# Patient Record
Sex: Male | Born: 1950 | Race: White | Hispanic: No
Health system: Southern US, Community
[De-identification: ages and names within clinical notes are randomized; demographics above are authoritative.]

## PROBLEM LIST (undated history)

## (undated) DIAGNOSIS — E7800 Pure hypercholesterolemia, unspecified: Secondary | ICD-10-CM

## (undated) DIAGNOSIS — R252 Cramp and spasm: Secondary | ICD-10-CM

## (undated) DIAGNOSIS — I10 Essential (primary) hypertension: Secondary | ICD-10-CM

## (undated) DIAGNOSIS — F329 Major depressive disorder, single episode, unspecified: Secondary | ICD-10-CM

## (undated) DIAGNOSIS — J189 Pneumonia, unspecified organism: Secondary | ICD-10-CM

## (undated) DIAGNOSIS — F419 Anxiety disorder, unspecified: Secondary | ICD-10-CM

## (undated) DIAGNOSIS — K7460 Unspecified cirrhosis of liver: Secondary | ICD-10-CM

## (undated) DIAGNOSIS — F32A Depression, unspecified: Secondary | ICD-10-CM

## (undated) DIAGNOSIS — E119 Type 2 diabetes mellitus without complications: Secondary | ICD-10-CM

## (undated) DIAGNOSIS — K7581 Nonalcoholic steatohepatitis (NASH): Secondary | ICD-10-CM

## (undated) DIAGNOSIS — B159 Hepatitis A without hepatic coma: Secondary | ICD-10-CM

## (undated) DIAGNOSIS — N390 Urinary tract infection, site not specified: Secondary | ICD-10-CM

## (undated) HISTORY — PX: TONSILLECTOMY: SUR1361

## (undated) HISTORY — PX: CARDIAC CATHETERIZATION: SHX172

## (undated) HISTORY — PX: FIXATION KYPHOPLASTY LUMBAR SPINE: SHX1642

---

## 1969-07-20 DIAGNOSIS — B159 Hepatitis A without hepatic coma: Secondary | ICD-10-CM

## 1969-07-20 HISTORY — DX: Hepatitis a without hepatic coma: B15.9

## 1983-07-21 HISTORY — PX: VASECTOMY: SHX75

## 1998-09-27 ENCOUNTER — Ambulatory Visit (HOSPITAL_COMMUNITY): Admission: RE | Admit: 1998-09-27 | Discharge: 1998-09-27 | Payer: Self-pay | Admitting: Urology

## 2002-07-28 ENCOUNTER — Ambulatory Visit (HOSPITAL_COMMUNITY): Admission: RE | Admit: 2002-07-28 | Discharge: 2002-07-28 | Payer: Self-pay | Admitting: Cardiology

## 2002-08-03 ENCOUNTER — Ambulatory Visit (HOSPITAL_BASED_OUTPATIENT_CLINIC_OR_DEPARTMENT_OTHER): Admission: RE | Admit: 2002-08-03 | Discharge: 2002-08-03 | Payer: Self-pay | Admitting: Cardiology

## 2007-08-22 ENCOUNTER — Ambulatory Visit: Payer: Self-pay | Admitting: *Deleted

## 2007-11-09 ENCOUNTER — Ambulatory Visit: Payer: Self-pay | Admitting: Internal Medicine

## 2007-11-09 ENCOUNTER — Encounter (INDEPENDENT_AMBULATORY_CARE_PROVIDER_SITE_OTHER): Payer: Self-pay | Admitting: Family Medicine

## 2007-11-09 LAB — CONVERTED CEMR LAB
ALT: 32 units/L (ref 0–53)
AST: 30 units/L (ref 0–37)
Calcium: 9.3 mg/dL (ref 8.4–10.5)
Chloride: 107 meq/L (ref 96–112)
Creatinine, Ser: 0.95 mg/dL (ref 0.40–1.50)
PSA: 0.24 ng/mL (ref 0.10–4.00)
Sodium: 140 meq/L (ref 135–145)
TSH: 1.34 microintl units/mL (ref 0.350–5.50)
Total CHOL/HDL Ratio: 4.7
VLDL: 20 mg/dL (ref 0–40)

## 2007-12-07 ENCOUNTER — Ambulatory Visit: Payer: Self-pay | Admitting: Internal Medicine

## 2008-01-18 ENCOUNTER — Encounter (INDEPENDENT_AMBULATORY_CARE_PROVIDER_SITE_OTHER): Payer: Self-pay | Admitting: Family Medicine

## 2008-01-18 ENCOUNTER — Ambulatory Visit: Payer: Self-pay | Admitting: Internal Medicine

## 2008-01-18 LAB — CONVERTED CEMR LAB
ALT: 36 units/L (ref 0–53)
AST: 32 units/L (ref 0–37)
Chloride: 109 meq/L (ref 96–112)
Creatinine, Ser: 1.12 mg/dL (ref 0.40–1.50)
LDL Cholesterol: 108 mg/dL — ABNORMAL HIGH (ref 0–99)
Total Bilirubin: 0.7 mg/dL (ref 0.3–1.2)
Total CHOL/HDL Ratio: 4.2
VLDL: 23 mg/dL (ref 0–40)

## 2008-03-12 ENCOUNTER — Ambulatory Visit: Payer: Self-pay | Admitting: Internal Medicine

## 2008-03-13 ENCOUNTER — Ambulatory Visit: Payer: Self-pay | Admitting: Internal Medicine

## 2008-03-13 ENCOUNTER — Encounter (INDEPENDENT_AMBULATORY_CARE_PROVIDER_SITE_OTHER): Payer: Self-pay | Admitting: Family Medicine

## 2008-03-13 LAB — CONVERTED CEMR LAB: Microalb, Ur: 2.29 mg/dL — ABNORMAL HIGH (ref 0.00–1.89)

## 2008-04-18 ENCOUNTER — Encounter (INDEPENDENT_AMBULATORY_CARE_PROVIDER_SITE_OTHER): Payer: Self-pay | Admitting: Family Medicine

## 2008-04-18 ENCOUNTER — Ambulatory Visit: Payer: Self-pay | Admitting: Internal Medicine

## 2008-04-18 LAB — CONVERTED CEMR LAB
Albumin: 4.9 g/dL (ref 3.5–5.2)
Alkaline Phosphatase: 74 units/L (ref 39–117)
CO2: 21 meq/L (ref 19–32)
Eosinophils Absolute: 0.2 10*3/uL (ref 0.0–0.7)
Glucose, Bld: 122 mg/dL — ABNORMAL HIGH (ref 70–99)
LDL Cholesterol: 165 mg/dL — ABNORMAL HIGH (ref 0–99)
Lymphocytes Relative: 26 % (ref 12–46)
Lymphs Abs: 1.5 10*3/uL (ref 0.7–4.0)
Microalb, Ur: 2.39 mg/dL — ABNORMAL HIGH (ref 0.00–1.89)
Neutrophils Relative %: 60 % (ref 43–77)
PSA: 0.38 ng/mL (ref 0.10–4.00)
Platelets: 112 10*3/uL — ABNORMAL LOW (ref 150–400)
Potassium: 4.8 meq/L (ref 3.5–5.3)
Sodium: 137 meq/L (ref 135–145)
Total Protein: 8.1 g/dL (ref 6.0–8.3)
Triglycerides: 209 mg/dL — ABNORMAL HIGH (ref <150)
WBC: 5.6 10*3/uL (ref 4.0–10.5)

## 2008-04-19 ENCOUNTER — Encounter (INDEPENDENT_AMBULATORY_CARE_PROVIDER_SITE_OTHER): Payer: Self-pay | Admitting: Family Medicine

## 2008-04-19 LAB — CONVERTED CEMR LAB: Free T4: 1.25 ng/dL (ref 0.89–1.80)

## 2008-04-23 ENCOUNTER — Ambulatory Visit: Payer: Self-pay | Admitting: Internal Medicine

## 2008-05-22 ENCOUNTER — Ambulatory Visit: Payer: Self-pay | Admitting: Internal Medicine

## 2008-08-13 ENCOUNTER — Ambulatory Visit: Payer: Self-pay | Admitting: Internal Medicine

## 2008-08-29 ENCOUNTER — Encounter (INDEPENDENT_AMBULATORY_CARE_PROVIDER_SITE_OTHER): Payer: Self-pay | Admitting: Adult Health

## 2008-08-29 ENCOUNTER — Ambulatory Visit: Payer: Self-pay | Admitting: Internal Medicine

## 2008-08-29 LAB — CONVERTED CEMR LAB
ALT: 27 units/L (ref 0–53)
AST: 30 units/L (ref 0–37)
Alkaline Phosphatase: 81 units/L (ref 39–117)
Calcium: 9.3 mg/dL (ref 8.4–10.5)
Chloride: 103 meq/L (ref 96–112)
Creatinine, Ser: 1.74 mg/dL — ABNORMAL HIGH (ref 0.40–1.50)
LDL Cholesterol: 107 mg/dL — ABNORMAL HIGH (ref 0–99)
T3 Uptake Ratio: 36.6 % (ref 22.5–37.0)
T4, Total: 5.4 ug/dL (ref 5.0–12.5)
Total Bilirubin: 1 mg/dL (ref 0.3–1.2)
Total CHOL/HDL Ratio: 3.9
VLDL: 28 mg/dL (ref 0–40)

## 2008-09-10 ENCOUNTER — Ambulatory Visit: Payer: Self-pay | Admitting: Internal Medicine

## 2008-09-10 ENCOUNTER — Encounter (INDEPENDENT_AMBULATORY_CARE_PROVIDER_SITE_OTHER): Payer: Self-pay | Admitting: Adult Health

## 2008-09-10 LAB — CONVERTED CEMR LAB
BUN: 23 mg/dL (ref 6–23)
CO2: 21 meq/L (ref 19–32)
Calcium: 9 mg/dL (ref 8.4–10.5)
Chloride: 104 meq/L (ref 96–112)
Creatinine, Ser: 1.27 mg/dL (ref 0.40–1.50)
Glucose, Bld: 128 mg/dL — ABNORMAL HIGH (ref 70–99)
Potassium: 5.2 meq/L (ref 3.5–5.3)
Sodium: 141 meq/L (ref 135–145)

## 2008-12-09 ENCOUNTER — Emergency Department (HOSPITAL_COMMUNITY): Admission: EM | Admit: 2008-12-09 | Discharge: 2008-12-09 | Payer: Self-pay | Admitting: Emergency Medicine

## 2008-12-11 ENCOUNTER — Ambulatory Visit: Payer: Self-pay | Admitting: Internal Medicine

## 2008-12-25 ENCOUNTER — Ambulatory Visit: Payer: Self-pay | Admitting: Internal Medicine

## 2009-01-01 ENCOUNTER — Ambulatory Visit: Payer: Self-pay | Admitting: Internal Medicine

## 2009-01-20 ENCOUNTER — Emergency Department (HOSPITAL_COMMUNITY): Admission: EM | Admit: 2009-01-20 | Discharge: 2009-01-20 | Payer: Self-pay | Admitting: Emergency Medicine

## 2009-01-22 ENCOUNTER — Ambulatory Visit: Payer: Self-pay | Admitting: Internal Medicine

## 2009-01-24 ENCOUNTER — Ambulatory Visit: Payer: Self-pay | Admitting: Internal Medicine

## 2009-05-13 ENCOUNTER — Ambulatory Visit: Payer: Self-pay | Admitting: Internal Medicine

## 2009-11-13 ENCOUNTER — Ambulatory Visit: Payer: Self-pay | Admitting: Family Medicine

## 2009-11-13 ENCOUNTER — Encounter (INDEPENDENT_AMBULATORY_CARE_PROVIDER_SITE_OTHER): Payer: Self-pay | Admitting: Adult Health

## 2009-11-13 LAB — CONVERTED CEMR LAB
ALT: 19 units/L (ref 0–53)
Alkaline Phosphatase: 77 units/L (ref 39–117)
Creatinine, Ser: 1.21 mg/dL (ref 0.40–1.50)
LDL Cholesterol: 184 mg/dL — ABNORMAL HIGH (ref 0–99)
Microalb, Ur: 1 mg/dL (ref 0.00–1.89)
Sodium: 135 meq/L (ref 135–145)
Total Bilirubin: 0.7 mg/dL (ref 0.3–1.2)
Total CHOL/HDL Ratio: 6.7
Total Protein: 7.2 g/dL (ref 6.0–8.3)
Triglycerides: 211 mg/dL — ABNORMAL HIGH (ref <150)
VLDL: 42 mg/dL — ABNORMAL HIGH (ref 0–40)

## 2010-01-21 ENCOUNTER — Ambulatory Visit: Payer: Self-pay | Admitting: Internal Medicine

## 2010-10-26 LAB — POCT I-STAT, CHEM 8
BUN: 32 mg/dL — ABNORMAL HIGH (ref 6–23)
Calcium, Ion: 1.11 mmol/L — ABNORMAL LOW (ref 1.12–1.32)
Chloride: 111 mEq/L (ref 96–112)
Glucose, Bld: 97 mg/dL (ref 70–99)
TCO2: 20 mmol/L (ref 0–100)

## 2010-10-26 LAB — CK: Total CK: 401 U/L — ABNORMAL HIGH (ref 7–232)

## 2011-06-08 ENCOUNTER — Emergency Department (HOSPITAL_COMMUNITY): Payer: Self-pay

## 2011-06-08 ENCOUNTER — Emergency Department (HOSPITAL_COMMUNITY)
Admission: EM | Admit: 2011-06-08 | Discharge: 2011-06-08 | Disposition: A | Discharge status: Home / Self Care | Payer: Self-pay | Attending: Emergency Medicine | Admitting: Emergency Medicine

## 2011-06-08 ENCOUNTER — Encounter: Payer: Self-pay | Admitting: *Deleted

## 2011-06-08 DIAGNOSIS — R188 Other ascites: Secondary | ICD-10-CM | POA: Insufficient documentation

## 2011-06-08 DIAGNOSIS — M4854XA Collapsed vertebra, not elsewhere classified, thoracic region, initial encounter for fracture: Secondary | ICD-10-CM

## 2011-06-08 DIAGNOSIS — M8448XA Pathological fracture, other site, initial encounter for fracture: Secondary | ICD-10-CM | POA: Insufficient documentation

## 2011-06-08 DIAGNOSIS — N390 Urinary tract infection, site not specified: Secondary | ICD-10-CM | POA: Insufficient documentation

## 2011-06-08 DIAGNOSIS — I10 Essential (primary) hypertension: Secondary | ICD-10-CM | POA: Insufficient documentation

## 2011-06-08 DIAGNOSIS — K5730 Diverticulosis of large intestine without perforation or abscess without bleeding: Secondary | ICD-10-CM | POA: Insufficient documentation

## 2011-06-08 DIAGNOSIS — K4090 Unilateral inguinal hernia, without obstruction or gangrene, not specified as recurrent: Secondary | ICD-10-CM | POA: Insufficient documentation

## 2011-06-08 DIAGNOSIS — R161 Splenomegaly, not elsewhere classified: Secondary | ICD-10-CM | POA: Insufficient documentation

## 2011-06-08 DIAGNOSIS — K7460 Unspecified cirrhosis of liver: Secondary | ICD-10-CM | POA: Insufficient documentation

## 2011-06-08 HISTORY — DX: Pure hypercholesterolemia, unspecified: E78.00

## 2011-06-08 HISTORY — DX: Essential (primary) hypertension: I10

## 2011-06-08 LAB — CBC
HCT: 34.6 % — ABNORMAL LOW (ref 39.0–52.0)
Hemoglobin: 11.6 g/dL — ABNORMAL LOW (ref 13.0–17.0)
MCH: 29.7 pg (ref 26.0–34.0)
MCHC: 33.5 g/dL (ref 30.0–36.0)
MCV: 88.7 fL (ref 78.0–100.0)
RDW: 14.2 % (ref 11.5–15.5)

## 2011-06-08 LAB — POCT I-STAT, CHEM 8
Calcium, Ion: 1.18 mmol/L (ref 1.12–1.32)
Creatinine, Ser: 0.9 mg/dL (ref 0.50–1.35)
Glucose, Bld: 149 mg/dL — ABNORMAL HIGH (ref 70–99)
HCT: 36 % — ABNORMAL LOW (ref 39.0–52.0)
Hemoglobin: 12.2 g/dL — ABNORMAL LOW (ref 13.0–17.0)
TCO2: 24 mmol/L (ref 0–100)

## 2011-06-08 LAB — URINALYSIS, ROUTINE W REFLEX MICROSCOPIC
Glucose, UA: >1000 mg/dL — AB
Protein, ur: NEGATIVE mg/dL
pH: 5.5 (ref 5.0–8.0)

## 2011-06-08 LAB — HEPATIC FUNCTION PANEL
AST: 32 U/L (ref 0–37)
Bilirubin, Direct: 0.3 mg/dL (ref 0.0–0.3)
Indirect Bilirubin: 0.5 mg/dL (ref 0.3–0.9)
Total Bilirubin: 0.8 mg/dL (ref 0.3–1.2)

## 2011-06-08 LAB — URINE MICROSCOPIC-ADD ON

## 2011-06-08 MED ORDER — CIPROFLOXACIN HCL 500 MG PO TABS: 500.0000 mg | ORAL_TABLET | Freq: Two times a day (BID) | ORAL | Status: DC

## 2011-06-08 MED ORDER — DIAZEPAM 5 MG PO TABS: 5.0000 mg | ORAL_TABLET | Freq: Three times a day (TID) | ORAL | Status: AC | PRN

## 2011-06-08 MED ORDER — HYDROCODONE-ACETAMINOPHEN 5-500 MG PO TABS: 1.0000 | ORAL_TABLET | ORAL | Status: AC | PRN

## 2011-06-08 MED ORDER — DEXTROSE 5 % IV SOLN
1.0000 g | Freq: Once | INTRAVENOUS | Status: AC
  Administered 2011-06-08: 1 g via INTRAVENOUS
  Filled 2011-06-08: qty 10

## 2011-06-08 MED ORDER — DIAZEPAM 5 MG PO TABS
5.0000 mg | ORAL_TABLET | Freq: Once | ORAL | Status: AC
  Administered 2011-06-08: 5 mg via ORAL
  Filled 2011-06-08: qty 1

## 2011-06-08 MED ORDER — HYDROMORPHONE HCL PF 1 MG/ML IJ SOLN
1.0000 mg | Freq: Once | INTRAMUSCULAR | Status: AC
  Administered 2011-06-08: 1 mg via INTRAMUSCULAR

## 2011-06-08 MED ORDER — HYDROMORPHONE HCL PF 2 MG/ML IJ SOLN
INTRAMUSCULAR | Status: AC
  Filled 2011-06-08: qty 1

## 2011-06-08 NOTE — ED Notes (Signed)
Pt up got dressed and walking around with no problems.

## 2011-06-08 NOTE — ED Notes (Signed)
Pt waiting for PA.

## 2011-06-08 NOTE — ED Notes (Signed)
Patient transported to X-ray 

## 2011-06-08 NOTE — ED Provider Notes (Addendum)
History     CSN: [id number] Arrival date & time: 06/08/2011  1:17 PM   First MD Initiated Contact with Patient 06/08/11 1507      Chief Complaint  Patient presents with  . Back Pain    Pt reports feeling sharp pains in back that began last tuesday. Pt reports pain is best when laying flat on back. Pt denies exact or recent injury.     (Consider location/radiation/quality/duration/timing/severity/associated sxs/prior treatment) Patient is a 60 y.o. male presenting with back pain. The history is provided by the patient.  Back Pain  This is a new problem. The current episode started more than 2 days ago. The problem occurs constantly. The problem has been gradually worsening. The pain is associated with no known injury. The pain is present in the lumbar spine. The quality of the pain is described as stabbing. The pain does not radiate. The pain is at a severity of 9/10. The pain is moderate. The symptoms are aggravated by bending, twisting and certain positions. The pain is the same all the time. Associated symptoms include dysuria. Pertinent negatives include no chest pain, no fever, no numbness, no headaches, no abdominal pain, no abdominal swelling, no bowel incontinence, no bladder incontinence, no leg pain, no paresthesias, no paresis, no tingling and no weakness. He has tried nothing for the symptoms.  Pt states pain gradually worsening over a week. States pain with any movement, coughing, sneezing. States took ibuprofen with no relief. States now pain with walking and getting around. Denies fever, chills, pain radiation, numbness weakness in extremeties, denies urinary retention/incontinence, denies bowel incontinence. No back problems in the past.  Past Medical History  Diagnosis Date  . Diabetes mellitus   . Hypertension   . High cholesterol     Past Surgical History  Procedure Date  . Tonsillectomy     History reviewed. No pertinent family history.  History  Substance Use  Topics  . Smoking status: Current Some Day Smoker    Types: Cigarettes  . Smokeless tobacco: Not on file  . Alcohol Use: No      Review of Systems  Constitutional: Negative for fever and chills.  HENT: Negative.   Eyes: Negative.   Respiratory: Negative.   Cardiovascular: Negative for chest pain and leg swelling.  Gastrointestinal: Negative for abdominal pain, rectal pain and bowel incontinence.  Genitourinary: Positive for dysuria. Negative for bladder incontinence.  Musculoskeletal: Positive for back pain.  Skin: Negative.   Neurological: Negative for tingling, weakness, numbness, headaches and paresthesias.  Psychiatric/Behavioral: Negative.     Allergies  Review of patient's allergies indicates no known allergies.  Home Medications   Current Outpatient Rx  Name Route Sig Dispense Refill  . ASPIRIN EC 81 MG PO TBEC Oral Take 81 mg by mouth daily.      . IBUPROFEN 200 MG PO TABS Oral Take 1,000 mg by mouth 2 (two) times daily as needed. Back pain     . IMIPRAMINE HCL 50 MG PO TABS Oral Take 200 mg by mouth at bedtime.      Marland Kitchen NAPROXEN SODIUM 220 MG PO TABS Oral Take 220 mg by mouth 2 (two) times daily with a meal. For back pain     . TELMISARTAN 80 MG PO TABS Oral Take 80 mg by mouth daily.       BP 142/80  Pulse 87  Temp(Src) 98.8 F (37.1 C) (Oral)  Resp 20  Wt 205 lb (92.987 kg)  SpO2 99%  Physical Exam  Nursing note and vitals reviewed. Constitutional: He is oriented to person, place, and time. He appears well-developed and well-nourished. No distress.  HENT:  Head: Normocephalic and atraumatic.  Eyes: Conjunctivae are normal.  Neck: Neck supple.  Cardiovascular: Normal rate, regular rhythm and normal heart sounds.   Pulmonary/Chest: Effort normal and breath sounds normal. No respiratory distress.  Abdominal: Soft. Bowel sounds are normal. He exhibits no distension. There is no tenderness. There is no rebound.  Genitourinary: Rectum normal and prostate  normal.       Normal sphincter tone, prostate non tender  Musculoskeletal: Normal range of motion. He exhibits no edema.       Lumbar spine tender to palpation. No swelling, step offs. NO paraspinal tenderness with palpation. No pain with  straight leg raise  Neurological: He is alert and oriented to person, place, and time. He has normal reflexes. No cranial nerve deficit.  Skin: Skin is warm and dry.  Psychiatric: He has a normal mood and affect.    ED Course  Procedures (including critical care time)  Pt with severe lower back pain. Difficulty moving. Will get labs, UA, x-ray.  Results for orders placed during the hospital encounter of 06/08/11  URINALYSIS, ROUTINE W REFLEX MICROSCOPIC      Component Value Range   Color, Urine AMBER (*) YELLOW    Appearance CLOUDY (*) CLEAR    Specific Gravity, Urine 1.029  1.005 - 1.030    pH 5.5  5.0 - 8.0    Glucose, UA >1000 (*) NEGATIVE (mg/dL)   Hgb urine dipstick NEGATIVE  NEGATIVE    Bilirubin Urine SMALL (*) NEGATIVE    Ketones, ur TRACE (*) NEGATIVE (mg/dL)   Protein, ur NEGATIVE  NEGATIVE (mg/dL)   Urobilinogen, UA 4.0 (*) 0.0 - 1.0 (mg/dL)   Nitrite NEGATIVE  NEGATIVE    Leukocytes, UA SMALL (*) NEGATIVE   CBC      Component Value Range   WBC 5.0  4.0 - 10.5 (K/uL)   RBC 3.90 (*) 4.22 - 5.81 (MIL/uL)   Hemoglobin 11.6 (*) 13.0 - 17.0 (g/dL)   HCT 16.1 (*) 09.6 - 52.0 (%)   MCV 88.7  78.0 - 100.0 (fL)   MCH 29.7  26.0 - 34.0 (pg)   MCHC 33.5  30.0 - 36.0 (g/dL)   RDW 04.5  40.9 - 81.1 (%)   Platelets 75 (*) 150 - 400 (K/uL)  POCT I-STAT, CHEM 8      Component Value Range   Sodium 142  135 - 145 (mEq/L)   Potassium 4.1  3.5 - 5.1 (mEq/L)   Chloride 107  96 - 112 (mEq/L)   BUN 22  6 - 23 (mg/dL)   Creatinine, Ser 9.14  0.50 - 1.35 (mg/dL)   Glucose, Bld 782 (*) 70 - 99 (mg/dL)   Calcium, Ion 9.56  2.13 - 1.32 (mmol/L)   TCO2 24  0 - 100 (mmol/L)   Hemoglobin 12.2 (*) 13.0 - 17.0 (g/dL)   HCT 08.6 (*) 57.8 - 52.0 (%)    URINE MICROSCOPIC-ADD ON      Component Value Range   WBC, UA 11-20  <3 (WBC/hpf)   RBC / HPF 0-2  <3 (RBC/hpf)   Bacteria, UA MANY (*) RARE    Crystals CA OXALATE CRYSTALS (*) NEGATIVE   HEPATIC FUNCTION PANEL      Component Value Range   Total Protein 7.2  6.0 - 8.3 (g/dL)   Albumin 3.2 (*) 3.5 - 5.2 (g/dL)  AST 32  0 - 37 (U/L)   ALT 18  0 - 53 (U/L)   Alkaline Phosphatase 100  39 - 117 (U/L)   Total Bilirubin 0.8  0.3 - 1.2 (mg/dL)   Bilirubin, Direct 0.3  0.0 - 0.3 (mg/dL)   Indirect Bilirubin 0.5  0.3 - 0.9 (mg/dL)   Ct Abdomen Pelvis Wo Contrast  06/08/2011  *RADIOLOGY REPORT*  Clinical Data: Back pain, UTI  CT ABDOMEN AND PELVIS WITHOUT CONTRAST  Technique:  Multidetector CT imaging of the abdomen and pelvis was performed following the standard protocol without intravenous contrast.  Comparison: None.  Findings: Minimal basilar atelectasis / scarring scattered bilaterally.  Normal heart size.  No pericardial or pleural effusion.  Slight symmetric gynecomastia, image 8.  Abdomen: Irregular nodular liver contour suspicious for underlying hepatic cirrhosis.  Small amount of perihepatic right upper quadrant ascites.  Benign-appearing calcification adjacent to the gallbladder and appears outside the biliary tree, possibly vascular.  Slight distention of the gallbladder.  No definite biliary dilatation or obstruction.  There is diffuse mesenteric vascular congestion/mild edema.  In the left upper quadrant, there are tortuous vascular structures in the splenic hilum and along the GE junction consistent with varices.  These findings are likely secondary to chronic portal hypertension.  Spleen appears enlarged roughly measuring 15 cm.  Pancreas is diffusely atrophic.  No pancreatic ductal dilatation identified.  Visualized adrenal glands are symmetric.  Kidneys demonstrate no obstruction, urinary tract calculus, hydronephrosis or obstructive uropathy.  Exam is limited without IV or oral  contrast.  No abdominal of fluid collection, abscess, hemorrhage, or free air.  No bowel obstruction of pattern, dilatation, ileus.  Retained stool throughout the colon.  Aortic atherosclerosis noted.  No aneurysm.  Pelvis:  Pelvic dependent ascites noted superior to the bladder. Bladder is decompressed.  No distal ureteral calculus or UVJ abnormality.  Scattered mild colonic diverticulosis.  No pelvic abscess, hemorrhage, adenopathy, or inguinal abnormality. Bilateral inguinal hernias noted containing fat and ascites.  Degenerative changes of the spine.  Chronic compression fracture at T12.  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  Splenomegaly  Diverticulosis  Dependent pelvic ascites as well.  Bilateral inguinal hernias containing fat and ascitic fluid.  Original Report Authenticated By: Judie Petit. Ruel Favors, M.D.   Dg Lumbar Spine Complete  06/08/2011  *RADIOLOGY REPORT*  Clinical Data: Severe low back pain, no fall or injury  LUMBAR SPINE - COMPLETE 4+ VIEW  Comparison: None.  Findings: There is an anterior compression deformity of the T12 vertebral body with approximately 20% loss vertebral body height. No retropulsion.  No subluxation.  There is endplate osteophytosis at multiple levels in the lumbar spine.  Oblique projections demonstrate no pars fracture.  IMPRESSION:  1.  Wedge compression fracture the T12-2 body of indeterminate age. 2.  Multiple levels of disc osteophytic disease.  Original Report Authenticated By: Genevive Bi, M.D.     CT scan was obtained due to oxalate crystals, flank pain, UA infected. Rocephin given IV in ER. Suspect pain may be related to vertebral fracture and some component of UTI vs prostatitis. Urine cultures sent. Will treat with cipro for two weeks. Will give pain medications and follow up for further treatment. Pt ambulated in the hallway. Pain  improved.   Results and importance of close follow up discussed with pt. Pt's vs are normal. Pt's friend is here to take him home. Pt non toxic.  MDM          Lottie Mussel, PA 06/09/11 0141  Lottie Mussel, PA 08/22/11 1539

## 2011-06-09 NOTE — ED Provider Notes (Signed)
Medical screening examination/treatment/procedure(s) were performed by non-physician practitioner and as supervising physician I was immediately available for consultation/collaboration.   Lyanne Co, MD 06/09/11 [phone]

## 2011-06-10 ENCOUNTER — Emergency Department (HOSPITAL_COMMUNITY)
Admission: EM | Admit: 2011-06-10 | Discharge: 2011-06-10 | Disposition: A | Discharge status: Home / Self Care | Payer: Self-pay | Attending: Emergency Medicine | Admitting: Emergency Medicine

## 2011-06-10 ENCOUNTER — Encounter (HOSPITAL_COMMUNITY): Payer: Self-pay

## 2011-06-10 DIAGNOSIS — Z7982 Long term (current) use of aspirin: Secondary | ICD-10-CM | POA: Insufficient documentation

## 2011-06-10 DIAGNOSIS — F17200 Nicotine dependence, unspecified, uncomplicated: Secondary | ICD-10-CM | POA: Insufficient documentation

## 2011-06-10 DIAGNOSIS — S32009A Unspecified fracture of unspecified lumbar vertebra, initial encounter for closed fracture: Secondary | ICD-10-CM | POA: Insufficient documentation

## 2011-06-10 DIAGNOSIS — E7800 Pure hypercholesterolemia, unspecified: Secondary | ICD-10-CM | POA: Insufficient documentation

## 2011-06-10 DIAGNOSIS — S32000A Wedge compression fracture of unspecified lumbar vertebra, initial encounter for closed fracture: Secondary | ICD-10-CM

## 2011-06-10 DIAGNOSIS — Z79899 Other long term (current) drug therapy: Secondary | ICD-10-CM | POA: Insufficient documentation

## 2011-06-10 DIAGNOSIS — I10 Essential (primary) hypertension: Secondary | ICD-10-CM | POA: Insufficient documentation

## 2011-06-10 DIAGNOSIS — E119 Type 2 diabetes mellitus without complications: Secondary | ICD-10-CM | POA: Insufficient documentation

## 2011-06-10 DIAGNOSIS — X58XXXA Exposure to other specified factors, initial encounter: Secondary | ICD-10-CM | POA: Insufficient documentation

## 2011-06-10 LAB — URINE CULTURE
Colony Count: >=100000
Culture  Setup Time: 201211200108

## 2011-06-10 MED ORDER — OXYCODONE-ACETAMINOPHEN 5-325 MG PO TABS: 1.0000 | ORAL_TABLET | ORAL | Status: DC | PRN

## 2011-06-10 NOTE — ED Provider Notes (Signed)
Medical screening examination/treatment/procedure(s) were performed by non-physician practitioner and as supervising physician I was immediately available for consultation/collaboration.  Doug Sou, MD 06/10/11 1736

## 2011-06-10 NOTE — ED Notes (Signed)
Pt reports fall 3 weeks ago. Concerned that he may not have enough pain meds to last until Tuesday-when he will be seen be ortho

## 2011-06-10 NOTE — ED Provider Notes (Signed)
History     CSN: [id number] Arrival date & time: 06/10/2011 11:41 AM   First MD Initiated Contact with Patient 06/10/11 1153      No chief complaint on file.   (Consider location/radiation/quality/duration/timing/severity/associated sxs/prior treatment) Patient is a 60 y.o. male presenting with back pain. The history is provided by the patient.  Back Pain  Pertinent negatives include no chest pain, no fever, no numbness, no abdominal pain and no weakness.   Patient presents with back pain. He was diagnosed with a T12 -L2 body wedge compression fracture on November 19. He was given 20 tablets of Vicodin at that time and instructed to followup with a spine specialist. He states that he has made an appointment, but due to the Thanksgiving holiday, was unable to be scheduled until next Wednesday. The physician whom he is scheduled to followup with did not feel comfortable refilling his pain medication without having seen him first, so he was instructed to return to the ED if he should need additional pain medication. He states that he has been taking the Vicodin around-the-clock for his pain, which has been "using it off" somewhat, but has not totally alleviated his pain, and he has now run out of his medication. He is able to ambulate, but this is painful. He is most comfortable sitting completely erect in a chair. Denies weakness or tingling in his legs, saddle anesthesia, fecal incontinence, urinary retention.  He additionally was treated for a UTI at his November 19 visit with Rocephin IV in the department and a prescription for Cipro. He has been tolerating the Cipro well and has been taking it as prescribed. Review of the record indicates that the culture has grown out Escherichia coli, but sensitivities are not currently available. Patient denies fevers, chills, urinary symptoms at this time. He is not experiencing any abdominal pain.  Past Medical History  Diagnosis Date  . Diabetes  mellitus   . Hypertension   . High cholesterol     Past Surgical History  Procedure Date  . Tonsillectomy     No family history on file.  History  Substance Use Topics  . Smoking status: Current Some Day Smoker    Types: Cigarettes  . Smokeless tobacco: Not on file  . Alcohol Use: No      Review of Systems  Constitutional: Negative.  Negative for fever, chills, activity change and appetite change.  HENT: Negative.   Eyes: Negative.   Respiratory: Negative for cough and shortness of breath.   Cardiovascular: Negative for chest pain, palpitations and leg swelling.  Gastrointestinal: Negative for nausea, vomiting, abdominal pain and diarrhea.  Genitourinary: Negative for urgency, flank pain, decreased urine volume and difficulty urinating.  Musculoskeletal: Positive for back pain.  Skin: Negative.   Neurological: Negative for dizziness, weakness and numbness.    Allergies  Review of patient's allergies indicates no known allergies.  Home Medications   Current Outpatient Rx  Name Route Sig Dispense Refill  . ASPIRIN EC 81 MG PO TBEC Oral Take 81 mg by mouth daily.      Marland Kitchen CIPROFLOXACIN HCL 500 MG PO TABS Oral Take 1 tablet (500 mg total) by mouth 2 (two) times daily. 28 tablet 0  . DIAZEPAM 5 MG PO TABS Oral Take 1 tablet (5 mg total) by mouth every 8 (eight) hours as needed for anxiety. 15 tablet 0  . HYDROCODONE-ACETAMINOPHEN 5-500 MG PO TABS Oral Take 1 tablet by mouth every 4 (four) hours as needed for pain. 20  tablet 0  . IBUPROFEN 200 MG PO TABS Oral Take 1,000 mg by mouth 2 (two) times daily as needed. Back pain     . IMIPRAMINE HCL 50 MG PO TABS Oral Take 200 mg by mouth at bedtime.      Marland Kitchen NAPROXEN SODIUM 220 MG PO TABS Oral Take 220 mg by mouth 2 (two) times daily with a meal. For back pain     . TELMISARTAN 80 MG PO TABS Oral Take 80 mg by mouth daily.       BP 152/92  Pulse 82  Temp(Src) 98.6 F (37 C) (Oral)  Resp 18  SpO2 100%  Physical Exam    Nursing note and vitals reviewed. Constitutional: He is oriented to person, place, and time. He appears well-developed and well-nourished. No distress.       Patient sitting upright in a hard backed chair. Appears mildly uncomfortable.  HENT:  Head: Normocephalic and atraumatic.  Right Ear: External ear normal.  Left Ear: External ear normal.  Eyes: Conjunctivae are normal. Pupils are equal, round, and reactive to light.  Neck: Normal range of motion.  Cardiovascular: Normal rate, regular rhythm and normal heart sounds.   Pulmonary/Chest: Effort normal and breath sounds normal.  Abdominal: Soft. Bowel sounds are normal. There is no tenderness.  Musculoskeletal:       Spine exam: No palpable crepitus, step-off, deformity. Patient with some midline tenderness in the low back, around the upper lumbar spine, as well as paravertebral tenderness. Lower extremities bilaterally neurovascularly intact with sensory intact to light touch. Good pedal pulses. Patient able to ambulate.  Neurological: He is alert and oriented to person, place, and time.  Skin: Skin is warm and dry. He is not diaphoretic.  Psychiatric: He has a normal mood and affect.    ED Course  Procedures (including critical care time)  Labs Reviewed - No data to display Ct Abdomen Pelvis Wo Contrast  06/08/2011  *RADIOLOGY REPORT*  Clinical Data: Back pain, UTI  CT ABDOMEN AND PELVIS WITHOUT CONTRAST  Technique:  Multidetector CT imaging of the abdomen and pelvis was performed following the standard protocol without intravenous contrast.  Comparison: None.  Findings: Minimal basilar atelectasis / scarring scattered bilaterally.  Normal heart size.  No pericardial or pleural effusion.  Slight symmetric gynecomastia, image 8.  Abdomen: Irregular nodular liver contour suspicious for underlying hepatic cirrhosis.  Small amount of perihepatic right upper quadrant ascites.  Benign-appearing calcification adjacent to the gallbladder and  appears outside the biliary tree, possibly vascular.  Slight distention of the gallbladder.  No definite biliary dilatation or obstruction.  There is diffuse mesenteric vascular congestion/mild edema.  In the left upper quadrant, there are tortuous vascular structures in the splenic hilum and along the GE junction consistent with varices.  These findings are likely secondary to chronic portal hypertension.  Spleen appears enlarged roughly measuring 15 cm.  Pancreas is diffusely atrophic.  No pancreatic ductal dilatation identified.  Visualized adrenal glands are symmetric.  Kidneys demonstrate no obstruction, urinary tract calculus, hydronephrosis or obstructive uropathy.  Exam is limited without IV or oral contrast.  No abdominal of fluid collection, abscess, hemorrhage, or free air.  No bowel obstruction of pattern, dilatation, ileus.  Retained stool throughout the colon.  Aortic atherosclerosis noted.  No aneurysm.  Pelvis:  Pelvic dependent ascites noted superior to the bladder. Bladder is decompressed.  No distal ureteral calculus or UVJ abnormality.  Scattered mild colonic diverticulosis.  No pelvic abscess, hemorrhage, adenopathy, or inguinal  abnormality. Bilateral inguinal hernias noted containing fat and ascites.  Degenerative changes of the spine.  Chronic compression fracture at T12.  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  Splenomegaly  Diverticulosis  Dependent pelvic ascites as well.  Bilateral inguinal hernias containing fat and ascitic fluid.  Original Report Authenticated By: Judie Petit. Ruel Favors, M.D.   Dg Lumbar Spine Complete  06/08/2011  *RADIOLOGY REPORT*  Clinical Data: Severe low back pain, no fall or injury  LUMBAR SPINE - COMPLETE 4+ VIEW  Comparison: None.  Findings: There is an anterior compression deformity of the T12 vertebral body with approximately 20% loss  vertebral body height. No retropulsion.  No subluxation.  There is endplate osteophytosis at multiple levels in the lumbar spine.  Oblique projections demonstrate no pars fracture.  IMPRESSION:  1.  Wedge compression fracture the T12-2 body of indeterminate age. 2.  Multiple levels of disc osteophytic disease.  Original Report Authenticated By: Genevive Bi, M.D.     1. Wedge compression fracture of lumbar vertebra       MDM  Records reviewed from previous visit. Patient has a wedge compression fracture as diagnosed on CT. He has a followup appointment made with a spine specialist, but is unable to be seen until next week. We'll give him additional pain medication to take at home while awaiting his appointment. He was instructed to continue using a heating pad or ice to the area. He does not currently have any "red flag" signs of back pain. He is nontoxic-appearing and vital signs are stable. He was instructed to continue his Cipro for his UTI and take all of it as prescribed. Patient verbalized understanding and agreed to plan.        Grant Fontana, [place] 06/10/11 [phone]

## 2011-06-10 NOTE — ED Notes (Signed)
Patient was seen on Monday and diagnosed with lumbar fracture, patient reports ongoing pain, here for further evaluation

## 2011-06-11 NOTE — ED Notes (Signed)
Positive urnc- treated per protocol.  

## 2011-06-14 ENCOUNTER — Inpatient Hospital Stay (HOSPITAL_COMMUNITY)
Admission: EM | Admit: 2011-06-14 | Discharge: 2011-06-20 | DRG: 516 | Disposition: A | Discharge status: Home Health | Payer: Worker's Compensation | Attending: Internal Medicine | Admitting: Internal Medicine

## 2011-06-14 ENCOUNTER — Other Ambulatory Visit: Payer: Self-pay

## 2011-06-14 ENCOUNTER — Encounter (HOSPITAL_COMMUNITY): Payer: Self-pay

## 2011-06-14 ENCOUNTER — Emergency Department (HOSPITAL_COMMUNITY): Payer: Worker's Compensation

## 2011-06-14 DIAGNOSIS — W19XXXA Unspecified fall, initial encounter: Secondary | ICD-10-CM | POA: Diagnosis present

## 2011-06-14 DIAGNOSIS — D696 Thrombocytopenia, unspecified: Secondary | ICD-10-CM | POA: Diagnosis present

## 2011-06-14 DIAGNOSIS — D61818 Other pancytopenia: Secondary | ICD-10-CM | POA: Diagnosis present

## 2011-06-14 DIAGNOSIS — R52 Pain, unspecified: Secondary | ICD-10-CM | POA: Diagnosis present

## 2011-06-14 DIAGNOSIS — E119 Type 2 diabetes mellitus without complications: Secondary | ICD-10-CM | POA: Diagnosis present

## 2011-06-14 DIAGNOSIS — M549 Dorsalgia, unspecified: Secondary | ICD-10-CM

## 2011-06-14 DIAGNOSIS — M4310 Spondylolisthesis, site unspecified: Secondary | ICD-10-CM | POA: Diagnosis present

## 2011-06-14 DIAGNOSIS — Z23 Encounter for immunization: Secondary | ICD-10-CM

## 2011-06-14 DIAGNOSIS — N390 Urinary tract infection, site not specified: Secondary | ICD-10-CM | POA: Diagnosis present

## 2011-06-14 DIAGNOSIS — E785 Hyperlipidemia, unspecified: Secondary | ICD-10-CM | POA: Diagnosis present

## 2011-06-14 DIAGNOSIS — A498 Other bacterial infections of unspecified site: Secondary | ICD-10-CM | POA: Diagnosis present

## 2011-06-14 DIAGNOSIS — S22009A Unspecified fracture of unspecified thoracic vertebra, initial encounter for closed fracture: Principal | ICD-10-CM | POA: Diagnosis present

## 2011-06-14 DIAGNOSIS — K7460 Unspecified cirrhosis of liver: Secondary | ICD-10-CM | POA: Diagnosis present

## 2011-06-14 DIAGNOSIS — I10 Essential (primary) hypertension: Secondary | ICD-10-CM | POA: Diagnosis present

## 2011-06-14 DIAGNOSIS — X58XXXA Exposure to other specified factors, initial encounter: Secondary | ICD-10-CM | POA: Diagnosis present

## 2011-06-14 DIAGNOSIS — G459 Transient cerebral ischemic attack, unspecified: Secondary | ICD-10-CM | POA: Diagnosis not present

## 2011-06-14 DIAGNOSIS — T07XXXA Unspecified multiple injuries, initial encounter: Secondary | ICD-10-CM

## 2011-06-14 DIAGNOSIS — K5900 Constipation, unspecified: Secondary | ICD-10-CM | POA: Diagnosis present

## 2011-06-14 HISTORY — DX: Anxiety disorder, unspecified: F41.9

## 2011-06-14 LAB — COMPREHENSIVE METABOLIC PANEL
ALT: 17 U/L (ref 0–53)
AST: 30 U/L (ref 0–37)
Alkaline Phosphatase: 119 U/L — ABNORMAL HIGH (ref 39–117)
CO2: 27 mEq/L (ref 19–32)
Calcium: 9.5 mg/dL (ref 8.4–10.5)
GFR calc Af Amer: >90 mL/min (ref >90)
GFR calc non Af Amer: 87 mL/min — ABNORMAL LOW (ref >90)
Glucose, Bld: 250 mg/dL — ABNORMAL HIGH (ref 70–99)
Potassium: 4.6 mEq/L (ref 3.5–5.1)
Sodium: 135 mEq/L (ref 135–145)

## 2011-06-14 LAB — RAPID URINE DRUG SCREEN, HOSP PERFORMED
Amphetamines: NOT DETECTED
Barbiturates: NOT DETECTED
Benzodiazepines: POSITIVE — AB

## 2011-06-14 LAB — CBC
Hemoglobin: 11.7 g/dL — ABNORMAL LOW (ref 13.0–17.0)
MCH: 30.3 pg (ref 26.0–34.0)
Platelets: 57 10*3/uL — ABNORMAL LOW (ref 150–400)
RBC: 3.86 MIL/uL — ABNORMAL LOW (ref 4.22–5.81)
WBC: 4.8 10*3/uL (ref 4.0–10.5)

## 2011-06-14 LAB — ETHANOL: Alcohol, Ethyl (B): <11 mg/dL (ref 0–11)

## 2011-06-14 LAB — AMMONIA: Ammonia: 54 umol/L (ref 11–60)

## 2011-06-14 MED ORDER — HYDROMORPHONE HCL PF 1 MG/ML IJ SOLN: 1.0000 mg | INTRAMUSCULAR | Status: DC | PRN

## 2011-06-14 MED ORDER — SODIUM CHLORIDE 0.9 % IV SOLN
INTRAVENOUS | Status: AC
  Administered 2011-06-14: 22:00:00 via INTRAVENOUS

## 2011-06-14 MED ORDER — MORPHINE SULFATE 4 MG/ML IJ SOLN
4.0000 mg | Freq: Once | INTRAMUSCULAR | Status: AC
  Administered 2011-06-14: 4 mg via INTRAVENOUS
  Filled 2011-06-14: qty 1

## 2011-06-14 MED ORDER — ONDANSETRON HCL 4 MG/2ML IJ SOLN
4.0000 mg | Freq: Three times a day (TID) | INTRAMUSCULAR | Status: AC | PRN
  Administered 2011-06-14: 4 mg via INTRAVENOUS
  Filled 2011-06-14: qty 2

## 2011-06-14 NOTE — ED Notes (Signed)
Patient is resting comfortably. 

## 2011-06-14 NOTE — ED Notes (Signed)
Tried to call report to amber on the 6th floor and was informed that she could not take report for another 10 min.

## 2011-06-14 NOTE — H&P (Signed)
PCP:   Dr. 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 He landed on his coccyx . Initially he was okay but within a week he had developed severe pain in the low back. He states he could hardly walk, move or turn. He was also getting severe muscle spasms. Pain relieved only when he was laying flat. He came to the ER, imaging done revealed a lumbar fracture. He was discharged home with Vicodin, this did not help, it was changed Percocet, Percocet so does not help. Additionally he feels somewhat sedated on the medications. Today he was heading back to the hospital, he was feeling a bit lightheaded or woozy he tripped and fell hitting his face on the door jam and his knee on a rock uses a doorstop. He was unable to get up, his wife had to help him. Pain in back is now severe and is having difficulty walking. Repeat imaging shows a worsening fracture. Hospitalist service requested to admit. History obtained from patient. Here in the ER, patient does continue to have muscle spasm in the lower back.  Additionally, the patient's last ER visit, his CT abdomen and pelvis showed liver cirrhosis, ascites and portal hypertension. Patient states he has no knowledge of having cirrhosis. He states he does not drink, his father was alcoholic and therefore he is great respect for alcohol.  Review of Systems: Positives bolded The patient denies anorexia, fever, weight loss,, vision loss, decreased hearing, hoarseness, chest pain, syncope, dyspnea on exertion, peripheral edema, balance deficits, hemoptysis, abdominal pain, melena, hematochezia, severe indigestion/heartburn, hematuria, incontinence, genital sores, muscle weakness, suspicious skin lesions, transient blindness, difficulty walking, depression, unusual weight change, abnormal bleeding, enlarged lymph nodes, angioedema, and breast masses.  Past Medical  History: Past Medical History  Diagnosis Date  . Diabetes mellitus   . Hypertension   . High cholesterol    Past Surgical History  Procedure Date  . Tonsillectomy     Medications: Prior to Admission medications   Medication Sig Start Date End Date Taking? Authorizing Provider  aspirin EC 81 MG tablet Take 81 mg by mouth daily.    Yes Historical Provider, MD  ciprofloxacin (CIPRO) 500 MG tablet Take 500 mg by mouth 2 (two) times daily. 14 day course of therapy; not completed. 06/08/11 06/22/11 Yes Tatyana A Kirichenko, PA  diazepam (VALIUM) 5 MG tablet Take 1 tablet (5 mg total) by mouth every 8 (eight) hours as needed for anxiety. 06/08/11 06/18/11 Yes Tatyana A Kirichenko, PA  HYDROcodone-acetaminophen (VICODIN) 5-500 MG per tablet Take 1 tablet by mouth every 4 (four) hours as needed for pain. 06/08/11 06/18/11 Yes Tatyana A Kirichenko, PA  imipramine (TOFRANIL) 50 MG tablet Take 200 mg by mouth at bedtime.     Yes Historical Provider, MD  naproxen sodium (ANAPROX) 220 MG tablet Take 440 mg by mouth 2 (two) times daily with a meal. For back pain   Yes Historical Provider, MD  oxyCODONE-acetaminophen (PERCOCET) 5-325 MG per tablet Take 1 tablet by mouth every 4 (four) hours as needed for pain. 06/10/11 06/20/11 Yes Grant Fontana, PA    Allergies:  No Known Allergies  Social History:  reports that he has never smoked. He does not have any smokeless tobacco history on file. He reports that he does not drink alcohol or use illicit drugs. lives with his wife, not use a walker  Family History: Family  History  Problem Relation Age of Onset  . Coronary artery disease    . Hypertension      Physical Exam: Filed Vitals:   06/14/11 1325 06/14/11 1802  BP: 143/81 151/62  Pulse: 91 73  Temp: 97.9 F (36.6 C) 99 F (37.2 C)  TempSrc: Oral Oral  Resp: 20 20  SpO2: 99% 99%    General:  Alert and oriented times three, well developed and nourished, no acute distress Eyes:  PERRLA, pink conjunctiva, no scleral icterus ENT: Moist oral mucosa, neck supple, no thyromegaly, bruising left side of face Lungs: clear to ascultation, no wheeze, no crackles, no use of accessory muscles Cardiovascular: regular rate and rhythm, no regurgitation, no gallops, no murmurs. No carotid bruits, no JVD Abdomen: soft, positive BS, distended abdomen, ascites, unable to assess organomegaly, not an acute abdomen GU: not examined Neuro: CN II - XII grossly intact, sensation intact Musculoskeletal: strength 5/5 all extremities, no clubbing, cyanosis or edema, bruising left knee Skin: no rash, no subcutaneous crepitation, no decubitus, abrasion left shin Psych: appropriate patient   Labs on Admission:   Basename 06/14/11 1403  NA 135  K 4.6  CL 102  CO2 27  GLUCOSE 250*  BUN 16  CREATININE 0.97  CALCIUM 9.5  MG --  PHOS --    Basename 06/14/11 1403  AST 30  ALT 17  ALKPHOS 119*  BILITOT 1.0  PROT 6.8  ALBUMIN 3.1*   No results found for this basename: LIPASE:2,AMYLASE:2 in the last 72 hours  Basename 06/14/11 1403  WBC 4.8  NEUTROABS --  HGB 11.7*  HCT 34.0*  MCV 88.1  PLT 57*   No results found for this basename: CKTOTAL:3,CKMB:3,CKMBINDEX:3,TROPONINI:3 in the last 72 hours No results found for this basename: TSH,T4TOTAL,FREET3,T3FREE,THYROIDAB in the last 72 hours No results found for this basename: VITAMINB12:2,FOLATE:2,FERRITIN:2,TIBC:2,IRON:2,RETICCTPCT:2 in the last 72 hours  Radiological Exams on Admission: Dg Lumbar Spine Complete  06/14/2011  *RADIOLOGY REPORT*  Clinical Data: 60 year old male with low back pain following fall.  LUMBAR SPINE - COMPLETE 4+ VIEW  Comparison: 06/08/2011  Findings: Five non-rib bearing lumbar type vertebra are again identified in normal alignment. A 50% T12 compression fracture appears stable since the prior study.  Minimal compression of the L1 inferior endplate is unchanged as well. There is no evidence of acute fracture  or subluxation. Mild degenerative disc disease at T11-T12 and L2-L3 are again noted. Bilateral L5 pars defects are again noted without spondylolisthesis.  IMPRESSION: No evidence of acute bony abnormality.  Unchanged T12 compression fracture and mild L1 inferior endplate compression.  Bilateral L5 pars defects without spondylolisthesis.  Original Report Authenticated By: Rosendo Gros, M.D.   Ct Head Wo Contrast  06/14/2011  *RADIOLOGY REPORT*  Clinical Data:  60 year old male with headache and neck pain following fall.  CT HEAD WITHOUT CONTRAST CT CERVICAL SPINE WITHOUT CONTRAST  Technique:  Multidetector CT imaging of the head and cervical spine was performed following the standard protocol without intravenous contrast.  Multiplanar CT image reconstructions of the cervical spine were also generated.  Comparison:  None  CT HEAD  Findings: No acute intracranial abnormalities are identified, including mass lesion or mass effect, hydrocephalus, extra-axial fluid collection, midline shift, hemorrhage, or acute infarction.  The visualized bony calvarium is unremarkable.  IMPRESSION: No evidence of acute intracranial abnormality.  CT CERVICAL SPINE  Findings: Normal alignment is noted. There is no evidence of fracture, subluxation or prevertebral soft tissue swelling. Moderate degenerative disc disease, spondylosis and broad-based  disc bulge at C4-C5 noted contributing to moderate central spinal and biforaminal narrowing. The disc spaces are otherwise maintained. No focal bony lesions are noted. No soft tissue abnormalities are noted.  IMPRESSION: No static evidence of acute injury to the cervical spine.  Moderate degenerative changes at C4-C5 contributing to moderate central spinal biforaminal narrowing.  Original Report Authenticated By: Rosendo Gros, M.D.   Ct Cervical Spine Wo Contrast  06/14/2011  *RADIOLOGY REPORT*  Clinical Data:  60 year old male with headache and neck pain following fall.  CT HEAD  WITHOUT CONTRAST CT CERVICAL SPINE WITHOUT CONTRAST  Technique:  Multidetector CT imaging of the head and cervical spine was performed following the standard protocol without intravenous contrast.  Multiplanar CT image reconstructions of the cervical spine were also generated.  Comparison:  None  CT HEAD  Findings: No acute intracranial abnormalities are identified, including mass lesion or mass effect, hydrocephalus, extra-axial fluid collection, midline shift, hemorrhage, or acute infarction.  The visualized bony calvarium is unremarkable.  IMPRESSION: No evidence of acute intracranial abnormality.  CT CERVICAL SPINE  Findings: Normal alignment is noted. There is no evidence of fracture, subluxation or prevertebral soft tissue swelling. Moderate degenerative disc disease, spondylosis and broad-based disc bulge at C4-C5 noted contributing to moderate central spinal and biforaminal narrowing. The disc spaces are otherwise maintained. No focal bony lesions are noted. No soft tissue abnormalities are noted.  IMPRESSION: No static evidence of acute injury to the cervical spine.  Moderate degenerative changes at C4-C5 contributing to moderate central spinal biforaminal narrowing.  Original Report Authenticated By: Rosendo Gros, M.D.   CT Abd/Pelvis 06/08/2011 IMPRESSION:  Hepatic cirrhosis with perihepatic ascites, diffuse mesenteric  vascular congestion, and left upper quadrant varices secondary to  portal hypertension.  Splenomegaly  Diverticulosis  Dependent pelvic ascites as well.  Bilateral inguinal hernias containing fat and ascitic fluid.  Original Report Authenticated By: Judie Petit. Ruel Favors, M.D    Assessment/Plan Present on Admission:  .Intractable pain .Fall Admit to observation status, med surg Titrate the medications Physical therapy consult The ER physician is concerned re: Patient seems easily sedated, this could be because patient takes Valium and imipramine and is now on Vicodin and  Percocet. Additionally on patient's last admission CT scan read as liver cirrhosis. Patient not likely not current medications efficiently. We'll check an ammonia level. Continue imipramine as this cannot be discontinued suddenly and space valium.  The patient does get muscle spasms therefore an infrequent dose of Flexeril ordered, monitor for sedation. Liver cirrhosis -new diagnosis  Ascites/thrombocytopenia Patient denies any history of alcohol abuse Check hepatitis panel  Naprosyn and aspirin held Ammonia level ordered  diabetes mellitus   hypertension   dyslipidemia Stable Resume home meds  ADA diet, sliding scale insulin   Full code  SCDs for DVT prophylaxis Team 3/Dr. Severiano Gilbert, Tru Leopard 06/14/2011, 9:34 PM

## 2011-06-14 NOTE — ED Provider Notes (Signed)
History     CSN: [id number] Arrival date & time: 06/14/2011  1:18 PM   First MD Initiated Contact with Patient 06/14/11 1342      Chief Complaint  Patient presents with  . Fall    pt in with fall states fell earlier present with abrasion to the left side of face and knee denies loc states pain in the lower back pt also states a fx L5 that occured on the 9th, pt states last took pain medication at 0900 pt presents with delayed thought process and slurred speech    (Consider location/radiation/quality/duration/timing/severity/associated sxs/prior treatment) HPI Comments: Pt with recent visits for back pain.  Was here 4 days ago, had CT scan/x-rays, showed T12 compression fx of indeterminate age.  Has appt with spine specialist on Wednesday.  Today took percocet, valium for pain.  Felt a little wobbly when he got up and fell forward, hitting face.  On arrival is slow to answer questions with slurred speech.  Denies ETOH use or other drug use.  Says back pain is slightly worse than what it has been, no radiation down legs, except briefly down left leg earlier.  No loss of bowel/bladder control  Patient is a 60 y.o. male presenting with fall.  Fall The accident occurred less than 1 hour ago. The fall occurred while walking. He landed on a hard floor. The volume of blood lost was minimal. The point of impact was the head and left knee. Pain location: lower back. The pain is moderate. He was ambulatory at the scene. There was drug use (took a valium and percocet this am) involved in the accident. There was no alcohol use involved in the accident. Pertinent negatives include no fever, no numbness, no abdominal pain, no nausea, no vomiting, no hematuria and no headaches. He has tried nothing for the symptoms.    Past Medical History  Diagnosis Date  . Diabetes mellitus   . Hypertension   . High cholesterol     Past Surgical History  Procedure Date  . Tonsillectomy     History reviewed. No  pertinent family history.  History  Substance Use Topics  . Smoking status: Current Some Day Smoker    Types: Cigarettes  . Smokeless tobacco: Not on file  . Alcohol Use: No      Review of Systems  Constitutional: Negative for fever, chills, diaphoresis and fatigue.  HENT: Negative for congestion, rhinorrhea and sneezing.   Eyes: Negative.   Respiratory: Negative for cough, chest tightness and shortness of breath.   Cardiovascular: Negative for chest pain and leg swelling.  Gastrointestinal: Negative for nausea, vomiting, abdominal pain, diarrhea and blood in stool.  Genitourinary: Negative for frequency, hematuria, flank pain and difficulty urinating.  Musculoskeletal: Positive for back pain. Negative for arthralgias.  Skin: Negative for rash.  Neurological: Positive for light-headedness. Negative for dizziness, speech difficulty, weakness, numbness and headaches.    Allergies  Review of patient's allergies indicates no known allergies.  Home Medications   Current Outpatient Rx  Name Route Sig Dispense Refill  . ASPIRIN EC 81 MG PO TBEC Oral Take 81 mg by mouth daily.     Marland Kitchen CIPROFLOXACIN HCL 500 MG PO TABS Oral Take 500 mg by mouth 2 (two) times daily. 14 day course of therapy; not completed.    Marland Kitchen DIAZEPAM 5 MG PO TABS Oral Take 1 tablet (5 mg total) by mouth every 8 (eight) hours as needed for anxiety. 15 tablet 0  . HYDROCODONE-ACETAMINOPHEN 5-500 MG  PO TABS Oral Take 1 tablet by mouth every 4 (four) hours as needed for pain. 20 tablet 0  . IMIPRAMINE HCL 50 MG PO TABS Oral Take 200 mg by mouth at bedtime.      Marland Kitchen NAPROXEN SODIUM 220 MG PO TABS Oral Take 440 mg by mouth 2 (two) times daily with a meal. For back pain    . OXYCODONE-ACETAMINOPHEN 5-325 MG PO TABS Oral Take 1 tablet by mouth every 4 (four) hours as needed for pain. 45 tablet 0    BP 143/81  Pulse 91  Temp(Src) 97.9 F (36.6 C) (Oral)  Resp 20  SpO2 99%  Physical Exam  Constitutional: He appears  well-developed and well-nourished.  HENT:  Right Ear: External ear normal.  Left Ear: External ear normal.  Mouth/Throat: Oropharynx is clear and moist.       Abrasions to left side of face/forehead and maxilla area as well as nose.  No bony tenderness or deformity.   No dental injury.  No septal hematoma.  EOMI, pupils pinpoint  Eyes: Conjunctivae and EOM are normal. Pupils are equal, round, and reactive to light.  Neck:       No pain on palpation of c/t spine.  Some minor tenderness to lower lumbar spine  Cardiovascular: Normal rate, regular rhythm and normal heart sounds.  Exam reveals no gallop and no friction rub.   No murmur heard. Pulmonary/Chest: Effort normal and breath sounds normal.  Abdominal: Soft. Bowel sounds are normal. There is no tenderness. There is no rebound and no guarding.  Musculoskeletal:       Abrasion to left knee.  No bony tenderness on palpation or ROM of extremities  Neurological: He is alert. He has normal strength. No cranial nerve deficit or sensory deficit.       Oriented to person, place, month/year, but slow to answer questions  Skin: Skin is warm and dry.    ED Course  Procedures (including critical care time)  Results for orders placed during the hospital encounter of 06/14/11  CBC      Component Value Range   WBC 4.8  4.0 - 10.5 (K/uL)   RBC 3.86 (*) 4.22 - 5.81 (MIL/uL)   Hemoglobin 11.7 (*) 13.0 - 17.0 (g/dL)   HCT 78.2 (*) 95.6 - 52.0 (%)   MCV 88.1  78.0 - 100.0 (fL)   MCH 30.3  26.0 - 34.0 (pg)   MCHC 34.4  30.0 - 36.0 (g/dL)   RDW 21.3  08.6 - 57.8 (%)   Platelets 57 (*) 150 - 400 (K/uL)  COMPREHENSIVE METABOLIC PANEL      Component Value Range   Sodium 135  135 - 145 (mEq/L)   Potassium 4.6  3.5 - 5.1 (mEq/L)   Chloride 102  96 - 112 (mEq/L)   CO2 27  19 - 32 (mEq/L)   Glucose, Bld 250 (*) 70 - 99 (mg/dL)   BUN 16  6 - 23 (mg/dL)   Creatinine, Ser 4.69  0.50 - 1.35 (mg/dL)   Calcium 9.5  8.4 - 62.9 (mg/dL)   Total Protein 6.8   6.0 - 8.3 (g/dL)   Albumin 3.1 (*) 3.5 - 5.2 (g/dL)   AST 30  0 - 37 (U/L)   ALT 17  0 - 53 (U/L)   Alkaline Phosphatase 119 (*) 39 - 117 (U/L)   Total Bilirubin 1.0  0.3 - 1.2 (mg/dL)   GFR calc non Af Amer 87 (*) >90 (mL/min)   GFR calc  Af Amer >90  >90 (mL/min)  ETHANOL      Component Value Range   Alcohol, Ethyl (B) <11  0 - 11 (mg/dL)   Ct Abdomen Pelvis Wo Contrast  06/08/2011  *RADIOLOGY REPORT*  Clinical Data: Back pain, UTI  CT ABDOMEN AND PELVIS WITHOUT CONTRAST  Technique:  Multidetector CT imaging of the abdomen and pelvis was performed following the standard protocol without intravenous contrast.  Comparison: None.  Findings: Minimal basilar atelectasis / scarring scattered bilaterally.  Normal heart size.  No pericardial or pleural effusion.  Slight symmetric gynecomastia, image 8.  Abdomen: Irregular nodular liver contour suspicious for underlying hepatic cirrhosis.  Small amount of perihepatic right upper quadrant ascites.  Benign-appearing calcification adjacent to the gallbladder and appears outside the biliary tree, possibly vascular.  Slight distention of the gallbladder.  No definite biliary dilatation or obstruction.  There is diffuse mesenteric vascular congestion/mild edema.  In the left upper quadrant, there are tortuous vascular structures in the splenic hilum and along the GE junction consistent with varices.  These findings are likely secondary to chronic portal hypertension.  Spleen appears enlarged roughly measuring 15 cm.  Pancreas is diffusely atrophic.  No pancreatic ductal dilatation identified.  Visualized adrenal glands are symmetric.  Kidneys demonstrate no obstruction, urinary tract calculus, hydronephrosis or obstructive uropathy.  Exam is limited without IV or oral contrast.  No abdominal of fluid collection, abscess, hemorrhage, or free air.  No bowel obstruction of pattern, dilatation, ileus.  Retained stool throughout the colon.  Aortic atherosclerosis noted.   No aneurysm.  Pelvis:  Pelvic dependent ascites noted superior to the bladder. Bladder is decompressed.  No distal ureteral calculus or UVJ abnormality.  Scattered mild colonic diverticulosis.  No pelvic abscess, hemorrhage, adenopathy, or inguinal abnormality. Bilateral inguinal hernias noted containing fat and ascites.  Degenerative changes of the spine.  Chronic compression fracture at T12.  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  Splenomegaly  Diverticulosis  Dependent pelvic ascites as well.  Bilateral inguinal hernias containing fat and ascitic fluid.  Original Report Authenticated By: Judie Petit. Ruel Favors, M.D.   Dg Lumbar Spine Complete  06/14/2011  *RADIOLOGY REPORT*  Clinical Data: 60 year old male with low back pain following fall.  LUMBAR SPINE - COMPLETE 4+ VIEW  Comparison: 06/08/2011  Findings: Five non-rib bearing lumbar type vertebra are again identified in normal alignment. A 50% T12 compression fracture appears stable since the prior study.  Minimal compression of the L1 inferior endplate is unchanged as well. There is no evidence of acute fracture or subluxation. Mild degenerative disc disease at T11-T12 and L2-L3 are again noted. Bilateral L5 pars defects are again noted without spondylolisthesis.  IMPRESSION: No evidence of acute bony abnormality.  Unchanged T12 compression fracture and mild L1 inferior endplate compression.  Bilateral L5 pars defects without spondylolisthesis.  Original Report Authenticated By: Rosendo Gros, M.D.   Dg Lumbar Spine Complete  06/08/2011  *RADIOLOGY REPORT*  Clinical Data: Severe low back pain, no fall or injury  LUMBAR SPINE - COMPLETE 4+ VIEW  Comparison: None.  Findings: There is an anterior compression deformity of the T12 vertebral body with approximately 20% loss vertebral body height. No retropulsion.  No subluxation.  There is  endplate osteophytosis at multiple levels in the lumbar spine.  Oblique projections demonstrate no pars fracture.  IMPRESSION:  1.  Wedge compression fracture the T12-2 body of indeterminate age. 2.  Multiple levels  of disc osteophytic disease.  Original Report Authenticated By: Genevive Bi, M.D.   Ct Head Wo Contrast  06/14/2011  *RADIOLOGY REPORT*  Clinical Data:  60 year old male with headache and neck pain following fall.  CT HEAD WITHOUT CONTRAST CT CERVICAL SPINE WITHOUT CONTRAST  Technique:  Multidetector CT imaging of the head and cervical spine was performed following the standard protocol without intravenous contrast.  Multiplanar CT image reconstructions of the cervical spine were also generated.  Comparison:  None  CT HEAD  Findings: No acute intracranial abnormalities are identified, including mass lesion or mass effect, hydrocephalus, extra-axial fluid collection, midline shift, hemorrhage, or acute infarction.  The visualized bony calvarium is unremarkable.  IMPRESSION: No evidence of acute intracranial abnormality.  CT CERVICAL SPINE  Findings: Normal alignment is noted. There is no evidence of fracture, subluxation or prevertebral soft tissue swelling. Moderate degenerative disc disease, spondylosis and broad-based disc bulge at C4-C5 noted contributing to moderate central spinal and biforaminal narrowing. The disc spaces are otherwise maintained. No focal bony lesions are noted. No soft tissue abnormalities are noted.  IMPRESSION: No static evidence of acute injury to the cervical spine.  Moderate degenerative changes at C4-C5 contributing to moderate central spinal biforaminal narrowing.  Original Report Authenticated By: Rosendo Gros, M.D.   Ct Cervical Spine Wo Contrast  06/14/2011  *RADIOLOGY REPORT*  Clinical Data:  60 year old male with headache and neck pain following fall.  CT HEAD WITHOUT CONTRAST CT CERVICAL SPINE WITHOUT CONTRAST  Technique:  Multidetector CT imaging of the  head and cervical spine was performed following the standard protocol without intravenous contrast.  Multiplanar CT image reconstructions of the cervical spine were also generated.  Comparison:  None  CT HEAD  Findings: No acute intracranial abnormalities are identified, including mass lesion or mass effect, hydrocephalus, extra-axial fluid collection, midline shift, hemorrhage, or acute infarction.  The visualized bony calvarium is unremarkable.  IMPRESSION: No evidence of acute intracranial abnormality.  CT CERVICAL SPINE  Findings: Normal alignment is noted. There is no evidence of fracture, subluxation or prevertebral soft tissue swelling. Moderate degenerative disc disease, spondylosis and broad-based disc bulge at C4-C5 noted contributing to moderate central spinal and biforaminal narrowing. The disc spaces are otherwise maintained. No focal bony lesions are noted. No soft tissue abnormalities are noted.  IMPRESSION: No static evidence of acute injury to the cervical spine.  Moderate degenerative changes at C4-C5 contributing to moderate central spinal biforaminal narrowing.  Original Report Authenticated By: Rosendo Gros, M.D.   Results for orders placed during the hospital encounter of 06/14/11  CBC      Component Value Range   WBC 4.8  4.0 - 10.5 (K/uL)   RBC 3.86 (*) 4.22 - 5.81 (MIL/uL)   Hemoglobin 11.7 (*) 13.0 - 17.0 (g/dL)   HCT 16.1 (*) 09.6 - 52.0 (%)   MCV 88.1  78.0 - 100.0 (fL)   MCH 30.3  26.0 - 34.0 (pg)   MCHC 34.4  30.0 - 36.0 (g/dL)   RDW 04.5  40.9 - 81.1 (%)   Platelets 57 (*) 150 - 400 (K/uL)  COMPREHENSIVE METABOLIC PANEL      Component Value Range   Sodium 135  135 - 145 (mEq/L)   Potassium 4.6  3.5 - 5.1 (mEq/L)   Chloride 102  96 - 112 (mEq/L)   CO2 27  19 - 32 (mEq/L)   Glucose, Bld 250 (*) 70 - 99 (mg/dL)   BUN 16  6 - 23 (mg/dL)  Creatinine, Ser 0.97  0.50 - 1.35 (mg/dL)   Calcium 9.5  8.4 - 04.5 (mg/dL)   Total Protein 6.8  6.0 - 8.3 (g/dL)   Albumin 3.1  (*) 3.5 - 5.2 (g/dL)   AST 30  0 - 37 (U/L)   ALT 17  0 - 53 (U/L)   Alkaline Phosphatase 119 (*) 39 - 117 (U/L)   Total Bilirubin 1.0  0.3 - 1.2 (mg/dL)   GFR calc non Af Amer 87 (*) >90 (mL/min)   GFR calc Af Amer >90  >90 (mL/min)  ETHANOL      Component Value Range   Alcohol, Ethyl (B) <11  0 - 11 (mg/dL)   Ct Abdomen Pelvis Wo Contrast  06/08/2011  *RADIOLOGY REPORT*  Clinical Data: Back pain, UTI  CT ABDOMEN AND PELVIS WITHOUT CONTRAST  Technique:  Multidetector CT imaging of the abdomen and pelvis was performed following the standard protocol without intravenous contrast.  Comparison: None.  Findings: Minimal basilar atelectasis / scarring scattered bilaterally.  Normal heart size.  No pericardial or pleural effusion.  Slight symmetric gynecomastia, image 8.  Abdomen: Irregular nodular liver contour suspicious for underlying hepatic cirrhosis.  Small amount of perihepatic right upper quadrant ascites.  Benign-appearing calcification adjacent to the gallbladder and appears outside the biliary tree, possibly vascular.  Slight distention of the gallbladder.  No definite biliary dilatation or obstruction.  There is diffuse mesenteric vascular congestion/mild edema.  In the left upper quadrant, there are tortuous vascular structures in the splenic hilum and along the GE junction consistent with varices.  These findings are likely secondary to chronic portal hypertension.  Spleen appears enlarged roughly measuring 15 cm.  Pancreas is diffusely atrophic.  No pancreatic ductal dilatation identified.  Visualized adrenal glands are symmetric.  Kidneys demonstrate no obstruction, urinary tract calculus, hydronephrosis or obstructive uropathy.  Exam is limited without IV or oral contrast.  No abdominal of fluid collection, abscess, hemorrhage, or free air.  No bowel obstruction of pattern, dilatation, ileus.  Retained stool throughout the colon.  Aortic atherosclerosis noted.  No aneurysm.  Pelvis:  Pelvic  dependent ascites noted superior to the bladder. Bladder is decompressed.  No distal ureteral calculus or UVJ abnormality.  Scattered mild colonic diverticulosis.  No pelvic abscess, hemorrhage, adenopathy, or inguinal abnormality. Bilateral inguinal hernias noted containing fat and ascites.  Degenerative changes of the spine.  Chronic compression fracture at T12.  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  Splenomegaly  Diverticulosis  Dependent pelvic ascites as well.  Bilateral inguinal hernias containing fat and ascitic fluid.  Original Report Authenticated By: Judie Petit. Ruel Favors, M.D.   Dg Lumbar Spine Complete  06/14/2011  *RADIOLOGY REPORT*  Clinical Data: 59 year old male with low back pain following fall.  LUMBAR SPINE - COMPLETE 4+ VIEW  Comparison: 06/08/2011  Findings: Five non-rib bearing lumbar type vertebra are again identified in normal alignment. A 50% T12 compression fracture appears stable since the prior study.  Minimal compression of the L1 inferior endplate is unchanged as well. There is no evidence of acute fracture or subluxation. Mild degenerative disc disease at T11-T12 and L2-L3 are again noted. Bilateral L5 pars defects are again noted without spondylolisthesis.  IMPRESSION: No evidence of acute bony abnormality.  Unchanged T12 compression fracture and mild L1 inferior endplate compression.  Bilateral L5 pars defects without spondylolisthesis.  Original Report Authenticated By: Rosendo Gros, M.D.   Dg Lumbar Spine  Complete  06/08/2011  *RADIOLOGY REPORT*  Clinical Data: Severe low back pain, no fall or injury  LUMBAR SPINE - COMPLETE 4+ VIEW  Comparison: None.  Findings: There is an anterior compression deformity of the T12 vertebral body with approximately 20% loss vertebral body height. No retropulsion.  No subluxation.  There is endplate osteophytosis at  multiple levels in the lumbar spine.  Oblique projections demonstrate no pars fracture.  IMPRESSION:  1.  Wedge compression fracture the T12-2 body of indeterminate age. 2.  Multiple levels of disc osteophytic disease.  Original Report Authenticated By: Genevive Bi, M.D.   Ct Head Wo Contrast  06/14/2011  *RADIOLOGY REPORT*  Clinical Data:  60 year old male with headache and neck pain following fall.  CT HEAD WITHOUT CONTRAST CT CERVICAL SPINE WITHOUT CONTRAST  Technique:  Multidetector CT imaging of the head and cervical spine was performed following the standard protocol without intravenous contrast.  Multiplanar CT image reconstructions of the cervical spine were also generated.  Comparison:  None  CT HEAD  Findings: No acute intracranial abnormalities are identified, including mass lesion or mass effect, hydrocephalus, extra-axial fluid collection, midline shift, hemorrhage, or acute infarction.  The visualized bony calvarium is unremarkable.  IMPRESSION: No evidence of acute intracranial abnormality.  CT CERVICAL SPINE  Findings: Normal alignment is noted. There is no evidence of fracture, subluxation or prevertebral soft tissue swelling. Moderate degenerative disc disease, spondylosis and broad-based disc bulge at C4-C5 noted contributing to moderate central spinal and biforaminal narrowing. The disc spaces are otherwise maintained. No focal bony lesions are noted. No soft tissue abnormalities are noted.  IMPRESSION: No static evidence of acute injury to the cervical spine.  Moderate degenerative changes at C4-C5 contributing to moderate central spinal biforaminal narrowing.  Original Report Authenticated By: Rosendo Gros, M.D.   Ct Cervical Spine Wo Contrast  06/14/2011  *RADIOLOGY REPORT*  Clinical Data:  60 year old male with headache and neck pain following fall.  CT HEAD WITHOUT CONTRAST CT CERVICAL SPINE WITHOUT CONTRAST  Technique:  Multidetector CT imaging of the head and cervical spine  was performed following the standard protocol without intravenous contrast.  Multiplanar CT image reconstructions of the cervical spine were also generated.  Comparison:  None  CT HEAD  Findings: No acute intracranial abnormalities are identified, including mass lesion or mass effect, hydrocephalus, extra-axial fluid collection, midline shift, hemorrhage, or acute infarction.  The visualized bony calvarium is unremarkable.  IMPRESSION: No evidence of acute intracranial abnormality.  CT CERVICAL SPINE  Findings: Normal alignment is noted. There is no evidence of fracture, subluxation or prevertebral soft tissue swelling. Moderate degenerative disc disease, spondylosis and broad-based disc bulge at C4-C5 noted contributing to moderate central spinal and biforaminal narrowing. The disc spaces are otherwise maintained. No focal bony lesions are noted. No soft tissue abnormalities are noted.  IMPRESSION: No static evidence of acute injury to the cervical spine.  Moderate degenerative changes at C4-C5 contributing to moderate central spinal biforaminal narrowing.  Original Report Authenticated By: Rosendo Gros, M.D.     Date: 06/14/2011  Rate: 80  Rhythm: normal sinus rhythm  QRS Axis: normal  Intervals: normal  ST/T Wave abnormalities: normal  Conduction Disutrbances:none  Narrative Interpretation:   Old EKG Reviewed: none available     1. Abrasions of multiple sites   2. Back pain       MDM  Pt with fall, feel that this is likely secondary to meds.  Still drowsy, but no neuro deficits to suggest CVA.  No evidence of ICH.  Will turn pt over to Dr. Juleen China, will need further time to become fully alert, ambulate        Rolan Bucco, MD 06/14/11 1616

## 2011-06-14 NOTE — ED Notes (Signed)
MD at bedside. 

## 2011-06-14 NOTE — ED Notes (Signed)
Attempted to ambulate patient in room to hallway and he continues to be unable to ambulate but a short distance.  He continues to be dizzy and barely able to pick his feet up d/t pain in his lower back and his hips.  Will continue to monitor closely.

## 2011-06-14 NOTE — ED Provider Notes (Addendum)
Pt reassessed. Pt sitting up in bed and talking on phone when entered room. No new complaints. Alert and appropriate. Evidence of prior t12/l1 fx noted. No acute radiographic injuries noted. Will continue with plan as discussed with Dr Fredderick Phenix for discharge since pt is now more awake and acting appropriately.  Raeford Razor, MD 06/14/11 1834  8:51 PM Pt ambulated by nursing. Pt can barely walk do to severe pain. Meds in ED initially held because decreased mental status which was thought to be secondary to pain meds/benzos. Concern with continued falls at home and lives alone with wife who has health issues of own. Will discuss with hospitalist.  Raeford Razor, MD 06/14/11 2053

## 2011-06-14 NOTE — ED Notes (Signed)
Patient fell this morning after taking percocet for increased pain in the L5 area in which he has a fracture.  Patient has sustained abrasions and bruising to the left side of his face and an abrasion to the left knee.  He states that he is taking his percocet every 4 hours whether he needs it or not.  At this time patient's pupils are 1mm and slow to react.  Patient's speech is slow, but he does seem to answer appropriately w/o difficulty.  Continues to have pain in the lumbar area and down the left leg.  Able to describe what happened to him this morning.

## 2011-06-15 ENCOUNTER — Encounter (HOSPITAL_COMMUNITY): Payer: Self-pay | Admitting: *Deleted

## 2011-06-15 LAB — CBC
HCT: 33 % — ABNORMAL LOW (ref 39.0–52.0)
HCT: 32.8 % — ABNORMAL LOW (ref 39.0–52.0)
MCH: 30 pg (ref 26.0–34.0)
MCV: 87.9 fL (ref 78.0–100.0)
RBC: 3.81 MIL/uL — ABNORMAL LOW (ref 4.22–5.81)
RBC: 3.73 MIL/uL — ABNORMAL LOW (ref 4.22–5.81)
RDW: 13.9 % (ref 11.5–15.5)
RDW: 13.7 % (ref 11.5–15.5)
WBC: 3.4 10*3/uL — ABNORMAL LOW (ref 4.0–10.5)
WBC: 3.5 10*3/uL — ABNORMAL LOW (ref 4.0–10.5)

## 2011-06-15 LAB — URINALYSIS, ROUTINE W REFLEX MICROSCOPIC
Glucose, UA: NEGATIVE mg/dL
Hgb urine dipstick: NEGATIVE
Ketones, ur: NEGATIVE mg/dL
Leukocytes, UA: NEGATIVE
Protein, ur: NEGATIVE mg/dL

## 2011-06-15 LAB — COMPREHENSIVE METABOLIC PANEL
Alkaline Phosphatase: 124 U/L — ABNORMAL HIGH (ref 39–117)
BUN: 14 mg/dL (ref 6–23)
Calcium: 9.1 mg/dL (ref 8.4–10.5)
Creatinine, Ser: 0.8 mg/dL (ref 0.50–1.35)
GFR calc Af Amer: >90 mL/min (ref >90)
Glucose, Bld: 142 mg/dL — ABNORMAL HIGH (ref 70–99)
Total Protein: 6.5 g/dL (ref 6.0–8.3)

## 2011-06-15 MED ORDER — CEFAZOLIN SODIUM 1-5 GM-% IV SOLN
1.0000 g | Freq: Once | INTRAVENOUS | Status: AC
  Administered 2011-06-15: 1 g via INTRAVENOUS
  Filled 2011-06-15: qty 50

## 2011-06-15 MED ORDER — CYCLOBENZAPRINE HCL 10 MG PO TABS
5.0000 mg | ORAL_TABLET | Freq: Two times a day (BID) | ORAL | Status: DC | PRN
Start: 2011-06-15 — End: 2011-06-20
  Filled 2011-06-15 (×2): qty 1
  Filled 2011-06-15: qty 0.5

## 2011-06-15 MED ORDER — ACETAMINOPHEN 325 MG PO TABS: 650.0000 mg | ORAL_TABLET | Freq: Four times a day (QID) | ORAL | Status: DC | PRN

## 2011-06-15 MED ORDER — ALUM & MAG HYDROXIDE-SIMETH 200-200-20 MG/5ML PO SUSP: 30.0000 mL | Freq: Four times a day (QID) | ORAL | Status: DC | PRN

## 2011-06-15 MED ORDER — IMIPRAMINE HCL 50 MG PO TABS
100.0000 mg | ORAL_TABLET | Freq: Every day | ORAL | Status: DC
Start: 2011-06-15 — End: 2011-06-20
  Administered 2011-06-15 – 2011-06-19 (×4): 100 mg via ORAL
  Filled 2011-06-15 (×7): qty 2

## 2011-06-15 MED ORDER — ASPIRIN EC 81 MG PO TBEC
81.0000 mg | DELAYED_RELEASE_TABLET | Freq: Every day | ORAL | Status: DC
  Filled 2011-06-15: qty 1

## 2011-06-15 MED ORDER — ONDANSETRON HCL 4 MG/2ML IJ SOLN: 4.0000 mg | Freq: Four times a day (QID) | INTRAMUSCULAR | Status: DC | PRN

## 2011-06-15 MED ORDER — DIAZEPAM 5 MG PO TABS: 5.0000 mg | ORAL_TABLET | Freq: Two times a day (BID) | ORAL | Status: DC | PRN

## 2011-06-15 MED ORDER — HYDROMORPHONE HCL PF 2 MG/ML IJ SOLN
1.0000 mg | INTRAMUSCULAR | Status: AC | PRN
  Administered 2011-06-15: 1 mg via INTRAVENOUS
  Filled 2011-06-15 (×2): qty 1

## 2011-06-15 MED ORDER — HYDROCODONE-ACETAMINOPHEN 5-325 MG PO TABS
1.0000 | ORAL_TABLET | ORAL | Status: DC | PRN
  Administered 2011-06-15 – 2011-06-16 (×4): 2 via ORAL
  Administered 2011-06-17: 1 via ORAL
  Administered 2011-06-17 – 2011-06-20 (×9): 2 via ORAL
  Filled 2011-06-15 (×15): qty 2

## 2011-06-15 MED ORDER — ONDANSETRON HCL 4 MG PO TABS: 4.0000 mg | ORAL_TABLET | Freq: Four times a day (QID) | ORAL | Status: DC | PRN

## 2011-06-15 MED ORDER — MORPHINE SULFATE 2 MG/ML IJ SOLN
1.0000 mg | INTRAMUSCULAR | Status: DC | PRN
  Administered 2011-06-17 – 2011-06-20 (×5): 1 mg via INTRAVENOUS
  Filled 2011-06-15 (×4): qty 1

## 2011-06-15 MED ORDER — SODIUM CHLORIDE 0.9 % IJ SOLN: 3.0000 mL | INTRAMUSCULAR | Status: DC | PRN

## 2011-06-15 MED ORDER — INSULIN ASPART 100 UNIT/ML ~~LOC~~ SOLN
0.0000 [IU] | Freq: Three times a day (TID) | SUBCUTANEOUS | Status: DC
  Administered 2011-06-15: 5 [IU] via SUBCUTANEOUS
  Administered 2011-06-15 – 2011-06-16 (×2): 2 [IU] via SUBCUTANEOUS
  Administered 2011-06-17: 11 [IU] via SUBCUTANEOUS
  Administered 2011-06-17 (×2): 2 [IU] via SUBCUTANEOUS
  Administered 2011-06-18: 5 [IU] via SUBCUTANEOUS
  Administered 2011-06-18: 2 [IU] via SUBCUTANEOUS
  Administered 2011-06-19: 5 [IU] via SUBCUTANEOUS
  Administered 2011-06-19: 3 [IU] via SUBCUTANEOUS
  Administered 2011-06-20: 8 [IU] via SUBCUTANEOUS
  Administered 2011-06-20: 3 [IU] via SUBCUTANEOUS
  Administered 2011-06-20: 2 [IU] via SUBCUTANEOUS
  Filled 2011-06-15 (×2): qty 3

## 2011-06-15 MED ORDER — INSULIN ASPART 100 UNIT/ML ~~LOC~~ SOLN
0.0000 [IU] | Freq: Every day | SUBCUTANEOUS | Status: DC
  Administered 2011-06-15 – 2011-06-19 (×2): 2 [IU] via SUBCUTANEOUS

## 2011-06-15 MED ORDER — IMIPRAMINE HCL 50 MG PO TABS
200.0000 mg | ORAL_TABLET | Freq: Every day | ORAL | Status: DC
  Filled 2011-06-15 (×2): qty 4

## 2011-06-15 MED ORDER — ACETAMINOPHEN 650 MG RE SUPP: 650.0000 mg | Freq: Four times a day (QID) | RECTAL | Status: DC | PRN

## 2011-06-15 NOTE — ED Notes (Signed)
Report called to rhonda. Patient stable upon admission.  Patient states understanding of admission and no questions from receiving rn

## 2011-06-15 NOTE — Progress Notes (Signed)
Subjective: C/o 8/10 back pain  Objective: Vital signs in last 24 hours: Temp:  [97.9 F (36.6 C)-99 F (37.2 C)] 98.3 F (36.8 C) (11/26 0615) Pulse Rate:  [71-98] 71  (11/26 0615) Resp:  [20-88] 20  (11/26 0615) BP: (133-151)/(62-81) 134/75 mmHg (11/26 0615) SpO2:  [97 %-100 %] 97 % (11/26 0615) Weight:  [94.7 kg (208 lb 12.4 oz)] 208 lb 12.4 oz (94.7 kg) (11/26 0049) Weight change:  Last BM Date: 06/14/11  Intake/Output from previous day: 11/25 0701 - 11/26 0700 In: 240 [P.O.:240] Out: -    Physical Exam: General: Alert, awake, oriented x3, in no acute distress. HEENT: No bruits, no goiter. Heart: Regular rate and rhythm, without murmurs, rubs, gallops. Lungs: Clear to auscultation bilaterally. Abdomen: Soft, nontender, nondistended, positive bowel sounds. Extremities: No clubbing cyanosis or edema with positive pedal pulses,  Back: tender over lower thoracic spine Neuro: Grossly intact, nonfocal.  Lab Results: Basic Metabolic Panel:  Basename 06/15/11 0452 06/14/11 1403  NA 137 135  K 3.8 4.6  CL 105 102  CO2 25 27  GLUCOSE 142* 250*  BUN 14 16  CREATININE 0.80 0.97  CALCIUM 9.1 9.5  MG -- --  PHOS -- --   Liver Function Tests:  [facility] 06/15/11 0452 06/14/11 1403  AST 30 30  ALT 16 17  ALKPHOS 124* 119*  BILITOT 0.6 1.0  PROT 6.5 6.8  ALBUMIN 2.8* 3.1*   No results found for this basename: LIPASE:2,AMYLASE:2 in the last 72 hours  Basename 06/15/11 0452 06/14/11 2120  AMMONIA 76* 54   CBC:  Basename 06/15/11 0452 06/14/11 1403  WBC 3.4* 4.8  NEUTROABS -- --  HGB 11.6* 11.7*  HCT 33.0* 34.0*  MCV 86.6 88.1  PLT 54* 57*   Cardiac Enzymes: No results found for this basename: CKTOTAL:3,CKMB:3,CKMBINDEX:3,TROPONINI:3 in the last 72 hours BNP: No results found for this basename: POCBNP:3 in the last 72 hours D-Dimer: No results found for this basename: DDIMER:2 in the last 72 hours CBG:  Basename 06/15/11 0745  GLUCAP 133*   Hemoglobin  A1C: No results found for this basename: HGBA1C in the last 72 hours Fasting Lipid Panel: No results found for this basename: CHOL,HDL,LDLCALC,TRIG,CHOLHDL,LDLDIRECT in the last 72 hours Thyroid Function Tests: No results found for this basename: TSH,T4TOTAL,FREET4,T3FREE,THYROIDAB in the last 72 hours Anemia Panel: No results found for this basename: VITAMINB12,FOLATE,FERRITIN,TIBC,IRON,RETICCTPCT in the last 72 hours Coagulation: No results found for this basename: LABPROT:2,INR:2 in the last 72 hours Urine Drug Screen: Drugs of Abuse     Component Value Date/Time   LABOPIA NONE DETECTED 06/14/2011 1553   COCAINSCRNUR NONE DETECTED 06/14/2011 1553   LABBENZ POSITIVE* 06/14/2011 1553   AMPHETMU NONE DETECTED 06/14/2011 1553   THCU NONE DETECTED 06/14/2011 1553   LABBARB NONE DETECTED 06/14/2011 1553    Alcohol Level:  Basename 06/14/11 1403  ETH <11    Recent Results (from the past 240 hour(s))  URINE CULTURE     Status: Normal   Collection Time   06/08/11  6:03 PM      Component Value Range Status Comment   Specimen Description URINE, CLEAN CATCH   Final    Special Requests NONE   Final    Setup Time [id number]   Final    Colony Count >=100,000 COLONIES/ML   Final    Culture ESCHERICHIA COLI   Final    Report Status 06/10/2011 FINAL   Final    Organism ID, Bacteria ESCHERICHIA COLI   Final  Studies/Results: Dg Lumbar Spine Complete  06/14/2011  *RADIOLOGY REPORT*  Clinical Data: 60 year old male with low back pain following fall.  LUMBAR SPINE - COMPLETE 4+ VIEW  Comparison: 06/08/2011  Findings: Five non-rib bearing lumbar type vertebra are again identified in normal alignment. A 50% T12 compression fracture appears stable since the prior study.  Minimal compression of the L1 inferior endplate is unchanged as well. There is no evidence of acute fracture or subluxation. Mild degenerative disc disease at T11-T12 and L2-L3 are again noted. Bilateral L5 pars defects  are again noted without spondylolisthesis.  IMPRESSION: No evidence of acute bony abnormality.  Unchanged T12 compression fracture and mild L1 inferior endplate compression.  Bilateral L5 pars defects without spondylolisthesis.  Original Report Authenticated By: Rosendo Gros, M.D.   Ct Head Wo Contrast  06/14/2011  *RADIOLOGY REPORT*  Clinical Data:  60 year old male with headache and neck pain following fall.  CT HEAD WITHOUT CONTRAST CT CERVICAL SPINE WITHOUT CONTRAST  Technique:  Multidetector CT imaging of the head and cervical spine was performed following the standard protocol without intravenous contrast.  Multiplanar CT image reconstructions of the cervical spine were also generated.  Comparison:  None  CT HEAD  Findings: No acute intracranial abnormalities are identified, including mass lesion or mass effect, hydrocephalus, extra-axial fluid collection, midline shift, hemorrhage, or acute infarction.  The visualized bony calvarium is unremarkable.  IMPRESSION: No evidence of acute intracranial abnormality.  CT CERVICAL SPINE  Findings: Normal alignment is noted. There is no evidence of fracture, subluxation or prevertebral soft tissue swelling. Moderate degenerative disc disease, spondylosis and broad-based disc bulge at C4-C5 noted contributing to moderate central spinal and biforaminal narrowing. The disc spaces are otherwise maintained. No focal bony lesions are noted. No soft tissue abnormalities are noted.  IMPRESSION: No static evidence of acute injury to the cervical spine.  Moderate degenerative changes at C4-C5 contributing to moderate central spinal biforaminal narrowing.  Original Report Authenticated By: Rosendo Gros, M.D.   Ct Cervical Spine Wo Contrast  06/14/2011  *RADIOLOGY REPORT*  Clinical Data:  60 year old male with headache and neck pain following fall.  CT HEAD WITHOUT CONTRAST CT CERVICAL SPINE WITHOUT CONTRAST  Technique:  Multidetector CT imaging of the head and cervical  spine was performed following the standard protocol without intravenous contrast.  Multiplanar CT image reconstructions of the cervical spine were also generated.  Comparison:  None  CT HEAD  Findings: No acute intracranial abnormalities are identified, including mass lesion or mass effect, hydrocephalus, extra-axial fluid collection, midline shift, hemorrhage, or acute infarction.  The visualized bony calvarium is unremarkable.  IMPRESSION: No evidence of acute intracranial abnormality.  CT CERVICAL SPINE  Findings: Normal alignment is noted. There is no evidence of fracture, subluxation or prevertebral soft tissue swelling. Moderate degenerative disc disease, spondylosis and broad-based disc bulge at C4-C5 noted contributing to moderate central spinal and biforaminal narrowing. The disc spaces are otherwise maintained. No focal bony lesions are noted. No soft tissue abnormalities are noted.  IMPRESSION: No static evidence of acute injury to the cervical spine.  Moderate degenerative changes at C4-C5 contributing to moderate central spinal biforaminal narrowing.  Original Report Authenticated By: Rosendo Gros, M.D.    Medications: Scheduled Meds:   . sodium chloride   Intravenous STAT  . aspirin EC  81 mg Oral Daily  . imipramine  200 mg Oral QHS  . insulin aspart  0-15 Units Subcutaneous TID WC  . insulin aspart  0-5 Units Subcutaneous QHS  .  morphine injection  4 mg Intravenous Once   Continuous Infusions:  PRN Meds:.acetaminophen, acetaminophen, alum & mag hydroxide-simeth, cyclobenzaprine, diazepam, HYDROcodone-acetaminophen, HYDROmorphone (DILAUDID) injection, morphine, ondansetron (ZOFRAN) IV, ondansetron (ZOFRAN) IV, ondansetron, sodium chloride, DISCONTD:  HYDROmorphone (DILAUDID) injection  Assessment/Plan: 1. T12 compression fracture with intractable back pain: 3rd visit to ER Use Norco and dilaudid PRN for pain control Flexeril PRN for muscle spasms, consult spine surgery, unclear if  he would benefit from kyphoplasty Physical therapy 2.Cirrhosis: etiology unclear, hepatitis serology pending, cryptogenic  Follow up with GI as out patient for further work up and management 3. Diabetes mellitus: ISS, check Aic 4.HTN (hypertension) 5. DVt prophylaxis: SCDs    LOS: 1 day   Candido Flott 06/15/2011, 9:16 AM

## 2011-06-15 NOTE — ED Notes (Signed)
Calling 6th floor to give report.

## 2011-06-15 NOTE — H&P (Addendum)
Garrett Turner is an 60 y.o. male.   Chief Complaint: Patient experienced fall initially at work.  Told he had a T12 fracture and was medicated for same and discharged from ED.  Patient with lightheadedness experienced a second fall with increased exacerbation of pain affecting ability to ambulate. Pain meds only mildy affecting pain - mostly causing mental status changes.  HPI: Fall x 2 with compression fractures seen on CT scan of T12 and L1 without evidence of retropulsion.  Patient denies bowel or bladder incontinence.  He has some constipation since starting pain meds. He denies any radicular symptoms down the legs.  He does express a significant amount of pain in his upper lumbar back with movement, cough or prolonged standing, sitting.  Lying supine does assist some with pain.  Rates pain as an 8-9 on a scale of 1-10 without pain medications.  No history of malignancy to suggest pathological underlying condition.   Past Medical History  Diagnosis Date  . Diabetes mellitus   . Hypertension   . High cholesterol     Past Surgical History  Procedure Date  . Tonsillectomy     Family History  Problem Relation Age of Onset  . Coronary artery disease    . Hypertension    . Heart failure Father   . Heart failure Mother   . Stroke Paternal Grandmother   . Hypertension Mother   . Hypertension Father    Social History:  reports that he has been smoking Cigarettes.  He does not have any smokeless tobacco history on file. He reports that he drinks about 1.1 ounces of alcohol per week. He reports that he does not use illicit drugs.  Allergies: No Known Allergies  Medications Prior to Admission  Medication Dose Route Frequency Provider Last Rate Last Dose  . 0.9 %  sodium chloride infusion   Intravenous STAT Raeford Razor, MD 50 mL/hr at 06/14/11 2139    . alum & mag hydroxide-simeth (MAALOX/MYLANTA) 200-200-20 MG/5ML suspension 30 mL  30 mL Oral Q6H PRN Debby Crosley, MD      .  cyclobenzaprine (FLEXERIL) tablet 5 mg  5 mg Oral Q12H PRN Debby Crosley, MD      . HYDROcodone-acetaminophen (NORCO) 5-325 MG per tablet 1-2 tablet  1-2 tablet Oral Q4H PRN Gery Pray, MD   2 tablet at 06/15/11 0846  . HYDROmorphone (DILAUDID) injection 1 mg  1 mg Intravenous Q4H PRN Gery Pray, MD   1 mg at 06/15/11 0243  . imipramine (TOFRANIL) tablet 100 mg  100 mg Oral QHS Zannie Cove      . insulin aspart (novoLOG) injection 0-15 Units  0-15 Units Subcutaneous TID WC Gery Pray, MD   2 Units at 06/15/11 0845  . insulin aspart (novoLOG) injection 0-5 Units  0-5 Units Subcutaneous QHS Debby Crosley, MD      . morphine 2 MG/ML injection 1 mg  1 mg Intravenous Q4H PRN Debby Crosley, MD      . morphine 4 MG/ML injection 4 mg  4 mg Intravenous Once Raeford Razor, MD   4 mg at 06/14/11 2138  . ondansetron (ZOFRAN) injection 4 mg  4 mg Intravenous Q8H PRN Raeford Razor, MD   4 mg at 06/14/11 2138  . ondansetron (ZOFRAN) tablet 4 mg  4 mg Oral Q6H PRN Debby Crosley, MD       Or  . ondansetron (ZOFRAN) injection 4 mg  4 mg Intravenous Q6H PRN Gery Pray, MD      .  sodium chloride 0.9 % injection 3 mL  3 mL Intravenous PRN Gery Pray, MD      . DISCONTD: acetaminophen (TYLENOL) suppository 650 mg  650 mg Rectal Q6H PRN Gery Pray, MD      . DISCONTD: acetaminophen (TYLENOL) tablet 650 mg  650 mg Oral Q6H PRN Debby Crosley, MD      . DISCONTD: aspirin EC tablet 81 mg  81 mg Oral Daily Debby Crosley, MD      . DISCONTD: diazepam (VALIUM) tablet 5 mg  5 mg Oral Q12H PRN Debby Crosley, MD      . DISCONTD: HYDROmorphone (DILAUDID) injection 1 mg  1 mg Intravenous Q4H PRN Raeford Razor, MD      . DISCONTD: imipramine (TOFRANIL) tablet 200 mg  200 mg Oral QHS Debby Crosley, MD       Medications Prior to Admission  Medication Sig Dispense Refill  . aspirin EC 81 MG tablet Take 81 mg by mouth daily.       . ciprofloxacin (CIPRO) 500 MG tablet Take 500 mg by mouth 2 (two) times daily. 14  day course of therapy; not completed.      . diazepam (VALIUM) 5 MG tablet Take 1 tablet (5 mg total) by mouth every 8 (eight) hours as needed for anxiety.  15 tablet  0  . HYDROcodone-acetaminophen (VICODIN) 5-500 MG per tablet Take 1 tablet by mouth every 4 (four) hours as needed for pain.  20 tablet  0  . imipramine (TOFRANIL) 50 MG tablet Take 200 mg by mouth at bedtime.        . naproxen sodium (ANAPROX) 220 MG tablet Take 440 mg by mouth 2 (two) times daily with a meal. For back pain      . oxyCODONE-acetaminophen (PERCOCET) 5-325 MG per tablet Take 1 tablet by mouth every 4 (four) hours as needed for pain.  45 tablet  0    Results for orders placed during the hospital encounter of 06/14/11 (from the past 48 hour(s))  CBC     Status: Abnormal   Collection Time   06/14/11  2:03 PM      Component Value Range Comment   WBC 4.8  4.0 - 10.5 (K/uL)    RBC 3.86 (*) 4.22 - 5.81 (MIL/uL)    Hemoglobin 11.7 (*) 13.0 - 17.0 (g/dL)    HCT 16.1 (*) 09.6 - 52.0 (%)    MCV 88.1  78.0 - 100.0 (fL)    MCH 30.3  26.0 - 34.0 (pg)    MCHC 34.4  30.0 - 36.0 (g/dL)    RDW 04.5  40.9 - 81.1 (%)    Platelets 57 (*) 150 - 400 (K/uL) PLATELET COUNT CONFIRMED BY SMEAR  COMPREHENSIVE METABOLIC PANEL     Status: Abnormal   Collection Time   06/14/11  2:03 PM      Component Value Range Comment   Sodium 135  135 - 145 (mEq/L)    Potassium 4.6  3.5 - 5.1 (mEq/L)    Chloride 102  96 - 112 (mEq/L)    CO2 27  19 - 32 (mEq/L)    Glucose, Bld 250 (*) 70 - 99 (mg/dL)    BUN 16  6 - 23 (mg/dL)    Creatinine, Ser 9.14  0.50 - 1.35 (mg/dL)    Calcium 9.5  8.4 - 10.5 (mg/dL)    Total Protein 6.8  6.0 - 8.3 (g/dL)    Albumin 3.1 (*) 3.5 - 5.2 (g/dL)  AST 30  0 - 37 (U/L)    ALT 17  0 - 53 (U/L)    Alkaline Phosphatase 119 (*) 39 - 117 (U/L)    Total Bilirubin 1.0  0.3 - 1.2 (mg/dL)    GFR calc non Af Amer 87 (*) >90 (mL/min)    GFR calc Af Amer >90  >90 (mL/min)   ETHANOL     Status: Normal   Collection Time    06/14/11  2:03 PM      Component Value Range Comment   Alcohol, Ethyl (B) <11  0 - 11 (mg/dL)   URINE RAPID DRUG SCREEN (HOSP PERFORMED)     Status: Abnormal   Collection Time   06/14/11  3:53 PM      Component Value Range Comment   Opiates NONE DETECTED  NONE DETECTED     Cocaine NONE DETECTED  NONE DETECTED     Benzodiazepines POSITIVE (*) NONE DETECTED     Amphetamines NONE DETECTED  NONE DETECTED     Tetrahydrocannabinol NONE DETECTED  NONE DETECTED     Barbiturates NONE DETECTED  NONE DETECTED    AMMONIA     Status: Normal   Collection Time   06/14/11  9:20 PM      Component Value Range Comment   Ammonia 54  11 - 60 (umol/L)   COMPREHENSIVE METABOLIC PANEL     Status: Abnormal   Collection Time   06/15/11  4:52 AM      Component Value Range Comment   Sodium 137  135 - 145 (mEq/L)    Potassium 3.8  3.5 - 5.1 (mEq/L)    Chloride 105  96 - 112 (mEq/L)    CO2 25  19 - 32 (mEq/L)    Glucose, Bld 142 (*) 70 - 99 (mg/dL)    BUN 14  6 - 23 (mg/dL)    Creatinine, Ser 1.61  0.50 - 1.35 (mg/dL)    Calcium 9.1  8.4 - 10.5 (mg/dL)    Total Protein 6.5  6.0 - 8.3 (g/dL)    Albumin 2.8 (*) 3.5 - 5.2 (g/dL)    AST 30  0 - 37 (U/L)    ALT 16  0 - 53 (U/L)    Alkaline Phosphatase 124 (*) 39 - 117 (U/L)    Total Bilirubin 0.6  0.3 - 1.2 (mg/dL)    GFR calc non Af Amer >90  >90 (mL/min)    GFR calc Af Amer >90  >90 (mL/min)   CBC     Status: Abnormal   Collection Time   06/15/11  4:52 AM      Component Value Range Comment   WBC 3.4 (*) 4.0 - 10.5 (K/uL)    RBC 3.81 (*) 4.22 - 5.81 (MIL/uL)    Hemoglobin 11.6 (*) 13.0 - 17.0 (g/dL)    HCT 09.6 (*) 04.5 - 52.0 (%)    MCV 86.6  78.0 - 100.0 (fL)    MCH 30.4  26.0 - 34.0 (pg)    MCHC 35.2  30.0 - 36.0 (g/dL)    RDW 40.9  81.1 - 91.4 (%)    Platelets 54 (*) 150 - 400 (K/uL)   AMMONIA     Status: Abnormal   Collection Time   06/15/11  4:52 AM      Component Value Range Comment   Ammonia 76 (*) 11 - 60 (umol/L)   GLUCOSE,  CAPILLARY     Status: Abnormal   Collection Time   06/15/11  7:45 AM      Component Value Range Comment   Glucose-Capillary 133 (*) 70 - 99 (mg/dL)   URINALYSIS, ROUTINE W REFLEX MICROSCOPIC     Status: Normal   Collection Time   06/15/11  8:31 AM      Component Value Range Comment   Color, Urine YELLOW  YELLOW     Appearance CLEAR  CLEAR     Specific Gravity, Urine 1.017  1.005 - 1.030     pH 6.5  5.0 - 8.0     Glucose, UA NEGATIVE  NEGATIVE (mg/dL)    Hgb urine dipstick NEGATIVE  NEGATIVE     Bilirubin Urine NEGATIVE  NEGATIVE     Ketones, ur NEGATIVE  NEGATIVE (mg/dL)    Protein, ur NEGATIVE  NEGATIVE (mg/dL)    Urobilinogen, UA 1.0  0.0 - 1.0 (mg/dL)    Nitrite NEGATIVE  NEGATIVE     Leukocytes, UA NEGATIVE  NEGATIVE  MICROSCOPIC NOT DONE ON URINES WITH NEGATIVE PROTEIN, BLOOD, LEUKOCYTES, NITRITE, OR GLUCOSE <1000 mg/dL.  GLUCOSE, CAPILLARY     Status: Abnormal   Collection Time   06/15/11 12:02 PM      Component Value Range Comment   Glucose-Capillary 207 (*) 70 - 99 (mg/dL)    Dg Lumbar Spine Complete  06/14/2011  *RADIOLOGY REPORT*  Clinical Data: 60 year old male with low back pain following fall.  LUMBAR SPINE - COMPLETE 4+ VIEW  Comparison: 06/08/2011  Findings: Five non-rib bearing lumbar type vertebra are again identified in normal alignment. A 50% T12 compression fracture appears stable since the prior study.  Minimal compression of the L1 inferior endplate is unchanged as well. There is no evidence of acute fracture or subluxation. Mild degenerative disc disease at T11-T12 and L2-L3 are again noted. Bilateral L5 pars defects are again noted without spondylolisthesis.  IMPRESSION: No evidence of acute bony abnormality.  Unchanged T12 compression fracture and mild L1 inferior endplate compression.  Bilateral L5 pars defects without spondylolisthesis.  Original Report Authenticated By: Rosendo Gros, M.D.   Ct Head Wo Contrast  06/14/2011  *RADIOLOGY REPORT*  Clinical  Data:  60 year old male with headache and neck pain following fall.  CT HEAD WITHOUT CONTRAST CT CERVICAL SPINE WITHOUT CONTRAST  Technique:  Multidetector CT imaging of the head and cervical spine was performed following the standard protocol without intravenous contrast.  Multiplanar CT image reconstructions of the cervical spine were also generated.  Comparison:  None  CT HEAD  Findings: No acute intracranial abnormalities are identified, including mass lesion or mass effect, hydrocephalus, extra-axial fluid collection, midline shift, hemorrhage, or acute infarction.  The visualized bony calvarium is unremarkable.  IMPRESSION: No evidence of acute intracranial abnormality.  CT CERVICAL SPINE  Findings: Normal alignment is noted. There is no evidence of fracture, subluxation or prevertebral soft tissue swelling. Moderate degenerative disc disease, spondylosis and broad-based disc bulge at C4-C5 noted contributing to moderate central spinal and biforaminal narrowing. The disc spaces are otherwise maintained. No focal bony lesions are noted. No soft tissue abnormalities are noted.  IMPRESSION: No static evidence of acute injury to the cervical spine.  Moderate degenerative changes at C4-C5 contributing to moderate central spinal biforaminal narrowing.  Original Report Authenticated By: Rosendo Gros, M.D.   Ct Cervical Spine Wo Contrast  06/14/2011  *RADIOLOGY REPORT*  Clinical Data:  60 year old male with headache and neck pain following fall.  CT HEAD WITHOUT CONTRAST CT CERVICAL SPINE WITHOUT CONTRAST  Technique:  Multidetector CT imaging of the  head and cervical spine was performed following the standard protocol without intravenous contrast.  Multiplanar CT image reconstructions of the cervical spine were also generated.  Comparison:  None  CT HEAD  Findings: No acute intracranial abnormalities are identified, including mass lesion or mass effect, hydrocephalus, extra-axial fluid collection, midline shift,  hemorrhage, or acute infarction.  The visualized bony calvarium is unremarkable.  IMPRESSION: No evidence of acute intracranial abnormality.  CT CERVICAL SPINE  Findings: Normal alignment is noted. There is no evidence of fracture, subluxation or prevertebral soft tissue swelling. Moderate degenerative disc disease, spondylosis and broad-based disc bulge at C4-C5 noted contributing to moderate central spinal and biforaminal narrowing. The disc spaces are otherwise maintained. No focal bony lesions are noted. No soft tissue abnormalities are noted.  IMPRESSION: No static evidence of acute injury to the cervical spine.  Moderate degenerative changes at C4-C5 contributing to moderate central spinal biforaminal narrowing.  Original Report Authenticated By: Rosendo Gros, M.D.    Review of Systems  Constitutional: Negative for fever and chills.       Weakness secondary to pain and sedation due to pain meds   Respiratory: Negative.   Cardiovascular: Negative.   Gastrointestinal: Negative.   Musculoskeletal: Positive for myalgias, back pain and falls.  Neurological: Positive for sensory change and weakness.  Endo/Heme/Allergies: Negative.   Psychiatric/Behavioral: Negative.     Blood pressure 134/75, pulse 71, temperature 98.3 F (36.8 C), temperature source Oral, resp. rate 20, height 6\' 2"  (1.88 m), weight 208 lb 12.4 oz (94.7 kg), SpO2 97.00%. Physical Exam  Constitutional: He appears well-developed and well-nourished.  HENT:       Abrasions with edema, erythema, ecchymosis involving left side of face and orbital area.   Cardiovascular: Normal rate, regular rhythm and normal heart sounds.  Exam reveals no gallop and no friction rub.   No murmur heard. Respiratory: Effort normal and breath sounds normal.  Musculoskeletal: He exhibits tenderness.       Extremely tender to palpation over spine consistent with T12, L1 area.  Patient able to move all extremities equally with sensation intact.   Patient expresses pain with cough, movement in bed.  Neurological: He is alert.  Skin: Skin is warm and dry.  Psychiatric: Thought content normal.     Procedure for Vertebroplasty and kyphoplasty discussed with patient in detail.  Procedure details reviewed as well as potential complications including but not limited to infection, bleeding, extravasation of methylmethacrylate and poor pain control.  Benefits discussed would be that of expected pain reduction, reduction in spasms and stabilization of compression fractures, minimizing down/recovery time.  Patient has expressed his interest in moving forward.  I do have an MD available Tues. 06/16/11 who could perform if insurance clears - likely a workman's compensation case.  MRI may be warranted if Dr. Deanne Coffer would like prior - patient aware he may need to undergo prior to procedure if Dr.Hassell wishes. Will go ahead and order labs for AM with intention to proceed as patient wishes.  Assessment/Plan Acute compression fractures of T12, L1 with poor pain control.  Appears adequate candidate for VP/KP.  MRI only if indicated in am by radiologist.  Awaiting insurance approval at this time. Tentatively planned for 06/16/11.   Time spent with patient approximately 30 minutes.   CAMPBELL,PAMELA D 06/15/2011, 2:20 PM

## 2011-06-15 NOTE — Progress Notes (Signed)
UR CHART REVIEWED 

## 2011-06-16 ENCOUNTER — Inpatient Hospital Stay (HOSPITAL_COMMUNITY): Payer: Worker's Compensation

## 2011-06-16 ENCOUNTER — Other Ambulatory Visit (HOSPITAL_COMMUNITY): Payer: Self-pay

## 2011-06-16 DIAGNOSIS — N390 Urinary tract infection, site not specified: Secondary | ICD-10-CM | POA: Diagnosis present

## 2011-06-16 LAB — GLUCOSE, CAPILLARY
Glucose-Capillary: 227 mg/dL — ABNORMAL HIGH (ref 70–99)
Glucose-Capillary: 136 mg/dL — ABNORMAL HIGH (ref 70–99)
Glucose-Capillary: 114 mg/dL — ABNORMAL HIGH (ref 70–99)
Glucose-Capillary: 178 mg/dL — ABNORMAL HIGH (ref 70–99)

## 2011-06-16 LAB — CBC
HCT: 32.4 % — ABNORMAL LOW (ref 39.0–52.0)
Hemoglobin: 11.1 g/dL — ABNORMAL LOW (ref 13.0–17.0)
MCV: 87.8 fL (ref 78.0–100.0)
WBC: 3.5 10*3/uL — ABNORMAL LOW (ref 4.0–10.5)

## 2011-06-16 LAB — HEPATITIS PANEL, ACUTE
HCV Ab: NEGATIVE
Hep A IgM: NEGATIVE
Hep B C IgM: NEGATIVE

## 2011-06-16 LAB — AMMONIA: Ammonia: 70 umol/L — ABNORMAL HIGH (ref 11–60)

## 2011-06-16 MED ORDER — SODIUM CHLORIDE 0.9 % IV SOLN
INTRAVENOUS | Status: AC | PRN
  Administered 2011-06-16: 50 mL/h via INTRAVENOUS

## 2011-06-16 MED ORDER — MIDAZOLAM HCL 2 MG/2ML IJ SOLN
INTRAMUSCULAR | Status: AC
  Administered 2011-06-16: 1 mg via INTRAVENOUS
  Filled 2011-06-16: qty 6

## 2011-06-16 MED ORDER — CIPROFLOXACIN HCL 500 MG PO TABS
500.0000 mg | ORAL_TABLET | Freq: Two times a day (BID) | ORAL | Status: DC
  Administered 2011-06-16 – 2011-06-20 (×9): 500 mg via ORAL
  Filled 2011-06-16 (×11): qty 1

## 2011-06-16 MED ORDER — FENTANYL CITRATE 0.05 MG/ML IJ SOLN
INTRAMUSCULAR | Status: AC
  Administered 2011-06-16: 50 ug via INTRAVENOUS
  Filled 2011-06-16: qty 4

## 2011-06-16 MED ORDER — GADOBENATE DIMEGLUMINE 529 MG/ML IV SOLN
20.0000 mL | Freq: Once | INTRAVENOUS | Status: AC | PRN
  Administered 2011-06-16: 18 mL via INTRAVENOUS

## 2011-06-16 MED ORDER — MIDAZOLAM HCL 5 MG/5ML IJ SOLN
INTRAMUSCULAR | Status: AC | PRN
  Administered 2011-06-16: 1 mg via INTRAVENOUS

## 2011-06-16 MED ORDER — CEFAZOLIN SODIUM 1-5 GM-% IV SOLN
1.0000 g | Freq: Once | INTRAVENOUS | Status: AC
Start: 2011-06-16 — End: 2011-06-16
  Administered 2011-06-16: 1 g via INTRAVENOUS
  Filled 2011-06-16 (×2): qty 50

## 2011-06-16 MED ORDER — FENTANYL CITRATE 0.05 MG/ML IJ SOLN
INTRAMUSCULAR | Status: AC | PRN
  Administered 2011-06-16: 50 ug via INTRAVENOUS

## 2011-06-16 NOTE — ED Notes (Signed)
Patient is resting comfortably. 

## 2011-06-16 NOTE — Progress Notes (Signed)
Patient's wife notified of pt's return to room by Victorino Sparrow

## 2011-06-16 NOTE — Progress Notes (Addendum)
Subjective: Back pain dull now with all the medications, spasms better  Objective: Vital signs in last 24 hours: Temp:  [97.9 F (36.6 C)-98.1 F (36.7 C)] 97.9 F (36.6 C) (11/27 0700) Pulse Rate:  [75-85] 75  (11/27 0700) Resp:  [18-20] 18  (11/27 0700) BP: (118-147)/(71-79) 118/71 mmHg (11/27 0700) SpO2:  [96 %-98 %] 96 % (11/27 0700) Weight change:  Last BM Date: 06/14/11  Intake/Output from previous day: 11/26 0701 - 11/27 0700 In: 600 [P.O.:600] Out: 1450 [Urine:1450]   Physical Exam: General: Alert, awake, oriented x3, in no acute distress. HEENT: No bruits, no goiter. Heart: Regular rate and rhythm, without murmurs, rubs, gallops. Lungs: Clear to auscultation bilaterally. Abdomen: Soft, nontender, nondistended, positive bowel sounds. Extremities: No clubbing cyanosis or edema with positive pedal pulses,  Back: tender over lower thoracic spine Neuro: Grossly intact, nonfocal.  Lab Results: Basic Metabolic Panel:  Basename 06/15/11 0452 06/14/11 1403  NA 137 135  K 3.8 4.6  CL 105 102  CO2 25 27  GLUCOSE 142* 250*  BUN 14 16  CREATININE 0.80 0.97  CALCIUM 9.1 9.5  MG -- --  PHOS -- --   Liver Function Tests:  [facility] 06/15/11 0452 06/14/11 1403  AST 30 30  ALT 16 17  ALKPHOS 124* 119*  BILITOT 0.6 1.0  PROT 6.5 6.8  ALBUMIN 2.8* 3.1*   No results found for this basename: LIPASE:2,AMYLASE:2 in the last 72 hours  Basename 06/16/11 0439 06/15/11 0452  AMMONIA 70* 76*   CBC:  Basename 06/16/11 0437 06/15/11 1514  WBC 3.5* 3.5*  NEUTROABS -- --  HGB 11.1* 11.2*  HCT 32.4* 32.8*  MCV 87.8 87.9  PLT 55* 48*   Cardiac Enzymes: No results found for this basename: CKTOTAL:3,CKMB:3,CKMBINDEX:3,TROPONINI:3 in the last 72 hours BNP: No results found for this basename: POCBNP:3 in the last 72 hours D-Dimer: No results found for this basename: DDIMER:2 in the last 72 hours CBG:  Basename 06/16/11 0744 06/15/11 2158 06/15/11 1811 06/15/11 1202  06/15/11 0745  GLUCAP 136* 227* 242* 207* 133*   Hemoglobin A1C: No results found for this basename: HGBA1C in the last 72 hours Fasting Lipid Panel: No results found for this basename: CHOL,HDL,LDLCALC,TRIG,CHOLHDL,LDLDIRECT in the last 72 hours Thyroid Function Tests: No results found for this basename: TSH,T4TOTAL,FREET4,T3FREE,THYROIDAB in the last 72 hours Anemia Panel: No results found for this basename: VITAMINB12,FOLATE,FERRITIN,TIBC,IRON,RETICCTPCT in the last 72 hours Coagulation:  Basename 06/16/11 0437  LABPROT 15.6*  INR 1.21   Urine Drug Screen: Drugs of Abuse     Component Value Date/Time   LABOPIA NONE DETECTED 06/14/2011 1553   COCAINSCRNUR NONE DETECTED 06/14/2011 1553   LABBENZ POSITIVE* 06/14/2011 1553   AMPHETMU NONE DETECTED 06/14/2011 1553   THCU NONE DETECTED 06/14/2011 1553   LABBARB NONE DETECTED 06/14/2011 1553    Alcohol Level:  Basename 06/14/11 1403  ETH <11    Recent Results (from the past 240 hour(s))  URINE CULTURE     Status: Normal   Collection Time   06/08/11  6:03 PM      Component Value Range Status Comment   Specimen Description URINE, CLEAN CATCH   Final    Special Requests NONE   Final    Setup Time [id number]   Final    Colony Count >=100,000 COLONIES/ML   Final    Culture ESCHERICHIA COLI   Final    Report Status 06/10/2011 FINAL   Final    Organism ID, Bacteria ESCHERICHIA COLI   Final  Studies/Results: Dg Lumbar Spine Complete  06/14/2011  *RADIOLOGY REPORT*  Clinical Data: 60 year old male with low back pain following fall.  LUMBAR SPINE - COMPLETE 4+ VIEW  Comparison: 06/08/2011  Findings: Five non-rib bearing lumbar type vertebra are again identified in normal alignment. A 50% T12 compression fracture appears stable since the prior study.  Minimal compression of the L1 inferior endplate is unchanged as well. There is no evidence of acute fracture or subluxation. Mild degenerative disc disease at T11-T12 and L2-L3  are again noted. Bilateral L5 pars defects are again noted without spondylolisthesis.  IMPRESSION: No evidence of acute bony abnormality.  Unchanged T12 compression fracture and mild L1 inferior endplate compression.  Bilateral L5 pars defects without spondylolisthesis.  Original Report Authenticated By: Rosendo Gros, M.D.   Ct Head Wo Contrast  06/14/2011  *RADIOLOGY REPORT*  Clinical Data:  60 year old male with headache and neck pain following fall.  CT HEAD WITHOUT CONTRAST CT CERVICAL SPINE WITHOUT CONTRAST  Technique:  Multidetector CT imaging of the head and cervical spine was performed following the standard protocol without intravenous contrast.  Multiplanar CT image reconstructions of the cervical spine were also generated.  Comparison:  None  CT HEAD  Findings: No acute intracranial abnormalities are identified, including mass lesion or mass effect, hydrocephalus, extra-axial fluid collection, midline shift, hemorrhage, or acute infarction.  The visualized bony calvarium is unremarkable.  IMPRESSION: No evidence of acute intracranial abnormality.  CT CERVICAL SPINE  Findings: Normal alignment is noted. There is no evidence of fracture, subluxation or prevertebral soft tissue swelling. Moderate degenerative disc disease, spondylosis and broad-based disc bulge at C4-C5 noted contributing to moderate central spinal and biforaminal narrowing. The disc spaces are otherwise maintained. No focal bony lesions are noted. No soft tissue abnormalities are noted.  IMPRESSION: No static evidence of acute injury to the cervical spine.  Moderate degenerative changes at C4-C5 contributing to moderate central spinal biforaminal narrowing.  Original Report Authenticated By: Rosendo Gros, M.D.   Ct Cervical Spine Wo Contrast  06/14/2011  *RADIOLOGY REPORT*  Clinical Data:  60 year old male with headache and neck pain following fall.  CT HEAD WITHOUT CONTRAST CT CERVICAL SPINE WITHOUT CONTRAST  Technique:   Multidetector CT imaging of the head and cervical spine was performed following the standard protocol without intravenous contrast.  Multiplanar CT image reconstructions of the cervical spine were also generated.  Comparison:  None  CT HEAD  Findings: No acute intracranial abnormalities are identified, including mass lesion or mass effect, hydrocephalus, extra-axial fluid collection, midline shift, hemorrhage, or acute infarction.  The visualized bony calvarium is unremarkable.  IMPRESSION: No evidence of acute intracranial abnormality.  CT CERVICAL SPINE  Findings: Normal alignment is noted. There is no evidence of fracture, subluxation or prevertebral soft tissue swelling. Moderate degenerative disc disease, spondylosis and broad-based disc bulge at C4-C5 noted contributing to moderate central spinal and biforaminal narrowing. The disc spaces are otherwise maintained. No focal bony lesions are noted. No soft tissue abnormalities are noted.  IMPRESSION: No static evidence of acute injury to the cervical spine.  Moderate degenerative changes at C4-C5 contributing to moderate central spinal biforaminal narrowing.  Original Report Authenticated By: Rosendo Gros, M.D.    Medications: Scheduled Meds:    . ceFAZolin (ANCEF) IV  1 g Intravenous Once  . imipramine  100 mg Oral QHS  . insulin aspart  0-15 Units Subcutaneous TID WC  . insulin aspart  0-5 Units Subcutaneous QHS   Continuous Infusions:  PRN  Meds:.alum & mag hydroxide-simeth, cyclobenzaprine, HYDROcodone-acetaminophen, HYDROmorphone (DILAUDID) injection, morphine, ondansetron (ZOFRAN) IV, ondansetron, sodium chloride  Assessment/Plan: 1. T12/L1 compression fracture with intractable back pain: 3rd visit to ER Use Norco and dilaudid PRN for pain control Flexeril PRN for muscle spasms, d/w Dr.Elsner yesterday who recommended IR evaluation for Kyphoplasty Appreciate IR eval, plan for Kyphoplasty today Physical therapy 2.Cirrhosis: etiology  unclear, hepatitis serology pending, cryptogenic  Follow up with GI as out patient for further work up and management 3. 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 Diabetes mellitus: Aic pending, will need additional oral hypoglycemics at discharge 5.HTN (hypertension) 6. Ecoli UTI: Ciprofloxacin for 3 more days, FU with Urology 7. DVt prophylaxis: SCDs    LOS: 2 days   Garrett Turner 06/16/2011, 10:44 AM

## 2011-06-16 NOTE — ED Notes (Signed)
Incision made

## 2011-06-16 NOTE — Procedures (Signed)
Kyphoplasty T12 No complication No blood loss. See complete dictation in [facility].

## 2011-06-16 NOTE — ED Notes (Signed)
02 dc'd

## 2011-06-17 LAB — GLUCOSE, CAPILLARY
Glucose-Capillary: 325 mg/dL — ABNORMAL HIGH (ref 70–99)
Glucose-Capillary: 126 mg/dL — ABNORMAL HIGH (ref 70–99)
Glucose-Capillary: 173 mg/dL — ABNORMAL HIGH (ref 70–99)

## 2011-06-17 LAB — FERRITIN: Ferritin: 101 ng/mL (ref 22–322)

## 2011-06-17 LAB — HEPATITIS B CORE ANTIBODY, TOTAL: Hep B Core Total Ab: NEGATIVE

## 2011-06-17 MED ORDER — SENNOSIDES-DOCUSATE SODIUM 8.6-50 MG PO TABS
2.0000 | ORAL_TABLET | Freq: Every evening | ORAL | Status: DC | PRN
  Administered 2011-06-18: 2 via ORAL
  Filled 2011-06-17 (×2): qty 2

## 2011-06-17 MED ORDER — POLYETHYLENE GLYCOL 3350 17 G PO PACK
17.0000 g | PACK | Freq: Every day | ORAL | Status: DC
  Administered 2011-06-17: 12:00:00 via ORAL
  Administered 2011-06-18 – 2011-06-20 (×3): 17 g via ORAL
  Filled 2011-06-17 (×4): qty 1

## 2011-06-17 NOTE — Progress Notes (Deleted)
Physical Therapy Treatment Patient Details Name: RAFI KENNETH MRN: [id number] DOB: May 20, 1951 Today's Date: 06/17/2011 1700-1720 g PT Assessment/Plan  PT - Assessment/Plan Comments on Treatment Session: pt continues with RLE dysfunction, knee buckle, knee hyperext., ankle inversion during stance. pt is risk for fall.  Pt is not at a functional level to safelyu DC, PT can benefit from CIR consult to return to [place], or SNF if CIR is not an option PT Plan: Discharge plan remains appropriate;Frequency remains appropriate PT Frequency: Min 5X/week Follow Up Recommendations: Inpatient Rehab Equipment Recommended: None recommended by PT PT Goals  Acute Rehab PT Goals PT Goal Formulation: With patient Time For Goal Achievement: 2 weeks Pt will Roll Supine to Right Side: Independently PT Goal: Rolling Supine to Right Side - Progress: Progressing toward goal Pt will Roll Supine to Left Side: Independently PT Goal: Rolling Supine to Left Side - Progress: Progressing toward goal Pt will go Supine/Side to Sit: Independently PT Goal: Supine/Side to Sit - Progress: Progressing toward goal Pt will go Sit to Supine/Side: Independently PT Goal: Sit to Supine/Side - Progress: Progressing toward goal Pt will Transfer Sit to Stand/Stand to Sit: with modified independence PT Transfer Goal: Sit to Stand/Stand to Sit - Progress: Progressing toward goal Pt will Ambulate: 51 - 150 feet;with supervision;with rolling walker PT Goal: Ambulate - Progress: Progressing toward goal Pt will Go Up / Down Stairs: 3-5 stairs;with min assist;with rail(s) PT Goal: Up/Down Stairs - Progress: Progressing toward goal  PT Treatment Precautions/Restrictions  Precautions Precautions: Fall (Rknee subject to buckling,decreased control RLE) Precaution Comments: Rknee buckles during stance, support at knee required, R foot inverts also along with knee hyperextension. Restrictions Weight Bearing Restrictions:  No Mobility (including Balance) Bed Mobility Bed Mobility: Yes Supine to Sit: HOB elevated (Comment degrees);3: Mod assist (at 50) Supine to Sit Details (indicate cue type and reason): vc for back protection Transfers Transfers: Yes Sit to Stand: 3: Mod assist;From chair/3-in-1;With upper extremity assist Sit to Stand Details (indicate cue type and reason): vc to push from armrests Stand to Sit: 4: Min assist;To chair/3-in-1;With armrests Stand to Sit Details: VC to reach back to armrests Ambulation/Gait Ambulation/Gait: Yes Ambulation/Gait Assistance: 3: Mod assist Ambulation/Gait Assistance Details (indicate cue type and reason): PT supported R knee to prevent buckling, noted R knee hyperextension with anlke inversion Ambulation Distance (Feet): 20 Feet (limited due to gait instability) Assistive device: Rolling walker Gait Pattern: Step-to pattern;Decreased hip/knee flexion - right;Right genu recurvatum (R ankle inversion) Gait velocity: slow, unsteady  Posture/Postural Control Posture/Postural Control: Postural limitations Postural Limitations: pt tends to lean forward when upright Exercise    End of Session PT - End of Session Equipment Utilized During Treatment: Gait belt Activity Tolerance: Patient limited by pain (limited by gait instability) Patient left: in chair;with call bell in reach Nurse Communication: Mobility status for transfers (need for R knee stability due to buckling) General Behavior During Session: [facility] for tasks performed Cognition: [facility] for tasks performed  Sticky not to MD to [place] PT for Gait during MD presence 06/18/11  Rada Hay 06/17/2011, 5:33 PM

## 2011-06-17 NOTE — Progress Notes (Addendum)
Patient ID: Garrett Turner, male   DOB: [birth year], 60 y.o.   MRN: [id number]    T12 KP 11/27 Pt feels better already. Pain has diminished. Able to sit up in bed and eat breakfast.  O:  Afeb; VSS Site clean and dry; NT; no bleeding; No sign of infection Neuro intact  A/P:  T12 KP 11/28 w/o complication Call us if need [place]. Plan per primary MD.

## 2011-06-17 NOTE — Progress Notes (Signed)
Physical Therapy Treatment Patient Details Name: Garrett Turner MRN: [id number] DOB: 1950-09-21 Today's Date: 06/17/2011 17:00-17:20 gt PT Assessment/Plan  PT - Assessment/Plan Comments on Treatment Session: pt continues with RLE dysfunction, knee buckle, knee hyperext., ankle inversion during stanc. pt is risk for fall.  Pt is not at a functional level to safelyu DC, PT can benefit from CIR cosult to return to [place], or SNF if CIR is not an option PT Plan: Discharge plan remains appropriate;Frequency remains appropriate PT Frequency: Min 5X/week Follow Up Recommendations: Inpatient Rehab Equipment Recommended: None recommended by PT PT Goals  Acute Rehab PT Goals PT Goal Formulation: With patient Time For Goal Achievement: 2 weeks Pt will Roll Supine to Right Side: Independently PT Goal: Rolling Supine to Right Side - Progress: Progressing toward goal Pt will Roll Supine to Left Side: Independently PT Goal: Rolling Supine to Left Side - Progress: Progressing toward goal Pt will go Supine/Side to Sit: Independently PT Goal: Supine/Side to Sit - Progress: Progressing toward goal Pt will go Sit to Supine/Side: Independently PT Goal: Sit to Supine/Side - Progress: Progressing toward goal Pt will Transfer Sit to Stand/Stand to Sit: with modified independence PT Transfer Goal: Sit to Stand/Stand to Sit - Progress: Progressing toward goal Pt will Ambulate: 51 - 150 feet;with supervision;with rolling walker PT Goal: Ambulate - Progress: Progressing toward goal Pt will Go Up / Down Stairs: 3-5 stairs;with min assist;with rail(s) PT Goal: Up/Down Stairs - Progress: Progressing toward goal  PT Treatment Precautions/Restrictions  Precautions Precautions: Fall (Rknee subject to buckling,decreased control RLE) Precaution Comments: Rknee buckles during stance, support at knee required, R foot inverts also Restrictions Weight Bearing Restrictions: No Mobility (including  Balance) Transfers Transfers: Yes Sit to Stand: 3: Mod assist;From chair/3-in-1;With upper extremity assist Sit to Stand Details (indicate cue type and reason): vc to push from armrests Stand to Sit: 4: Min assist;To chair/3-in-1;With armrests Stand to Sit Details: VC to reach back to armrests Ambulation/Gait Ambulation/Gait: Yes Ambulation/Gait Assistance: 3: Mod assist Ambulation/Gait Assistance Details (indicate cue type and reason): PT supported R knee to prevent buckling, noted R knee hyperextension with anlke inversion Ambulation Distance (Feet): 20 Feet (limited due to gait instability) Assistive device: Rolling walker Gait Pattern: Step-to pattern;Decreased hip/knee flexion - right;Right genu recurvatum (R ankle inversion) Gait velocity: slow, unsteady  Posture/Postural Control Posture/Postural Control: Postural limitations Postural Limitations: pt tends to lean forward when upright Exercise    End of Session PT - End of Session Equipment Utilized During Treatment: Gait belt Activity Tolerance: Patient limited by pain (limited by gait instability) Patient left: in chair;with call bell in reach Nurse Communication: Mobility status for transfers (need for R knee stability due to buckling) General Behavior During Session: [facility] for tasks performed Cognition: [facility] for tasks performed  Rada Hay 06/17/2011, 5:41 PM

## 2011-06-17 NOTE — Progress Notes (Signed)
Physical Therapy Evaluation Patient Details Name: Garrett Turner MRN: [id number] DOB: 09-04-1950 Today's Date: 06/17/2011 1140-1230EV2 Problem List:  Patient Active Problem List  Diagnoses  . Intractable pain  . Fall  . Diabetes mellitus  . HTN (hypertension)  . Hyperlipidemia  . Cirrhosis  . UTI (lower urinary tract infection)    Past Medical History:  Past Medical History  Diagnosis Date  . Diabetes mellitus   . Hypertension   . High cholesterol    Past Surgical History:  Past Surgical History  Procedure Date  . Tonsillectomy     PT Assessment/Plan/Recommendation PT Assessment Clinical Impression Statement: pt presents with falls, compression fx T12, s/p kyphoplasty on11/27/12. Pt now presents with impaired strength of RLE strength,decreased stability for walking, with RLE buckling during gait.  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  Pt currently is not safe for ambulation without assistance of RW and an attendant. PT Recommendation/Assessment: Patient will need skilled PT in the acute care venue PT Problem List: Decreased strength;Decreased activity tolerance;Decreased balance;Decreased mobility;Decreased knowledge of use of DME;Decreased safety awareness;Pain (impaired RLE function) Problem List Comments: concern for discharge with difficulty and safety for ambulation. Barriers to Discharge: Decreased caregiver support Barriers to Discharge Comments: has no assitance at home PT Therapy Diagnosis : Difficulty walking;Abnormality of gait;Acute pain PT Plan PT Frequency: Min 5X/week PT Treatment/Interventions: DME instruction;Gait training;Stair training;Functional mobility training;Therapeutic exercise;Patient/family education PT Recommendation Recommendations for Other Services: OT consult Follow Up Recommendations: Inpatient Rehab (pt is not safe for DC, pt wants to DC home, pt will consider) Equipment Recommended: None recommended by PT  (pt has  DME) PT Goals  Acute Rehab PT Goals PT Goal Formulation: With patient Time For Goal Achievement: 2 weeks Pt will Roll Supine to Right Side: Independently PT Goal: Rolling Supine to Right Side - Progress: Progressing toward goal Pt will Roll Supine to Left Side: Independently PT Goal: Rolling Supine to Left Side - Progress: Progressing toward goal Pt will go Supine/Side to Sit: Independently PT Goal: Supine/Side to Sit - Progress: Progressing toward goal Pt will go Sit to Supine/Side: Independently PT Goal: Sit to Supine/Side - Progress: Progressing toward goal Pt will Transfer Sit to Stand/Stand to Sit: with modified independence PT Transfer Goal: Sit to Stand/Stand to Sit - Progress: Progressing toward goal Pt will Ambulate: 51 - 150 feet;with supervision;with rolling walker PT Goal: Ambulate - Progress: Progressing toward goal Pt will Go Up / Down Stairs: 3-5 stairs;with min assist;with rail(s) PT Goal: Up/Down Stairs - Progress: Progressing toward goal  PT Evaluation Precautions/Restrictions  Precautions Precautions: Fall Restrictions Weight Bearing Restrictions: RLE is very weak Prior Functioning  Home Living Lives With: Spouse (who is disabled, pt takes care of wife) Type of Home: House Home Layout: One level Home Access: Stairs to enter Entrance Stairs-Rails: Right Entrance Stairs-Number of Steps: 2 Bathroom Shower/Tub: Health visitor: Handicapped height Bathroom Accessibility: Yes How Accessible: Accessible via walker Home Adaptive Equipment: Walker - rolling;Bedside commode/3-in-1;Grab bars in shower;Hand-held shower hose;Raised toilet seat with rails;Shower chair with back, wheelchair Prior Function Level of Independence: Independent with basic ADLs;Independent with homemaking with ambulation;Independent with gait;Independent with transfers Able to Take Stairs?: Yes Driving: Yes Vocation: Full time employment Comments: 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 Arousal/Alertness: Awake/alert Overall Cognitive Status: Appears within functional limits for tasks assessed Orientation Level: Oriented to events and place and findings for muliple medical issues Sensation/Coordination Sensation Light Touch: Impaired by gross assessment (pt  reports touch is less in both feet) Coordination Gross Motor Movements are Fluid and Coordinated: Yes Extremity Assessment RUE Assessment RUE Assessment: Exceptions to [facility] RUE Strength RUE Overall Strength Comments: pt has decreased strength of elbow flex/ext of 3+, , also noted R drift LUE Assessment LUE Assessment: Within Functional Limits RLE Assessment RLE Assessment: Exceptions to [facility] RLE AROM (degrees) RLE Overall AROM Comments: pt demos WFL ROM RLE Strength Right Hip Flexion: 2+/5 Right Knee Flexion: 3-/5 Right Knee Extension: 3-/5 Right Ankle Dorsiflexion: 3-/5 LLE Assessment LLE Assessment: Within Functional Limits Mobility (including Balance) Bed Mobility Bed Mobility: Yes Supine to Sit: HOB elevated (Comment degrees);3: Mod assist (at 50) Supine to Sit Details (indicate cue type and reason): vc for back protection Transfers Transfers: Yes Sit to Stand: 3: Mod assist;From bed;With upper extremity assist (pt staggerer with standing) Sit to Stand Details (indicate cue type and reason): pt stood at [facility] for meds times 3-4 minutes, became unsteady,and needed to sit down. Stand to Sit: 4: Min assist;To chair/3-in-1;With armrests Stand to Sit Details: VC to reach back to armrests Ambulation/Gait Ambulation/Gait: Yes Ambulation/Gait Assistance: 1: +2 Total assist (pt=60) Ambulation/Gait Assistance Details (indicate cue type and reason): pt very unsteady with gait. RLE buckled 3 times during  . pt required steady assist. amb. x40 ft.Marland Kitchen VC/TC for posture, tends to lean forward, stops to straighten in back slowly with c/o discomffort in back. Ambulation Distance  (Feet): 50 Feet Assistive device: Rolling walker Gait Pattern: Decreased step length - right;Decreased stance time - right;Decreased hip/knee flexion - right;Right genu recurvatum (R foot inverts with 75% of steps) Gait velocity: slow, unsteady  Posture/Postural Control Posture/Postural Control: Postural limitations Postural Limitations: pt tends to lean forward when upright Exercise    End of Session PT - End of Session Equipment Utilized During Treatment: Gait belt Activity Tolerance: Patient limited by pain (limited by RLE dysfunction) Patient left: in chair;with call bell in reach Nurse Communication: Mobility status for transfers (requires mod assist for ambulation) General Behavior During Session: [facility] for tasks performed Cognition: [facility] for tasks performed  Spoke with MD re: findings of RLE weakness and difficulties for ambulation, safety to discharge  Rada Hay 06/17/2011, 4:52 PM

## 2011-06-17 NOTE — Progress Notes (Signed)
Spoke with pt concerning discharge plans. Pt states, I  wants to go home, because my wife is disabled and  she needs my help with her medications. Pt agreed with Home Health if needed.  Will continue to follow for discharge needs.  mp

## 2011-06-17 NOTE — Progress Notes (Addendum)
Subjective: Patient seen and examined ,reported improvement in his back pain down to 6-7/10 cancer/o constipation   Objective: Vital signs in last 24 hours: Temp:  [97.4 F (36.3 C)-98.8 F (37.1 C)] 98.8 F (37.1 C) (11/28 0555) Pulse Rate:  [62-84] 79  (11/28 0555) Resp:  [14-17] 17  (11/28 0555) BP: (125-156)/(70-93) 129/76 mmHg (11/28 0555) SpO2:  [95 %-100 %] 95 % (11/28 0555) Weight change:  Last BM Date: 06/14/11  Intake/Output from previous day: 11/27 0701 - 11/28 0700 In: 490 [P.O.:240; I.V.:250] Out: 700 [Urine:700] Total I/O In: 360 [P.O.:360] Out: -    Physical Exam: General: Alert, awake, oriented x3, in no acute distress. Heart: Regular rate and rhythm, without murmurs, rubs, gallops. Lungs: Clear to auscultation bilaterally. Abdomen: Soft, nontender, distended, positive bowel sounds. Extremities: No clubbing cyanosis or edema with positive pedal pulses. Neuro: Grossly intact, nonfocal.    Lab Results: Results for orders placed during the hospital encounter of 06/14/11 (from the past 24 hour(s))  GLUCOSE, CAPILLARY     Status: Abnormal   Collection Time   06/16/11  4:50 PM      Component Value Range   Glucose-Capillary 114 (*) 70 - 99 (mg/dL)  GLUCOSE, CAPILLARY     Status: Abnormal   Collection Time   06/16/11  9:53 PM      Component Value Range   Glucose-Capillary 178 (*) 70 - 99 (mg/dL)  GLUCOSE, CAPILLARY     Status: Abnormal   Collection Time   06/17/11  7:29 AM      Component Value Range   Glucose-Capillary 139 (*) 70 - 99 (mg/dL)    Studies/Results:   Medications:    . ceFAZolin (ANCEF) IV  1 g Intravenous Once  . ciprofloxacin  500 mg Oral BID  . fentaNYL      . imipramine  100 mg Oral QHS  . insulin aspart  0-15 Units Subcutaneous TID WC  . insulin aspart  0-5 Units Subcutaneous QHS  . midazolam        sodium chloride, alum & mag hydroxide-simeth, cyclobenzaprine, fentaNYL, gadobenate dimeglumine, HYDROcodone-acetaminophen,  midazolam, morphine, ondansetron (ZOFRAN) IV, ondansetron, sodium chloride     . sodium chloride 50 mL/hr (06/16/11 1400)    Assessment/Plan:   1. T12/L1 compression fracture with intractable back pain:  S/P Kyphoplasty Continue pain control  Physical therapy eval  2.Cirrhosis: etiology unclear, hepatitis serology negative , spoke to Dr Loreta Ave who recommended to check labs including ANA,AMA,ASMAB,iron,ferritin,ceruloplasmin ,alpha 1 antitrypsin(all ordered) and she will call him for an appointment as outpatient. 3. 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 Diabetes mellitus: check  A1C ,continue SSI  5.HTN (hypertension) ;Controlled  6. Ecoli UTI: Ciprofloxacin for 2 more days, FU with Urology  7. DVt prophylaxis: SCDs Constipation:will order for laxatives Disposition: [place]  home tomorrow      LOS: 3 days   Rita Prom 06/17/2011, 10:34 AM

## 2011-06-18 ENCOUNTER — Inpatient Hospital Stay (HOSPITAL_COMMUNITY): Payer: Worker's Compensation

## 2011-06-18 LAB — CERULOPLASMIN: Ceruloplasmin: 33 mg/dL (ref 20–60)

## 2011-06-18 LAB — ALPHA-1-ANTITRYPSIN: A-1 Antitrypsin, Ser: 157 mg/dL (ref 90–200)

## 2011-06-18 LAB — ANA: Anti Nuclear Antibody(ANA): NEGATIVE

## 2011-06-18 MED ORDER — MORPHINE SULFATE 2 MG/ML IJ SOLN
INTRAMUSCULAR | Status: AC
  Administered 2011-06-18: 1 mg via INTRAVENOUS
  Filled 2011-06-18: qty 1

## 2011-06-18 NOTE — Progress Notes (Signed)
UR CHART REVIEWED 

## 2011-06-18 NOTE — Progress Notes (Signed)
Pt seen for OT evaluation at 1110 today.  Evaluation documented in Progress Notes.  During session pt presented very lethargic with slurred speech.  Pt also presented weaker in RUE than LUE.  Pt also very weak in RLE.  Pt with decreased coordination with FNF and RAM when tested.  Pt reports the RUE feels "dull."  When tested he has decreased sensation to light touch and pain in the RUE.  Pt was confused at time during session with decreased attention span and slow processing.  Pt's BP was 155/76, O2 sats 99%, and HR 68 at the time of this event.  Nursing was notified.  MD was called.   Tory Emerald, [place]   [phone]

## 2011-06-18 NOTE — Progress Notes (Addendum)
Occupational Therapy Evaluation Patient Details Name: Garrett Turner MRN: [id number] DOB: [birth year] Today's Date: 06/18/2011 EV2  [id number] Problem List:  Patient Active Problem List  Diagnoses  . Intractable pain  . Fall  . Diabetes mellitus  . HTN (hypertension)  . Hyperlipidemia  . Cirrhosis  . UTI (lower urinary tract infection)    Past Medical History:  Past Medical History  Diagnosis Date  . Diabetes mellitus   . Hypertension   . High cholesterol    Past Surgical History:  Past Surgical History  Procedure Date  . Tonsillectomy     OT Assessment/Plan/Recommendation OT Assessment Clinical Impression Statement: Pt s/p kyphoplasy for recent fall at work with compliations including R side weakness, R side decreased sensation, cognitive changes and safety concerns.  Pt would benefit from cont. OT to increase independence with all adls and adl transfers. OT Recommendation/Assessment: Patient will need skilled OT in the acute care venue OT Problem List: Decreased strength;Decreased activity tolerance;Impaired balance (sitting and/or standing);Decreased coordination;Decreased cognition;Decreased safety awareness;Decreased knowledge of use of DME or AE;Decreased knowledge of precautions;Impaired UE functional use;Pain Barriers to Discharge: Other (comment) (Pt cares for his wife.  Do not feel pt safe to go home.) OT Therapy Diagnosis : Cognitive deficits;Generalized weakness;Other (comment) (new changes with RUE function.) OT Plan OT Frequency: Min 2X/week OT Treatment/Interventions: Self-care/ADL training;Therapeutic exercise;Therapeutic activities;Cognitive remediation/compensation;DME and/or AE instruction OT Recommendation Recommendations for Other Services: Rehab consult;Other (comment) (Contacted MD about questionable code stroke.) Follow Up Recommendations: Inpatient Rehab Equipment Recommended: Defer to next venue Individuals Consulted Consulted and Agree with  Results and Recommendations: Patient OT Goals Acute Rehab OT Goals OT Goal Formulation: With patient Time For Goal Achievement: 2 weeks ADL Goals Pt Will Perform Eating: Independently;Unsupported ADL Goal: Eating - Progress: Progressing toward goals Pt Will Perform Grooming: with supervision;Standing at sink;Other (comment) (min guard for balance when standing) ADL Goal: Grooming - Progress: Progressing toward goals Pt Will Perform Lower Body Bathing: with supervision;Sitting at sink ADL Goal: Lower Body Bathing - Progress: Progressing toward goals Pt Will Perform Lower Body Dressing: with supervision;Sit to stand from chair;Unsupported ADL Goal: Lower Body Dressing - Progress: Progressing toward goals Additional ADL Goal #1: Pt will complete toileting (all aspects) with S for safety. ADL Goal: Additional Goal #1 - Progress: Progressing toward goals Arm Goals Additional Arm Goal #1: Pt will use RUE funtionally for 3 grooming tasks without assist. Arm Goal: Additional Goal #1 - Progress: Progressing toward goals  OT Evaluation Precautions/Restrictions  Precautions Precautions: Fall Precaution Comments: R knee buckles and inverts making pt a fall risk.  Pt also confused and lethargic which is a change in mental status for this patient. Required Braces or Orthoses: No Restrictions Weight Bearing Restrictions: No Prior Functioning Home Living Lives With: Spouse Type of Home: House Home Layout: One level Home Access: Stairs to enter Entrance Stairs-Rails: Right Entrance Stairs-Number of Steps: 2 Bathroom Shower/Tub: Health visitor: Handicapped height Bathroom Accessibility: Yes How Accessible: Accessible via walker Home Adaptive Equipment: Walker - rolling;Bedside commode/3-in-1;Grab bars in shower;Hand-held shower hose;Raised toilet seat with rails;Shower chair with back Prior Function Level of Independence: Independent with basic ADLs;Independent with homemaking  with ambulation;Independent with gait;Independent with transfers Able to Take Stairs?: Yes Driving: Yes Vocation: Full time employment Vocation Requirements: works at [employer] doing [employer] and helps with breakfast.  Very active on feet a lot. Comments: Pt cares for wife who is disabled. ADL ADL Eating/Feeding: Simulated;Minimal assistance Eating/Feeding Details (indicate cue type and reason): decreased attn.  span.  Pt somewhat clumsy with his R hand when attempting to eat.  Where Assessed - Eating/Feeding: Edge of bed Grooming: Performed;Brushing hair;Minimal assistance Grooming Details (indicate cue type and reason): Pt stopped combing hair with comb still on his head.  Pt with decreaed attn.  Pt held comb in apraxic fasion for being R handed. Where Assessed - Grooming: Sitting, chair;Supported Location manager Bathing: Simulated;Minimal assistance Where Assessed - Upper Body Bathing: Sitting, chair Lower Body Bathing: Simulated;Moderate assistance Lower Body Bathing Details (indicate cue type and reason): again, pt very lethargic with decreased attn. span.  Cues for pt to bring legs up to him to bathe LEs to maintain proper body mechanics in his back. Where Assessed - Lower Body Bathing: Sitting, bed;Unsupported Upper Body Dressing: Performed;Set up Where Assessed - Upper Body Dressing: Sitting, bed Lower Body Dressing: Performed;Moderate assistance Lower Body Dressing Details (indicate cue type and reason): Pt needed cues to bring feet to him to donn socks.  Cues also needed to tend to task.  Pt very lethargic. Where Assessed - Lower Body Dressing: Sit to stand from chair Toilet Transfer: Performed;Moderate assistance Toilet Transfer Details (indicate cue type and reason): R leg buckles when pt puts weight on leg. Toilet Transfer Method: Surveyor, minerals: Programme researcher, broadcasting/film/video Manipulation: Moderate assistance Toileting - Clothing Manipulation Details  (indicate cue type and reason): balance problems.  Pt unable to stand without walker support.   Where Assessed - Toileting Clothing Manipulation: Standing Toileting - Hygiene: Simulated;Minimal assistance Toileting - Hygiene Details (indicate cue type and reason): assist for lethargy to continue the task. Where Assessed - Toileting Hygiene: Sit on 3-in-1 or toilet Tub/Shower Transfer: Not assessed Equipment Used: Rolling walker ADL Comments: Pt needing significant assist more b/c of lethargy, confusion and weakness/incoordination in the R side Vision/Perception  Vision - History Baseline Vision: No visual deficits Visual History: Other (comment) (wears glasses) Vision - Assessment Eye Alignment: Within Functional Limits Vision Assessment: Vision impaired - to be further tested in functional context Additional Comments: Pt kept eyes closed through much of session.  Would open to command and stare at times. Perception Perception: Not tested Praxis Praxis: Not tested Cognition Cognition Arousal/Alertness: Lethargic Overall Cognitive Status: Impaired Attention: Impaired Current Attention Level: Focused Orientation Level: Oriented to person;Oriented to situation;Oriented to time Awareness of Deficits: Decreased awareness of deficits Awareness of Deficits - Other Comments: not aware of weakness in R side or coordination deficits in R side. Problem Solving: Requires assistance for problem solving Executive Functioning: Pt too lethargic and confused to evaluate.  Pt stating,"I come from my mother..that is what I come from."  THis was very out of context. Sensation/Coordination Sensation Light Touch: Impaired by gross assessment Stereognosis: Not tested Hot/Cold: Not tested Proprioception: Not tested Additional Comments: Pt states his RUE feels "duller" than his LUE.  When pinched pt feels pain worse in the LUE than the RUE. Coordination Gross Motor Movements are Fluid and Coordinated:  No Fine Motor Movements are Fluid and Coordinated: No Coordination and Movement Description: Pt with slow RAM on the R and pt overshoots and is slow with FNF on the Right. Finger Nose Finger Test: slow on R and inaccurate. Extremity Assessment RUE Assessment RUE Assessment: Exceptions to [facility] RUE Strength RUE Overall Strength: Deficits RUE Overall Strength Comments: Pt with 3+/5 strength throughout in the RUE.  THis is pts dominant arm and only has previous hx of a knuckle problem.   LUE Assessment LUE Assessment: Within Functional Limits  Mobility  Bed Mobility Bed Mobility: Yes Supine to Sit: HOB elevated (Comment degrees);3: Mod assist Supine to Sit Details (indicate cue type and reason): VC for proper body mechanics. Transfers Transfers: Yes Sit to Stand: 3: Mod assist;From bed;With upper extremity assist Sit to Stand Details (indicate cue type and reason): Pt needed assist for decreaed balance and weakness in R leg Stand to Sit: 4: Min assist;To chair/3-in-1;With armrests Stand to Sit Details: VCs to reach back for safety. Exercises   End of Session OT - End of Session Activity Tolerance: Patient limited by fatigue;Treatment limited secondary to medical complications (Comment);Other (comment) (Treatment stopped as it was noted pt had R side weakness,) Patient left: in chair;with call bell in reach;Other (comment) (nurse notified of medical change. MD contacted.) Nurse Communication: Mobility status for transfers;Other (comment) (medical changes were told to the nurse.) General Behavior During Session: Lethargic Cognition: Impaired Cognitive Impairment: Pt lethargic today, slow to process, confused at times and not at baseline.   Hope Budds  [phone] 06/18/2011, 1:05 PM

## 2011-06-18 NOTE — Consult Note (Signed)
Physical Medicine and Rehabilitation Consult Reason for Consult:kyphoplasty Referring Phsyician: internal medicine Garrett Turner is an 60 y.o. male.   HPI: 60 year old male admitted November 25 after a recent fall with increasing low back pain. He was reported x-rays of the lumbar fracture he was discharged on Vicodin from the emergency department with conservative care. Due to increasing pain patient returned back to the hospital which he had a fall on his way to the hospital with increasing low back pain. X-rays and imaging revealed a 50% T12 compression fracture and a mild lumbar L1 inferior endplate compression. CT scan of the head was also completed showing no acute changes. Underwent thoracic T12 kyphoplasty without complications on November 27 per Dr. Deanne Coffer. Hospital course Escherichia coli urinary tract infection placed on Cipro. Pain management ongoing with the use of Norco. Latest physical therapy notes patient is moderate assistance to ambulate with a rolling walker 20 feet. Occupational therapy evaluation is pending. Physical medicine and rehabilitation was consulted to consider inpatient rehabilitation services.  Review of Systems  Constitutional: Positive for malaise/fatigue.  Eyes: Negative for double vision.  Respiratory: Negative for cough and shortness of breath.   Cardiovascular: Negative for chest pain.  Gastrointestinal: Positive for constipation. Negative for nausea.  Genitourinary: Negative for urgency.  Musculoskeletal: Positive for back pain and falls.  Neurological: Positive for dizziness and focal weakness. Negative for headaches.       R side onset yest pm  Psychiatric/Behavioral: Negative for depression.   Past Medical History  Diagnosis Date  . Diabetes mellitus   . Hypertension   . High cholesterol    Past Surgical History  Procedure Date  . Tonsillectomy    Family History  Problem Relation Age of Onset  . Coronary artery disease    . Hypertension     . Heart failure Father   . Heart failure Mother   . Stroke Paternal Grandmother   . Hypertension Mother   . Hypertension Father    Social History:  reports that he has been smoking Cigarettes.  He does not have any smokeless tobacco history on file. He reports that he drinks about 1.1 ounces of alcohol per week. He reports that he does not use illicit drugs. Allergies: No Known Allergies Medications Prior to Admission  Medication Dose Route Frequency Provider Last Rate Last Dose  . 0.9 %  sodium chloride infusion   Intravenous STAT Raeford Razor, MD 50 mL/hr at 06/14/11 2139    . 0.9 %  sodium chloride infusion   Intravenous Continuous PRN Durwin Glaze III 50 mL/hr at 06/16/11 1400 50 mL/hr at 06/16/11 1400  . alum & mag hydroxide-simeth (MAALOX/MYLANTA) 200-200-20 MG/5ML suspension 30 mL  30 mL Oral Q6H PRN Debby Crosley, MD      . ceFAZolin (ANCEF) IVPB 1 g/50 mL premix  1 g Intravenous Once Anselm Pancoast, PA   1 g at 06/15/11 2357  . ceFAZolin (ANCEF) IVPB 1 g/50 mL premix  1 g Intravenous Once Dayne Oley Balm III   1 g at 06/16/11 1425  . ciprofloxacin (CIPRO) tablet 500 mg  500 mg Oral BID Preetha Joseph   500 mg at 06/17/11 2020  . cyclobenzaprine (FLEXERIL) tablet 5 mg  5 mg Oral Q12H PRN Debby Crosley, MD      . fentaNYL (SUBLIMAZE) 0.05 MG/ML injection        50 mcg at 06/16/11 1425  . fentaNYL (SUBLIMAZE) injection   Intravenous PRN Dayne Oley Balm III   50  mcg at 06/16/11 1438  . gadobenate dimeglumine (MULTIHANCE) injection 20 mL  20 mL Intravenous Once PRN Medication Radiologist   18 mL at 06/16/11 1301  . HYDROcodone-acetaminophen (NORCO) 5-325 MG per tablet 1-2 tablet  1-2 tablet Oral Q4H PRN Gery Pray, MD   2 tablet at 06/17/11 1810  . HYDROmorphone (DILAUDID) injection 1 mg  1 mg Intravenous Q4H PRN Gery Pray, MD   1 mg at 06/15/11 0243  . imipramine (TOFRANIL) tablet 100 mg  100 mg Oral QHS Preetha Joseph   100 mg at 06/17/11 2207  . insulin  aspart (novoLOG) injection 0-15 Units  0-15 Units Subcutaneous TID WC Gery Pray, MD   2 Units at 06/17/11 1755  . insulin aspart (novoLOG) injection 0-5 Units  0-5 Units Subcutaneous QHS Gery Pray, MD   2 Units at 06/15/11 2211  . midazolam (VERSED) 2 MG/2ML injection        1 mg at 06/16/11 1425  . midazolam (VERSED) 5 MG/5ML injection   Intravenous PRN Dayne Oley Balm III   1 mg at 06/16/11 1438  . morphine 2 MG/ML injection 1 mg  1 mg Intravenous Q4H PRN Gery Pray, MD   1 mg at 06/17/11 2208  . morphine 4 MG/ML injection 4 mg  4 mg Intravenous Once Raeford Razor, MD   4 mg at 06/14/11 2138  . ondansetron (ZOFRAN) injection 4 mg  4 mg Intravenous Q8H PRN Raeford Razor, MD   4 mg at 06/14/11 2138  . ondansetron (ZOFRAN) tablet 4 mg  4 mg Oral Q6H PRN Debby Crosley, MD       Or  . ondansetron (ZOFRAN) injection 4 mg  4 mg Intravenous Q6H PRN Debby Crosley, MD      . polyethylene glycol (MIRALAX / GLYCOLAX) packet 17 g  17 g Oral Daily Sosan Abdullah      . senna-docusate (Senokot-S) tablet 2 tablet  2 tablet Oral QHS PRN Sosan Abdullah      . sodium chloride 0.9 % injection 3 mL  3 mL Intravenous PRN Debby Crosley, MD      . DISCONTD: acetaminophen (TYLENOL) suppository 650 mg  650 mg Rectal Q6H PRN Debby Crosley, MD      . DISCONTD: acetaminophen (TYLENOL) tablet 650 mg  650 mg Oral Q6H PRN Debby Crosley, MD      . DISCONTD: aspirin EC tablet 81 mg  81 mg Oral Daily Debby Crosley, MD      . DISCONTD: diazepam (VALIUM) tablet 5 mg  5 mg Oral Q12H PRN Debby Crosley, MD      . DISCONTD: HYDROmorphone (DILAUDID) injection 1 mg  1 mg Intravenous Q4H PRN Raeford Razor, MD      . DISCONTD: imipramine (TOFRANIL) tablet 200 mg  200 mg Oral QHS Debby Crosley, MD       Medications Prior to Admission  Medication Sig Dispense Refill  . aspirin EC 81 MG tablet Take 81 mg by mouth daily.       . ciprofloxacin (CIPRO) 500 MG tablet Take 500 mg by mouth 2 (two) times daily. 14 day course of  therapy; not completed.      . diazepam (VALIUM) 5 MG tablet Take 1 tablet (5 mg total) by mouth every 8 (eight) hours as needed for anxiety.  15 tablet  0  . HYDROcodone-acetaminophen (VICODIN) 5-500 MG per tablet Take 1 tablet by mouth every 4 (four) hours as needed for pain.  20 tablet  0  . imipramine (TOFRANIL) 50  MG tablet Take 200 mg by mouth at bedtime.        . naproxen sodium (ANAPROX) 220 MG tablet Take 440 mg by mouth 2 (two) times daily with a meal. For back pain      . oxyCODONE-acetaminophen (PERCOCET) 5-325 MG per tablet Take 1 tablet by mouth every 4 (four) hours as needed for pain.  45 tablet  0    Home: Home Living Lives With: Spouse (who is disabled, pt takes care of wife) Type of Home: House Home Layout: One level Home Access: Stairs to enter Entrance Stairs-Rails: Right Entrance Stairs-Number of Steps: 2 Bathroom Shower/Tub: Health visitor: Handicapped height Bathroom Accessibility: Yes How Accessible: Accessible via walker Home Adaptive Equipment: Walker - rolling;Bedside commode/3-in-1;Grab bars in shower;Hand-held shower hose;Raised toilet seat with rails;Shower chair with back  Functional History: Prior Function Level of Independence: Independent with basic ADLs;Independent with homemaking with ambulation;Independent with gait;Independent with transfers Able to Take Stairs?: Yes Driving: Yes Vocation: Full time employment Comments: 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 Functional Status:  Mobility: Bed Mobility Bed Mobility: Yes Supine to Sit: HOB elevated (Comment degrees);3: Mod assist (at 50) Supine to Sit Details (indicate cue type and reason): vc for back protection Transfers Transfers: Yes Sit to Stand: 3: Mod assist;From chair/3-in-1;With upper extremity assist Sit to Stand Details (indicate cue type and reason): vc to push from armrests Stand to Sit: 4: Min assist;To chair/3-in-1;With armrests Stand to Sit Details: VC to  reach back to armrests Ambulation/Gait Ambulation/Gait: Yes Ambulation/Gait Assistance: 3: Mod assist Ambulation/Gait Assistance Details (indicate cue type and reason): PT supported R knee to prevent buckling, noted R knee hyperextension with anlke inversion Ambulation Distance (Feet): 20 Feet (limited due to gait instability) Assistive device: Rolling walker Gait Pattern: Step-to pattern;Decreased hip/knee flexion - right;Right genu recurvatum (R ankle inversion) Gait velocity: slow, unsteady    ADL:    Cognition: Cognition Arousal/Alertness: Awake/alert Orientation Level: Oriented X4 Cognition Arousal/Alertness: Awake/alert Overall Cognitive Status: Appears within functional limits for tasks assessed Orientation Level: Oriented X4  Blood pressure 118/73, pulse 69, temperature 97.5 F (36.4 C), temperature source Oral, resp. rate 18, height 6\' 2"  (1.88 m), weight 94.7 kg (208 lb 12.4 oz), SpO2 100.00%. Physical Exam  Constitutional: He is oriented to person, place, and time. He appears well-developed.  HENT:  Head: Normocephalic.  Neck: Normal range of motion. Neck supple. No thyromegaly present.  Cardiovascular: Normal rate.   Pulmonary/Chest: Effort normal. He has no wheezes.  Abdominal: He exhibits no distension. There is no tenderness.  Musculoskeletal: He exhibits no edema.  Neurological: He is alert and oriented to person, place, and time.  Skin: Skin is warm and dry.  Psychiatric: He has a normal mood and affect.   Motor strength 4/5 on R ide in UE and LE, 5/5  Sensory reduced to lt touch on the R side SLR - Babinski positive on R side  Results for orders placed during the hospital encounter of 06/14/11 (from the past 24 hour(s))  GLUCOSE, CAPILLARY     Status: Abnormal   Collection Time   06/17/11  7:29 AM      Component Value Range   Glucose-Capillary 139 (*) 70 - 99 (mg/dL)  GLUCOSE, CAPILLARY     Status: Abnormal   Collection Time   06/17/11 11:17 AM       Component Value Range   Glucose-Capillary 325 (*) 70 - 99 (mg/dL)  FERRITIN     Status: Normal  Collection Time   06/17/11 11:50 AM      Component Value Range   Ferritin 101  22 - 322 (ng/mL)  CERULOPLASMIN     Status: Normal   Collection Time   06/17/11 11:50 AM      Component Value Range   Ceruloplasmin 33  20 - 60 (mg/dL)  [id number]     Status: Normal   Collection Time   06/17/11 11:50 AM      Component Value Range   A-1 Antitrypsin, Ser 157  90 - 200 (mg/dL)  GLUCOSE, CAPILLARY     Status: Abnormal   Collection Time   06/17/11  4:34 PM      Component Value Range   Glucose-Capillary 126 (*) 70 - 99 (mg/dL)  GLUCOSE, CAPILLARY     Status: Abnormal   Collection Time   06/17/11  9:07 PM      Component Value Range   Glucose-Capillary 173 (*) 70 - 99 (mg/dL)   Mr Lumbar Spine W Wo Contrast  06/16/2011  *RADIOLOGY REPORT*  Clinical Data: Severe low back pain.  Evaluate for probable T12 and L1 compression fractures.  MRI LUMBAR SPINE WITHOUT AND WITH CONTRAST  Technique:  Multiplanar and multiecho pulse sequences of the lumbar spine were obtained without and with intravenous contrast.  Contrast: 18mL MULTIHANCE GADOBENATE DIMEGLUMINE 529 MG/ML IV SOLN  Comparison: Lumbar spine radiographs 06/08/2011 and 06/14/2011. Abdominal CT 06/08/2011.  Findings: There is a mild convex left scoliosis.  Superior endplate compression fracture at T12 is associated with marrow edema in the vertebral body, 2 mm of osseous retropulsion and approximately 50% loss of vertebral body height.  This fracture has not significantly progressed over the last 8 days.  There is a Schmorl's node involving the inferior endplate of L4 on the right.  There is mild adjacent marrow edema and enhancement. This also appears stable compared with the recent CT.  No other fractures are identified.  An inferior L1 endplate Schmorl's node appears chronic and without adjacent marrow edema. There are chronic L5 pars defects  bilaterally without significant anterolisthesis.  The conus medullaris extends to the T12-L1 level and appears normal. There is no abnormal intradural enhancement.  There are no paraspinal abnormalities.  Findings consistent with cirrhosis and portal hypertension are partially imaged and appear unchanged.  L1-L2:  Inferior endplate compression deformity.  No significant disc bulging, spinal stenosis or nerve root encroachment.  L2-L3:  There is disc desiccation with annular disc bulging mildly eccentric to the right.  There is mild narrowing of the lateral recesses.  The foramina are patent.  L3-L4:  Disc height and hydration are maintained.  There is minimal disc bulging with mild facet and ligamentous hypertrophy.  No spinal stenosis or nerve root encroachment results.  L4-L5:  There is mild disc bulging with shallow central and right foraminal disc protrusions.  There is mild narrowing of the lateral recesses and right foramen.  Mild facet hypertrophy is present bilaterally.  L5-S1:  As above, there are bilateral L5 pars defects with mild annular disc bulging.  The foramina are mildly narrowed bilaterally.  There is no sacral nerve root encroachment.  IMPRESSION:  1.  Superior endplate compression deformity at T12 is associated with marrow edema and enhancement, consistent with a subacute fracture.  There is approximately 50% loss of vertebral body height with mild osseous retropulsion. 2.  No other acute fractures are identified.  There is mild marrow edema surrounding a Schmorl's node involving the inferior endplate of L4.  3.  Mild spondylosis for age, most advanced at the L2-L3 and L4-L5 levels.  There is no high-grade spinal stenosis or definite nerve root encroachment. 4.  Bilateral L5 pars defects with mild resulting biforaminal stenosis at L5-S1.  Original Report Authenticated By: Gerrianne Scale, M.D.   Ir Kyphoplasty Or Sacroplasty  06/16/2011  *RADIOLOGY REPORT*   Clinical Data:  Patient with  post-traumatic painful compression fracture deformity of T12.  KYPHOPLASTY AT T12: Technique and findings: The procedure, risks (including but not limited to bleeding, infection, organ damage), benefits, and alternatives were explained to the patient.  Questions regarding the procedure were encouraged and answered.  The patient understands and consents to the procedure. The patient was placed prone on the fluoroscopic table.  The skin overlying the T12 region was then prepped and draped in the usual sterile fashion.  Intravenous Fentanyl and Versed were administered as conscious sedation during continuous cardiorespiratory monitoring by the radiology RN, with a total moderate sedation time of 32 minutes.  The right pedicle at T12 was then infiltrated with 1% lidocaine followed by the advancement of a Kyphon trocar needle, advanced until the tip of the working cannula was inside the posterior one- third at T12. The osteo drill was advanced to  within 5 mm of the anterior aspect of T12. Through the working cannula, a Kyphon inflatable bone tamp 15 x 5 was advanced and positioned with the distal marker 5 mm from the anterior aspect of T12.  Crossing of the midline was seen on the AP projection.  At this time, the balloon was expanded using contrast via a Kyphon inflation syringe device via micro tubing. Inflations were continued until there was apposition with the superior and the inferior end plates. At this time, methylmethacrylate mixture was reconstituted   in the Kyphon bone mixing device system.  This was then loaded into the delivery device. The balloon was deflated and removed followed by the instillation of    methylmethacrylate mixture at T12 with excellent filling in the AP and lateral projections.  No extravasation was noted in the disk spaces or posteriorly into the spinal canal.  No epidural venous contamination was seen. The patient tolerated the procedure well.  There were no acute complications.  The  working cannula and the bone filler were then retrieved and removed. IMPRESSION: 1.  Status post vertebral body augmentation using balloon kyphoplasty at T12 as described without event. Original Report Authenticated By: Osa Craver, M.D.    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 Does the need for close, 24 hr/day medical supervision in concert with the patient's rehab needs make it unreasonable for this patient to be served in a less intensive setting? Potentially 2. Co-Morbidities requiring supervision/potential complications: Pain, DM,HTN 3. Due to bladder management, bowel management, safety, skin/wound care, disease management, medication administration, pain management and patient education, does the patient require 24 hr/day rehab nursing? Yes and Potentially 4. Does the patient require coordinated care of a physician, rehab nurse, PT (1-2 hrs/day, 5 days/week), OT (1-2 hrs/day, 5 days/week) and SLP (.5-1 hrs/day, 5 days/week) to address physical and functional deficits in the context of the above medical diagnosis(es)? Potentially Addressing deficits in the following areas: balance, bathing, bowel/bladder control, dressing, grooming, locomotion, speech, strength, toileting and transferring 5. Can the patient actively participate in an intensive therapy program of at least 3 hrs of therapy per day at least 5 days  per week? Potentially 6. The potential for patient to make measurable gains while on inpatient rehab is good 7. Anticipated functional outcomes upon discharge from inpatients are mod I PT, Mod I OT, Mod ISLP 8. Estimated rehab length of stay to reach the above functional goals is: 10 days 9. Does the patient have adequate social supports to accommodate these discharge functional goals? Potentially 10. Anticipated D/C setting: Home 11. Anticipated post D/C treatments: HH therapy 12. Overall  Rehab/Functional Prognosis: good  RECOMMENDATIONS: This patient's condition is appropriate for continued rehabilitative care in the following setting: Will need to follow pt after he is transferred to [facility] for stroke work up Patient has agreed to participate in recommended program. Yes Note that insurance prior authorization may be required for reimbursement for recommended care.  Comment:   ANGIULLI,DANIEL J. 06/18/2011

## 2011-06-18 NOTE — Progress Notes (Signed)
Subjective: I was  Called by RN 2 11.40  to evaluate the patient for new facial droop and RUE weakness observed at 11.10 AM ,patient was last seen normal by RN at 9:30 AM. Code stroke was activated as per my order ,Patient was immediatly seen and examined ,his speech is slow but not dysarthric ,he does not have any complaints.  Objective: Vital signs in last 24 hours: Temp:  [97.5 F (36.4 C)-97.9 F (36.6 C)] 97.5 F (36.4 C) (11/29 1135) Pulse Rate:  [68-89] 68  (11/29 1135) Resp:  [16-18] 16  (11/29 1135) BP: (110-155)/(72-75) 155/75 mmHg (11/29 1135) SpO2:  [97 %-100 %] 100 % (11/29 1135) Weight change:  Last BM Date: 06/14/11  Intake/Output from previous day: 11/28 0701 - 11/29 0700 In: 1080 [P.O.:1080] Out: 675 [Urine:675] Total I/O In: 120 [P.O.:120] Out: -    Physical Exam: General: Alert, awake, oriented x3, in no acute distress. Heart: Regular rate and rhythm, without murmurs, rubs, gallops. Lungs: Clear to auscultation bilaterally. Abdomen: Soft, nontender, nondistended, positive bowel sounds. Extremities: No clubbing cyanosis or edema with positive pedal pulses. Neuro: slow speech ,flat left nasolabial fold ,right side motor power 4/5 ,weak right hand grip .    Lab Results: Results for orders placed during the hospital encounter of 06/14/11 (from the past 24 hour(s))  GLUCOSE, CAPILLARY     Status: Abnormal   Collection Time   06/17/11  4:34 PM      Component Value Range   Glucose-Capillary 126 (*) 70 - 99 (mg/dL)  GLUCOSE, CAPILLARY     Status: Abnormal   Collection Time   06/17/11  9:07 PM      Component Value Range   Glucose-Capillary 173 (*) 70 - 99 (mg/dL)  GLUCOSE, CAPILLARY     Status: Abnormal   Collection Time   06/18/11  8:00 AM      Component Value Range   Glucose-Capillary 139 (*) 70 - 99 (mg/dL)  GLUCOSE, CAPILLARY     Status: Abnormal   Collection Time   06/18/11 11:38 AM      Component Value Range   Glucose-Capillary 231 (*) 70 - 99  (mg/dL)    Studies/Results: Medications:    . ciprofloxacin  500 mg Oral BID  . imipramine  100 mg Oral QHS  . insulin aspart  0-15 Units Subcutaneous TID WC  . insulin aspart  0-5 Units Subcutaneous QHS  . polyethylene glycol  17 g Oral Daily    alum & mag hydroxide-simeth, cyclobenzaprine, HYDROcodone-acetaminophen, morphine, ondansetron (ZOFRAN) IV, ondansetron, senna-docusate, sodium chloride     Assessment/Plan: New onset left facial drop/Right sided weakness: Code stroke was activated ,will order stat head CT .Discussed with Dr Thad Ranger ,will transfer to [facility] as per recommendation .Patient with platelet count of 55 ,not a candidate for TPA. T12/L1 compression fracture with intractable back pain:  S/P Kyphoplasty  Continue pain control  Physical therapy  Cirrhosis: etiology unclear, hepatitis serology negative , spoke to Dr Loreta Ave who recommended to check labs including ANA,AMA,ASMAB,iron,ferritin,ceruloplasmin ,alpha 1 antitrypsin(all ordered) and she will call him for an appointment as outpatient.  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

## 2011-06-18 NOTE — Progress Notes (Signed)
Pt transferred to [id number] for further eval./monitoring from r side weakness to upper & lower extremities,slight facial droop & groggy with slow speech & thought process. Code stroke done & CT of head done with negative result. NSL to L hand dated 11/ 27/12 maintained. Dressing to lower mid back CDI. Wife aware of transfer. Report give to [facility] staff. Report attempted to be given to receiving RN but unable to take at this time. Will call her back.

## 2011-06-19 ENCOUNTER — Inpatient Hospital Stay (HOSPITAL_COMMUNITY): Payer: Worker's Compensation

## 2011-06-19 ENCOUNTER — Encounter (HOSPITAL_COMMUNITY): Payer: Self-pay | Admitting: Neurology

## 2011-06-19 DIAGNOSIS — I6789 Other cerebrovascular disease: Secondary | ICD-10-CM

## 2011-06-19 LAB — LIPID PANEL
HDL: 34 mg/dL — ABNORMAL LOW (ref >39)
LDL Cholesterol: 164 mg/dL — ABNORMAL HIGH (ref 0–99)
Triglycerides: 97 mg/dL (ref <150)

## 2011-06-19 LAB — GLUCOSE, CAPILLARY
Glucose-Capillary: 223 mg/dL — ABNORMAL HIGH (ref 70–99)
Glucose-Capillary: 148 mg/dL — ABNORMAL HIGH (ref 70–99)
Glucose-Capillary: 218 mg/dL — ABNORMAL HIGH (ref 70–99)

## 2011-06-19 LAB — HEMOGLOBIN A1C: Hgb A1c MFr Bld: 7.9 % — ABNORMAL HIGH (ref <5.7)

## 2011-06-19 MED ORDER — SENNOSIDES-DOCUSATE SODIUM 8.6-50 MG PO TABS: 1.0000 | ORAL_TABLET | Freq: Every evening | ORAL | Status: DC | PRN

## 2011-06-19 MED ORDER — SODIUM CHLORIDE 0.9 % IV SOLN
INTRAVENOUS | Status: DC
  Administered 2011-06-19: 07:00:00 via INTRAVENOUS

## 2011-06-19 MED ORDER — ASPIRIN 81 MG PO CHEW
81.0000 mg | CHEWABLE_TABLET | Freq: Every day | ORAL | Status: DC
  Administered 2011-06-19 – 2011-06-20 (×2): 81 mg via ORAL
  Filled 2011-06-19 (×2): qty 1

## 2011-06-19 NOTE — Progress Notes (Signed)
Physical Therapy Treatment Patient Details Name: Garrett Turner MRN: [id number] DOB: 1951-02-20 Today's Date: 06/19/2011  PT Assessment/Plan  PT - Assessment/Plan Comments on Treatment Session: Paitient is s/p back surgery with symptoms of CVA post op.  Pt. presents with RLE dysfunction, knee buckling, and knee instability with ambulation.  Pt. continues to be at risk for falls.Patient will need 24 hour assistance at D/C.  Wife is disabled and cannot assist pt.  If CIR not an option, pt. needs SNF. PT Plan: Discharge plan remains appropriate;Frequency remains appropriate PT Frequency: Min 5X/week Follow Up Recommendations: Inpatient Rehab Equipment Recommended: Defer to next venue PT Goals  Acute Rehab PT Goals PT Goal: Rolling Supine to Right Side - Progress: Progressing toward goal PT Goal: Rolling Supine to Left Side - Progress: Progressing toward goal PT Goal: Supine/Side to Sit - Progress: Progressing toward goal PT Goal: Sit to Supine/Side - Progress: Progressing toward goal PT Transfer Goal: Sit to Stand/Stand to Sit - Progress: Progressing toward goal PT Goal: Ambulate - Progress: Progressing toward goal PT Goal: Up/Down Stairs - Progress: Other (comment)  PT Treatment Precautions/Restrictions  Precautions Precautions: Fall;Back Precaution Booklet Issued: Yes (comment) Precaution Comments: R knee buckling noted with pt. continuing to be a fall risk.  Pt. awake and alert.  Had trouble with sustained attention. Required Braces or Orthoses: No Restrictions Weight Bearing Restrictions: No Mobility (including Balance) Bed Mobility Bed Mobility: Yes Rolling Left: 5: Supervision Rolling Left Details (indicate cue type and reason): Cues for back precautions Left Sidelying to Sit: 5: Supervision;HOB flat Left Sidelying to Sit Details (indicate cue type and reason): Cues for back precautions, pt. needed incr. time. Supine to Sit: Not tested (comment) Transfers Sit to Stand:  3: Mod assist;From elevated surface;With upper extremity assist;From bed Sit to Stand Details (indicate cue type and reason): Pt. continues to need assist secondary to decreased balance and weakness in R leg.   Stand to Sit: 4: Min assist;With upper extremity assist;With armrests;To chair/3-in-1 Stand to Sit Details: Verbal cues for hand placement for safety. Ambulation/Gait Ambulation/Gait Assistance: 3: Mod assist Ambulation/Gait Assistance Details (indicate cue type and reason): Pt. continues to have difficulty with R knee buckling needing assist to provide support for R knee.  Pt. having trouble also with placement of R foot as he seemed to move it and then tried to feel the floor (question if proprioceptive issue). Ambulation Distance (Feet): 8 Feet Assistive device: Rolling walker Gait Pattern: Step-to pattern;Decreased step length - right;Decreased stance time - right;Decreased hip/knee flexion - right;Decreased weight shift to right;Right foot flat;Shuffle;Antalgic;Right genu recurvatum Gait velocity: Slow and unsteady Stairs: No Wheelchair Mobility Wheelchair Mobility: No  Posture/Postural Control Posture/Postural Control: Postural limitations Postural Limitations: Flexes posture at times. Balance Balance Assessed: No Exercise    End of Session PT - End of Session Equipment Utilized During Treatment: Gait belt Activity Tolerance: Patient limited by fatigue Patient left: in chair;with call bell in reach Nurse Communication: Mobility status for ambulation General Behavior During Session: [facility] for tasks performed Cognition: Impaired Cognitive Impairment: Continues with slow processing.  Sustained attention only.  INGOLD,Lowry Bala 06/19/2011, 1:50 PM [facility] [phone] (pager)

## 2011-06-19 NOTE — Progress Notes (Signed)
Stroke Team Progress Note  SUBJECTIVE The patient is a 60 year old right-handed white male with a history diabetes, hypertension, and dyslipidemia. Patient apparently fell 2 weeks prior to admission and hurt his back, and got dizzy and fell again on the day of admission. The patient required kyphoplasty at the T12 level without complications on 06/16/2011. Patient was recovering, and was seen by rehabilitation. The patient indicates that on 06/17/2011, while up with the physical therapist, the patient was noted to have right sided weakness involving the leg, and possibly the arm. The patient indicates that on 06/18/2011, he recalls feeling fairly normal around 9:30 AM. The patient had taken medications for pain, and was dozing off and on. The patient was awakened somewhere around 11:10 AM, and was noted to be slurring his speech. The patient indicates that he was coming in and out of a sleeping state. The patient recalls dropping the telephone. A code stroke was called, and a CT scan of the brain was done, and showed no acute changes. The patient is found to have some mild drift on the right side, with some decreased pinprick sensation on the right side. A code stroke was called, but patient was not felt to be a candidate for TPA mainly secondary to a persistent pancytopenia that included a platelet level of 55. The patient was on low-dose aspirin prior to coming in the hospital.   OBJECTIVE Most recent Vital Signs: Temp: 98 F (36.7 C) (11/30 0700) Temp src: Oral (11/30 0700) BP: 110/72 mmHg (11/30 0700) Pulse Rate: 72  (11/30 0700) Respiratory Rate: 20 O2 Saturdation: 93%  CBG (last 3)   Basename 06/19/11 0709 06/19/11 0117 06/18/11 2245  GLUCAP 148* 223* 240*   Intake/Output from previous day: 11/29 0701 - 11/30 0700 In: 120 [P.O.:120] Out: 500 [Urine:500]  IV Fluid Intake:     . sodium chloride 75 mL/hr at 06/19/11 0704   Diet:  Carb Control thin liquids  Activity:  Up with  assistance  DVT Prophylaxis:  SCDs   Studies: CBC    Component Value Date/Time   WBC 3.5* 06/16/2011 0437   RBC 3.69* 06/16/2011 0437   HGB 11.1* 06/16/2011 0437   HCT 32.4* 06/16/2011 0437   PLT 55* 06/16/2011 0437   MCV 87.8 06/16/2011 0437   MCH 30.1 06/16/2011 0437   MCHC 34.3 06/16/2011 0437   RDW 13.6 06/16/2011 0437   LYMPHSABS 1.5 04/18/2008 2125   MONOABS 0.5 04/18/2008 2125   EOSABS 0.2 04/18/2008 2125   BASOSABS 0.1 04/18/2008 2125   CMP    Component Value Date/Time   NA 137 06/15/2011 0452   K 3.8 06/15/2011 0452   CL 105 06/15/2011 0452   CO2 25 06/15/2011 0452   GLUCOSE 142* 06/15/2011 0452   BUN 14 06/15/2011 0452   CREATININE 0.80 06/15/2011 0452   CALCIUM 9.1 06/15/2011 0452   PROT 6.5 06/15/2011 0452   ALBUMIN 2.8* 06/15/2011 0452   AST 30 06/15/2011 0452   ALT 16 06/15/2011 0452   ALKPHOS 124* 06/15/2011 0452   BILITOT 0.6 06/15/2011 0452   GFRNONAA >90 06/15/2011 0452   GFRAA >90 06/15/2011 0452   COAGS Lab Results  Component Value Date   INR 1.21 06/16/2011   Lipid Panel    Component Value Date/Time   CHOL 217* 06/19/2011 0706   TRIG 97 06/19/2011 0706   HDL 34* 06/19/2011 0706   CHOLHDL 6.4 06/19/2011 0706   VLDL 19 06/19/2011 0706   LDLCALC 164* 06/19/2011 0706   HgbA1C  No results found for this basename: HGBA1C   Urine Drug Screen    Component Value Date/Time   LABOPIA NONE DETECTED 06/14/2011 1553   COCAINSCRNUR NONE DETECTED 06/14/2011 1553   LABBENZ POSITIVE* 06/14/2011 1553   AMPHETMU NONE DETECTED 06/14/2011 1553   THCU NONE DETECTED 06/14/2011 1553   LABBARB NONE DETECTED 06/14/2011 1553    Alcohol Level    Component Value Date/Time   ETH <11 06/14/2011 1403     Ct Head Wo Contrast  06/18/2011  *RADIOLOGY REPORT*  Clinical Data: Code stroke.  Right-sided weakness.  CT HEAD WITHOUT CONTRAST  Technique:  Contiguous axial images were obtained from the base of the skull through the vertex without contrast.   Comparison: CT 06/14/2011  Findings: Ventricles are normal.  Negative for hemorrhage or mass. No acute infarct.  No change from prior study.  Mild mucosal edema right maxillary sinus.  IMPRESSION: No acute intracranial abnormality.  Critical Value/emergent results were called by telephone at the time of interpretation on 06/18/2011  at 1230 hours  to  Dr. Cleotis Lema, who verbally acknowledged these results.  Original Report Authenticated By: Camelia Phenes, M.D.   MRI of the brain  Ordered   MRA of the brain  ordered  2D Echocardiogram  Done, result pending  Carotid Doppler  ordered  CXR  Not ordered  EKG  normal sinus rhythm, borderline T abnormalities.   Physical Exam   Heart rate regular. Breath sounds clear.hearing normal. No edema. Neurological Exam. Patient alert and oriented x 3. Speech clear. No aphasia. No dysarthria. Extraoccular movements intact. Visual fields full. Face symmetric. Tongue midline. Moves all extremities x 4. Strength normal on left, right arm and leg drifts. Coordination normal. Sensation intact on the Left, decreased on the right.gait deferred.   ASSESSMENT [name] is a 60 y.o. male with a left brain infarct, stroke workup underway. ? Etiology. On aspirin 81 mg orally every day for secondary stroke prevention.  Pancytopenia  Stroke risk factors:  diabetes mellitus, hyperlipidemia and hypertension  Hospital day # 5  TREATMENT/PLAN Continue aspirin 81 mg orally every day for secondary stroke prevention. Complete stroke workup - MRA, MRI, 2D, Carotid dopplers, HBA1c.Stroke team will follow.  Joaquin Music, ANP-BC, GNP-BC [facility] Pager: [phone] 06/19/2011 11:05 AM  Dr. Delia Heady, Stroke Center Medical Director, has personally reviewed chart, pertinent data, examined the patient and developed the plan of care.

## 2011-06-19 NOTE — Progress Notes (Signed)
[facility] hospitalist transfer note for Dr. Blake Divine. Chief complaint. Code stroke. History of present illness This elderly male admitted to hospital following a series of falls. 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 He was noted today to have acute speech changes and code stroke was called. Because of chronic low platelets the patient was not thought to be a candidate for TPA. Neurology was notified of patient's changes including speech and right-sided weakness and requested the patient be sent to [facility]:. Patient is now status post transfer. Vital signs. Temperature 98.1, pulse 89, respiration 20,, blood pressure /77. Gen. appearance. Well-developed male in no distress. Alert and oriented. Cardiac. Rate and rhythm regular. No jugular venous distention or significant edema. Lungs. Breath sounds are clear and equal. Neurologic. Speech is clear. No cranial nerve deficit. He does have a right-sided weakness with poor right side grip strength. Impression/plan. #1. Code stroke. Again patient is not a candidate for TPN given thrombocytopenia. Patient appears to be alert, oriented, and stable. I have requested the telemetry monitoring. Also vital signs and neuro checks every 4 hours. Nursing will contact neurology to inform them of patient's arrival.

## 2011-06-19 NOTE — Progress Notes (Signed)
Subjective: Feels better, no new complaints.  Objective: Vital signs in last 24 hours: Filed Vitals:   06/19/11 0700 06/19/11 1030 06/19/11 1100 06/19/11 1400  BP: 110/72  115/83 131/82  Pulse: 72 72 79 66  Temp: 98 F (36.7 C)  97.9 F (36.6 C) 98.2 F (36.8 C)  TempSrc: Oral  Oral Oral  Resp: 20  20 20   Height:      Weight:      SpO2: 93%  97% 97%   Weight change:   Intake/Output Summary (Last 24 hours) at 06/19/11 1551 Last data filed at 06/19/11 0700  Gross per 24 hour  Intake      0 ml  Output    500 ml  Net   -500 ml   Physical Exam:   General Appearance:    Alert, cooperative, no distress, appears stated age  Lungs:     Clear to auscultation bilaterally, respirations unlabored   Heart:    Regular rate and rhythm, S1 and S2 normal,  Abdomen:     Soft, non-tender, bowel sounds active all four quadrants,      Extremities:   Extremities normal, atraumatic, no cyanosis or edema  Pulses:   2+ and symmetric all extremities  Skin:   Skin color, texture, turgor normal, no rashes or lesions  Neurologic:    No new deficits.      Lab Results: Results for orders placed during the hospital encounter of 06/14/11 (from the past 24 hour(s))  GLUCOSE, CAPILLARY     Status: Abnormal   Collection Time   06/18/11 10:45 PM      Component Value Range   Glucose-Capillary 240 (*) 70 - 99 (mg/dL)   Comment 1 Documented in Chart     Comment 2 Notify RN    GLUCOSE, CAPILLARY     Status: Abnormal   Collection Time   06/19/11  1:17 AM      Component Value Range   Glucose-Capillary 223 (*) 70 - 99 (mg/dL)  HEMOGLOBIN W0J     Status: Abnormal   Collection Time   06/19/11  7:06 AM      Component Value Range   Hemoglobin A1C 7.9 (*) <5.7 (%)   Mean Plasma Glucose 180 (*) <117 (mg/dL)  LIPID PANEL     Status: Abnormal   Collection Time   06/19/11  7:06 AM      Component Value Range   Cholesterol 217 (*) 0 - 200 (mg/dL)   Triglycerides 97  <811 (mg/dL)   HDL 34 (*) >91 (mg/dL)     Total CHOL/HDL Ratio 6.4     VLDL 19  0 - 40 (mg/dL)   LDL Cholesterol 478 (*) 0 - 99 (mg/dL)  GLUCOSE, CAPILLARY     Status: Abnormal   Collection Time   06/19/11  7:09 AM      Component Value Range   Glucose-Capillary 148 (*) 70 - 99 (mg/dL)  GLUCOSE, CAPILLARY     Status: Abnormal   Collection Time   06/19/11 11:55 AM      Component Value Range   Glucose-Capillary 218 (*) 70 - 99 (mg/dL)   Comment 1 Notify RN      Micro Results: No results found for this or any previous visit (from the past 240 hour(s)). Studies/Results: Ct Head Wo Contrast  06/18/2011  *RADIOLOGY REPORT*  Clinical Data: Code stroke.  Right-sided weakness.  CT HEAD WITHOUT CONTRAST  Technique:  Contiguous axial images were obtained from the base of the  skull through the vertex without contrast.  Comparison: CT 06/14/2011  Findings: Ventricles are normal.  Negative for hemorrhage or mass. No acute infarct.  No change from prior study.  Mild mucosal edema right maxillary sinus.  IMPRESSION: No acute intracranial abnormality.  Critical Value/emergent results were called by telephone at the time of interpretation on 06/18/2011  at 1230 hours  to  Dr. Cleotis Lema, who verbally acknowledged these results.  Original Report Authenticated By: Camelia Phenes, M.D.   [name] Contrast  06/19/2011  *RADIOLOGY REPORT*  Clinical Data:  61 year old male with right side weakness, slurred speech, falls.  Possible stroke.  Comparison: Head CTs without contrast 06/18/2011 and earlier.  MRI HEAD WITHOUT CONTRAST  Technique: Multiplanar, multiecho pulse sequences of the brain and surrounding structures were obtained according to standard protocol without intravenous contrast.  Findings: No restricted diffusion to suggest acute infarction.  No midline shift, mass effect, evidence of mass lesion, ventriculomegaly, extra-axial collection or acute intracranial hemorrhage.  Cervicomedullary junction and pituitary are within normal limits.   Major intracranial vascular flow voids are preserved.  Mild for age scattered cerebral and pontine white matter T2 and FLAIR hyperintensity.  Deep gray matter nuclei within normal limits.  Cerebellum within normal limits.  Negative for age visualized cervical spine.  Visualized bone marrow signal is within normal limits.  Visualized orbit soft tissues are within normal limits.  Minor paranasal sinus mucosal thickening.  Mastoids are clear. Visualized scalp soft tissues are within normal limits.  IMPRESSION: 1. No acute intracranial abnormality. 2.  Mild nonspecific white matter signal changes. 3.  MRA findings are below.  MRA HEAD WITHOUT CONTRAST  Technique: Angiographic images of the Circle of Willis were obtained using MRA technique without  intravenous contrast.  Findings: Antegrade flow in the posterior circulation codominant distal vertebral arteries.  Tortuous vertebrobasilar junction.  No basilar stenosis.  Normal left PICA.  Dominant right AICA.  Normal superior cerebellar arteries and PCA origins.  Bilateral PCA branches are within normal limits.  Antegrade flow in both ICA siphons.  The left ICA appears dominant, related to a dominant left ACA A1 segment.  Mild cavernous segment tortuosity.  No left ICA stenosis.  Normal ophthalmic artery origins.  Normal left ICA terminus, the right is hypoplastic. Normal MCA and left ACA origins.  Anterior communicating artery within normal limits.  Visualized ACA branches are within normal limits.  Visualized left MCA branches are within normal limits. Visualized right MCA branches are normal except for mildly patient at the right MCA trifurcation and a somewhat simplified branching pattern.  IMPRESSION: 1.  No intracranial stenosis or major branch occlusion. 2.  Anatomic variations including dominant left ICA and left ACA. 3.  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  Original Report Authenticated By: Harley Hallmark, M.D.   [name] Head/brain  Wo Cm  06/19/2011  *RADIOLOGY REPORT*  Clinical Data:  60 year old male with right side weakness, slurred speech, falls.  Possible stroke.  Comparison: Head CTs without contrast 06/18/2011 and earlier.  MRI HEAD WITHOUT CONTRAST  Technique: Multiplanar, multiecho pulse sequences of the brain and surrounding structures were obtained according to standard protocol without intravenous contrast.  Findings: No restricted diffusion to suggest acute infarction.  No midline shift, mass effect, evidence of mass lesion, ventriculomegaly, extra-axial collection or acute intracranial hemorrhage.  Cervicomedullary junction and pituitary are within normal limits.  Major intracranial vascular flow voids are preserved.  Mild for age scattered cerebral and pontine white matter  T2 and FLAIR hyperintensity.  Deep gray matter nuclei within normal limits.  Cerebellum within normal limits.  Negative for age visualized cervical spine.  Visualized bone marrow signal is within normal limits.  Visualized orbit soft tissues are within normal limits.  Minor paranasal sinus mucosal thickening.  Mastoids are clear. Visualized scalp soft tissues are within normal limits.  IMPRESSION: 1. No acute intracranial abnormality. 2.  Mild nonspecific white matter signal changes. 3.  MRA findings are below.  MRA HEAD WITHOUT CONTRAST  Technique: Angiographic images of the Circle of Willis were obtained using MRA technique without  intravenous contrast.  Findings: Antegrade flow in the posterior circulation codominant distal vertebral arteries.  Tortuous vertebrobasilar junction.  No basilar stenosis.  Normal left PICA.  Dominant right AICA.  Normal superior cerebellar arteries and PCA origins.  Bilateral PCA branches are within normal limits.  Antegrade flow in both ICA siphons.  The left ICA appears dominant, related to a dominant left ACA A1 segment.  Mild cavernous segment tortuosity.  No left ICA stenosis.  Normal ophthalmic artery origins.  Normal  left ICA terminus, the right is hypoplastic. Normal MCA and left ACA origins.  Anterior communicating artery within normal limits.  Visualized ACA branches are within normal limits.  Visualized left MCA branches are within normal limits. Visualized right MCA branches are normal except for mildly patient at the right MCA trifurcation and a somewhat simplified branching pattern.  IMPRESSION: 1.  No intracranial stenosis or major branch occlusion. 2.  Anatomic variations including dominant left ICA and left ACA. 3.  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  Original Report Authenticated By: Harley Hallmark, M.D.   Medications: reviewed.  Scheduled Meds:   . aspirin  81 mg Oral Daily  . ciprofloxacin  500 mg Oral BID  . imipramine  100 mg Oral QHS  . insulin aspart  0-15 Units Subcutaneous TID WC  . insulin aspart  0-5 Units Subcutaneous QHS  . polyethylene glycol  17 g Oral Daily   Continuous Infusions:   . sodium chloride 75 mL/hr at 06/19/11 0704   PRN Meds:.alum & mag hydroxide-simeth, cyclobenzaprine, HYDROcodone-acetaminophen, morphine, ondansetron (ZOFRAN) IV, ondansetron, senna-docusate, sodium chloride, DISCONTD: senna-docusate Assessment/Plan: New onset left facial drop/Right sided weakness: resolved. Ct head negative, MRI , mra, carotid duplex and 2 d echo pending. Appreciate Neuro input.  T12/L1 compression fracture with intractable back pain:  S/P Kyphoplasty  Continue pain control  Physical therapy  Cirrhosis: etiology unclear, hepatitis serology negative , spoke to Dr Loreta Ave who recommended to check labs including ANA,AMA,ASMAB,iron,ferritin,ceruloplasmin ,alpha 1 antitrypsin(all ordered) and she will call him for an appointment as outpatient.  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  DVt prophylaxis: SCDs    LOS: 5 days    Channell Quattrone 06/19/2011, 3:51 PM

## 2011-06-19 NOTE — Progress Notes (Signed)
Inpatient Diabetes Program Recommendations  AACE/ADA: New Consensus Statement on Inpatient Glycemic Control (2009)  Target Ranges:  Prepandial:   less than 140 mg/dL      Peak postprandial:   less than 180 mg/dL (1-2 hours)      Critically ill patients:  140 - 180 mg/dL   Reason for Visit: Hyperglycemia  Inpatient Diabetes Program Recommendations HgbA1C: check HgbA1C to assess glycemic control

## 2011-06-19 NOTE — Plan of Care (Signed)
Problem: Phase III Progression Outcomes Goal: Activity at appropriate level-compared to baseline (UP IN CHAIR FOR HEMODIALYSIS)  Outcome: Progressing PT continues to treat patient after transfer from [facility].  Patient needs Rehab to reach level that he can go home with wife (who can provide supervision only).  If Rehab will not take patient, recommend NHP with therapy.  Thanks.  [facility] [phone] (pager)

## 2011-06-19 NOTE — Consult Note (Signed)
Reason for Consult Stroke Referring Physician:   [name] is an 60 y.o. male.  HPI:  The patient is a 60 year old right-handed white male with a history diabetes, hypertension, and dyslipidemia. Patient apparently fell 2 weeks prior to admission and hurt his back, and got dizzy and fell again on the day of admission. The patient required kyphoplasty at the T12 level without complications on 06/16/2011. Patient was recovering, and was seen by rehabilitation. The patient indicates that on 06/17/2011, while up with the physical therapist, the patient was noted to have right sided weakness involving the leg, and possibly the arm. The patient indicates that on 06/18/2011, he recalls feeling fairly normal around 9:30 AM. The patient had taken medications for pain, and was dozing off and on. The patient was awakened somewhere around 11:10 AM, and was noted to be slurring his speech. The patient indicates that he was coming in and out of a sleeping state. The patient recalls dropping the telephone. A code stroke was called, and a CT scan of the brain was done, and showed no acute changes. The patient is found to have some mild drift on the right side, with some decreased pinprick sensation on the right side. A code stroke was called, but patient was not felt to be a candidate for TPA mainly secondary to a persistent pancytopenia that included a platelet level of 55. The patient was on low-dose aspirin prior to coming in the hospital. Neurology was asked to see this patient.    Past Medical History  Diagnosis Date  . Diabetes mellitus   . Hypertension   . High cholesterol   . Anxiety disorder   . Pancytopenia     Past Surgical History  Procedure Date  . Tonsillectomy     Family History  Problem Relation Age of Onset  . Coronary artery disease    . Hypertension    . Heart failure Father   . Heart failure Mother   . Stroke Paternal Grandmother   . Hypertension Mother   . Hypertension  Father   . Obesity Daughter     Social History:  reports that he has been smoking Cigarettes.  He does not have any smokeless tobacco history on file. He reports that he drinks about 1.1 ounces of alcohol per week. He reports that he does not use illicit drugs.  Allergies: No Known Allergies  Medications:  Scheduled:   . ciprofloxacin  500 mg Oral BID  . imipramine  100 mg Oral QHS  . insulin aspart  0-15 Units Subcutaneous TID WC  . insulin aspart  0-5 Units Subcutaneous QHS  . polyethylene glycol  17 g Oral Daily   Continuous:  WUJ:WJXB & mag hydroxide-simeth, cyclobenzaprine, HYDROcodone-acetaminophen, morphine, ondansetron (ZOFRAN) IV, ondansetron, senna-docusate, sodium chloride  Results for orders placed during the hospital encounter of 06/14/11 (from the past 48 hour(s))  GLUCOSE, CAPILLARY     Status: Abnormal   Collection Time   06/17/11  7:29 AM      Component Value Range Comment   Glucose-Capillary 139 (*) 70 - 99 (mg/dL)   GLUCOSE, CAPILLARY     Status: Abnormal   Collection Time   06/17/11 11:17 AM      Component Value Range Comment   Glucose-Capillary 325 (*) 70 - 99 (mg/dL)   ANA     Status: Normal   Collection Time   06/17/11 11:50 AM      Component Value Range Comment   ANA NEGATIVE  NEGATIVE  FERRITIN     Status: Normal   Collection Time   06/17/11 11:50 AM      Component Value Range Comment   Ferritin 101  22 - 322 (ng/mL)   CERULOPLASMIN     Status: Normal   Collection Time   06/17/11 11:50 AM      Component Value Range Comment   Ceruloplasmin 33  20 - 60 (mg/dL)   [id number]     Status: Normal   Collection Time   06/17/11 11:50 AM      Component Value Range Comment   A-1 Antitrypsin, Ser 157  90 - 200 (mg/dL)   GLUCOSE, CAPILLARY     Status: Abnormal   Collection Time   06/17/11  4:34 PM      Component Value Range Comment   Glucose-Capillary 126 (*) 70 - 99 (mg/dL)   GLUCOSE, CAPILLARY     Status: Abnormal   Collection Time    06/17/11  9:07 PM      Component Value Range Comment   Glucose-Capillary 173 (*) 70 - 99 (mg/dL)   GLUCOSE, CAPILLARY     Status: Abnormal   Collection Time   06/18/11  8:00 AM      Component Value Range Comment   Glucose-Capillary 139 (*) 70 - 99 (mg/dL)   GLUCOSE, CAPILLARY     Status: Abnormal   Collection Time   06/18/11 11:38 AM      Component Value Range Comment   Glucose-Capillary 231 (*) 70 - 99 (mg/dL)   GLUCOSE, CAPILLARY     Status: Abnormal   Collection Time   06/18/11 10:45 PM      Component Value Range Comment   Glucose-Capillary 240 (*) 70 - 99 (mg/dL)    Comment 1 Documented in Chart      Comment 2 Notify RN       Ct Head Wo Contrast  06/18/2011  *RADIOLOGY REPORT*  Clinical Data: Code stroke.  Right-sided weakness.  CT HEAD WITHOUT CONTRAST  Technique:  Contiguous axial images were obtained from the base of the skull through the vertex without contrast.  Comparison: CT 06/14/2011  Findings: Ventricles are normal.  Negative for hemorrhage or mass. No acute infarct.  No change from prior study.  Mild mucosal edema right maxillary sinus.  IMPRESSION: No acute intracranial abnormality.  Critical Value/emergent results were called by telephone at the time of interpretation on 06/18/2011  at 1230 hours  to  Dr. Cleotis Lema, who verbally acknowledged these results.  Original Report Authenticated By: Camelia Phenes, M.D.    @ROS @ Blood pressure 120/77, pulse 89, temperature 98.1 F (36.7 C), temperature source Oral, resp. rate 20, height 6\' 2"  (1.88 m), weight 94.7 kg (208 lb 12.4 oz), SpO2 98.00%.  Physical Examination:  General:  The patient is alert and cooperative at the time of the examination. The patient is oriented x3.  Respiratory:  Lungs fields are clear to auscultation bilaterally.  Cardiovascular:  Examination reveals a regular rate and rhythm, no obvious murmurs or rubs are noted.  Abdominal Exam:  Abdomen is soft and nontender, positive bowel sounds are  noted. No organomegaly is noted.  Extremities:  Extremities are without significant edema.    Neurologic Examination  Cranial Nerves:  Facial symmetry is present. Pupils are equal, round, and reactive to light. Visual fields are full. Speech is normal, no aphasia or dysarthria is noted. Pin prick sensation on the face is decreased on the right.  Motor Examination: Motor testing of all 4  extremities reveals normal strength of the arms and the legs to direct testing. The patient however, has mild drift involving the right arm and right leg.  Sensory Examination: Sensory examination of the arms and legs shows normal pinprick, soft touch, and vibration sensation throughout on the left. On the right, the patient appears to have some decreased pinprick sensation on the right leg as compared to the left. On the arms, pinprick sensation is symmetric.  Cerebellar Examination: The patient has good finger-nose-finger and heel-to-shin bilaterally. Gait was not tested.  Deep Tendon Reflexes: Deep tendon reflexes are symmetric and normal throughout. Toes are downgoing on the left, upgoing on the right.   Interval: Baseline (11/30 0100) Level of Consciousness (1a. ): Alert, keenly responsive (11/30 0100) LOC Questions (1b. ): Answers both questions correctly (11/30 0100) LOC Commands (1c. ): Performs both tasks correctly (11/30 0100) Best Gaze (2. ): Normal (11/30 0100) Visual (3. ): No visual loss (11/30 0100) Facial Palsy (4. ): Normal symmetrical movements (11/30 0100) Motor Arm, Left (5a. ): No drift (11/30 0100) Motor Arm, Right (5b. ): Drift (11/30 0100) Motor Leg, Left (6a. ): No drift (11/30 0100) Motor Leg, Right (6b. ): Drift (11/30 0100) Limb Ataxia (7. ): Absent (11/30 0100) Sensory (8. ): Mild-to-moderate sensory loss, patient feels pinprick is less sharp or is dull on the affected side, or there is a loss of superficial pain with pinprick, but patient is aware of being touched  (11/30 0100) Best Language (9. ): No aphasia (11/30 0100) Dysarthria (10. ): Normal (11/30 0100) Inattention/Extinction: No Abnormality (11/30 0100) Total: 3  (11/30 0100)   Assessment/Plan:  1. Mild right hemiparesis  2. Diabetes  3. Hypertension  4. Dyslipidemia  5. Recent T12 kyphoplasty  6. Pancytopenia  The patient has had onset of right-sided weakness at some point. It is not clear at all that the onset of the weakness was on 06/18/2011 when the code stroke was called. The patient indicates that he felt somewhat weak on the right side the day before while working with physical therapy. The patient will be subjected to a full stroke workup. The patient does have risk factors for stroke. The patient is not a TPA candidate secondary to minimal deficit, recent kyphoplasty, and unclear time of onset. We will perform a MRI of the brain, MRA of the head, 2-D echocardiogram, and carotid Doppler study. The patient will be treated with low-dose aspirin. The clinical course will be followed.   Regan Mcbryar KEITH 06/19/2011, 1:27 AM

## 2011-06-19 NOTE — Progress Notes (Signed)
*  PRELIMINARY RESULTS*  Carotid dopplers performed. Preliminary findings showed no ICA stenosis. Antegrade vertebral artery flow bilateral.  Garrett Turner 06/19/2011, 12:24 PM

## 2011-06-19 NOTE — Progress Notes (Signed)
  Echocardiogram 2D Echocardiogram has been performed.  Garrett Turner, Real Cons 06/19/2011, 12:40 PM

## 2011-06-20 LAB — GLUCOSE, CAPILLARY
Glucose-Capillary: 186 mg/dL — ABNORMAL HIGH (ref 70–99)
Glucose-Capillary: 267 mg/dL — ABNORMAL HIGH (ref 70–99)
Glucose-Capillary: 148 mg/dL — ABNORMAL HIGH (ref 70–99)

## 2011-06-20 MED ORDER — HYDROCODONE-ACETAMINOPHEN 5-325 MG PO TABS: 1.0000 | ORAL_TABLET | ORAL | Status: DC | PRN

## 2011-06-20 MED ORDER — POLYETHYLENE GLYCOL 3350 17 G PO PACK: 17.0000 g | PACK | Freq: Every day | ORAL | Status: DC

## 2011-06-20 MED ORDER — CYCLOBENZAPRINE HCL 5 MG PO TABS: 5.0000 mg | ORAL_TABLET | Freq: Two times a day (BID) | ORAL | Status: DC | PRN

## 2011-06-20 MED ORDER — SIMVASTATIN 20 MG PO TABS: 20.0000 mg | ORAL_TABLET | Freq: Every day | ORAL | Status: DC

## 2011-06-20 MED ORDER — SIMVASTATIN 20 MG PO TABS
20.0000 mg | ORAL_TABLET | Freq: Every day | ORAL | Status: DC
  Administered 2011-06-20: 20 mg via ORAL
  Filled 2011-06-20: qty 1

## 2011-06-20 MED ORDER — POLYETHYLENE GLYCOL 3350 17 G PO PACK: 17.0000 g | PACK | Freq: Every day | ORAL | Status: AC

## 2011-06-20 NOTE — Progress Notes (Signed)
SUBJECTIVE  Garrett Turner is a 60 y.o. male who who feels his stroke symptoms have resolved completely. He has been up and around and has no deficits. He wants to go home. He had an MRI scan echocardiogram Doppler studies all of them have come back as normal. OBJECTIVE Most recent Vital Signs: Temp: 98.8 F (37.1 C) (12/01 1010) Temp src: Oral (12/01 1010) BP: 119/65 mmHg (12/01 1010) Pulse Rate: 76  (12/01 1010) Respiratory Rate: 20 O2 Saturdation: 96%  CBG (last 3)   Basename 06/20/11 1227 06/20/11 0728 06/19/11 2211  GLUCAP 267* 186* 179*   Intake/Output from previous day: 11/30 0701 - 12/01 0700 In: -  Out: 400 [Urine:400]  IV Fluid Intake:     . sodium chloride 75 mL/hr at 06/19/11 0704   Diet:  Carb Control  Activity:  Up with assistance   DVT Prophylaxis:  ***  Studies: CBC    Component Value Date/Time   WBC 3.5* 06/16/2011 0437   RBC 3.69* 06/16/2011 0437   HGB 11.1* 06/16/2011 0437   HCT 32.4* 06/16/2011 0437   PLT 55* 06/16/2011 0437   MCV 87.8 06/16/2011 0437   MCH 30.1 06/16/2011 0437   MCHC 34.3 06/16/2011 0437   RDW 13.6 06/16/2011 0437   LYMPHSABS 1.5 04/18/2008 2125   MONOABS 0.5 04/18/2008 2125   EOSABS 0.2 04/18/2008 2125   BASOSABS 0.1 04/18/2008 2125   CMP    Component Value Date/Time   NA 137 06/15/2011 0452   K 3.8 06/15/2011 0452   CL 105 06/15/2011 0452   CO2 25 06/15/2011 0452   GLUCOSE 142* 06/15/2011 0452   BUN 14 06/15/2011 0452   CREATININE 0.80 06/15/2011 0452   CALCIUM 9.1 06/15/2011 0452   PROT 6.5 06/15/2011 0452   ALBUMIN 2.8* 06/15/2011 0452   AST 30 06/15/2011 0452   ALT 16 06/15/2011 0452   ALKPHOS 124* 06/15/2011 0452   BILITOT 0.6 06/15/2011 0452   GFRNONAA >90 06/15/2011 0452   GFRAA >90 06/15/2011 0452   COAGS Lab Results  Component Value Date   INR 1.21 06/16/2011   Lipid Panel    Component Value Date/Time   CHOL 217* 06/19/2011 0706   TRIG 97 06/19/2011 0706   HDL 34* 06/19/2011 0706   CHOLHDL  6.4 06/19/2011 0706   VLDL 19 06/19/2011 0706   LDLCALC 164* 06/19/2011 0706   HgbA1C  Lab Results  Component Value Date   HGBA1C 7.9* 06/19/2011   Urine Drug Screen    Component Value Date/Time   LABOPIA NONE DETECTED 06/14/2011 1553   COCAINSCRNUR NONE DETECTED 06/14/2011 1553   LABBENZ POSITIVE* 06/14/2011 1553   AMPHETMU NONE DETECTED 06/14/2011 1553   THCU NONE DETECTED 06/14/2011 1553   LABBARB NONE DETECTED 06/14/2011 1553    Alcohol Level    Component Value Date/Time   [facility] <11 06/14/2011 1403     [name] Contrast  06/19/2011  *RADIOLOGY REPORT*  Clinical Data:  60 year old male with right side weakness, slurred speech, falls.  Possible stroke.  Comparison: Head CTs without contrast 06/18/2011 and earlier.  MRI HEAD WITHOUT CONTRAST  Technique: Multiplanar, multiecho pulse sequences of the brain and surrounding structures were obtained according to standard protocol without intravenous contrast.  Findings: No restricted diffusion to suggest acute infarction.  No midline shift, mass effect, evidence of mass lesion, ventriculomegaly, extra-axial collection or acute intracranial hemorrhage.  Cervicomedullary junction and pituitary are within normal limits.  Major intracranial vascular flow voids are preserved.  Mild for  age scattered cerebral and pontine white matter T2 and FLAIR hyperintensity.  Deep gray matter nuclei within normal limits.  Cerebellum within normal limits.  Negative for age visualized cervical spine.  Visualized bone marrow signal is within normal limits.  Visualized orbit soft tissues are within normal limits.  Minor paranasal sinus mucosal thickening.  Mastoids are clear. Visualized scalp soft tissues are within normal limits.  IMPRESSION: 1. No acute intracranial abnormality. 2.  Mild nonspecific white matter signal changes. 3.  MRA findings are below.  MRA HEAD WITHOUT CONTRAST  Technique: Angiographic images of the Circle of Willis were obtained using MRA  technique without  intravenous contrast.  Findings: Antegrade flow in the posterior circulation codominant distal vertebral arteries.  Tortuous vertebrobasilar junction.  No basilar stenosis.  Normal left PICA.  Dominant right AICA.  Normal superior cerebellar arteries and PCA origins.  Bilateral PCA branches are within normal limits.  Antegrade flow in both ICA siphons.  The left ICA appears dominant, related to a dominant left ACA A1 segment.  Mild cavernous segment tortuosity.  No left ICA stenosis.  Normal ophthalmic artery origins.  Normal left ICA terminus, the right is hypoplastic. Normal MCA and left ACA origins.  Anterior communicating artery within normal limits.  Visualized ACA branches are within normal limits.  Visualized left MCA branches are within normal limits. Visualized right MCA branches are normal except for mildly patient at the right MCA trifurcation and a somewhat simplified branching pattern.  IMPRESSION: 1.  No intracranial stenosis or major branch occlusion. 2.  Anatomic variations including dominant left ICA and left ACA. 3.  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  Original Report Authenticated By: Harley Hallmark, M.D.   [name] Head/brain Wo Cm  06/19/2011  *RADIOLOGY REPORT*  Clinical Data:  60 year old male with right side weakness, slurred speech, falls.  Possible stroke.  Comparison: Head CTs without contrast 06/18/2011 and earlier.  MRI HEAD WITHOUT CONTRAST  Technique: Multiplanar, multiecho pulse sequences of the brain and surrounding structures were obtained according to standard protocol without intravenous contrast.  Findings: No restricted diffusion to suggest acute infarction.  No midline shift, mass effect, evidence of mass lesion, ventriculomegaly, extra-axial collection or acute intracranial hemorrhage.  Cervicomedullary junction and pituitary are within normal limits.  Major intracranial vascular flow voids are preserved.  Mild for age  scattered cerebral and pontine white matter T2 and FLAIR hyperintensity.  Deep gray matter nuclei within normal limits.  Cerebellum within normal limits.  Negative for age visualized cervical spine.  Visualized bone marrow signal is within normal limits.  Visualized orbit soft tissues are within normal limits.  Minor paranasal sinus mucosal thickening.  Mastoids are clear. Visualized scalp soft tissues are within normal limits.  IMPRESSION: 1. No acute intracranial abnormality. 2.  Mild nonspecific white matter signal changes. 3.  MRA findings are below.  MRA HEAD WITHOUT CONTRAST  Technique: Angiographic images of the Circle of Willis were obtained using MRA technique without  intravenous contrast.  Findings: Antegrade flow in the posterior circulation codominant distal vertebral arteries.  Tortuous vertebrobasilar junction.  No basilar stenosis.  Normal left PICA.  Dominant right AICA.  Normal superior cerebellar arteries and PCA origins.  Bilateral PCA branches are within normal limits.  Antegrade flow in both ICA siphons.  The left ICA appears dominant, related to a dominant left ACA A1 segment.  Mild cavernous segment tortuosity.  No left ICA stenosis.  Normal ophthalmic artery origins.  Normal left ICA terminus,  the right is hypoplastic. Normal MCA and left ACA origins.  Anterior communicating artery within normal limits.  Visualized ACA branches are within normal limits.  Visualized left MCA branches are within normal limits. Visualized right MCA branches are normal except for mildly patient at the right MCA trifurcation and a somewhat simplified branching pattern.  IMPRESSION: 1.  No intracranial stenosis or major branch occlusion. 2.  Anatomic variations including dominant left ICA and left ACA. 3.  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  Original Report Authenticated By: Harley Hallmark, M.D.   2DEcho : No cardiac source of embolism was identified, but cannot be ruled out on the  basis of this examination.     Physical Exam:    Heart rate regular. Breath sounds clear.hearing normal. No edema.  Neurological Exam. Patient alert and oriented x 3. Speech clear. No aphasia. No dysarthria. Extraoccular movements intact. Visual fields full. Face symmetric. Tongue midline. Moves all extremities x 4. Strength normal on left, right arm and leg drifts. Coordination normal. Sensation intact on the Left, decreased on the right.gait deferred.     ASSESSMENT Garrett Turner is a 59 y.o. male with left brain TIA.On aspirin 81 mg orally every day for secondary stroke prevention  Stroke risk factors:   diabetes mellitus, hyperlipidemia and hypertension     Hospital day # 6  TREATMENT/PLAN Continue aspirin 81 mg orally every day for stroke prevention.Strict control of HT and Lipids. Add statin. Dc home. F/U as outpatient.sign off Call for questions.  Gates Rigg, MD [facility] Pager: [phone] 06/20/2011 1:02 PM

## 2011-06-20 NOTE — Progress Notes (Signed)
SL removed from L FA, no bleeding noted, catheter tip intact.  Pt given d/c instructions along with d/u apts to be made and new prescriptions for vicodin, flexeril, zocor and mirilax.  Pt verbalized understanding of d/c instructions along with the need to call 911 with any stroke-like symptoms.  Pt d/c'd home via w/c; [facility] home care to f/u for PT needs.

## 2011-06-20 NOTE — Discharge Summary (Signed)
PATIENT DETAILS Name: Garrett Turner Age: 60 y.o. Sex: male Date of Birth: [birth year] MRN: [id number]. Admit Date: 06/14/2011 Admitting Physician: Gery Pray, MD PCP:No primary provider on file.  PRIMARY DISCHARGE DIAGNOSIS:  Principal Problem:  *Fall /sp kyphoplasty on 06/16/11 TIA. Active Problems:  Intractable pain  Diabetes mellitus  HTN (hypertension)  Hyperlipidemia  Cirrhosis  UTI (lower urinary tract infection)      PAST MEDICAL HISTORY: Past Medical History  Diagnosis Date  . Diabetes mellitus   . Hypertension   . High cholesterol   . Anxiety disorder   . Pancytopenia     DISCHARGE MEDICATIONS: Current Discharge Medication List    START taking these medications   Details  cyclobenzaprine (FLEXERIL) 5 MG tablet Take 1 tablet (5 mg total) by mouth every 12 (twelve) hours as needed for muscle spasms. Qty: 30 tablet, Refills: 0    HYDROcodone-acetaminophen (NORCO) 5-325 MG per tablet Take 1-2 tablets by mouth every 4 (four) hours as needed. Qty: 30 tablet, Refills: 0    polyethylene glycol (MIRALAX / GLYCOLAX) packet Take 17 g by mouth daily. Qty: 14 each, Refills: 0    simvastatin (ZOCOR) 20 MG tablet Take 1 tablet (20 mg total) by mouth daily at 6 PM. Qty: 30 tablet, Refills: 1      CONTINUE these medications which have NOT CHANGED   Details  aspirin EC 81 MG tablet Take 81 mg by mouth daily.     imipramine (TOFRANIL) 50 MG tablet Take 200 mg by mouth at bedtime.      naproxen sodium (ANAPROX) 220 MG tablet Take 440 mg by mouth 2 (two) times daily with a meal. For back pain      STOP taking these medications     diazepam (VALIUM) 5 MG tablet      HYDROcodone-acetaminophen (VICODIN) 5-500 MG per tablet      ciprofloxacin (CIPRO) 500 MG tablet      oxyCODONE-acetaminophen (PERCOCET) 5-325 MG per tablet          BRIEF HPI:  See H&P, Labs, Consult and Test reports for all details in brief, patient was admitted for a fall, was found  to have T12 fracture, s/p kyphoplasty on 11/27 .   CONSULTATIONS:  IR Neurology  PERTINENT RADIOLOGIC STUDIES: Ct Abdomen Pelvis Wo Contrast  06/08/2011  *RADIOLOGY REPORT*  Clinical Data: Back pain, UTI  CT ABDOMEN AND PELVIS WITHOUT CONTRAST  Technique:  Multidetector CT imaging of the abdomen and pelvis was performed following the standard protocol without intravenous contrast.  Comparison: None.  Findings: Minimal basilar atelectasis / scarring scattered bilaterally.  Normal heart size.  No pericardial or pleural effusion.  Slight symmetric gynecomastia, image 8.  Abdomen: Irregular nodular liver contour suspicious for underlying hepatic cirrhosis.  Small amount of perihepatic right upper quadrant ascites.  Benign-appearing calcification adjacent to the gallbladder and appears outside the biliary tree, possibly vascular.  Slight distention of the gallbladder.  No definite biliary dilatation or obstruction.  There is diffuse mesenteric vascular congestion/mild edema.  In the left upper quadrant, there are tortuous vascular structures in the splenic hilum and along the GE junction consistent with varices.  These findings are likely secondary to chronic portal hypertension.  Spleen appears enlarged roughly measuring 15 cm.  Pancreas is diffusely atrophic.  No pancreatic ductal dilatation identified.  Visualized adrenal glands are symmetric.  Kidneys demonstrate no obstruction, urinary tract calculus, hydronephrosis or obstructive uropathy.  Exam is limited without IV or oral contrast.  No abdominal of  fluid collection, abscess, hemorrhage, or free air.  No bowel obstruction of pattern, dilatation, ileus.  Retained stool throughout the colon.  Aortic atherosclerosis noted.  No aneurysm.  Pelvis:  Pelvic dependent ascites noted superior to the bladder. Bladder is decompressed.  No distal ureteral calculus or UVJ abnormality.  Scattered mild colonic diverticulosis.  No pelvic abscess, hemorrhage, adenopathy,  or inguinal abnormality. Bilateral inguinal hernias noted containing fat and ascites.  Degenerative changes of the spine.  Chronic compression fracture at T12.  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  Splenomegaly  Diverticulosis  Dependent pelvic ascites as well.  Bilateral inguinal hernias containing fat and ascitic fluid.  Original Report Authenticated By: Judie Petit. Ruel Favors, M.D.   Dg Lumbar Spine Complete  06/14/2011  *RADIOLOGY REPORT*  Clinical Data: 60 year old male with low back pain following fall.  LUMBAR SPINE - COMPLETE 4+ VIEW  Comparison: 06/08/2011  Findings: Five non-rib bearing lumbar type vertebra are again identified in normal alignment. A 50% T12 compression fracture appears stable since the prior study.  Minimal compression of the L1 inferior endplate is unchanged as well. There is no evidence of acute fracture or subluxation. Mild degenerative disc disease at T11-T12 and L2-L3 are again noted. Bilateral L5 pars defects are again noted without spondylolisthesis.  IMPRESSION: No evidence of acute bony abnormality.  Unchanged T12 compression fracture and mild L1 inferior endplate compression.  Bilateral L5 pars defects without spondylolisthesis.  Original Report Authenticated By: Rosendo Gros, M.D.   Dg Lumbar Spine Complete  06/08/2011  *RADIOLOGY REPORT*  Clinical Data: Severe low back pain, no fall or injury  LUMBAR SPINE - COMPLETE 4+ VIEW  Comparison: None.  Findings: There is an anterior compression deformity of the T12 vertebral body with approximately 20% loss vertebral body height. No retropulsion.  No subluxation.  There is endplate osteophytosis at multiple levels in the lumbar spine.  Oblique projections demonstrate no pars fracture.  IMPRESSION:  1.  Wedge compression fracture the T12-2 body of indeterminate age. 2.  Multiple levels of disc osteophytic  disease.  Original Report Authenticated By: Genevive Bi, M.D.   Ct Head Wo Contrast  06/18/2011  *RADIOLOGY REPORT*  Clinical Data: Code stroke.  Right-sided weakness.  CT HEAD WITHOUT CONTRAST  Technique:  Contiguous axial images were obtained from the base of the skull through the vertex without contrast.  Comparison: CT 06/14/2011  Findings: Ventricles are normal.  Negative for hemorrhage or mass. No acute infarct.  No change from prior study.  Mild mucosal edema right maxillary sinus.  IMPRESSION: No acute intracranial abnormality.  Critical Value/emergent results were called by telephone at the time of interpretation on 06/18/2011  at 1230 hours  to  Dr. Cleotis Lema, who verbally acknowledged these results.  Original Report Authenticated By: Camelia Phenes, M.D.   Ct Head Wo Contrast  06/14/2011  *RADIOLOGY REPORT*  Clinical Data:  60 year old male with headache and neck pain following fall.  CT HEAD WITHOUT CONTRAST CT CERVICAL SPINE WITHOUT CONTRAST  Technique:  Multidetector CT imaging of the head and cervical spine was performed following the standard protocol without intravenous contrast.  Multiplanar CT image reconstructions of the cervical spine were also generated.  Comparison:  None  CT HEAD  Findings: No acute intracranial abnormalities are identified, including mass lesion or mass effect, hydrocephalus, extra-axial fluid collection, midline shift, hemorrhage, or acute infarction.  The visualized bony calvarium is unremarkable.  IMPRESSION: No evidence  of acute intracranial abnormality.  CT CERVICAL SPINE  Findings: Normal alignment is noted. There is no evidence of fracture, subluxation or prevertebral soft tissue swelling. Moderate degenerative disc disease, spondylosis and broad-based disc bulge at C4-C5 noted contributing to moderate central spinal and biforaminal narrowing. The disc spaces are otherwise maintained. No focal bony lesions are noted. No soft tissue abnormalities are noted.   IMPRESSION: No static evidence of acute injury to the cervical spine.  Moderate degenerative changes at C4-C5 contributing to moderate central spinal biforaminal narrowing.  Original Report Authenticated By: Rosendo Gros, M.D.   Ct Cervical Spine Wo Contrast  06/14/2011  *RADIOLOGY REPORT*  Clinical Data:  60 year old male with headache and neck pain following fall.  CT HEAD WITHOUT CONTRAST CT CERVICAL SPINE WITHOUT CONTRAST  Technique:  Multidetector CT imaging of the head and cervical spine was performed following the standard protocol without intravenous contrast.  Multiplanar CT image reconstructions of the cervical spine were also generated.  Comparison:  None  CT HEAD  Findings: No acute intracranial abnormalities are identified, including mass lesion or mass effect, hydrocephalus, extra-axial fluid collection, midline shift, hemorrhage, or acute infarction.  The visualized bony calvarium is unremarkable.  IMPRESSION: No evidence of acute intracranial abnormality.  CT CERVICAL SPINE  Findings: Normal alignment is noted. There is no evidence of fracture, subluxation or prevertebral soft tissue swelling. Moderate degenerative disc disease, spondylosis and broad-based disc bulge at C4-C5 noted contributing to moderate central spinal and biforaminal narrowing. The disc spaces are otherwise maintained. No focal bony lesions are noted. No soft tissue abnormalities are noted.  IMPRESSION: No static evidence of acute injury to the cervical spine.  Moderate degenerative changes at C4-C5 contributing to moderate central spinal biforaminal narrowing.  Original Report Authenticated By: Rosendo Gros, M.D.   [name] Contrast  06/19/2011  *RADIOLOGY REPORT*  Clinical Data:  60 year old male with right side weakness, slurred speech, falls.  Possible stroke.  Comparison: Head CTs without contrast 06/18/2011 and earlier.  MRI HEAD WITHOUT CONTRAST  Technique: Multiplanar, multiecho pulse sequences of the brain  and surrounding structures were obtained according to standard protocol without intravenous contrast.  Findings: No restricted diffusion to suggest acute infarction.  No midline shift, mass effect, evidence of mass lesion, ventriculomegaly, extra-axial collection or acute intracranial hemorrhage.  Cervicomedullary junction and pituitary are within normal limits.  Major intracranial vascular flow voids are preserved.  Mild for age scattered cerebral and pontine white matter T2 and FLAIR hyperintensity.  Deep gray matter nuclei within normal limits.  Cerebellum within normal limits.  Negative for age visualized cervical spine.  Visualized bone marrow signal is within normal limits.  Visualized orbit soft tissues are within normal limits.  Minor paranasal sinus mucosal thickening.  Mastoids are clear. Visualized scalp soft tissues are within normal limits.  IMPRESSION: 1. No acute intracranial abnormality. 2.  Mild nonspecific white matter signal changes. 3.  MRA findings are below.  MRA HEAD WITHOUT CONTRAST  Technique: Angiographic images of the Circle of Willis were obtained using MRA technique without  intravenous contrast.  Findings: Antegrade flow in the posterior circulation codominant distal vertebral arteries.  Tortuous vertebrobasilar junction.  No basilar stenosis.  Normal left PICA.  Dominant right AICA.  Normal superior cerebellar arteries and PCA origins.  Bilateral PCA branches are within normal limits.  Antegrade flow in both ICA siphons.  The left ICA appears dominant, related to a dominant left ACA A1 segment.  Mild cavernous segment tortuosity.  No left  ICA stenosis.  Normal ophthalmic artery origins.  Normal left ICA terminus, the right is hypoplastic. Normal MCA and left ACA origins.  Anterior communicating artery within normal limits.  Visualized ACA branches are within normal limits.  Visualized left MCA branches are within normal limits. Visualized right MCA branches are normal except for mildly  patient at the right MCA trifurcation and a somewhat simplified branching pattern.  IMPRESSION: 1.  No intracranial stenosis or major branch occlusion. 2.  Anatomic variations including dominant left ICA and left ACA. 3.  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  Original Report Authenticated By: Harley Hallmark, M.D.   Mr Lumbar Spine W Wo Contrast  06/16/2011  *RADIOLOGY REPORT*  Clinical Data: Severe low back pain.  Evaluate for probable T12 and L1 compression fractures.  MRI LUMBAR SPINE WITHOUT AND WITH CONTRAST  Technique:  Multiplanar and multiecho pulse sequences of the lumbar spine were obtained without and with intravenous contrast.  Contrast: 18mL MULTIHANCE GADOBENATE DIMEGLUMINE 529 MG/ML IV SOLN  Comparison: Lumbar spine radiographs 06/08/2011 and 06/14/2011. Abdominal CT 06/08/2011.  Findings: There is a mild convex left scoliosis.  Superior endplate compression fracture at T12 is associated with marrow edema in the vertebral body, 2 mm of osseous retropulsion and approximately 50% loss of vertebral body height.  This fracture has not significantly progressed over the last 8 days.  There is a Schmorl's node involving the inferior endplate of L4 on the right.  There is mild adjacent marrow edema and enhancement. This also appears stable compared with the recent CT.  No other fractures are identified.  An inferior L1 endplate Schmorl's node appears chronic and without adjacent marrow edema. There are chronic L5 pars defects bilaterally without significant anterolisthesis.  The conus medullaris extends to the T12-L1 level and appears normal. There is no abnormal intradural enhancement.  There are no paraspinal abnormalities.  Findings consistent with cirrhosis and portal hypertension are partially imaged and appear unchanged.  L1-L2:  Inferior endplate compression deformity.  No significant disc bulging, spinal stenosis or nerve root encroachment.  L2-L3:  There is disc  desiccation with annular disc bulging mildly eccentric to the right.  There is mild narrowing of the lateral recesses.  The foramina are patent.  L3-L4:  Disc height and hydration are maintained.  There is minimal disc bulging with mild facet and ligamentous hypertrophy.  No spinal stenosis or nerve root encroachment results.  L4-L5:  There is mild disc bulging with shallow central and right foraminal disc protrusions.  There is mild narrowing of the lateral recesses and right foramen.  Mild facet hypertrophy is present bilaterally.  L5-S1:  As above, there are bilateral L5 pars defects with mild annular disc bulging.  The foramina are mildly narrowed bilaterally.  There is no sacral nerve root encroachment.  IMPRESSION:  1.  Superior endplate compression deformity at T12 is associated with marrow edema and enhancement, consistent with a subacute fracture.  There is approximately 50% loss of vertebral body height with mild osseous retropulsion. 2.  No other acute fractures are identified.  There is mild marrow edema surrounding a Schmorl's node involving the inferior endplate of L4. 3.  Mild spondylosis for age, most advanced at the L2-L3 and L4-L5 levels.  There is no high-grade spinal stenosis or definite nerve root encroachment. 4.  Bilateral L5 pars defects with mild resulting biforaminal stenosis at L5-S1.  Original Report Authenticated By: Gerrianne Scale, M.D.   Ir Kyphoplasty Or Sacroplasty  06/16/2011  *  RADIOLOGY REPORT*   Clinical Data:  Patient with post-traumatic painful compression fracture deformity of T12.  KYPHOPLASTY AT T12: Technique and findings: The procedure, risks (including but not limited to bleeding, infection, organ damage), benefits, and alternatives were explained to the patient.  Questions regarding the procedure were encouraged and answered.  The patient understands and consents to the procedure. The patient was placed prone on the fluoroscopic table.  The skin overlying the T12  region was then prepped and draped in the usual sterile fashion.  Intravenous Fentanyl and Versed were administered as conscious sedation during continuous cardiorespiratory monitoring by the radiology RN, with a total moderate sedation time of 32 minutes.  The right pedicle at T12 was then infiltrated with 1% lidocaine followed by the advancement of a Kyphon trocar needle, advanced until the tip of the working cannula was inside the posterior one- third at T12. The osteo drill was advanced to  within 5 mm of the anterior aspect of T12. Through the working cannula, a Kyphon inflatable bone tamp 15 x 5 was advanced and positioned with the distal marker 5 mm from the anterior aspect of T12.  Crossing of the midline was seen on the AP projection.  At this time, the balloon was expanded using contrast via a Kyphon inflation syringe device via micro tubing. Inflations were continued until there was apposition with the superior and the inferior end plates. At this time, methylmethacrylate mixture was reconstituted   in the Kyphon bone mixing device system.  This was then loaded into the delivery device. The balloon was deflated and removed followed by the instillation of    methylmethacrylate mixture at T12 with excellent filling in the AP and lateral projections.  No extravasation was noted in the disk spaces or posteriorly into the spinal canal.  No epidural venous contamination was seen. The patient tolerated the procedure well.  There were no acute complications.  The working cannula and the bone filler were then retrieved and removed. IMPRESSION: 1.  Status post vertebral body augmentation using balloon kyphoplasty at T12 as described without event. Original Report Authenticated By: Osa Craver, M.D.   [name] Head/brain Wo Cm  06/19/2011  *RADIOLOGY REPORT*  Clinical Data:  60 year old male with right side weakness, slurred speech, falls.  Possible stroke.  Comparison: Head CTs without contrast  06/18/2011 and earlier.  MRI HEAD WITHOUT CONTRAST  Technique: Multiplanar, multiecho pulse sequences of the brain and surrounding structures were obtained according to standard protocol without intravenous contrast.  Findings: No restricted diffusion to suggest acute infarction.  No midline shift, mass effect, evidence of mass lesion, ventriculomegaly, extra-axial collection or acute intracranial hemorrhage.  Cervicomedullary junction and pituitary are within normal limits.  Major intracranial vascular flow voids are preserved.  Mild for age scattered cerebral and pontine white matter T2 and FLAIR hyperintensity.  Deep gray matter nuclei within normal limits.  Cerebellum within normal limits.  Negative for age visualized cervical spine.  Visualized bone marrow signal is within normal limits.  Visualized orbit soft tissues are within normal limits.  Minor paranasal sinus mucosal thickening.  Mastoids are clear. Visualized scalp soft tissues are within normal limits.  IMPRESSION: 1. No acute intracranial abnormality. 2.  Mild nonspecific white matter signal changes. 3.  MRA findings are below.  MRA HEAD WITHOUT CONTRAST  Technique: Angiographic images of the Circle of Willis were obtained using MRA technique without  intravenous contrast.  Findings: Antegrade flow in the posterior circulation codominant distal vertebral arteries.  Tortuous vertebrobasilar junction.  No basilar stenosis.  Normal left PICA.  Dominant right AICA.  Normal superior cerebellar arteries and PCA origins.  Bilateral PCA branches are within normal limits.  Antegrade flow in both ICA siphons.  The left ICA appears dominant, related to a dominant left ACA A1 segment.  Mild cavernous segment tortuosity.  No left ICA stenosis.  Normal ophthalmic artery origins.  Normal left ICA terminus, the right is hypoplastic. Normal MCA and left ACA origins.  Anterior communicating artery within normal limits.  Visualized ACA branches are within normal limits.   Visualized left MCA branches are within normal limits. Visualized right MCA branches are normal except for mildly patient at the right MCA trifurcation and a somewhat simplified branching pattern.  IMPRESSION: 1.  No intracranial stenosis or major branch occlusion. 2.  Anatomic variations including dominant left ICA and left ACA. 3.  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  Original Report Authenticated By: Harley Hallmark, M.D.     PERTINENT LAB RESULTS: CBC: No results found for this basename: WBC:2,HGB:2,HCT:2,PLT:2 in the last 72 hours CMET CMP     Component Value Date/Time   NA 137 06/15/2011 0452   K 3.8 06/15/2011 0452   CL 105 06/15/2011 0452   CO2 25 06/15/2011 0452   GLUCOSE 142* 06/15/2011 0452   BUN 14 06/15/2011 0452   CREATININE 0.80 06/15/2011 0452   CALCIUM 9.1 06/15/2011 0452   PROT 6.5 06/15/2011 0452   ALBUMIN 2.8* 06/15/2011 0452   AST 30 06/15/2011 0452   ALT 16 06/15/2011 0452   ALKPHOS 124* 06/15/2011 0452   BILITOT 0.6 06/15/2011 0452   GFRNONAA >90 06/15/2011 0452   GFRAA >90 06/15/2011 0452    GFR Estimated Creatinine Clearance: 111.3 ml/min (by C-G formula based on Cr of 0.8). No results found for this basename: LIPASE:2,AMYLASE:2 in the last 72 hours No results found for this basename: CKTOTAL:3,CKMB:3,CKMBINDEX:3,TROPONINI:3 in the last 72 hours No results found for this basename: POCBNP:3 in the last 72 hours No results found for this basename: DDIMER:2 in the last 72 hours  Basename 06/19/11 0706  HGBA1C 7.9*    Basename 06/19/11 0706  CHOL 217*  HDL 34*  LDLCALC 164*  TRIG 97  CHOLHDL 6.4  LDLDIRECT --   No results found for this basename: TSH,T4TOTAL,FREET3,T3FREE,THYROIDAB in the last 72 hours No results found for this basename: VITAMINB12:2,FOLATE:2,FERRITIN:2,TIBC:2,IRON:2,RETICCTPCT:2 in the last 72 hours Coags: No results found for this basename: PT:2,INR:2 in the last 72 hours Microbiology: No results  found for this or any previous visit (from the past 240 hour(s)).   BRIEF HOSPITAL COURSE: 60 year old gentle man came in for a fall, was found to have a T 12 fracture, underwent a kyphoplasty on 11/27, as he was doing his physical therapy, he was found to have right arm weakness and slurred speech, and a code stroke was called. He was sent to [facility] for further work up, he had CT, MRI , MRA OF THE HEAD , which were unremarkable. He was discharged home on aspirin and recommended to follow up with Dr Pearlean Brownie in 4 weeks.   Principal Problem:  *Fall s/p kyphoplasty continue with home PT, and pain control as needed.  Active Problems:  Intractable pain pain control  Diabetes mellitus: outpatient work up.  HTN (hypertension) controlled  Hyperlipidemia on zocor.  Cirrhosis     TODAY-DAY OF DISCHARGE:  Subjective:   Garrett Turner today has no headache,no chest abdominal pain,no  new weakness tingling or numbness, feels much better wants to go home today.   Objective:   Blood pressure 114/75, pulse 88, temperature 98 F (36.7 C), temperature source Oral, resp. rate 18, height 6\' 2"  (1.88 m), weight 94.7 kg (208 lb 12.4 oz), SpO2 98.00%.  Intake/Output Summary (Last 24 hours) at 06/20/11 1630 Last data filed at 06/20/11 0700  Gross per 24 hour  Intake      0 ml  Output    400 ml  Net   -400 ml    Exam Awake Alert, Oriented *3, No new F.N deficits, Normal affect Carver.AT,PERRAL Supple Neck,No JVD, No cervical lymphadenopathy appriciated.  Symmetrical Chest wall movement, Good air movement bilaterally, CTAB RRR,No Gallops,Rubs or new Murmurs, No Parasternal Heave +ve B.Sounds, Abd Soft, Non tender, No organomegaly appriciated, No rebound -guarding or rigidity. No Cyanosis, Clubbing or edema, No new Rash or bruise  DISPOSITION:   DISCHARGE INSTRUCTIONS:    Follow-up Information    Follow up with PCP in 1 week.      Follow up with Gates Rigg, MD. (As needed)      Contact information:   [address] [facility] [place] [address] [phone]           Total Time spent on discharge equals 45 minutes.  SignedKathlen Mody 06/20/2011 4:30 PM

## 2011-06-21 NOTE — Progress Notes (Signed)
Case Management:   06/21/11 0920 Spoke with Care Coordinator about [facility] orders for pt.  TC to [place], with [facility] for referral of [facility] PT.  In addition, TC made to pt. At [phone]) to inform of [facility] PT [facility] 24-48hr post discharge.  Tera Mater, RN, BSN Case Manager # [phone].

## 2011-06-27 ENCOUNTER — Emergency Department (HOSPITAL_COMMUNITY)
Admission: EM | Admit: 2011-06-27 | Discharge: 2011-06-28 | Disposition: A | Discharge status: Home / Self Care | Payer: Worker's Compensation | Attending: Emergency Medicine | Admitting: Emergency Medicine

## 2011-06-27 ENCOUNTER — Emergency Department (HOSPITAL_COMMUNITY): Payer: Worker's Compensation

## 2011-06-27 ENCOUNTER — Encounter (HOSPITAL_COMMUNITY): Payer: Self-pay | Admitting: *Deleted

## 2011-06-27 DIAGNOSIS — I10 Essential (primary) hypertension: Secondary | ICD-10-CM | POA: Insufficient documentation

## 2011-06-27 DIAGNOSIS — M549 Dorsalgia, unspecified: Secondary | ICD-10-CM | POA: Insufficient documentation

## 2011-06-27 DIAGNOSIS — E119 Type 2 diabetes mellitus without complications: Secondary | ICD-10-CM | POA: Insufficient documentation

## 2011-06-27 DIAGNOSIS — M47814 Spondylosis without myelopathy or radiculopathy, thoracic region: Secondary | ICD-10-CM | POA: Insufficient documentation

## 2011-06-27 DIAGNOSIS — X500XXA Overexertion from strenuous movement or load, initial encounter: Secondary | ICD-10-CM | POA: Insufficient documentation

## 2011-06-27 NOTE — ED Notes (Signed)
Pt had back surgery last wk for compression fracture; today went to plug in christmas lights bent over and twisted and heard "pop" and developed stabbing pain to back; since then increased pain and tight feeling

## 2011-06-27 NOTE — ED Notes (Signed)
Patient transported to X-ray. the patient ambulated to x-ray

## 2011-06-27 NOTE — ED Provider Notes (Signed)
History     CSN: [id number] Arrival date & time: 06/27/2011  9:29 PM   First MD Initiated Contact with Patient 06/27/11 2322      Chief Complaint  Patient presents with  . Back Pain    HPI  History provided by the patient. Patient presents with complaints of acute back pain after bending down and feeling a pop in back. Patient underwent recent kyphoplasty on December 1 for compression fracture of T12. Patient had been doing well following the procedure and trying to take it easy. Pain is worse with movement of back and especially with lying flat. Pain improves in some positions.  Pt took some pain medication with some improvement. Patient denies radiation of pain. Patient denies any numbness, tingling or weakness in lower extremities. he denies any urinary or fecal incontinence.  Pt has been ambulatory.   Past Medical History  Diagnosis Date  . Diabetes mellitus   . Hypertension   . High cholesterol   . Anxiety disorder   . Pancytopenia     Past Surgical History  Procedure Date  . Tonsillectomy     Family History  Problem Relation Age of Onset  . Coronary artery disease    . Hypertension    . Heart failure Father   . Heart failure Mother   . Stroke Paternal Grandmother   . Hypertension Mother   . Hypertension Father   . Obesity Daughter     History  Substance Use Topics  . Smoking status: Current Some Day Smoker    Types: Cigarettes  . Smokeless tobacco: Not on file  . Alcohol Use: 1.1 oz/week    1 Glasses of wine, 1 Drinks containing 0.5 oz of alcohol per week      Review of Systems  Constitutional: Negative for fever and chills.  Respiratory: Negative for cough and shortness of breath.   Cardiovascular: Negative for chest pain.  Gastrointestinal: Negative for nausea, vomiting, diarrhea and constipation.  Genitourinary: Negative for dysuria.  All other systems reviewed and are negative.    Allergies  Review of patient's allergies indicates no known  allergies.  Home Medications   Current Outpatient Rx  Name Route Sig Dispense Refill  . ASPIRIN EC 81 MG PO TBEC Oral Take 81 mg by mouth daily.     . CYCLOBENZAPRINE HCL 5 MG PO TABS Oral Take 1 tablet (5 mg total) by mouth every 12 (twelve) hours as needed for muscle spasms. 30 tablet 0  . HYDROCODONE-ACETAMINOPHEN 5-325 MG PO TABS Oral Take 1-2 tablets by mouth every 4 (four) hours as needed. 30 tablet 0  . IMIPRAMINE HCL 50 MG PO TABS Oral Take 200 mg by mouth at bedtime.      Marland Kitchen NAPROXEN SODIUM 220 MG PO TABS Oral Take 440 mg by mouth 2 (two) times daily with a meal. For back pain    . SIMVASTATIN 20 MG PO TABS Oral Take 1 tablet (20 mg total) by mouth daily at 6 PM. 30 tablet 1    BP 126/77  Pulse 81  Temp(Src) 98.6 F (37 C) (Oral)  Resp 17  Ht 6\' 2"  (1.88 m)  Wt 208 lb (94.348 kg)  BMI 26.71 kg/m2  SpO2 99%  Physical Exam  Nursing note and vitals reviewed. Constitutional: He appears well-developed and well-nourished. No distress.  HENT:  Head: Normocephalic.  Cardiovascular: Normal rate, regular rhythm and normal heart sounds.   Pulmonary/Chest: Effort normal and breath sounds normal. No respiratory distress. He has no wheezes.  He has no rales.  Musculoskeletal:       Cervical back: Normal.       Thoracic back: He exhibits decreased range of motion and bony tenderness.       Lumbar back: He exhibits decreased range of motion and bony tenderness.    ED Course  Procedures (including critical care time)  Labs Reviewed - No data to display Dg Thoracic Spine 2 View  06/27/2011  *RADIOLOGY REPORT*  Clinical Data: T12 kyphoplasty 1 week ago; audible pop in the low back with associated pain near this level.  THORACIC SPINE - 2 VIEW 06/27/2011:  Comparison: Images obtained at the time of kyphoplasty 06/16/2011.  Findings: Methylmethacrylate within the T12 vertebral body, without evidence of extrusion into the epidural venous plexus.  No new/acute fractures involving the  thoracic spine.  Generalized osteopenia.  Mild diffuse spondylosis.  Swimmer's image demonstrates degenerative disc disease and spondylosis at the C4-5.  IMPRESSION: No complicating features post T12 kyphoplasty.  No acute osseous abnormality.  Osteopenia.  Mild diffuse thoracic spondylosis.  Original Report Authenticated By: Arnell Sieving, M.D.     1. Back pain      MDM  11:30 PM patient seen and evaluated. Patient in no acute distress. Pt ambulatory with normal gait.          Angus Seller, [place] 06/28/11 [phone]

## 2011-06-28 MED ORDER — DIAZEPAM 5 MG PO TABS: 5.0000 mg | ORAL_TABLET | Freq: Four times a day (QID) | ORAL | Status: AC | PRN

## 2011-06-28 MED ORDER — OXYCODONE-ACETAMINOPHEN 5-325 MG PO TABS: 2.0000 | ORAL_TABLET | ORAL | Status: AC | PRN

## 2011-06-28 NOTE — ED Provider Notes (Signed)
Medical screening examination/treatment/procedure(s) were performed by non-physician practitioner and as supervising physician I was immediately available for consultation/collaboration.   Dayton Bailiff, MD 06/28/11 2258

## 2011-06-30 ENCOUNTER — Other Ambulatory Visit: Payer: Self-pay | Admitting: Interventional Radiology

## 2011-06-30 DIAGNOSIS — M549 Dorsalgia, unspecified: Secondary | ICD-10-CM

## 2011-07-01 ENCOUNTER — Ambulatory Visit
Admission: RE | Admit: 2011-07-01 | Discharge: 2011-07-01 | Disposition: A | Discharge status: Home / Self Care | Payer: Worker's Compensation | Source: Ambulatory Visit | Attending: Interventional Radiology | Admitting: Interventional Radiology

## 2011-07-01 ENCOUNTER — Other Ambulatory Visit: Payer: Self-pay | Admitting: Emergency Medicine

## 2011-07-01 VITALS — BP 131/73 | HR 77 | Temp 98.8°F | Resp 16 | Ht 74.0 in | Wt 208.0 lb

## 2011-07-01 DIAGNOSIS — G8929 Other chronic pain: Secondary | ICD-10-CM

## 2011-07-01 DIAGNOSIS — M549 Dorsalgia, unspecified: Secondary | ICD-10-CM

## 2011-07-01 MED ORDER — CYCLOBENZAPRINE HCL 5 MG PO TABS: 5.0000 mg | ORAL_TABLET | Freq: Two times a day (BID) | ORAL | Status: AC | PRN

## 2011-07-01 MED ORDER — HYDROCODONE-ACETAMINOPHEN 5-325 MG PO TABS: 1.0000 | ORAL_TABLET | ORAL | Status: AC | PRN

## 2011-07-01 NOTE — Progress Notes (Signed)
Pt states that he has been taking hydrocodone 4-6 tabs/day for pain.  Describes pain as dull, constant ache.  Rates that pain as 4-7 on pain scale of 1-10.    Pt also reports that he was seen at [facility] ED on Saturday, June 27, 2011 after stretching to plug in Christmas lights.  States that he felt & heard a popping sound in his back.  Describes pain level as increasing after that incident to 7-8.  Thoracic spine images obtained.  Pt states that he received 2 new Rx's.    Pt states that minimal activity increases discomfort.

## 2011-07-02 ENCOUNTER — Other Ambulatory Visit: Payer: Self-pay | Admitting: Interventional Radiology

## 2011-07-02 ENCOUNTER — Encounter: Payer: Self-pay | Admitting: Emergency Medicine

## 2011-07-02 DIAGNOSIS — M549 Dorsalgia, unspecified: Secondary | ICD-10-CM

## 2011-07-06 ENCOUNTER — Telehealth: Payer: Self-pay | Admitting: Emergency Medicine

## 2011-07-06 NOTE — Telephone Encounter (Signed)
Pt needs a new letter for work stating that he may not return until he is reassessed by Dr Deanne Coffer.  He is not any better than last week since being on the pain meds.  I also advised pt that he should NOT be driving while he is on the pain meds and flexeril.  He was stating that the was having issues driving and balancing himself.  LM for Dr Deanne Coffer to call me back- 2:44pm  LM with wife at 4:50pm to have pt. Call me back in the morning, so we can get the letter taken care of.   07-07-11- at 8:32am- spoke w/ pt to extend letter for being out of work until he is reassessed by Dr Deanne Coffer at f/u appt. In mid Jan. 2013.

## 2011-07-08 ENCOUNTER — Other Ambulatory Visit: Payer: Self-pay | Admitting: Interventional Radiology

## 2011-07-08 ENCOUNTER — Encounter: Payer: Self-pay | Admitting: Radiology

## 2011-07-08 DIAGNOSIS — M549 Dorsalgia, unspecified: Secondary | ICD-10-CM

## 2011-07-22 ENCOUNTER — Other Ambulatory Visit: Payer: Self-pay

## 2011-07-22 ENCOUNTER — Other Ambulatory Visit: Payer: Self-pay | Admitting: Radiology

## 2011-07-22 ENCOUNTER — Ambulatory Visit
Admission: RE | Admit: 2011-07-22 | Discharge: 2011-07-22 | Disposition: A | Discharge status: Home / Self Care | Payer: Worker's Compensation | Source: Ambulatory Visit | Attending: Radiology | Admitting: Radiology

## 2011-07-22 ENCOUNTER — Encounter: Payer: Self-pay | Admitting: Emergency Medicine

## 2011-07-22 DIAGNOSIS — M549 Dorsalgia, unspecified: Secondary | ICD-10-CM

## 2011-07-30 ENCOUNTER — Telehealth: Payer: Self-pay | Admitting: Interventional Radiology

## 2011-07-30 NOTE — Telephone Encounter (Signed)
Returned patient's call/message asking for copy of MRI report "that showed (T12) fracture was acute, not from previous injury, for worker's comp."  Told him I have forwarded this request to [place] in Medical Records at this office [phone]) and that he can call her if he hasn't heard from her by this afternoon.  Patient's previous message also stated he is having "really tight muscle pain on the left after that (KP); deep aches without spasms."  Wanting to know if he should be on muscle relaxers.  Told patient that I spoke with Dr. Alfredo Batty (who had seen patient 07/22/11 in F/U to [facility] since Dr. Deanne Coffer out of town) and that he stated patient was released back to work after that visit as he was not having pain; he needs to F/U with either primary MD or WC for this new complaint.  Donell Sievert, RN

## 2011-08-11 ENCOUNTER — Inpatient Hospital Stay
Admission: RE | Admit: 2011-08-11 | Discharge: 2011-08-11 | Payer: Self-pay | Source: Ambulatory Visit | Attending: Interventional Radiology | Admitting: Interventional Radiology

## 2011-08-24 NOTE — ED Provider Notes (Signed)
Medical screening examination/treatment/procedure(s) were performed by non-physician practitioner and as supervising physician I was immediately available for consultation/collaboration.   Lyanne Co, MD 08/24/11 [phone]

## 2011-09-10 ENCOUNTER — Encounter (INDEPENDENT_AMBULATORY_CARE_PROVIDER_SITE_OTHER): Payer: Self-pay | Admitting: General Surgery

## 2011-09-11 ENCOUNTER — Encounter (INDEPENDENT_AMBULATORY_CARE_PROVIDER_SITE_OTHER): Payer: Self-pay | Admitting: Surgery

## 2011-09-11 ENCOUNTER — Ambulatory Visit (INDEPENDENT_AMBULATORY_CARE_PROVIDER_SITE_OTHER): Payer: PRIVATE HEALTH INSURANCE | Admitting: Surgery

## 2011-09-11 VITALS — BP 146/88 | HR 80 | Temp 98.6°F | Resp 20 | Ht 74.0 in | Wt 206.2 lb

## 2011-09-11 DIAGNOSIS — K4020 Bilateral inguinal hernia, without obstruction or gangrene, not specified as recurrent: Secondary | ICD-10-CM | POA: Insufficient documentation

## 2011-09-11 NOTE — Progress Notes (Signed)
Chief Complaint:  Symptomatic discomfort in left inguinal region with hernia  History of Present Illness:  Garrett Turner is an 61 y.o. male referred by Dr. Georganna Skeans with left inguinal hernia that has presented with pain and swelling in the left scrotum.  He has also been having some difficulty starting his stream.  He has recently undergone a T12 kyphoplasty by Dr. Lonia Skinner. 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 He has a history of infectious hepatitis many years ago. He does drink but he said he doesn't drink that much. CT scan further indicated that he had portal hypertension and some large varices. Before embarking on an elective inguinal hernia repair I wanted to further assess his cirrhosis as it could be related to an infectious process or could be related to steatohepatitis. I'm going to refer to [facility] to evaluate his cirrhosis and see him back afterwards to consider hernia repair.   Past Medical History  Diagnosis Date  . Diabetes mellitus   . Hypertension   . High cholesterol   . Anxiety disorder   . Pancytopenia   . Inguinal hernia     Left    Past Surgical History  Procedure Date  . Tonsillectomy     Current Outpatient Prescriptions  Medication Sig Dispense Refill  . aspirin EC 81 MG tablet Take 81 mg by mouth daily.       Marland Kitchen atorvastatin (LIPITOR) 10 MG tablet Take 10 mg by mouth daily.      . betamethasone dipropionate (DIPROLENE) 0.05 % cream Apply topically 2 (two) times daily.      . cyclobenzaprine (FLEXERIL) 10 MG tablet Take 10 mg by mouth 2 (two) times daily as needed.      Marland Kitchen HYDROcodone-acetaminophen (NORCO) 5-325 MG per tablet Take 1 tablet by mouth every 6 (six) hours as needed.      . hydrOXYzine (ATARAX/VISTARIL) 10 MG tablet Take 10 mg by mouth 3 (three) times daily as needed.      Marland Kitchen imipramine (TOFRANIL) 50 MG tablet Take 200 mg by  mouth at bedtime.        . metFORMIN (GLUCOPHAGE) 1000 MG tablet Take 1,000 mg by mouth 2 (two) times daily with a meal.      . naproxen (NAPROSYN) 500 MG tablet Take 500 mg by mouth 2 (two) times daily with a meal.      . simvastatin (ZOCOR) 20 MG tablet Take 1 tablet (20 mg total) by mouth daily at 6 PM.  30 tablet  1  . telmisartan-hydrochlorothiazide (MICARDIS HCT) 40-12.5 MG per tablet Take 1 tablet by mouth daily.       Review of patient's allergies indicates no known allergies. Family History  Problem Relation Age of Onset  . Coronary artery disease    . Hypertension    . Heart failure Father   . Hypertension Father   . Heart disease Father   . Heart failure Mother   . Hypertension Mother   . Heart disease Mother   . Stroke Paternal Grandmother   . Obesity Daughter    Social History:   reports that he has been smoking Cigarettes.  He has never used smokeless tobacco. He reports that he drinks about 1.1 ounces of alcohol per week. He reports that he does not use illicit drugs.   REVIEW OF SYSTEMS - PERTINENT POSITIVES ONLY: noncontributory  Physical Exam:  Blood pressure 146/88, pulse 80, temperature 98.6 F (37 C), temperature source Temporal, resp. rate 20, height 6\' 2"  (1.88 m), weight 206 lb 3.2 oz (93.532 kg). Body mass index is 26.47 kg/(m^2).  Gen:  WDWN white male NAD  Neurological: Alert and oriented to person, place, and time. Motor and sensory function is grossly intact  Head: Normocephalic and atraumatic.  Eyes: Conjunctivae are normal. Pupils are equal, round, and reactive to light. No scleral icterus.  Neck: Normal range of motion. Neck supple. No tracheal deviation or thyromegaly present.  Cardiovascular:  SR without murmurs or gallops.  No carotid bruits Respiratory: Effort normal.  No respiratory distress. No chest wall tenderness. Breath sounds normal.  No wheezes, rales or rhonchi.  Abdomen:  He has fullness in both inguinal regions were more on the  left but no obvious bulge today. He likely has indirect inguinal hernias at times containing fat and ascites. GU: Musculoskeletal: Normal range of motion. Extremities are nontender. No cyanosis, edema or clubbing noted Lymphadenopathy: No cervical, preauricular, postauricular or axillary adenopathy is present Skin: Skin is warm and dry. No rash noted. No diaphoresis. No erythema. No pallor. Pscyh: Normal mood and affect. Behavior is normal. Judgment and thought content normal.   LABORATORY RESULTS: No results found for this or any previous visit (from the past 48 hour(s)).  RADIOLOGY RESULTS: No results found.  Problem List: Patient Active Problem List  Diagnoses  . Intractable pain  . Fall  . Diabetes mellitus  . HTN (hypertension)  . Hyperlipidemia  . Cirrhosis  . UTI (lower urinary tract infection)  . Bilateral inguinal hernia-containing fat and ascites    Assessment & Plan: Plan hepatic cirrhosis by CT scan. We'll need to evaluate prior to performing elective surgery. We'll see back after he evaluated by gastroenterology.    Matt B. Daphine Deutscher, MD, [facility] [phone] beeper [phone]  09/11/2011 11:13 AM

## 2011-09-11 NOTE — Progress Notes (Signed)
Addended by: Latricia Heft on: 09/11/2011 11:38 AM   Modules accepted: Orders

## 2011-09-21 ENCOUNTER — Other Ambulatory Visit: Payer: Self-pay | Admitting: Gastroenterology

## 2011-09-21 DIAGNOSIS — K7460 Unspecified cirrhosis of liver: Secondary | ICD-10-CM

## 2011-09-22 ENCOUNTER — Ambulatory Visit (INDEPENDENT_AMBULATORY_CARE_PROVIDER_SITE_OTHER): Payer: Self-pay | Admitting: Surgery

## 2011-09-25 ENCOUNTER — Ambulatory Visit
Admission: RE | Admit: 2011-09-25 | Discharge: 2011-09-25 | Disposition: A | Discharge status: Home / Self Care | Payer: Worker's Compensation | Source: Ambulatory Visit | Attending: Gastroenterology | Admitting: Gastroenterology

## 2011-09-25 DIAGNOSIS — K7460 Unspecified cirrhosis of liver: Secondary | ICD-10-CM

## 2011-11-03 ENCOUNTER — Encounter (HOSPITAL_COMMUNITY): Payer: Self-pay | Admitting: *Deleted

## 2011-11-04 ENCOUNTER — Encounter (HOSPITAL_COMMUNITY): Payer: Self-pay | Admitting: Anesthesiology

## 2011-11-04 ENCOUNTER — Ambulatory Visit (HOSPITAL_COMMUNITY)
Admission: RE | Admit: 2011-11-04 | Discharge: 2011-11-04 | Disposition: A | Discharge status: Home / Self Care | Payer: Self-pay | Source: Ambulatory Visit | Attending: Gastroenterology | Admitting: Gastroenterology

## 2011-11-04 ENCOUNTER — Ambulatory Visit (HOSPITAL_COMMUNITY): Payer: Self-pay | Admitting: Anesthesiology

## 2011-11-04 ENCOUNTER — Encounter (HOSPITAL_COMMUNITY)
Admission: RE | Disposition: A | Discharge status: Home / Self Care | Payer: Self-pay | Source: Ambulatory Visit | Attending: Gastroenterology

## 2011-11-04 ENCOUNTER — Encounter (HOSPITAL_COMMUNITY): Payer: Self-pay | Admitting: *Deleted

## 2011-11-04 DIAGNOSIS — I10 Essential (primary) hypertension: Secondary | ICD-10-CM | POA: Insufficient documentation

## 2011-11-04 DIAGNOSIS — K2970 Gastritis, unspecified, without bleeding: Secondary | ICD-10-CM | POA: Insufficient documentation

## 2011-11-04 DIAGNOSIS — E7800 Pure hypercholesterolemia, unspecified: Secondary | ICD-10-CM | POA: Insufficient documentation

## 2011-11-04 DIAGNOSIS — I8500 Esophageal varices without bleeding: Secondary | ICD-10-CM | POA: Insufficient documentation

## 2011-11-04 DIAGNOSIS — Z1211 Encounter for screening for malignant neoplasm of colon: Secondary | ICD-10-CM | POA: Insufficient documentation

## 2011-11-04 DIAGNOSIS — K2990 Gastroduodenitis, unspecified, without bleeding: Secondary | ICD-10-CM | POA: Insufficient documentation

## 2011-11-04 DIAGNOSIS — Z7982 Long term (current) use of aspirin: Secondary | ICD-10-CM | POA: Insufficient documentation

## 2011-11-04 DIAGNOSIS — K7460 Unspecified cirrhosis of liver: Secondary | ICD-10-CM | POA: Insufficient documentation

## 2011-11-04 DIAGNOSIS — K319 Disease of stomach and duodenum, unspecified: Secondary | ICD-10-CM | POA: Insufficient documentation

## 2011-11-04 DIAGNOSIS — Z79899 Other long term (current) drug therapy: Secondary | ICD-10-CM | POA: Insufficient documentation

## 2011-11-04 DIAGNOSIS — E119 Type 2 diabetes mellitus without complications: Secondary | ICD-10-CM | POA: Insufficient documentation

## 2011-11-04 DIAGNOSIS — K5730 Diverticulosis of large intestine without perforation or abscess without bleeding: Secondary | ICD-10-CM | POA: Insufficient documentation

## 2011-11-04 DIAGNOSIS — D128 Benign neoplasm of rectum: Secondary | ICD-10-CM | POA: Insufficient documentation

## 2011-11-04 HISTORY — DX: Urinary tract infection, site not specified: N39.0

## 2011-11-04 HISTORY — PX: ESOPHAGOGASTRODUODENOSCOPY: SHX5428

## 2011-11-04 HISTORY — DX: Major depressive disorder, single episode, unspecified: F32.9

## 2011-11-04 HISTORY — DX: Depression, unspecified: F32.A

## 2011-11-04 HISTORY — DX: Anxiety disorder, unspecified: F41.9

## 2011-11-04 HISTORY — PX: COLONOSCOPY: SHX5424

## 2011-11-04 SURGERY — EGD (ESOPHAGOGASTRODUODENOSCOPY)
Anesthesia: Monitor Anesthesia Care

## 2011-11-04 MED ORDER — MIDAZOLAM HCL 5 MG/5ML IJ SOLN
INTRAMUSCULAR | Status: DC | PRN
  Administered 2011-11-04: 2 mg via INTRAVENOUS

## 2011-11-04 MED ORDER — FENTANYL CITRATE 0.05 MG/ML IJ SOLN
INTRAMUSCULAR | Status: DC | PRN
  Administered 2011-11-04: 50 ug via INTRAVENOUS

## 2011-11-04 MED ORDER — BUTAMBEN-TETRACAINE-BENZOCAINE 2-2-14 % EX AERO
INHALATION_SPRAY | CUTANEOUS | Status: DC | PRN
  Administered 2011-11-04: 2 via TOPICAL

## 2011-11-04 MED ORDER — LACTATED RINGERS IV SOLN
INTRAVENOUS | Status: DC
  Administered 2011-11-04: 1000 mL via INTRAVENOUS

## 2011-11-04 MED ORDER — PROPOFOL 10 MG/ML IV EMUL
INTRAVENOUS | Status: DC | PRN
  Administered 2011-11-04: 160 ug/kg/min via INTRAVENOUS

## 2011-11-04 MED ORDER — LACTATED RINGERS IV SOLN
INTRAVENOUS | Status: DC | PRN
  Administered 2011-11-04: 10:00:00 via INTRAVENOUS

## 2011-11-04 NOTE — Discharge Instructions (Addendum)
Endoscopy   Post Procedure Instructions  Please read the instructions outlined below and refer to this sheet in the next few weeks. These discharge instructions provide you with general information on caring for yourself after you leave the hospital. Your doctor may also give you specific instructions. While your treatment has been planned according to the most current medical practices available, unavoidable complications occasionally occur. If you have any problems or questions after discharge, please call Dr. Dulce Sellar [facility]) at [phone].  HOME CARE INSTRUCTIONS Activity  You may resume your regular activity but move at a slower pace for the next 24 hours.   Take frequent rest periods for the next 24 hours.   Walking will help expel (get rid of) the air and reduce the bloated feeling in your abdomen.   No driving for 24 hours (because of the anesthesia (medicine) used during the test).   You may shower.   Do not sign any important legal documents or operate any machinery for 24 hours (because of the anesthesia used during the test).  Nutrition  Drink plenty of fluids.   You may resume your normal diet.   Begin with a light meal and progress to your normal diet.   Avoid alcoholic beverages for 24 hours or as instructed by your caregiver.  Medications You may resume your normal medications unless your caregiver tells you otherwise. What you can expect today  You may experience abdominal discomfort such as a feeling of fullness or "gas" pains.   You may experience a sore throat for 2 to 3 days. This is normal. Gargling with salt water may help this.    SEEK IMMEDIATE MEDICAL CARE IF:  You have excessive nausea (feeling sick to your stomach) and/or vomiting.   You have severe abdominal pain and distention (swelling).   You have trouble swallowing.   You have a temperature over 100 F (37.8 C).   You have rectal bleeding or vomiting of blood.    Document Released: 02/18/2004 Document Revised: 03/18/2011 Document Reviewed: 08/31/2007 [facility] Patient Information 2012 [place], [place].  Colonoscopy  Post procedure instructions:  Read the instructions outlined below and refer to this sheet in the next few weeks. These discharge instructions provide you with general information on caring for yourself after you leave the hospital. Your doctor may also give you specific instructions. While your treatment has been planned according to the most current medical practices available, unavoidable complications occasionally occur. If you have any problems or questions after discharge, call Dr. Dulce Sellar at [facility] [phone]).  HOME CARE INSTRUCTIONS  ACTIVITY:  You may resume your regular activity, but move at a slower pace for the next 24 hours.   Take frequent rest periods for the next 24 hours.   Walking will help get rid of the air and reduce the bloated feeling in your belly (abdomen).   No driving for 24 hours (because of the medicine (anesthesia) used during the test).   You may shower.   Do not sign any important legal documents or operate any machinery for 24 hours (because of the anesthesia used during the test).  NUTRITION:  Drink plenty of fluids.   You may resume your normal diet as instructed by your doctor.   Begin with a light meal and progress to your normal diet. Heavy or fried foods are harder to digest and may make you feel sick to your stomach (nauseated).   Avoid alcoholic beverages for 24 hours or as instructed.  MEDICATIONS:  You may resume your normal medications unless your doctor tells you otherwise.  WHAT TO EXPECT TODAY:  Some feelings of bloating in the abdomen.   Passage of more gas than usual.   Spotting of blood in your stool or on the toilet paper.  IF YOU HAD POLYPS REMOVED DURING THE COLONOSCOPY:  No aspirin products for 7 days or as instructed.   No alcohol for 7 days or as  instructed.   Eat a soft diet for the next 24 hours.   FINDING OUT THE RESULTS OF YOUR TEST  Not all test results are available during your visit. If your test results are not back during the visit, make an appointment with your caregiver to find out the results. Do not assume everything is normal if you have not heard from your caregiver or the medical facility. It is important for you to follow up on all of your test results.     SEEK IMMEDIATE MEDICAL CARE IF:   You have more than a spotting of blood in your stool.   Your belly is swollen (abdominal distention).   You are nauseated or vomiting.   You have a fever.   You have abdominal pain or discomfort that is severe or gets worse throughout the day.

## 2011-11-04 NOTE — Op Note (Signed)
[facility] [address] [place], [place]  [address]  COLONOSCOPY PROCEDURE REPORT  PATIENT:  Garrett Turner, Garrett Turner  MR#:  [id number] BIRTHDATE:  Jul 04, 1951, 60 yrs. old  GENDER:  male ENDOSCOPIST:  Willis Modena, MD REF. BY:  Georganna Skeans, MD Luretha Murphy, MD PROCEDURE DATE:  11/04/2011 PROCEDURE:  Colonoscopy with biopsy ASA CLASS:  Class III INDICATIONS:  average-risk colon cancer screening MEDICATIONS:   MAC sedation, administered by CRNA  DESCRIPTION OF PROCEDURE:   After the risks benefits and alternatives of the procedure were thoroughly explained, informed consent was obtained.  Digital rectal exam was performed and revealed no abnormalities.   The EC-3890Li ([id number]) and EC-[id number] ([id number]) endoscope was introduced through the anus and advanced to the terminal ileum which was intubated for a short distance, without limitations.  The quality of the prep was good..  The instrument was then slowly withdrawn as the colon was fully examined. <<PROCEDUREIMAGES>>  FINDINGS:  Digital rectal exam normal.  Prep quality was good. Few sigmoid diverticula.  Couple diminutive hyperplastic-appearing rectosigmoid polyps, removed with cold biopsy  forceps.  No other polyps, masses, vascular ectasias, or inflammatory changes were seen.  Normal distal 5cm of terminal ileum. Normal retroflexed view of rectum.  ENDOSCOPIC IMPRESSION:    1.  Couple small rectosigmoid polyps, removed. 2.  Sigmoid diverticulosis. 3.  Otherwise normal colonoscopy  to terminal ileum.  RECOMMENDATIONS:      1.  Watch for potential complications of procedure. 2.  Await biopsy  results. 3.  Repeat colonoscopy 5-10 years, pending biopsy results. 4.  Follow-up with [place] in 6-8 weeks. 5.  Will discuss findings with Dr. Daphine Deutscher, pre-operatively of patient' anticipated hernia repair.  REPEAT EXAM:  Yes, 5-10 years, pending pathology results.  ______________________________ Willis Modena  CC:  n. eSIGNEDWillis Modena at 11/04/2011 10:34 AM  Scarlette Calico, [phone]

## 2011-11-04 NOTE — Anesthesia Preprocedure Evaluation (Addendum)
Anesthesia Evaluation  Patient identified by MRN, date of birth, ID band Patient awake    Reviewed: Allergy & Precautions, H&P , NPO status , Patient's Chart, lab work & pertinent test results  Airway Mallampati: II TM Distance: >3 FB Neck ROM: Full    Dental  (+) Loose Loose lower front tooth.:   Pulmonary neg pulmonary ROS,  breath sounds clear to auscultation  Pulmonary exam normal       Cardiovascular hypertension, Rhythm:Regular Rate:Normal     Neuro/Psych PSYCHIATRIC DISORDERS Anxiety Depression Workup for stroke 11/12. Weak on the right side; now resolved. Echo OK. TIA   GI/Hepatic negative GI ROS, (+) Cirrhosis -       ,   Endo/Other  Diabetes mellitus-, Type 2, Oral Hypoglycemic Agents  Renal/GU negative Renal ROS  negative genitourinary   Musculoskeletal negative musculoskeletal ROS (+)   Abdominal   Peds negative pediatric ROS (+)  Hematology negative hematology ROS (+)   Anesthesia Other Findings   Reproductive/Obstetrics negative OB ROS                         Anesthesia Physical Anesthesia Plan  ASA: III  Anesthesia Plan: MAC   Post-op Pain Management:    Induction: Intravenous  Airway Management Planned: Nasal Cannula  Additional Equipment:   Intra-op Plan:   Post-operative Plan: Extubation in OR  Informed Consent: I have reviewed the patients History and Physical, chart, labs and discussed the procedure including the risks, benefits and alternatives for the proposed anesthesia with the patient or authorized representative who has indicated his/her understanding and acceptance.   Dental advisory given  Plan Discussed with: CRNA  Anesthesia Plan Comments:        Anesthesia Quick Evaluation

## 2011-11-04 NOTE — Op Note (Signed)
[facility] [address] [place], [place]  [address]  ENDOSCOPY PROCEDURE REPORT  PATIENT:  Garrett, Turner  MR#:  [id number] BIRTHDATE:  [birth year], 60 yrs. old  GENDER:  male  ENDOSCOPIST:  Willis Modena, MD Referred by:  Georganna Skeans, MD Luretha Murphy, MD  PROCEDURE DATE:  11/04/2011 PROCEDURE:  EGD, diagnostic 19147 ASA CLASS:  Class III INDICATIONS:  cirrhosis, gastroesophageal variceal screening MEDICATIONS:   MAC sedation, administered by CRNA, Cetacaine spray x 2  DESCRIPTION OF PROCEDURE:   After the risks benefits and alternatives of the procedure were thoroughly explained, informed consent was obtained.  The  endoscope was introduced through the mouth and advanced to the second portion of the duodenum, without limitations.  The instrument was slowly withdrawn as the mucosa was fully examined. <<PROCEDUREIMAGES>> FINDINGS:  Three columns of Grade II/III distal esophageal varices were identified without stigmata of hemorrhage; proximal and mid esophagus normal.  Moderate portal gastropathy, worse in cardia, fundus and body.  Antropyloric gastritis with erosions also seen. Otherwise normal stomach; retroflexion into cardia revealed prominent rugae (appearance not typical of gastric varices). Normal pylorus and duodenum to the second portion.  ENDOSCOPIC IMPRESSION:    1.  Distal esophageal varices, non-bleeding. 2.  Portal gastropathy.  RECOMMENDATIONS:      1.  Watch for potential complications of procedure. 2.  Start propranolol 10 mg bid as HR/BP tolerates. 3.  Screening colonoscopy today.  ______________________________ Willis Modena  CC:  n. eSIGNEDWillis Modena at 11/04/2011 10:26 AM  Scarlette Calico, [phone]

## 2011-11-04 NOTE — Anesthesia Postprocedure Evaluation (Signed)
  Anesthesia Post-op Note  Patient: Garrett Turner  Procedure(s) Performed: Procedure(s) (LRB): ESOPHAGOGASTRODUODENOSCOPY (EGD) (N/A) COLONOSCOPY (N/A)  Patient Location: PACU  Anesthesia Type: MAC  Level of Consciousness: awake and alert   Airway and Oxygen Therapy: Patient Spontanous Breathing  Post-op Pain: mild  Post-op Assessment: Post-op Vital signs reviewed, Patient's Cardiovascular Status Stable, Respiratory Function Stable, Patent Airway and No signs of Nausea or vomiting  Post-op Vital Signs: stable  Complications: No apparent anesthesia complications

## 2011-11-04 NOTE — H&P (Signed)
Patient interval history reviewed.  Patient examined again.  There has been no change from documented H/P dated 10/15/11 (scanned into chart from our office) except as documented above.  Assessment:  Cirrhosis, suspect non-alcoholic fatty liver disease.                         Average-risk colon cancer screening.  Plan:  Upper endoscopy for gastroesophageal variceal screening.           Screening colonoscopy.  Risks (bleeding, infection, bowel perforation that could require surgery, sedation-related changes in cardiopulmonary systems), benefits (identification and possible treatment of source of symptoms, exclusion of certain causes of symptoms), and alternatives (watchful waiting, radiographic imaging studies, empiric medical treatment) of upper endoscopy and colonoscopy (EGD + Colon) were explained to patient in detail and he wishes to proceed.

## 2011-11-04 NOTE — Transfer of Care (Signed)
Immediate Anesthesia Transfer of Care Note  Patient: Garrett Turner  Procedure(s) Performed: Procedure(s) (LRB): ESOPHAGOGASTRODUODENOSCOPY (EGD) (N/A) COLONOSCOPY (N/A)  Patient Location: PACU  Anesthesia Type: MAC  Level of Consciousness: sedated and patient cooperative  Airway & Oxygen Therapy: Patient Spontanous Breathing and Patient connected to nasal cannula oxygen  Post-op Assessment: Report given to PACU RN and Post -op Vital signs reviewed and stable  Post vital signs: Reviewed and stable  Complications: No apparent anesthesia complications

## 2011-11-05 ENCOUNTER — Encounter (HOSPITAL_COMMUNITY): Payer: Self-pay | Admitting: Gastroenterology

## 2012-04-04 ENCOUNTER — Encounter (HOSPITAL_COMMUNITY): Payer: Self-pay

## 2012-04-04 ENCOUNTER — Emergency Department (HOSPITAL_COMMUNITY)
Admission: EM | Admit: 2012-04-04 | Discharge: 2012-04-04 | Disposition: A | Discharge status: Home / Self Care | Payer: Self-pay | Source: Home / Self Care

## 2012-04-04 DIAGNOSIS — S0990XA Unspecified injury of head, initial encounter: Secondary | ICD-10-CM

## 2012-04-04 DIAGNOSIS — S0101XA Laceration without foreign body of scalp, initial encounter: Secondary | ICD-10-CM

## 2012-04-04 DIAGNOSIS — S0100XA Unspecified open wound of scalp, initial encounter: Secondary | ICD-10-CM

## 2012-04-04 DIAGNOSIS — S8012XA Contusion of left lower leg, initial encounter: Secondary | ICD-10-CM

## 2012-04-04 MED ORDER — TETANUS-DIPHTHERIA TOXOIDS TD 5-2 LFU IM INJ
0.5000 mL | INJECTION | Freq: Once | INTRAMUSCULAR | Status: AC
  Administered 2012-04-04: 0.5 mL via INTRAMUSCULAR

## 2012-04-04 MED ORDER — TETANUS-DIPHTHERIA TOXOIDS TD 5-2 LFU IM INJ: 0.5000 mL | INJECTION | Freq: Once | INTRAMUSCULAR | Status: DC

## 2012-04-04 MED ORDER — TETANUS-DIPHTHERIA TOXOIDS TD 5-2 LFU IM INJ
INJECTION | INTRAMUSCULAR | Status: AC
  Filled 2012-04-04: qty 0.5

## 2012-04-04 NOTE — ED Provider Notes (Signed)
History     CSN: [id number]  Arrival date & time 04/04/12  1931   None     Chief Complaint  Patient presents with  . Fall    (Consider location/radiation/quality/duration/timing/severity/associated sxs/prior treatment) HPI Comments: Experienced a mechanical fall at home around [employer]. Struck forehead on a tile floor and received a laceration to the forehead. No LOC, no confusion, disorientation or other neurological symptoms.  He also bumped his L lower leg above the ankle where there is a small tumescence representing a hematoma. No bony tenderness or open skin areas.  Fully alert, speaking lucidly and conversing appropriately.    Past Medical History  Diagnosis Date  . Diabetes mellitus   . Hypertension   . High cholesterol   . Anxiety disorder   . Pancytopenia   . Inguinal hernia     Left  . Cirrhosis   . UTI (lower urinary tract infection)   . Depression   . Anxiety     Past Surgical History  Procedure Date  . Tonsillectomy   . Esophagogastroduodenoscopy 11/04/2011    Procedure: ESOPHAGOGASTRODUODENOSCOPY (EGD);  Surgeon: Willis Modena, MD;  Location: [facility];  Service: Endoscopy;  Laterality: N/A;  . Colonoscopy 11/04/2011    Procedure: COLONOSCOPY;  Surgeon: Willis Modena, MD;  Location: [facility];  Service: Endoscopy;  Laterality: N/A;    Family History  Problem Relation Age of Onset  . Coronary artery disease    . Hypertension    . Heart failure Father   . Hypertension Father   . Heart disease Father   . Heart failure Mother   . Hypertension Mother   . Heart disease Mother   . Stroke Paternal Grandmother   . Obesity Daughter     History  Substance Use Topics  . Smoking status: Never Smoker   . Smokeless tobacco: Former Neurosurgeon  . Alcohol Use: 1.1 oz/week    1 Glasses of wine, 1 Drinks containing 0.5 oz of alcohol per week      Review of Systems  Constitutional: Negative for chills, activity change and fatigue.  HENT: Negative for  hearing loss, ear pain, nosebleeds, facial swelling, neck pain, neck stiffness and ear discharge.   Eyes: Negative.   Cardiovascular: Negative.   Gastrointestinal: Negative for nausea and vomiting.  Musculoskeletal: Negative for back pain and joint swelling.  Skin: Positive for wound. Negative for rash.  Neurological: Negative for dizziness, syncope, facial asymmetry, speech difficulty, weakness, light-headedness, numbness and headaches.    Allergies  Review of patient's allergies indicates no known allergies.  Home Medications   Current Outpatient Rx  Name Route Sig Dispense Refill  . ATORVASTATIN CALCIUM 10 MG PO TABS Oral Take 10 mg by mouth daily.    Marland Kitchen BETAMETHASONE DIPROPIONATE 0.05 % EX CREA Topical Apply topically 2 (two) times daily.    . CYCLOBENZAPRINE HCL 10 MG PO TABS Oral Take 10 mg by mouth 2 (two) times daily as needed.    Marland Kitchen GLIMEPIRIDE 4 MG PO TABS Oral Take 4 mg by mouth daily before breakfast.    . HYDROCODONE-ACETAMINOPHEN 5-325 MG PO TABS Oral Take 1 tablet by mouth every 6 (six) hours as needed.    Marland Kitchen HYDROXYZINE HCL 10 MG PO TABS Oral Take 10 mg by mouth 3 (three) times daily as needed.    . IMIPRAMINE HCL 50 MG PO TABS Oral Take 200 mg by mouth at bedtime.      Marland Kitchen METFORMIN HCL 1000 MG PO TABS Oral Take 1,000 mg  by mouth 2 (two) times daily with a meal.    . SIMVASTATIN 20 MG PO TABS Oral Take 1 tablet (20 mg total) by mouth daily at 6 PM. 30 tablet 1  . TELMISARTAN-HCTZ 40-12.5 MG PO TABS Oral Take 1 tablet by mouth daily.      BP 129/74  Pulse 84  Temp 98.5 F (36.9 C) (Oral)  Resp 16  SpO2 99%  Physical Exam  Constitutional: He is oriented to person, place, and time. He appears well-developed and well-nourished. No distress.  HENT:  Head: Normocephalic.  Nose: Nose normal.       2,5cm laceration to the L forehead, superficial and bleeding controlled.   Eyes: EOM are normal. Pupils are equal, round, and reactive to light. Left eye exhibits no discharge.   Neck: Normal range of motion. Neck supple.  Pulmonary/Chest: Effort normal. No respiratory distress.  Musculoskeletal: Normal range of motion. He exhibits tenderness. He exhibits no edema.       Small tumescence of L lower leg above the ankle. Tender, no bony tenderness or deformity. Full weight bearing and ambulatory without pain or discomfort.   Neurological: He is alert and oriented to person, place, and time. No cranial nerve deficit.  Skin: Skin is warm and dry.    ED Course  LACERATION REPAIR Date/Time: 04/04/2012 9:30 PM Performed by: Phineas Real, Dryden Tapley Authorized by: Phineas Real, Robley Matassa Consent: Verbal consent obtained. Risks and benefits: risks, benefits and alternatives were discussed Consent given by: patient Patient identity confirmed: verbally with patient Body area: head/neck Location details: forehead Laceration length: 2.5 cm Foreign bodies: no foreign bodies Tendon involvement: none Nerve involvement: none Vascular damage: no Anesthesia: local infiltration Local anesthetic: lidocaine 2% with epinephrine Anesthetic total: 2.3 ml Patient sedated: no Preparation: Patient was prepped and draped in the usual sterile fashion. Irrigation solution: saline Irrigation method: syringe Amount of cleaning: standard Debridement: none Degree of undermining: none Skin closure: 5-0 nylon Number of sutures: 5 Technique: simple Approximation: close Approximation difficulty: simple Dressing: 4x4 sterile gauze Patient tolerance: Patient tolerated the procedure well with no immediate complications.   (including critical care time)  Labs Reviewed - No data to display No results found.   1. Laceration of scalp   2. Head injury   3. Contusion of leg, left       MDM  Sutures out 5 d Ice over leg contusion.         Hayden Rasmussen, NP 04/04/12 2133

## 2012-04-04 NOTE — ED Notes (Signed)
Was asked to evaluate this pt on arrival. States he tripped over furniture and hit his head on floor just PTA, and has pain and swelling left lower leg (unsure why his leg is sore ) Denies LOC, incontinence , "fuzzy headedness" ; alert oriented ,MAEW, is not on blood thinners. . Was advised we will be happy to see him for his problem

## 2012-04-04 NOTE — ED Notes (Signed)
States he tripped over furniture just PTA, and struck his right frontal area on floor; denies LOC or soiling his pants; no blood thinners; pain lower leg, unsure if he may have struck on furniture

## 2012-04-05 NOTE — ED Provider Notes (Signed)
Medical screening examination/treatment/procedure(s) were performed by resident physician or non-physician practitioner and as supervising physician I was immediately available for consultation/collaboration.   Barkley Bruns MD.    Linna Hoff, MD 04/05/12 [phone]

## 2012-04-11 ENCOUNTER — Emergency Department (HOSPITAL_COMMUNITY): Payer: Self-pay

## 2012-04-11 ENCOUNTER — Emergency Department (HOSPITAL_COMMUNITY)
Admission: EM | Admit: 2012-04-11 | Discharge: 2012-04-12 | Disposition: A | Discharge status: Home / Self Care | Payer: Self-pay | Attending: Emergency Medicine | Admitting: Emergency Medicine

## 2012-04-11 ENCOUNTER — Encounter (HOSPITAL_COMMUNITY): Payer: Self-pay | Admitting: *Deleted

## 2012-04-11 DIAGNOSIS — F3289 Other specified depressive episodes: Secondary | ICD-10-CM | POA: Insufficient documentation

## 2012-04-11 DIAGNOSIS — F329 Major depressive disorder, single episode, unspecified: Secondary | ICD-10-CM | POA: Insufficient documentation

## 2012-04-11 DIAGNOSIS — E119 Type 2 diabetes mellitus without complications: Secondary | ICD-10-CM | POA: Insufficient documentation

## 2012-04-11 DIAGNOSIS — L02419 Cutaneous abscess of limb, unspecified: Secondary | ICD-10-CM | POA: Insufficient documentation

## 2012-04-11 DIAGNOSIS — L0390 Cellulitis, unspecified: Secondary | ICD-10-CM

## 2012-04-11 DIAGNOSIS — W1809XA Striking against other object with subsequent fall, initial encounter: Secondary | ICD-10-CM | POA: Insufficient documentation

## 2012-04-11 DIAGNOSIS — S90129A Contusion of unspecified lesser toe(s) without damage to nail, initial encounter: Secondary | ICD-10-CM | POA: Insufficient documentation

## 2012-04-11 DIAGNOSIS — F411 Generalized anxiety disorder: Secondary | ICD-10-CM | POA: Insufficient documentation

## 2012-04-11 DIAGNOSIS — I10 Essential (primary) hypertension: Secondary | ICD-10-CM | POA: Insufficient documentation

## 2012-04-11 DIAGNOSIS — E7800 Pure hypercholesterolemia, unspecified: Secondary | ICD-10-CM | POA: Insufficient documentation

## 2012-04-11 LAB — CBC WITH DIFFERENTIAL/PLATELET
Basophils Relative: 1 % (ref 0–1)
Eosinophils Absolute: 0.1 10*3/uL (ref 0.0–0.7)
Eosinophils Relative: 3 % (ref 0–5)
HCT: 36.3 % — ABNORMAL LOW (ref 39.0–52.0)
Hemoglobin: 12.8 g/dL — ABNORMAL LOW (ref 13.0–17.0)
Lymphocytes Relative: 21 % (ref 12–46)
MCHC: 35.3 g/dL (ref 30.0–36.0)
Neutro Abs: 3.2 10*3/uL (ref 1.7–7.7)
Neutrophils Relative %: 64 % (ref 43–77)
RBC: 4.1 MIL/uL — ABNORMAL LOW (ref 4.22–5.81)

## 2012-04-11 LAB — COMPREHENSIVE METABOLIC PANEL
ALT: 30 U/L (ref 0–53)
Alkaline Phosphatase: 110 U/L (ref 39–117)
BUN: 12 mg/dL (ref 6–23)
CO2: 23 mEq/L (ref 19–32)
Chloride: 97 mEq/L (ref 96–112)
GFR calc Af Amer: >90 mL/min (ref >90)
GFR calc non Af Amer: >90 mL/min (ref >90)
Glucose, Bld: 342 mg/dL — ABNORMAL HIGH (ref 70–99)
Potassium: 3.8 mEq/L (ref 3.5–5.1)
Sodium: 131 mEq/L — ABNORMAL LOW (ref 135–145)
Total Bilirubin: 1.9 mg/dL — ABNORMAL HIGH (ref 0.3–1.2)

## 2012-04-11 NOTE — ED Notes (Signed)
Pt c/o left ankle pain, and swelling. Pt presents with redness, swelling, tender to palpation. Pt has +distal pulses, <2 sec cap refill. Pt reports falling a week ago on Sunday. Pt was seen at [facility], no x-ray done. Pt is A&Ox4, skin warm and dry, respirations equal and unlabored.

## 2012-04-12 ENCOUNTER — Emergency Department (HOSPITAL_COMMUNITY): Payer: Self-pay

## 2012-04-12 LAB — GLUCOSE, CAPILLARY: Glucose-Capillary: 209 mg/dL — ABNORMAL HIGH (ref 70–99)

## 2012-04-12 MED ORDER — HYDROCODONE-ACETAMINOPHEN 5-325 MG PO TABS: 1.0000 | ORAL_TABLET | ORAL | Status: DC | PRN

## 2012-04-12 MED ORDER — CLINDAMYCIN HCL 300 MG PO CAPS: 300.0000 mg | ORAL_CAPSULE | Freq: Four times a day (QID) | ORAL | Status: DC

## 2012-04-12 MED ORDER — SODIUM CHLORIDE 0.9 % IV BOLUS (SEPSIS)
1000.0000 mL | Freq: Once | INTRAVENOUS | Status: AC
  Administered 2012-04-12: 1000 mL via INTRAVENOUS

## 2012-04-12 MED ORDER — CLINDAMYCIN PHOSPHATE 900 MG/50ML IV SOLN
900.0000 mg | Freq: Once | INTRAVENOUS | Status: AC
  Administered 2012-04-12: 900 mg via INTRAVENOUS
  Filled 2012-04-12: qty 50

## 2012-04-12 NOTE — ED Provider Notes (Signed)
History     CSN: [id number]  Arrival date & time 04/11/12  1811   First MD Initiated Contact with Patient 04/12/12 0158      Chief Complaint  Patient presents with  . Ankle Pain   HPI  History provided by the patient. Patient is a 61 year old male with history of hypertension, hypercholesterolemia, diabetes, psoriasis who presents with complaints of persistent left ankle and foot pain after injury. Patient states he was visiting his wife in a nursing facility one week ago when he slammed his foot into a bedside table also causing him to trip and fall. Patient was seen and evaluated at a local urgent care Center and did sustain a small laceration to his for head. Patient has sutures placed. Patient was told to rest and ice his ankle but did not have any x-rays performed. He was told pain and swelling should improve patient reports pain is only increased. Patient also reports increased warmth and redness to the skin over the area. Denies any numbness or weakness to the foot. Patient has continued to be ambulatory. He denies any chest pain or shortness of breath symptoms. Patient denies any previous history of DVT or PE.    Past Medical History  Diagnosis Date  . Diabetes mellitus   . Hypertension   . High cholesterol   . Anxiety disorder   . Pancytopenia   . Inguinal hernia     Left  . Cirrhosis   . UTI (lower urinary tract infection)   . Depression   . Anxiety     Past Surgical History  Procedure Date  . Tonsillectomy   . Esophagogastroduodenoscopy 11/04/2011    Procedure: ESOPHAGOGASTRODUODENOSCOPY (EGD);  Surgeon: Willis Modena, MD;  Location: [facility];  Service: Endoscopy;  Laterality: N/A;  . Colonoscopy 11/04/2011    Procedure: COLONOSCOPY;  Surgeon: Willis Modena, MD;  Location: [facility];  Service: Endoscopy;  Laterality: N/A;    Family History  Problem Relation Age of Onset  . Coronary artery disease    . Hypertension    . Heart failure Father   .  Hypertension Father   . Heart disease Father   . Heart failure Mother   . Hypertension Mother   . Heart disease Mother   . Stroke Paternal Grandmother   . Obesity Daughter     History  Substance Use Topics  . Smoking status: Never Smoker   . Smokeless tobacco: Former Neurosurgeon  . Alcohol Use: 1.1 oz/week    1 Glasses of wine, 1 Drinks containing 0.5 oz of alcohol per week      Review of Systems  Respiratory: Negative for shortness of breath.   Cardiovascular: Negative for chest pain and palpitations.  Musculoskeletal: Positive for joint swelling.  Neurological: Negative for syncope and light-headedness.    Allergies  Review of patient's allergies indicates no known allergies.  Home Medications   Current Outpatient Rx  Name Route Sig Dispense Refill  . ATORVASTATIN CALCIUM 10 MG PO TABS Oral Take 10 mg by mouth daily.    Marland Kitchen BETAMETHASONE DIPROPIONATE 0.05 % EX CREA Topical Apply topically 2 (two) times daily.    Marland Kitchen GLIMEPIRIDE 4 MG PO TABS Oral Take 4 mg by mouth daily before breakfast.    . HYDROXYZINE HCL 10 MG PO TABS Oral Take 10 mg by mouth 3 (three) times daily as needed.    . IMIPRAMINE HCL 50 MG PO TABS Oral Take 200 mg by mouth at bedtime.      Marland Kitchen  METFORMIN HCL 1000 MG PO TABS Oral Take 1,000 mg by mouth 2 (two) times daily with a meal.    . TELMISARTAN-HCTZ 40-12.5 MG PO TABS Oral Take 1 tablet by mouth daily.      BP 141/82  Pulse 67  Temp 98.1 F (36.7 C) (Oral)  Resp 18  SpO2 100%  Physical Exam  Nursing note and vitals reviewed. Constitutional: He appears well-developed and well-nourished. No distress.  HENT:  Head: Normocephalic.  Cardiovascular: Normal rate and regular rhythm.   Pulmonary/Chest: Effort normal and breath sounds normal. No respiratory distress. He has no wheezes.  Abdominal: Soft.  Musculoskeletal: He exhibits edema and tenderness.       Tender nodule to the lateral aspect of left ankle with erythema of skin and increased warmth. There  is also generalized ecchymosis to the bottom of the foot and around the left third toe. No gross deformities. Normal dorsal pedal pulses. Patient reports normal sensation to light touch in foot and toes.  Neurological: He is alert.  Skin: Skin is warm.       Some psoriatic patches on skin.  Psychiatric: He has a normal mood and affect. His behavior is normal.    ED Course  Procedures   INCISION AND DRAINAGE Performed by: Angus Seller Consent: Verbal consent obtained. Risks and benefits: risks, benefits and alternatives were discussed Type: abscess  Body area: Left lateral ankle  Anesthesia: local infiltration  Local anesthetic: lidocaine 2% without epinephrine  Anesthetic total: 4 ml  Complexity: Simple  Drainage: Blood   Drainage amount: Small   Packing material: 1/4 in iodoform gauze  Patient tolerance: Patient tolerated the procedure well with no immediate complications.       Results for orders placed during the hospital encounter of 04/11/12  CBC WITH DIFFERENTIAL      Component Value Range   WBC 4.8  4.0 - 10.5 K/uL   RBC 4.10 (*) 4.22 - 5.81 MIL/uL   Hemoglobin 12.8 (*) 13.0 - 17.0 g/dL   HCT 16.1 (*) 09.6 - 04.5 %   MCV 88.5  78.0 - 100.0 fL   MCH 31.2  26.0 - 34.0 pg   MCHC 35.3  30.0 - 36.0 g/dL   RDW 40.9  81.1 - 91.4 %   Platelets    150 - 400 K/uL   Value: PLATELET CLUMPS NOTED ON SMEAR, COUNT APPEARS DECREASED   Neutrophils Relative 64  43 - 77 %   Lymphocytes Relative 21  12 - 46 %   Monocytes Relative 11  3 - 12 %   Eosinophils Relative 3  0 - 5 %   Basophils Relative 1  0 - 1 %   Neutro Abs 3.2  1.7 - 7.7 K/uL   Lymphs Abs 1.0  0.7 - 4.0 K/uL   Monocytes Absolute 0.5  0.1 - 1.0 K/uL   Eosinophils Absolute 0.1  0.0 - 0.7 K/uL   Basophils Absolute 0.0  0.0 - 0.1 K/uL   Smear Review MORPHOLOGY UNREMARKABLE    COMPREHENSIVE METABOLIC PANEL      Component Value Range   Sodium 131 (*) 135 - 145 mEq/L   Potassium 3.8  3.5 - 5.1 mEq/L    Chloride 97  96 - 112 mEq/L   CO2 23  19 - 32 mEq/L   Glucose, Bld 342 (*) 70 - 99 mg/dL   BUN 12  6 - 23 mg/dL   Creatinine, Ser 7.82  0.50 - 1.35 mg/dL   Calcium 9.0  8.4 - 10.5 mg/dL   Total Protein 7.5  6.0 - 8.3 g/dL   Albumin 3.5  3.5 - 5.2 g/dL   AST 44 (*) 0 - 37 U/L   ALT 30  0 - 53 U/L   Alkaline Phosphatase 110  39 - 117 U/L   Total Bilirubin 1.9 (*) 0.3 - 1.2 mg/dL   GFR calc non Af Amer >90  >90 mL/min   GFR calc Af Amer >90  >90 mL/min      Dg Ankle Complete Left  04/11/2012  *RADIOLOGY REPORT*  Clinical Data: Fall 1 week ago.  Swelling and bruising of the right ankle.  LEFT ANKLE COMPLETE - 3+ VIEW  Comparison: 01/20/2009.  Findings: The ankle mortise is congruent.  The talar dome is intact.  The alignment anatomic.  Soft tissue swelling is present in the distal leg and ankle.  There appears be a large os peroneum inferior to the anterior calcaneus on the lateral view.  Diabetic type small vessel atherosclerotic calcification present in the posterior ankle.  Subcutaneous edema is present in the distal leg.  IMPRESSION: No acute osseous injury.  Stable appearance of the ankle compared to prior.   Original Report Authenticated By: Andreas Newport, M.D.    Dg Foot Complete Left  04/12/2012  *RADIOLOGY REPORT*  Clinical Data: Foot pain.  Foot trauma.  LEFT FOOT - COMPLETE 3+ VIEW  Comparison: 04/11/2012.  01/20/2009.  Findings: No displaced fracture is identified.  There is flexion of the third through fifth toes.  Mild first MTP joint osteoarthritis is present.  Prominent os peroneus is present.  Metatarsals appear intact. Suboptimal visualization of the phalanges of the small toe.  IMPRESSION: No acute osseous abnormality.  No displaced fracture identified.   Original Report Authenticated By: Andreas Newport, M.D.      1. Cellulitis       MDM  Patient seen and evaluated. Patient with swelling and erythema to the left lateral ankle area as well as bruising to left third  toe.  Patient with elevated blood sugar. IV fluids ordered.   Patient discussed with attending physician. Bedside ultrasound shows superficial fluctuant pocket. Will perform I&D.  I&D performed without significant purulent drainage. There was some small amount of blood with small clots removed.  Patient was offered admission for cellulitis and continued observation of left ankle and leg. He is not wish to stay in the hospital. Patient was given a dose of clindamycin as well as prescriptions with strict instructions for 24-48 hour recheck.  Angus Seller, [place] 04/12/12 2213

## 2012-04-13 ENCOUNTER — Encounter (HOSPITAL_COMMUNITY): Payer: Self-pay | Admitting: Emergency Medicine

## 2012-04-13 ENCOUNTER — Inpatient Hospital Stay (HOSPITAL_COMMUNITY)
Admission: EM | Admit: 2012-04-13 | Discharge: 2012-04-15 | DRG: 872 | Disposition: A | Discharge status: Home / Self Care | Payer: MEDICAID | Attending: Internal Medicine | Admitting: Internal Medicine

## 2012-04-13 DIAGNOSIS — F341 Dysthymic disorder: Secondary | ICD-10-CM

## 2012-04-13 DIAGNOSIS — I10 Essential (primary) hypertension: Secondary | ICD-10-CM | POA: Diagnosis present

## 2012-04-13 DIAGNOSIS — F329 Major depressive disorder, single episode, unspecified: Secondary | ICD-10-CM | POA: Diagnosis present

## 2012-04-13 DIAGNOSIS — F1020 Alcohol dependence, uncomplicated: Secondary | ICD-10-CM | POA: Diagnosis present

## 2012-04-13 DIAGNOSIS — E119 Type 2 diabetes mellitus without complications: Secondary | ICD-10-CM | POA: Diagnosis present

## 2012-04-13 DIAGNOSIS — E872 Acidosis, unspecified: Secondary | ICD-10-CM | POA: Diagnosis present

## 2012-04-13 DIAGNOSIS — F418 Other specified anxiety disorders: Secondary | ICD-10-CM | POA: Diagnosis present

## 2012-04-13 DIAGNOSIS — L03116 Cellulitis of left lower limb: Secondary | ICD-10-CM

## 2012-04-13 DIAGNOSIS — F3289 Other specified depressive episodes: Secondary | ICD-10-CM | POA: Diagnosis present

## 2012-04-13 DIAGNOSIS — K219 Gastro-esophageal reflux disease without esophagitis: Secondary | ICD-10-CM | POA: Diagnosis present

## 2012-04-13 DIAGNOSIS — Y929 Unspecified place or not applicable: Secondary | ICD-10-CM

## 2012-04-13 DIAGNOSIS — E785 Hyperlipidemia, unspecified: Secondary | ICD-10-CM

## 2012-04-13 DIAGNOSIS — Z8249 Family history of ischemic heart disease and other diseases of the circulatory system: Secondary | ICD-10-CM

## 2012-04-13 DIAGNOSIS — Z79899 Other long term (current) drug therapy: Secondary | ICD-10-CM

## 2012-04-13 DIAGNOSIS — IMO0002 Reserved for concepts with insufficient information to code with codable children: Secondary | ICD-10-CM

## 2012-04-13 DIAGNOSIS — Z22322 Carrier or suspected carrier of Methicillin resistant Staphylococcus aureus: Secondary | ICD-10-CM

## 2012-04-13 DIAGNOSIS — Z87891 Personal history of nicotine dependence: Secondary | ICD-10-CM

## 2012-04-13 DIAGNOSIS — A419 Sepsis, unspecified organism: Principal | ICD-10-CM | POA: Diagnosis present

## 2012-04-13 DIAGNOSIS — E860 Dehydration: Secondary | ICD-10-CM | POA: Diagnosis present

## 2012-04-13 DIAGNOSIS — L02419 Cutaneous abscess of limb, unspecified: Secondary | ICD-10-CM | POA: Diagnosis present

## 2012-04-13 DIAGNOSIS — F411 Generalized anxiety disorder: Secondary | ICD-10-CM | POA: Diagnosis present

## 2012-04-13 DIAGNOSIS — IMO0001 Reserved for inherently not codable concepts without codable children: Secondary | ICD-10-CM

## 2012-04-13 DIAGNOSIS — I959 Hypotension, unspecified: Secondary | ICD-10-CM

## 2012-04-13 DIAGNOSIS — D696 Thrombocytopenia, unspecified: Secondary | ICD-10-CM | POA: Diagnosis present

## 2012-04-13 DIAGNOSIS — K7030 Alcoholic cirrhosis of liver without ascites: Secondary | ICD-10-CM | POA: Diagnosis present

## 2012-04-13 DIAGNOSIS — L03119 Cellulitis of unspecified part of limb: Secondary | ICD-10-CM | POA: Diagnosis present

## 2012-04-13 DIAGNOSIS — K7460 Unspecified cirrhosis of liver: Secondary | ICD-10-CM | POA: Diagnosis present

## 2012-04-13 DIAGNOSIS — L089 Local infection of the skin and subcutaneous tissue, unspecified: Secondary | ICD-10-CM

## 2012-04-13 DIAGNOSIS — Z23 Encounter for immunization: Secondary | ICD-10-CM

## 2012-04-13 HISTORY — DX: Cramp and spasm: R25.2

## 2012-04-13 LAB — BASIC METABOLIC PANEL
BUN: 17 mg/dL (ref 6–23)
Calcium: 8.9 mg/dL (ref 8.4–10.5)
GFR calc Af Amer: 82 mL/min — ABNORMAL LOW (ref >90)
GFR calc non Af Amer: 70 mL/min — ABNORMAL LOW (ref >90)
Glucose, Bld: 191 mg/dL — ABNORMAL HIGH (ref 70–99)
Potassium: 3.6 mEq/L (ref 3.5–5.1)
Sodium: 135 mEq/L (ref 135–145)

## 2012-04-13 LAB — HEMOGLOBIN A1C
Hgb A1c MFr Bld: 8.6 % — ABNORMAL HIGH (ref <5.7)
Mean Plasma Glucose: 200 mg/dL — ABNORMAL HIGH (ref <117)

## 2012-04-13 LAB — CBC WITH DIFFERENTIAL/PLATELET
Basophils Absolute: 0 10*3/uL (ref 0.0–0.1)
Basophils Relative: 0 % (ref 0–1)
Eosinophils Absolute: 0 10*3/uL (ref 0.0–0.7)
Hemoglobin: 11.7 g/dL — ABNORMAL LOW (ref 13.0–17.0)
MCH: 30.5 pg (ref 26.0–34.0)
MCHC: 34.5 g/dL (ref 30.0–36.0)
Monocytes Relative: 8 % (ref 3–12)
Neutro Abs: 3.3 10*3/uL (ref 1.7–7.7)
Neutrophils Relative %: 76 % (ref 43–77)
RDW: 15 % (ref 11.5–15.5)

## 2012-04-13 LAB — HEPATIC FUNCTION PANEL
ALT: 22 U/L (ref 0–53)
Bilirubin, Direct: 0.4 mg/dL — ABNORMAL HIGH (ref 0.0–0.3)
Indirect Bilirubin: 0.8 mg/dL (ref 0.3–0.9)
Total Bilirubin: 1.2 mg/dL (ref 0.3–1.2)

## 2012-04-13 LAB — GLUCOSE, CAPILLARY: Glucose-Capillary: 219 mg/dL — ABNORMAL HIGH (ref 70–99)

## 2012-04-13 LAB — LACTIC ACID, PLASMA: Lactic Acid, Venous: 2 mmol/L (ref 0.5–2.2)

## 2012-04-13 LAB — MRSA PCR SCREENING: MRSA by PCR: POSITIVE — AB

## 2012-04-13 LAB — VITAMIN B12: Vitamin B-12: 573 pg/mL (ref 211–911)

## 2012-04-13 MED ORDER — ONDANSETRON HCL 4 MG/2ML IJ SOLN: 4.0000 mg | Freq: Four times a day (QID) | INTRAMUSCULAR | Status: DC | PRN

## 2012-04-13 MED ORDER — GLIMEPIRIDE 4 MG PO TABS
4.0000 mg | ORAL_TABLET | Freq: Every day | ORAL | Status: DC
  Administered 2012-04-13 – 2012-04-15 (×3): 4 mg via ORAL
  Filled 2012-04-13 (×4): qty 1

## 2012-04-13 MED ORDER — CHLORHEXIDINE GLUCONATE CLOTH 2 % EX PADS
6.0000 | MEDICATED_PAD | Freq: Every day | CUTANEOUS | Status: DC
  Administered 2012-04-14 – 2012-04-15 (×2): 6 via TOPICAL

## 2012-04-13 MED ORDER — PNEUMOCOCCAL VAC POLYVALENT 25 MCG/0.5ML IJ INJ
0.5000 mL | INJECTION | INTRAMUSCULAR | Status: AC
  Administered 2012-04-14: 0.5 mL via INTRAMUSCULAR
  Filled 2012-04-13: qty 0.5

## 2012-04-13 MED ORDER — OXYCODONE HCL 5 MG PO TABS
5.0000 mg | ORAL_TABLET | ORAL | Status: DC | PRN
  Administered 2012-04-13 – 2012-04-15 (×4): 5 mg via ORAL
  Filled 2012-04-13 (×4): qty 1

## 2012-04-13 MED ORDER — FAMOTIDINE 40 MG PO TABS
40.0000 mg | ORAL_TABLET | Freq: Every day | ORAL | Status: DC
  Administered 2012-04-13 – 2012-04-15 (×3): 40 mg via ORAL
  Filled 2012-04-13: qty 1
  Filled 2012-04-13: qty 2
  Filled 2012-04-13: qty 1

## 2012-04-13 MED ORDER — SODIUM CHLORIDE 0.9 % IV SOLN
INTRAVENOUS | Status: AC
  Administered 2012-04-13: 1000 mL via INTRAVENOUS

## 2012-04-13 MED ORDER — ACETAMINOPHEN 650 MG RE SUPP: 650.0000 mg | Freq: Four times a day (QID) | RECTAL | Status: DC | PRN

## 2012-04-13 MED ORDER — MUPIROCIN 2 % EX OINT
1.0000 "application " | TOPICAL_OINTMENT | Freq: Two times a day (BID) | CUTANEOUS | Status: DC
  Administered 2012-04-13 – 2012-04-15 (×4): 1 via NASAL
  Filled 2012-04-13 (×2): qty 22

## 2012-04-13 MED ORDER — ACETAMINOPHEN 325 MG PO TABS: 650.0000 mg | ORAL_TABLET | Freq: Four times a day (QID) | ORAL | Status: DC | PRN

## 2012-04-13 MED ORDER — VANCOMYCIN HCL 1000 MG IV SOLR
1250.0000 mg | Freq: Two times a day (BID) | INTRAVENOUS | Status: DC
  Administered 2012-04-13 – 2012-04-15 (×4): 1250 mg via INTRAVENOUS
  Filled 2012-04-13 (×5): qty 1250

## 2012-04-13 MED ORDER — MORPHINE SULFATE 2 MG/ML IJ SOLN: 1.0000 mg | INTRAMUSCULAR | Status: DC | PRN

## 2012-04-13 MED ORDER — SODIUM CHLORIDE 0.9 % IV BOLUS (SEPSIS)
1000.0000 mL | Freq: Once | INTRAVENOUS | Status: AC
  Administered 2012-04-13: 1000 mL via INTRAVENOUS

## 2012-04-13 MED ORDER — PIPERACILLIN-TAZOBACTAM 3.375 G IVPB
3.3750 g | Freq: Three times a day (TID) | INTRAVENOUS | Status: DC
  Administered 2012-04-13 – 2012-04-15 (×7): 3.375 g via INTRAVENOUS
  Filled 2012-04-13 (×9): qty 50

## 2012-04-13 MED ORDER — ONDANSETRON HCL 4 MG PO TABS: 4.0000 mg | ORAL_TABLET | Freq: Four times a day (QID) | ORAL | Status: DC | PRN

## 2012-04-13 MED ORDER — SODIUM CHLORIDE 0.9 % IV SOLN
INTRAVENOUS | Status: DC
  Administered 2012-04-13: 1000 mL via INTRAVENOUS
  Administered 2012-04-13: 50 mL/h via INTRAVENOUS

## 2012-04-13 MED ORDER — INSULIN ASPART 100 UNIT/ML ~~LOC~~ SOLN
0.0000 [IU] | Freq: Three times a day (TID) | SUBCUTANEOUS | Status: DC
  Administered 2012-04-13: 3 [IU] via SUBCUTANEOUS
  Administered 2012-04-13: 2 [IU] via SUBCUTANEOUS
  Administered 2012-04-14 (×2): 5 [IU] via SUBCUTANEOUS
  Administered 2012-04-15: 3 [IU] via SUBCUTANEOUS

## 2012-04-13 MED ORDER — VANCOMYCIN HCL IN DEXTROSE 1-5 GM/200ML-% IV SOLN
1000.0000 mg | Freq: Once | INTRAVENOUS | Status: AC
  Administered 2012-04-13: 1000 mg via INTRAVENOUS
  Filled 2012-04-13: qty 200

## 2012-04-13 MED ORDER — ATORVASTATIN CALCIUM 10 MG PO TABS
10.0000 mg | ORAL_TABLET | Freq: Every day | ORAL | Status: DC
  Administered 2012-04-13: 10 mg via ORAL
  Filled 2012-04-13 (×3): qty 1

## 2012-04-13 MED ORDER — INFLUENZA VIRUS VACC SPLIT PF IM SUSP
0.5000 mL | INTRAMUSCULAR | Status: AC
  Administered 2012-04-14: 0.5 mL via INTRAMUSCULAR
  Filled 2012-04-13: qty 0.5

## 2012-04-13 MED ORDER — IMIPRAMINE HCL 50 MG PO TABS
200.0000 mg | ORAL_TABLET | Freq: Every day | ORAL | Status: DC
  Administered 2012-04-13 – 2012-04-14 (×2): 200 mg via ORAL
  Filled 2012-04-13 (×3): qty 4

## 2012-04-13 NOTE — Progress Notes (Signed)
CRITICAL VALUE ALERT  Critical value received:  Positive MRSA swab  Date of notification:  04-13-2012  Time of notification:  1615  Critical value read back:  Yes   Nurse who received alert:  Roney Jaffe  MD notified (1st page):  Dr. Gwenlyn Perking  Time of first page:  1630  MD notified (2nd page):  Time of second page:  Responding MD:  Dr. Gwenlyn Perking  Time MD responded:  [phone]

## 2012-04-13 NOTE — ED Notes (Signed)
Patient able to drink water without issue. Pt has strong pedal pulse to left foot. Pt able to move all digits with exception of middle toe to left foot. Pt reports he has decreased sensation to these toes. Middle toe and second toe having bruising to them.  Pt has small incision to left lower leg. Bleeding controlled. No purulent drainage noted at this time. Pt leg is warm.  Redness to lower leg at wound site and extending up leg.  Pt reports intermittent sharp pain that is 2/10 at worst. Pt reports he is able to ambulate on leg without issue,

## 2012-04-13 NOTE — ED Notes (Signed)
Pt room is in the process of being cleaned per ICU nurse.  Nurse will call ED when room is ready

## 2012-04-13 NOTE — ED Provider Notes (Signed)
Medical screening examination/treatment/procedure(s) were performed by non-physician practitioner and as supervising physician I was immediately available for consultation/collaboration.  Janely Gullickson M Khyrie Masi, MD 04/13/12 1027 

## 2012-04-13 NOTE — H&P (Signed)
[facility] History and Physical  Garrett Turner:[id number] DOB: 1950/11/26 DOA: 04/13/2012  Referring physician: Dr. Dierdre Highman PCP: Georganna Skeans, MD   Chief Complaint: Hypertension, left lower extremity cellulitis.  HPI: s, Garrett Turner is a 61 y.o. male with past medical history significant for hypertension, hyperlipidemia, diabetes mellitus, history of known alcoholic cirrhosis and gastroesophageal reflux; came to the hospital on 04/12/2014 secondary to left lower extremity swelling, redness and pain after a traumatic injury with his wife wheelchair. At that moment was found with cellulitis involving his left lower extremity with some abscess; patient received I & D and was discharged on clindamycin; patient was then informed to return on a 24 hours for further evaluation. During this followup visit patient was found to be hypotensive, lactic acid elevated at 4 and with some worsening on the redness on his left lower extremity. [facility] has been called to admit the patient for early sepsis secondary to lower extremity cellulitis.    Review  of Systems:  Negative except as otherwise mentioned on history of present illness.   Past Medical History  Diagnosis Date  . Diabetes mellitus   . Hypertension   . High cholesterol   . Anxiety disorder   . Pancytopenia   . Inguinal hernia     Left  . Cirrhosis   . UTI (lower urinary tract infection)   . Depression   . Anxiety   . Leg cramps    Past Surgical History  Procedure Date  . Tonsillectomy   . Esophagogastroduodenoscopy 11/04/2011    Procedure: ESOPHAGOGASTRODUODENOSCOPY (EGD);  Surgeon: Willis Modena, MD;  Location: [facility];  Service: Endoscopy;  Laterality: N/A;  . Colonoscopy 11/04/2011    Procedure: COLONOSCOPY;  Surgeon: Willis Modena, MD;  Location: [facility];  Service: Endoscopy;  Laterality: N/A;   Social History:  reports that he has never smoked. He has quit using smokeless tobacco. He  reports that he drinks about 1.1 ounces of alcohol per week. He reports that he does not use illicit drugs. came from home, no needs for assistance with activities of daily living. Patient is his wife caregiver.  No Known Allergies  Family History  Problem Relation Age of Onset  . Coronary artery disease    . Hypertension    . Heart failure Father   . Hypertension Father   . Heart disease Father   . Heart failure Mother   . Hypertension Mother   . Heart disease Mother   . Stroke Paternal Grandmother   . Obesity Daughter     Prior to Admission medications   Medication Sig Start Date End Date Taking? Authorizing Provider  atorvastatin (LIPITOR) 10 MG tablet Take 10 mg by mouth daily.   Yes Historical Provider, MD  betamethasone dipropionate (DIPROLENE) 0.05 % cream Apply topically 2 (two) times daily.   Yes Historical Provider, MD  clindamycin (CLEOCIN) 300 MG capsule Take 1 capsule (300 mg total) by mouth 4 (four) times daily. X 7 days 04/12/12  Yes Phill Mutter Dammen, PA  glimepiride (AMARYL) 4 MG tablet Take 4 mg by mouth daily before breakfast.   Yes Historical Provider, MD  HYDROcodone-acetaminophen (NORCO/VICODIN) 5-325 MG per tablet Take 1 tablet by mouth every 4 (four) hours as needed. 04/12/12  Yes Angus Seller, PA  hydrOXYzine (ATARAX/VISTARIL) 10 MG tablet Take 10 mg by mouth 3 (three) times daily as needed.   Yes Historical Provider, MD  imipramine (TOFRANIL) 50 MG tablet Take 200 mg by mouth at bedtime.  Yes Historical Provider, MD  metFORMIN (GLUCOPHAGE) 1000 MG tablet Take 1,000 mg by mouth 2 (two) times daily with a meal.   Yes Historical Provider, MD  telmisartan-hydrochlorothiazide (MICARDIS HCT) 40-12.5 MG per tablet Take 1 tablet by mouth daily.   Yes Historical Provider, MD   Physical Exam: Filed Vitals:   04/13/12 1610 04/13/12 0729 04/13/12 0733 04/13/12 0936  BP: 112/65 122/69  123/65  Pulse: 81 74  71  Temp:   97.8 F (36.6 C)   TempSrc:   Oral   Resp:  18   18  SpO2:  100%  100%     General:  Afebrile, no acute distress.  Eyes: PERRLA, extraocular muscles intact, no icterus.  ENT: No erythema, no exudate inside his mouth; good dentition. He is no drainage or discharges coming out of his ears and nostrils  Neck: No JVD, no thyromegaly or bruits.  Cardiovascular: Regular rate and rhythm, positive systolic murmur, no rubs or gallops  Respiratory: Clear to auscultation bilaterally.  Abdomen: Soft, nontender, nondistended; positive bowel sounds  Skin: Aspiration of his left forehead; also with left lower extremity with swelling, redness, bruises involving his middle toe I his second toe and also an open wound from previous I and D, perform in the emergency department on 04/12/2012; no purulent discharge is appreciated at this point.  Musculoskeletal: No joint swelling or erythema.  Psychiatric: Appropriate.  Neurologic: No focal deficit appreciated. CN grossly intact. Patient alert, awake and oriented x3.  Labs on Admission:  Basic Metabolic Panel:  Lab 04/13/12 9604 04/11/12 2240  NA 135 131*  K 3.6 3.8  CL 98 97  CO2 21 23  GLUCOSE 191* 342*  BUN 17 12  CREATININE 1.09 0.83  CALCIUM 8.9 9.0  MG -- --  PHOS -- --   Liver Function Tests:  Lab 04/11/12 2240  AST 44*  ALT 30  ALKPHOS 110  BILITOT 1.9*  PROT 7.5  ALBUMIN 3.5   CBC:  Lab 04/13/12 0555 04/11/12 2240  WBC 4.4 4.8  NEUTROABS 3.3 3.2  HGB 11.7* 12.8*  HCT 33.9* 36.3*  MCV 88.5 88.5  PLT 48* PLATELET CLUMPS NOTED ON SMEAR, COUNT APPEARS DECREASED   CBG:  Lab 04/12/12 0526  GLUCAP 209*    Radiological Exams on Admission: Dg Ankle Complete Left  04/11/2012  *RADIOLOGY REPORT*  Clinical Data: Fall 1 week ago.  Swelling and bruising of the right ankle.  LEFT ANKLE COMPLETE - 3+ VIEW  Comparison: 01/20/2009.  Findings: The ankle mortise is congruent.  The talar dome is intact.  The alignment anatomic.  Soft tissue swelling is present in the distal leg  and ankle.  There appears be a large os peroneum inferior to the anterior calcaneus on the lateral view.  Diabetic type small vessel atherosclerotic calcification present in the posterior ankle.  Subcutaneous edema is present in the distal leg.  IMPRESSION: No acute osseous injury.  Stable appearance of the ankle compared to prior.   Original Report Authenticated By: Andreas Newport, M.D.    Dg Foot Complete Left  04/12/2012  *RADIOLOGY REPORT*  Clinical Data: Foot pain.  Foot trauma.  LEFT FOOT - COMPLETE 3+ VIEW  Comparison: 04/11/2012.  01/20/2009.  Findings: No displaced fracture is identified.  There is flexion of the third through fifth toes.  Mild first MTP joint osteoarthritis is present.  Prominent os peroneus is present.  Metatarsals appear intact. Suboptimal visualization of the phalanges of the small toe.  IMPRESSION: No acute osseous  abnormality.  No displaced fracture identified.   Original Report Authenticated By: Andreas Newport, M.D.     Assessment/Plan 1-Early Sepsis secondary to left lower extremity cellulitis: Patient will be admitted to step down initially for close followup, will provide fluid resuscitation, cycle lactic acid in order to follow improvement, patient will be started on broad-spectrum antibiotics through his veins; will ask wound care to evaluate the patient and provide recommendations.  2-DM (diabetes mellitus): Will hold metformin while in the hospital, patient will continue Amaryl and will start him on sliding scale insulin. Will check A1c. Continue modified carbohydrate diet  3-HTN (hypertension): With episode of hypotension secondary to dehydration and early sepsis most likely. Will hold antihypertensive drugs at this moment and provide IV fluids.  4-Left leg cellulitis: Secondary to traumatic injury; 22 history of diabetes will start broad spectrum antibiotics using vancomycin and Zosyn and will ask wound care service to provide recommendations for treatment of  his wound. Patient had I & D performed on 04/12/12; purulent discharge appreciated at that time.  5-Lactic acid acidosis: Secondary to dehydration and early sepsis. Metformin plain also a role in this situation with his elevated lactic acid. Will stop metformin and provide fluid resuscitation. Will follow lactic acid trend. Patient started on IV antibiotics (broad-spectrum).  6-GERD (gastroesophageal reflux disease): Continue Pepcid.  7-Cirrhosis of liver, chronic history of: appears to be secondary to non-alcoholic cirrhosis. At this point stable with no elevation of his LFTs. Patient will require followup by a hepatologist as an outpatient for further evaluation and treatment. Will check PT and PTT.  8-Thrombocytopenia: Secondary to #7; stable and without signs of acute bleeding. No petechiae appreciated on his skin. Will avoid heparin products.  9-Depression with anxiety: Stable. Patient currently not taking any medications for this problem. Will follow.  10-hyperlipidemia: At this moment we'll check a lipid panel and continue statins. LFTs within normal limits.  DVT: SCDs.   Code Status: Full Family Communication: no family at bedside Disposition Plan: Home when medically stable  Time spent: > 30 minutes  Lorae Roig [facility] Pager [phone]  If 7PM-7AM, please contact night-coverage [email or web address] Password [facility] 04/13/2012, 10:11 AM

## 2012-04-13 NOTE — ED Notes (Signed)
Patient bed assignment checked on and revised. Pt lunch tray ordered.

## 2012-04-13 NOTE — ED Notes (Signed)
Pt was seen 9/23 for cellulitis on LLE. Pt has return to eval wound.VSS pwd

## 2012-04-13 NOTE — ED Notes (Signed)
md requesting pt drink large amount of PO fluids

## 2012-04-13 NOTE — ED Notes (Signed)
Pt room is ready and has been changed to room 1223.

## 2012-04-13 NOTE — ED Provider Notes (Signed)
History     CSN: [id number]  Arrival date & time 04/13/12  0444   First MD Initiated Contact with Patient 04/13/12 0502      Chief Complaint  Patient presents with  . Wound Check  . Hypotension    (Consider location/radiation/quality/duration/timing/severity/associated sxs/prior treatment) HPI  Patient presents to the ER for wound recheck. He was seen in the ER for cellulitis with abscess yesterday. His abscess was drained in the LLE, lateral distal tib/fib, he was given IV Clindamycin in ED. He says that he feels as though it is getting better but admits that he felt weak when he woke up this morning to come, that he had to set down on the floor. He lives at home with his wife who he is the caregiver to and is concerned about having to stay in the ED.  Pt is found to be hypotensive in triage. BP 92/54 and his pulse is 102.   Past Medical History  Diagnosis Date  . Diabetes mellitus   . Hypertension   . High cholesterol   . Anxiety disorder   . Pancytopenia   . Inguinal hernia     Left  . Cirrhosis   . UTI (lower urinary tract infection)   . Depression   . Anxiety     Past Surgical History  Procedure Date  . Tonsillectomy   . Esophagogastroduodenoscopy 11/04/2011    Procedure: ESOPHAGOGASTRODUODENOSCOPY (EGD);  Surgeon: Willis Modena, MD;  Location: [facility];  Service: Endoscopy;  Laterality: N/A;  . Colonoscopy 11/04/2011    Procedure: COLONOSCOPY;  Surgeon: Willis Modena, MD;  Location: [facility];  Service: Endoscopy;  Laterality: N/A;    Family History  Problem Relation Age of Onset  . Coronary artery disease    . Hypertension    . Heart failure Father   . Hypertension Father   . Heart disease Father   . Heart failure Mother   . Hypertension Mother   . Heart disease Mother   . Stroke Paternal Grandmother   . Obesity Daughter     History  Substance Use Topics  . Smoking status: Never Smoker   . Smokeless tobacco: Former Neurosurgeon  . Alcohol Use: 1.1  oz/week    1 Glasses of wine, 1 Drinks containing 0.5 oz of alcohol per week      Review of Systems   Review of Systems  Gen: no weight loss, fevers, chills, night sweats, orthostatic Eyes: no discharge or drainage, no occular pain or visual changes  Nose: no epistaxis or rhinorrhea  Mouth: no dental pain, no sore throat  Neck: no neck pain  Lungs:No wheezing, coughing or hemoptysis CV: no chest pain, palpitations, dependent edema or orthopnea  Abd: no abdominal pain, nausea, vomiting  GU: no dysuria or gross hematuria  MSK:  No abnormalities  Neuro: no headache, no focal neurologic deficits  Skin: cellulitis to the LLE Psyche: negative.    Allergies  Review of patient's allergies indicates no known allergies.  Home Medications   Current Outpatient Rx  Name Route Sig Dispense Refill  . ATORVASTATIN CALCIUM 10 MG PO TABS Oral Take 10 mg by mouth daily.    Marland Kitchen BETAMETHASONE DIPROPIONATE 0.05 % EX CREA Topical Apply topically 2 (two) times daily.    Marland Kitchen CLINDAMYCIN HCL 300 MG PO CAPS Oral Take 1 capsule (300 mg total) by mouth 4 (four) times daily. X 7 days 28 capsule 0  . GLIMEPIRIDE 4 MG PO TABS Oral Take 4 mg by mouth daily  before breakfast.    . HYDROCODONE-ACETAMINOPHEN 5-325 MG PO TABS Oral Take 1 tablet by mouth every 4 (four) hours as needed.    Marland Kitchen HYDROXYZINE HCL 10 MG PO TABS Oral Take 10 mg by mouth 3 (three) times daily as needed.    . IMIPRAMINE HCL 50 MG PO TABS Oral Take 200 mg by mouth at bedtime.      Marland Kitchen METFORMIN HCL 1000 MG PO TABS Oral Take 1,000 mg by mouth 2 (two) times daily with a meal.    . TELMISARTAN-HCTZ 40-12.5 MG PO TABS Oral Take 1 tablet by mouth daily.      BP 78/52  Pulse 102  Temp 98.5 F (36.9 C) (Oral)  Resp 16  SpO2 100%  Physical Exam  Nursing note and vitals reviewed. Constitutional: He appears well-developed and well-nourished. No distress.  HENT:  Head: Normocephalic and atraumatic.  Eyes: Pupils are equal, round, and reactive  to light.  Neck: Normal range of motion. Neck supple.  Cardiovascular: Normal rate and regular rhythm.   Pulmonary/Chest: Effort normal.  Abdominal: Soft.  Neurological: He is alert.  Skin: Skin is warm and dry. Rash noted.       ED Course  Procedures (including critical care time)  Results for orders placed during the hospital encounter of 04/13/12  CBC WITH DIFFERENTIAL      Component Value Range   WBC 4.4  4.0 - 10.5 K/uL   RBC 3.83 (*) 4.22 - 5.81 MIL/uL   Hemoglobin 11.7 (*) 13.0 - 17.0 g/dL   HCT 10.9 (*) 60.4 - 54.0 %   MCV 88.5  78.0 - 100.0 fL   MCH 30.5  26.0 - 34.0 pg   MCHC 34.5  30.0 - 36.0 g/dL   RDW 98.1  19.1 - 47.8 %   Platelets 48 (*) 150 - 400 K/uL   Neutrophils Relative 76  43 - 77 %   Neutro Abs 3.3  1.7 - 7.7 K/uL   Lymphocytes Relative 15  12 - 46 %   Lymphs Abs 0.7  0.7 - 4.0 K/uL   Monocytes Relative 8  3 - 12 %   Monocytes Absolute 0.4  0.1 - 1.0 K/uL   Eosinophils Relative 1  0 - 5 %   Eosinophils Absolute 0.0  0.0 - 0.7 K/uL   Basophils Relative 0  0 - 1 %   Basophils Absolute 0.0  0.0 - 0.1 K/uL  LACTIC ACID, PLASMA      Component Value Range   Lactic Acid, Venous 4.1 (*) 0.5 - 2.2 mmol/L  BASIC METABOLIC PANEL      Component Value Range   Sodium 135  135 - 145 mEq/L   Potassium 3.6  3.5 - 5.1 mEq/L   Chloride 98  96 - 112 mEq/L   CO2 21  19 - 32 mEq/L   Glucose, Bld 191 (*) 70 - 99 mg/dL   BUN 17  6 - 23 mg/dL   Creatinine, Ser 2.95  0.50 - 1.35 mg/dL   Calcium 8.9  8.4 - 62.1 mg/dL   GFR calc non Af Amer 70 (*) >90 mL/min   GFR calc Af Amer 82 (*) >90 mL/min   Dg Ankle Complete Left  04/11/2012  *RADIOLOGY REPORT*  Clinical Data: Fall 1 week ago.  Swelling and bruising of the right ankle.  LEFT ANKLE COMPLETE - 3+ VIEW  Comparison: 01/20/2009.  Findings: The ankle mortise is congruent.  The talar dome is intact.  The alignment anatomic.  Soft tissue swelling is present in the distal leg and ankle.  There appears be a large os  peroneum inferior to the anterior calcaneus on the lateral view.  Diabetic type small vessel atherosclerotic calcification present in the posterior ankle.  Subcutaneous edema is present in the distal leg.  IMPRESSION: No acute osseous injury.  Stable appearance of the ankle compared to prior.   Original Report Authenticated By: Andreas Newport, M.D.    Dg Foot Complete Left  04/12/2012  *RADIOLOGY REPORT*  Clinical Data: Foot pain.  Foot trauma.  LEFT FOOT - COMPLETE 3+ VIEW  Comparison: 04/11/2012.  01/20/2009.  Findings: No displaced fracture is identified.  There is flexion of the third through fifth toes.  Mild first MTP joint osteoarthritis is present.  Prominent os peroneus is present.  Metatarsals appear intact. Suboptimal visualization of the phalanges of the small toe.  IMPRESSION: No acute osseous abnormality.  No displaced fracture identified.   Original Report Authenticated By: Andreas Newport, M.D.    Previous records reviewed.   7:16 AM d/w MED - plan step down ICU Dr Gwenlyn Perking MDM    CRITICAL CARE Performed by: Dorthula Matas   Total critical care time: 30 minutes  Critical care time was exclusive of separately billable procedures and treating other patients.  Critical care was necessary to treat or prevent imminent or life-threatening deterioration.  Critical care was time spent personally by me on the following activities: development of treatment plan with patient and/or surrogate as well as nursing, discussions with consultants, evaluation of patient's response to treatment, examination of patient, obtaining history from patient or surrogate, ordering and performing treatments and interventions, ordering and review of laboratory studies, ordering and review of radiographic studies, pulse oximetry and re-evaluation of patient's condition. mentating well, concern for lactate over 4. MED c/s step down ICU admit.    Pts is hypotensive. 2 IVs started, Vancomycin IV - pt is  orthostatic positive.  Will be admitted for early sepsis/ wound infection.       Sunnie Nielsen, MD 04/13/12 [phone]

## 2012-04-14 LAB — LACTIC ACID, PLASMA: Lactic Acid, Venous: 1.1 mmol/L (ref 0.5–2.2)

## 2012-04-14 LAB — CBC
HCT: 30.2 % — ABNORMAL LOW (ref 39.0–52.0)
MCHC: 35.1 g/dL (ref 30.0–36.0)
MCV: 88.3 fL (ref 78.0–100.0)
RDW: 15 % (ref 11.5–15.5)

## 2012-04-14 LAB — LIPID PANEL: LDL Cholesterol: 152 mg/dL — ABNORMAL HIGH (ref 0–99)

## 2012-04-14 LAB — GLUCOSE, CAPILLARY: Glucose-Capillary: 186 mg/dL — ABNORMAL HIGH (ref 70–99)

## 2012-04-14 LAB — BASIC METABOLIC PANEL
BUN: 14 mg/dL (ref 6–23)
CO2: 20 mEq/L (ref 19–32)
Chloride: 105 mEq/L (ref 96–112)
Creatinine, Ser: 0.89 mg/dL (ref 0.50–1.35)
GFR calc Af Amer: >90 mL/min (ref >90)

## 2012-04-14 MED ORDER — GLUCERNA SHAKE PO LIQD
237.0000 mL | Freq: Two times a day (BID) | ORAL | Status: DC
  Administered 2012-04-14 – 2012-04-15 (×3): 237 mL via ORAL
  Filled 2012-04-14 (×6): qty 237

## 2012-04-14 NOTE — Progress Notes (Signed)
[facility] PROGRESS NOTE  Garrett Turner:[id number] DOB: 02-07-1951 DOA: 04/13/2012 PCP: Georganna Skeans, MD  Assessment/Plan: 1-Early Sepsis secondary to left lower extremity cellulitis: Patient doing a lot better; at this point BP stable, no fever and with resolution of elevated lactic acid (1.1); will continue IV antibiotics for one more day, follow wound care rec's and move patient to med surg bed.  2-DM (diabetes mellitus): Will continue holding metformin while in the hospital, continue Amaryl, and will continue sliding scale insulin. A1c 8.6. Continue modified carbohydrate diet. Will adjust hypoglycemic home regimen at discharge.   4-Left leg cellulitis: Secondary to traumatic injury; due to diabetes will continue one more day of broad spectrum antibiotics using vancomycin and Zosyn and will ask wound care service to provide recommendations for treatment of his wound. Patient had I & D performed on 04/12/12; purulent discharge appreciated at that time. Redness and swelling improved.  5-Lactic acid acidosis: resolved with IVF's and antibiotics. Lactic acid now 1.1  6-GERD (gastroesophageal reflux disease): Continue Pepcid.   7-Cirrhosis of liver, chronic history of: appears to be secondary to non-alcoholic cirrhosis. At this point stable with no significant elevation of his LFTs. Patient will require followup by a hepatologist as an outpatient for further inpatient evaluation or workup at this time.    8-Thrombocytopenia: Secondary to #7; stable and without signs of acute bleeding. No petechiae appreciated on his skin. Will avoid heparin products.   9-Depression with anxiety: Stable. Patient currently not taking any medications for this problem. Will follow.   10-hyperlipidemia: Continue statins. LFTs within normal limits.   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  DVT: SCDs.   Code Status: Full Family Communication: no family at  bedside Disposition Plan: transfer to med surg bed; continue iv antibiotics and wound care rec's   Brief narrative: 61 y.o. male with past medical history significant for hypertension, hyperlipidemia, diabetes mellitus, history of known alcoholic cirrhosis and gastroesophageal reflux; came to the hospital on 04/12/2014 secondary to left lower extremity swelling, redness and pain after a traumatic injury with his wife wheelchair. At that moment was found with cellulitis involving his left lower extremity with some abscess; patient received I & D and was discharged on clindamycin; patient was then informed to return on a 24 hours for further evaluation. During this followup visit patient was found to be hypotensive, lactic acid elevated at 4 and with some worsening on the redness on his left lower extremity.  Consultants:  Wound care  Antibiotics:  vanc  zosyn  HPI/Subjective: Afebrile, no CP, no SOB, stable VS and feeling better. Still with mild pain on his LLE; but able to put weight on it.  Objective: Filed Vitals:   04/14/12 0200 04/14/12 0400 04/14/12 0600 04/14/12 0700  BP: 102/61 98/65 109/75   Pulse: 81 73 82 89  Temp:  98 F (36.7 C)    TempSrc:  Oral    Resp: 15 15 18 20   Height:      Weight:      SpO2: 96% 96% 96% 95%    Intake/Output Summary (Last 24 hours) at 04/14/12 0812 Last data filed at 04/14/12 0600  Gross per 24 hour  Intake   1295 ml  Output   1925 ml  Net   -630 ml   Filed Weights   04/13/12 1300  Weight: 90.6 kg (199 lb 11.8 oz)    Exam:   General:  NAD  Cardiovascular: RRR, no rubs or gallops  Respiratory: CTA bilaterally  Abdomen:  soft, NT, ND; positive BS  Extremities: less swelling and redness on his LLE; no purelent discharge from I & D wound.  Neuro: non focal  Data Reviewed: Basic Metabolic Panel:  Lab 04/14/12 8657 04/13/12 0555 04/11/12 2240  NA 134* 135 131*  K 3.9 3.6 3.8  CL 105 98 97  CO2 20 21 23   GLUCOSE 139*  191* 342*  BUN 14 17 12   CREATININE 0.89 1.09 0.83  CALCIUM 8.4 8.9 9.0  MG -- -- --  PHOS -- -- --   Liver Function Tests:  Lab 04/13/12 1020 04/11/12 2240  AST 34 44*  ALT 22 30  ALKPHOS 95 110  BILITOT 1.2 1.9*  PROT 6.2 7.5  ALBUMIN 2.7* 3.5   CBC:  Lab 04/14/12 0315 04/13/12 0555 04/11/12 2240  WBC 3.0* 4.4 4.8  NEUTROABS -- 3.3 3.2  HGB 10.6* 11.7* 12.8*  HCT 30.2* 33.9* 36.3*  MCV 88.3 88.5 88.5  PLT 48* 48* PLATELET CLUMPS NOTED ON SMEAR, COUNT APPEARS DECREASED   CBG:  Lab 04/13/12 2134 04/13/12 1718 04/13/12 1302 04/12/12 0526  GLUCAP 219* 172* 159* 209*    Recent Results (from the past 240 hour(s))  MRSA PCR SCREENING     Status: Abnormal   Collection Time   04/13/12 12:58 PM      Component Value Range Status Comment   MRSA by PCR POSITIVE (*) NEGATIVE Final      Studies: No results found.  Scheduled Meds:   . sodium chloride   Intravenous STAT  . atorvastatin  10 mg Oral QPC supper  . Chlorhexidine Gluconate Cloth  6 each Topical Q0600  . famotidine  40 mg Oral Daily  . glimepiride  4 mg Oral QAC breakfast  . imipramine  200 mg Oral QHS  . influenza  inactive virus vaccine  0.5 mL Intramuscular Tomorrow-1000  . insulin aspart  0-15 Units Subcutaneous TID WC  . mupirocin ointment  1 application Nasal BID  . piperacillin-tazobactam (ZOSYN)  IV  3.375 g Intravenous Q8H  . pneumococcal 23 valent vaccine  0.5 mL Intramuscular Tomorrow-1000  . vancomycin  1,250 mg Intravenous Q12H   Continuous Infusions:   . sodium chloride 50 mL/hr (04/13/12 1423)    Time spent: > 30 minutes    Laqueta Bonaventura  [facility] Pager [phone]. If 8PM-8AM, please contact night-coverage at [email or web address], password [facility] 04/14/2012, 8:13 AM  LOS: 1 day

## 2012-04-15 DIAGNOSIS — K219 Gastro-esophageal reflux disease without esophagitis: Secondary | ICD-10-CM

## 2012-04-15 DIAGNOSIS — E872 Acidosis: Secondary | ICD-10-CM

## 2012-04-15 DIAGNOSIS — L089 Local infection of the skin and subcutaneous tissue, unspecified: Secondary | ICD-10-CM

## 2012-04-15 DIAGNOSIS — I10 Essential (primary) hypertension: Secondary | ICD-10-CM

## 2012-04-15 DIAGNOSIS — A419 Sepsis, unspecified organism: Principal | ICD-10-CM

## 2012-04-15 DIAGNOSIS — E119 Type 2 diabetes mellitus without complications: Secondary | ICD-10-CM

## 2012-04-15 DIAGNOSIS — D696 Thrombocytopenia, unspecified: Secondary | ICD-10-CM

## 2012-04-15 DIAGNOSIS — E785 Hyperlipidemia, unspecified: Secondary | ICD-10-CM

## 2012-04-15 DIAGNOSIS — K7460 Unspecified cirrhosis of liver: Secondary | ICD-10-CM

## 2012-04-15 DIAGNOSIS — T148XXA Other injury of unspecified body region, initial encounter: Secondary | ICD-10-CM

## 2012-04-15 LAB — GLUCOSE, CAPILLARY
Glucose-Capillary: 92 mg/dL (ref 70–99)
Glucose-Capillary: 166 mg/dL — ABNORMAL HIGH (ref 70–99)

## 2012-04-15 MED ORDER — BACITRACIN-NEOMYCIN-POLYMYXIN OINTMENT TUBE: 1.0000 "application " | TOPICAL_OINTMENT | Freq: Every day | CUTANEOUS | Status: DC

## 2012-04-15 MED ORDER — ATORVASTATIN CALCIUM 20 MG PO TABS: 20.0000 mg | ORAL_TABLET | Freq: Every day | ORAL | Status: DC

## 2012-04-15 MED ORDER — FAMOTIDINE 40 MG PO TABS: 40.0000 mg | ORAL_TABLET | Freq: Every day | ORAL | Status: DC

## 2012-04-15 MED ORDER — DOXYCYCLINE HYCLATE 100 MG PO TABS: 100.0000 mg | ORAL_TABLET | Freq: Two times a day (BID) | ORAL | Status: AC

## 2012-04-15 MED ORDER — GLUCERNA SHAKE PO LIQD: 237.0000 mL | Freq: Two times a day (BID) | ORAL | Status: DC

## 2012-04-15 MED ORDER — BACITRACIN-NEOMYCIN-POLYMYXIN OINTMENT TUBE
TOPICAL_OINTMENT | Freq: Every day | CUTANEOUS | Status: DC
  Filled 2012-04-15: qty 15

## 2012-04-15 MED ORDER — HYDROCODONE-ACETAMINOPHEN 5-325 MG PO TABS: 1.0000 | ORAL_TABLET | ORAL | Status: DC | PRN

## 2012-04-15 NOTE — Consult Note (Signed)
WOC consult Note Reason for Consult: evaluate pts wound, left lower extremity.  Pt reports he sustained a fall and hit is head and his leg.  He presented to urgent care for evaluation and has sutures placed in his head and had a area on the left lateral leg I&D, he reports purulent material expressed at that time.  He then came into ER for further evaluation for worsening of the leg wound.  He presents today with LE skin changes related to psoriasis and a puncture wound to the LLE that is draining scant amount of dark blood.  He does have some bruising in the 2-5th toes and the lateral foot.  I would suspect he had hematoma form at the time of the injury and this has resulted in pooling at the toes, foot and ankle.  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  I cleaned away the small amount of dark blood and attempted to probe the wound.  I was not able to express any further pus or even blood.  Their was no active bleeding.   Wound type: trauma with underlying subcutaneous hematoma Measurement: puncture site from I&D is 0.2cm x .5cm, with aprox. 0.2 cm depth Wound bed:not able to visualize Drainage (amount, consistency, odor) scant old blood Periwound: intact, noted raised area in the region of the puncture wound but not significantly fluctuant.  Dressing procedure/placement/frequency: triple antibiotic ointment to puncture site cover with large Band-Aid or protective dressing. Discussed POC with pt and with bedside nursing. Explained needed care once dc to home.  Ok with me to shower with dressing off and then dry and replace with TAB and Band-Aid.   Have this area rechecked at the time of head suture removal.  Re consult if needed, will not follow at this time. Thanks  Alf [employer], CWOCN [phone])

## 2012-04-15 NOTE — Discharge Summary (Signed)
Physician Discharge Summary  Garrett Turner [id number] DOB: 11/28/1950 DOA: 04/13/2012  PCP: Georganna Skeans, MD  Admit date: 04/13/2012 Discharge date: 04/15/2012  Recommendations for Outpatient Follow-up:  1.  Follow with PCP in 2 weeks (Needs further adjustments for DM and HLD. Patient will also need arrangements to follow with an hepatologist for his liver disease)    Discharge Diagnoses:  Principal Problem:  *Sepsis Active Problems:  DM (diabetes mellitus)  HTN (hypertension)  Left leg cellulitis  Lactic acid acidosis  GERD (gastroesophageal reflux disease)  Cirrhosis of liver  Thrombocytopenia  Depression with anxiety   Discharge Condition: stable and improved. Will follow with PCP; take antibiotics as instructed and will follow wound care and discharge instructions.  Diet recommendation: low carb diet and low sodium diet.  Filed Weights   04/13/12 1300  Weight: 90.6 kg (199 lb 11.8 oz)    History of present illness:  61 y.o. male with past medical history significant for hypertension, hyperlipidemia, diabetes mellitus, history of known alcoholic cirrhosis and gastroesophageal reflux; came to the hospital on 04/12/2014 secondary to left lower extremity swelling, redness and pain after a traumatic injury with his wife wheelchair. At that moment was found with cellulitis involving his left lower extremity with some abscess; patient received I & D and was discharged on clindamycin; patient was then informed to return on a 24 hours for further evaluation. During this followup visit patient was found to be hypotensive, lactic acid elevated at 4 and with some worsening on the redness on his left lower extremity.   Hospital Course:  1-Early Sepsis secondary to left lower extremity cellulitis: Patient doing a lot better; at this point BP stable, no fever and with resolution of elevated lactic acid (1.1); will continue IV antibiotics for one more day, follow wound care rec's and  move patient to med surg bed.   2-DM (diabetes mellitus): Will resume metformin  And amaryl, and will advise for low carb diet and close follow up with PCP.  3-Left leg cellulitis: Secondary to traumatic injury; due to diabetes and positive MRSA patient was admitted and treated with IV antibiotics until improvements on his leg appreciated, and no signs or symptoms for sepsis seen. Will now discharge home with 12 more days of doxycycline BID and wound care instructions.  4-Lactic acid acidosis: resolved with IVF's and antibiotics. Lactic acid (1.1) WNL.   5-GERD (gastroesophageal reflux disease): Continue Pepcid.   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 At this point stable with no significant elevation of his LFTs. Patient will require followup by a hepatologist as an outpatient for further evaluation and treatment.   7-Thrombocytopenia: Secondary to #7; stable and without signs of acute bleeding. No petechiae appreciated on his skin. And no bleeding. No heparin products given for DVT during admission.  8-Depression with anxiety: Stable. Patient currently not taking any medications for this problem. Will follow up with PCP as an outpatient and start medication if needed..   9-hyperlipidemia: Continue statins, but dose increased to 20mg  instead for better control. LDL 152.   10-MRSA: decolonizing treatment and contact isolation provided while in the hospital..    Consultations:  Wound care  Discharge Exam: Filed Vitals:   04/14/12 1536 04/14/12 2200 04/15/12 0549 04/15/12 0551  BP: 99/61 123/74 130/80 136/71  Pulse: 73 70 74 77  Temp: 98.3 F (36.8 C) 98.5 F (36.9 C) 98.6 F (37 C) 98.5 F (36.9 C)  TempSrc: Oral Oral Oral Oral  Resp:  16 18 18 18   Height:      Weight:      SpO2: 98% 99% 96% 96%    General: NAD; afebrile Cardiovascular: RRR, no rubs or gallops Respiratory: CTA Abdomen: soft, NT, ND; positive  BS Extremities: significant decrease in swelling and erythema; no purulent discharge out of I & D wound and just mild to moderate pain.   Discharge Instructions  Discharge Orders    Future Orders Please Complete By Expires   Diet - low sodium heart healthy      Discharge instructions      Comments:   -Take medications as prescribed -Keep yourself well hydrated -follow with primary care physician -Apply triple antibiotic ointment to puncture site cover with large Band-Aid or protective dressing; due this care once a day.       Medication List     As of 04/15/2012 12:49 PM    STOP taking these medications         clindamycin 300 MG capsule   Commonly known as: CLEOCIN      TAKE these medications         atorvastatin 20 MG tablet   Commonly known as: LIPITOR   Take 1 tablet (20 mg total) by mouth daily.      betamethasone dipropionate 0.05 % cream   Commonly known as: DIPROLENE   Apply topically 2 (two) times daily.      famotidine 40 MG tablet   Commonly known as: PEPCID   Take 1 tablet (40 mg total) by mouth daily.      feeding supplement Liqd   Take 237 mLs by mouth 2 (two) times daily between meals.      glimepiride 4 MG tablet   Commonly known as: AMARYL   Take 4 mg by mouth daily before breakfast.      HYDROcodone-acetaminophen 5-325 MG per tablet   Commonly known as: NORCO/VICODIN   Take 1 tablet by mouth every 4 (four) hours as needed.      hydrOXYzine 10 MG tablet   Commonly known as: ATARAX/VISTARIL   Take 10 mg by mouth 3 (three) times daily as needed.      imipramine 50 MG tablet   Commonly known as: TOFRANIL   Take 200 mg by mouth at bedtime.      metFORMIN 1000 MG tablet   Commonly known as: GLUCOPHAGE   Take 1,000 mg by mouth 2 (two) times daily with a meal.      neomycin-bacitracin-polymyxin Oint   Commonly known as: NEOSPORIN   Apply 1 application topically daily.      telmisartan-hydrochlorothiazide 40-12.5 MG per tablet   Commonly  known as: MICARDIS HCT   Take 1 tablet by mouth daily.           Follow-up Information    Follow up with [facility], [place]. In 2 weeks.   Contact information:   [address] [address] [phone]           The results of significant diagnostics from this hospitalization (including imaging, microbiology, ancillary and laboratory) are listed below for reference.    Significant Diagnostic Studies: Dg Ankle Complete Left  May 11, 2012  *RADIOLOGY REPORT*  Clinical Data: Fall 1 week ago.  Swelling and bruising of the right ankle.  LEFT ANKLE COMPLETE - 3+ VIEW  Comparison: 01/20/2009.  Findings: The ankle mortise is congruent.  The talar dome is intact.  The alignment anatomic.  Soft tissue swelling is present in the distal  leg and ankle.  There appears be a large os peroneum inferior to the anterior calcaneus on the lateral view.  Diabetic type small vessel atherosclerotic calcification present in the posterior ankle.  Subcutaneous edema is present in the distal leg.  IMPRESSION: No acute osseous injury.  Stable appearance of the ankle compared to prior.   Original Report Authenticated By: Andreas Newport, M.D.    Dg Foot Complete Left  04/12/2012  *RADIOLOGY REPORT*  Clinical Data: Foot pain.  Foot trauma.  LEFT FOOT - COMPLETE 3+ VIEW  Comparison: 04/11/2012.  01/20/2009.  Findings: No displaced fracture is identified.  There is flexion of the third through fifth toes.  Mild first MTP joint osteoarthritis is present.  Prominent os peroneus is present.  Metatarsals appear intact. Suboptimal visualization of the phalanges of the small toe.  IMPRESSION: No acute osseous abnormality.  No displaced fracture identified.   Original Report Authenticated By: Andreas Newport, M.D.     Microbiology: Recent Results (from the past 240 hour(s))  MRSA PCR SCREENING     Status: Abnormal   Collection Time   04/13/12 12:58 PM      Component Value Range Status Comment   MRSA by PCR POSITIVE  (*) NEGATIVE Final      Labs: Basic Metabolic Panel:  Lab 04/14/12 0454 04/13/12 0555 04/11/12 2240  NA 134* 135 131*  K 3.9 3.6 3.8  CL 105 98 97  CO2 20 21 23   GLUCOSE 139* 191* 342*  BUN 14 17 12   CREATININE 0.89 1.09 0.83  CALCIUM 8.4 8.9 9.0  MG -- -- --  PHOS -- -- --   Liver Function Tests:  Lab 04/13/12 1020 04/11/12 2240  AST 34 44*  ALT 22 30  ALKPHOS 95 110  BILITOT 1.2 1.9*  PROT 6.2 7.5  ALBUMIN 2.7* 3.5   CBC:  Lab 04/14/12 0315 04/13/12 0555 04/11/12 2240  WBC 3.0* 4.4 4.8  NEUTROABS -- 3.3 3.2  HGB 10.6* 11.7* 12.8*  HCT 30.2* 33.9* 36.3*  MCV 88.3 88.5 88.5  PLT 48* 48* PLATELET CLUMPS NOTED ON SMEAR, COUNT APPEARS DECREASED   CBG:  Lab 04/14/12 2203 04/14/12 1800 04/14/12 1151 04/14/12 0814 04/13/12 2134  GLUCAP 186* 215* 227* 87 219*    Time coordinating discharge: >30 minutes  Signed:  Sheala Dosh  [facility] 04/15/2012, 12:49 PM

## 2012-04-15 NOTE — Progress Notes (Signed)
ANTIBIOTIC CONSULT NOTE - FOLLOW UP  Pharmacy Consult for Vancomycin Indication: Early Sepsis secondary to LLE cellulitis  No Known Allergies  Patient Measurements: Height: 6\' 2"  (188 cm) Weight: 199 lb 11.8 oz (90.6 kg) IBW/kg (Calculated) : 82.2    Vital Signs: Temp: 98.5 F (36.9 C) (09/26 2200) Temp src: Oral (09/26 2200) BP: 123/74 mmHg (09/26 2200) Pulse Rate: 70  (09/26 2200) Intake/Output from previous day: 09/26 0701 - 09/27 0700 In: 668.3 [P.O.:240; I.V.:378.3; IV Piggyback:50] Out: 600 [Urine:600] Intake/Output from this shift:    Labs:  Basename 04/14/12 0315 04/13/12 0555  WBC 3.0* 4.4  HGB 10.6* 11.7*  PLT 48* 48*  LABCREA -- --  CREATININE 0.89 1.09   Estimated Creatinine Clearance: 98.8 ml/min (by C-G formula based on Cr of 0.89).  Basename 04/15/12 0233  VANCOTROUGH 15.6  VANCOPEAK --  VANCORANDOM --  GENTTROUGH --  GENTPEAK --  GENTRANDOM --  TOBRATROUGH --  TOBRAPEAK --  TOBRARND --  AMIKACINPEAK --  AMIKACINTROU --  AMIKACIN --     Microbiology: Recent Results (from the past 720 hour(s))  MRSA PCR SCREENING     Status: Abnormal   Collection Time   04/13/12 12:58 PM      Component Value Range Status Comment   MRSA by PCR POSITIVE (*) NEGATIVE Final     Anti-infectives     Start     Dose/Rate Route Frequency Ordered Stop   04/13/12 1500   vancomycin (VANCOCIN) 1,250 mg in sodium chloride 0.9 % 250 mL IVPB        1,250 mg 166.7 mL/hr over 90 Minutes Intravenous Every 12 hours 04/13/12 1105     04/13/12 1200  piperacillin-tazobactam (ZOSYN) IVPB 3.375 g       3.375 g 12.5 mL/hr over 240 Minutes Intravenous Every 8 hours 04/13/12 1015     04/13/12 0530   vancomycin (VANCOCIN) IVPB 1000 mg/200 mL premix        1,000 mg 200 mL/hr over 60 Minutes Intravenous  Once 04/13/12 0524 04/13/12 0703          Assessment: 61 yo male admitted with early sepsis w/ some abscess secondary to LLE cellulitis.  VT = 15.6 mcg/ml @  Css.   Goal of Therapy:  Vancomycin trough level 15-20 mcg/ml  Plan:   Continue Vancomycin 1250mg  IV q12h.  F/U SCr/levels as needed  Lorenza Evangelist 04/15/2012,3:27 AM

## 2012-10-04 ENCOUNTER — Other Ambulatory Visit: Payer: Self-pay | Admitting: Gastroenterology

## 2012-10-04 DIAGNOSIS — K7460 Unspecified cirrhosis of liver: Secondary | ICD-10-CM

## 2012-10-11 ENCOUNTER — Ambulatory Visit
Admission: RE | Admit: 2012-10-11 | Discharge: 2012-10-11 | Disposition: A | Discharge status: Home / Self Care | Payer: No Typology Code available for payment source | Source: Ambulatory Visit | Attending: Gastroenterology | Admitting: Gastroenterology

## 2012-10-11 DIAGNOSIS — K7460 Unspecified cirrhosis of liver: Secondary | ICD-10-CM

## 2012-10-20 ENCOUNTER — Encounter (HOSPITAL_COMMUNITY): Payer: Self-pay

## 2012-10-20 ENCOUNTER — Emergency Department (HOSPITAL_COMMUNITY)
Admission: EM | Admit: 2012-10-20 | Discharge: 2012-10-20 | Disposition: A | Discharge status: Home Health | Payer: No Typology Code available for payment source | Source: Home / Self Care

## 2012-10-20 ENCOUNTER — Telehealth (HOSPITAL_COMMUNITY): Payer: Self-pay

## 2012-10-20 DIAGNOSIS — E119 Type 2 diabetes mellitus without complications: Secondary | ICD-10-CM

## 2012-10-20 LAB — LIPID PANEL
Cholesterol: 228 mg/dL — ABNORMAL HIGH (ref 0–200)
HDL: 44 mg/dL (ref >39)
Total CHOL/HDL Ratio: 5.2 RATIO
Triglycerides: 145 mg/dL (ref <150)

## 2012-10-20 LAB — COMPREHENSIVE METABOLIC PANEL
BUN: 30 mg/dL — ABNORMAL HIGH (ref 6–23)
Calcium: 9.9 mg/dL (ref 8.4–10.5)
Creatinine, Ser: 1.16 mg/dL (ref 0.50–1.35)
GFR calc Af Amer: 76 mL/min — ABNORMAL LOW (ref >90)
Glucose, Bld: 129 mg/dL — ABNORMAL HIGH (ref 70–99)
Sodium: 137 mEq/L (ref 135–145)
Total Protein: 7.8 g/dL (ref 6.0–8.3)

## 2012-10-20 LAB — TSH: TSH: 1.685 u[IU]/mL (ref 0.350–4.500)

## 2012-10-20 MED ORDER — IMIPRAMINE HCL 50 MG PO TABS: 200.0000 mg | ORAL_TABLET | Freq: Every day | ORAL | Status: DC

## 2012-10-20 MED ORDER — FREESTYLE SYSTEM KIT: 1.0000 | PACK | Freq: Three times a day (TID) | Status: DC

## 2012-10-20 NOTE — ED Notes (Signed)
Patient has history of DM, HTN high cholestorol And some anxiety issue

## 2012-10-20 NOTE — ED Notes (Signed)
Patient Demographics  Garrett Turner, is a 62 y.o. male  ZOX:[id number]  [id number]  DOB - 27-Nov-1950  Chief Complaint  Patient presents with  . Diabetes  . Hypertension        Subjective:   Garrett Turner with history of [place],Es.varices, type 2 diabetes mellitus, hypertension and dyslipidemia comes here tp establish care, his symptom free, no headache, no chest abdominal pain, no shortness of breath, no rashes or skin bruises.  Objective:   Past Medical History  Diagnosis Date  . Diabetes mellitus   . Hypertension   . High cholesterol   . Anxiety disorder   . Pancytopenia   . Inguinal hernia     Left  . Cirrhosis   . UTI (lower urinary tract infection)   . Depression   . Anxiety   . Leg cramps       Past Surgical History  Procedure Laterality Date  . Tonsillectomy    . Esophagogastroduodenoscopy  11/04/2011    Procedure: ESOPHAGOGASTRODUODENOSCOPY (EGD);  Surgeon: Willis Modena, MD;  Location: [facility];  Service: Endoscopy;  Laterality: N/A;  . Colonoscopy  11/04/2011    Procedure: COLONOSCOPY;  Surgeon: Willis Modena, MD;  Location: [facility];  Service: Endoscopy;  Laterality: N/A;     Filed Vitals:   10/20/12 1620  BP: 123/71  Pulse: 68  Temp: 98.3 F (36.8 C)  TempSrc: Oral  Resp: 16  SpO2: 98%     Exam  Awake Alert, Oriented X 3, No new F.N deficits, Normal affect [place].AT,PERRAL Supple Neck,No JVD, No cervical lymphadenopathy appriciated.  Symmetrical Chest wall movement, Good air movement bilaterally, CTAB RRR,No Gallops,Rubs or new Murmurs, No Parasternal Heave +ve B.Sounds, Abd Soft, Non tender, No organomegaly appriciated, No rebound - guarding or rigidity. No Cyanosis, Clubbing or edema, No new Rash or bruise       Data Review   CBC No results found for this basename: WBC, HGB, HCT, PLT, MCV, MCH, MCHC, RDW, NEUTRABS, LYMPHSABS, MONOABS, EOSABS, BASOSABS, BANDABS, BANDSABD,  in the last 168 hours  Chemistries   No  results found for this basename: NA, K, CL, CO2, GLUCOSE, BUN, CREATININE, GFRCGP, CALCIUM, MG, AST, ALT, ALKPHOS, BILITOT,  in the last 168 hours ------------------------------------------------------------------------------------------------------------------ No results found for this basename: HGBA1C,  in the last 72 hours ------------------------------------------------------------------------------------------------------------------ No results found for this basename: CHOL, HDL, LDLCALC, TRIG, CHOLHDL, LDLDIRECT,  in the last 72 hours ------------------------------------------------------------------------------------------------------------------ No results found for this basename: TSH, T4TOTAL, FREET3, T3FREE, THYROIDAB,  in the last 72 hours ------------------------------------------------------------------------------------------------------------------ No results found for this basename: VITAMINB12, FOLATE, FERRITIN, TIBC, IRON, RETICCTPCT,  in the last 72 hours  Coagulation profile  No results found for this basename: INR, PROTIME,  in the last 168 hours     Prior to Admission medications   Medication Sig Start Date End Date Taking? Authorizing Provider  aspirin 81 MG tablet Take 81 mg by mouth daily.   Yes Historical Provider, MD  propranolol (INDERAL) 10 MG tablet Take 10 mg by mouth 2 (two) times daily.   Yes Historical Provider, MD  sitaGLIPtin (JANUVIA) 100 MG tablet Take 100 mg by mouth daily.   Yes Historical Provider, MD  atorvastatin (LIPITOR) 20 MG tablet Take 1 tablet (20 mg total) by mouth daily. 04/15/12   Vassie Loll, MD  betamethasone dipropionate (DIPROLENE) 0.05 % cream Apply topically 2 (two) times daily.    Historical Provider, MD  famotidine (PEPCID) 40 MG tablet Take 1 tablet (40 mg total) by mouth daily. 04/15/12  Vassie Loll, MD  glimepiride (AMARYL) 4 MG tablet Take 4 mg by mouth daily before breakfast.    Historical Provider, MD  glucose monitoring  kit (FREESTYLE) monitoring kit 1 each by Does not apply route 4 (four) times daily - after meals and at bedtime. 1 month Diabetic Testing Supplies for QAC-QHS accuchecks. 10/20/12   Leroy Sea, MD  imipramine (TOFRANIL) 50 MG tablet Take 4 tablets (200 mg total) by mouth at bedtime. 10/20/12   Leroy Sea, MD  metFORMIN (GLUCOPHAGE) 1000 MG tablet Take 1,000 mg by mouth 2 (two) times daily with a meal.    Historical Provider, MD  telmisartan-hydrochlorothiazide (MICARDIS HCT) 40-12.5 MG per tablet Take 1 tablet by mouth daily.    Historical Provider, MD     Assessment & Plan   1.NASH with Es varices - no complications, he follows with Dr. Dulce Sellar GI physician and will continue to do so. Baseline CMP and CBC will be obtained. Along with lipid panel. He is fasting.   2. DM type II. Continue home medications, patient provided with testing supplies and requested to check sugars before every meal CHS, maintain a log book and bring it next visit.  3. Dyslipidemia continue home dose statin. Fasting lipid panel will be checked today.  4. Hypertension stable on beta blocker along with ARB/HCTZ combo.   He  will be back in 2 weeks for a 60 minute and will H&P. Past records will be reviewed that visit, Baseline CBC, CMP, TSH, lipid panel and A1c will be obtained today.  Follow-up Information   Schedule an appointment as soon as possible for a visit in 2 weeks to follow up. (60 min appointment for annual H&P)        Leroy Sea M.D on 10/20/2012 at 4:34 PM   Leroy Sea, MD 10/20/12 [phone]

## 2012-10-20 NOTE — ED Notes (Signed)
Left message to return our call Zollie Beckers from lab called cbc and hgbA1c clotted Need to redraw these

## 2012-10-21 ENCOUNTER — Emergency Department (INDEPENDENT_AMBULATORY_CARE_PROVIDER_SITE_OTHER)
Admission: EM | Admit: 2012-10-21 | Discharge: 2012-10-21 | Disposition: A | Discharge status: Home / Self Care | Payer: No Typology Code available for payment source | Source: Home / Self Care

## 2012-10-21 DIAGNOSIS — E119 Type 2 diabetes mellitus without complications: Secondary | ICD-10-CM

## 2012-10-21 LAB — HEMOGLOBIN A1C
Hgb A1c MFr Bld: 9 % — ABNORMAL HIGH (ref <5.7)
Mean Plasma Glucose: 212 mg/dL — ABNORMAL HIGH (ref <117)

## 2012-10-21 LAB — CBC
HCT: 32.2 % — ABNORMAL LOW (ref 39.0–52.0)
Hemoglobin: 11.6 g/dL — ABNORMAL LOW (ref 13.0–17.0)
RBC: 3.76 MIL/uL — ABNORMAL LOW (ref 4.22–5.81)
WBC: 4.2 10*3/uL (ref 4.0–10.5)

## 2012-10-21 NOTE — ED Notes (Signed)
Patient here for bloodwork Previous tubes had clotted Repeat cbc HGBa1c

## 2012-10-24 NOTE — ED Notes (Signed)
Referral faxed to [facility] waiting on appt

## 2012-10-26 MED ORDER — AMITRIPTYLINE HCL 50 MG PO TABS: 50.0000 mg | ORAL_TABLET | Freq: Every day | ORAL | Status: DC

## 2012-10-26 NOTE — ED Notes (Signed)
Patient has an appt at [facility] 10/25/12 @4 :30

## 2012-10-26 NOTE — ED Notes (Signed)
Spoke with patient about his imipramine- which he take for anxiety depression and to sleep at night.per dr Laural Benes going to switch to amitryptyline 50 mg 1 po QHS because the original is not on the formulary at [facility]

## 2012-11-03 ENCOUNTER — Encounter (HOSPITAL_COMMUNITY): Payer: Self-pay

## 2012-11-03 ENCOUNTER — Emergency Department (HOSPITAL_COMMUNITY)
Admission: EM | Admit: 2012-11-03 | Discharge: 2012-11-03 | Disposition: A | Discharge status: Home Health | Payer: No Typology Code available for payment source | Source: Home / Self Care

## 2012-11-03 DIAGNOSIS — K7460 Unspecified cirrhosis of liver: Secondary | ICD-10-CM

## 2012-11-03 DIAGNOSIS — F341 Dysthymic disorder: Secondary | ICD-10-CM

## 2012-11-03 DIAGNOSIS — E119 Type 2 diabetes mellitus without complications: Secondary | ICD-10-CM

## 2012-11-03 DIAGNOSIS — I10 Essential (primary) hypertension: Secondary | ICD-10-CM

## 2012-11-03 MED ORDER — METFORMIN HCL 1000 MG PO TABS
1000.0000 mg | ORAL_TABLET | Freq: Two times a day (BID) | ORAL | Status: DC
Start: 2012-11-03 — End: 2012-11-03

## 2012-11-03 MED ORDER — FAMOTIDINE 40 MG PO TABS: 40.0000 mg | ORAL_TABLET | Freq: Every day | ORAL | Status: DC

## 2012-11-03 MED ORDER — PROPRANOLOL HCL 10 MG PO TABS: 10.0000 mg | ORAL_TABLET | Freq: Two times a day (BID) | ORAL | Status: DC

## 2012-11-03 MED ORDER — GLIMEPIRIDE 4 MG PO TABS: 4.0000 mg | ORAL_TABLET | Freq: Every day | ORAL | Status: DC

## 2012-11-03 MED ORDER — TELMISARTAN-HCTZ 40-12.5 MG PO TABS: 1.0000 | ORAL_TABLET | Freq: Every day | ORAL | Status: DC

## 2012-11-03 MED ORDER — ATORVASTATIN CALCIUM 20 MG PO TABS: 20.0000 mg | ORAL_TABLET | Freq: Every day | ORAL | Status: DC

## 2012-11-03 MED ORDER — TRAZODONE 25 MG HALF TABLET
25.0000 mg | ORAL_TABLET | Freq: Every day | ORAL | Status: DC
Start: 2012-11-03 — End: 2012-11-03

## 2012-11-03 MED ORDER — SITAGLIPTIN PHOSPHATE 100 MG PO TABS: 100.0000 mg | ORAL_TABLET | Freq: Every day | ORAL | Status: DC

## 2012-11-03 MED ORDER — METFORMIN HCL 1000 MG PO TABS: 1000.0000 mg | ORAL_TABLET | Freq: Two times a day (BID) | ORAL | Status: DC

## 2012-11-03 MED ORDER — TRAZODONE 25 MG HALF TABLET
25.0000 mg | ORAL_TABLET | Freq: Every day | ORAL | Status: DC
Start: 2012-11-03 — End: 2013-02-21

## 2012-11-03 NOTE — ED Notes (Signed)
Follow up- history of DM hernia

## 2012-11-03 NOTE — ED Provider Notes (Signed)
Patient Demographics  Garrett Turner, is a 62 y.o. male  ZOX:[id number]  [id number]:[id number]  DOB - 1951-02-19  Chief Complaint  Patient presents with  . Follow-up        Subjective:   Garrett Turner today is here for a follow up vist. Patient has No headache, No chest pain, No abdominal pain - No Nausea, No new weakness tingling or numbness, No Cough - SOB. No leg swelling. The abdominal swelling. Objective:    Filed Vitals:   11/03/12 1020  BP: 109/66  Pulse: 69  Temp: 97.7 F (36.5 C)  TempSrc: Oral  Resp: 17  SpO2: 100%     ALLERGIES:  No Known Allergies  PAST MEDICAL HISTORY: Past Medical History  Diagnosis Date  . Diabetes mellitus   . Hypertension   . High cholesterol   . Anxiety disorder   . Pancytopenia   . Inguinal hernia     Left  . Cirrhosis   . UTI (lower urinary tract infection)   . Depression   . Anxiety   . Leg cramps     PAST SURGICAL HISTORY: Past Surgical History  Procedure Laterality Date  . Tonsillectomy    . Esophagogastroduodenoscopy  11/04/2011    Procedure: ESOPHAGOGASTRODUODENOSCOPY (EGD);  Surgeon: Willis Modena, MD;  Location: [facility];  Service: Endoscopy;  Laterality: N/A;  . Colonoscopy  11/04/2011    Procedure: COLONOSCOPY;  Surgeon: Willis Modena, MD;  Location: [facility];  Service: Endoscopy;  Laterality: N/A;    FAMILY HISTORY: Family History  Problem Relation Age of Onset  . Coronary artery disease    . Hypertension    . Heart failure Father   . Hypertension Father   . Heart disease Father   . Heart failure Mother   . Hypertension Mother   . Heart disease Mother   . Stroke Paternal Grandmother   . Obesity Daughter     MEDICATIONS AT HOME: Prior to Admission medications   Medication Sig Start Date End Date Taking? Authorizing Provider  aspirin 81 MG tablet Take 81 mg by mouth daily.    Historical Provider, MD  atorvastatin (LIPITOR) 20 MG tablet Take 1 tablet (20 mg total) by mouth daily. 11/03/12    Fraser Busche Levora Dredge, MD  betamethasone dipropionate (DIPROLENE) 0.05 % cream Apply topically 2 (two) times daily.    Historical Provider, MD  famotidine (PEPCID) 40 MG tablet Take 1 tablet (40 mg total) by mouth daily. 11/03/12   Fanchon Papania Levora Dredge, MD  glimepiride (AMARYL) 4 MG tablet Take 1 tablet (4 mg total) by mouth daily before breakfast. 11/03/12   Maretta Bees, MD  glucose monitoring kit (FREESTYLE) monitoring kit 1 each by Does not apply route 4 (four) times daily - after meals and at bedtime. 1 month Diabetic Testing Supplies for QAC-QHS accuchecks. 10/20/12   Leroy Sea, MD  metFORMIN (GLUCOPHAGE) 1000 MG tablet Take 1 tablet (1,000 mg total) by mouth 2 (two) times daily with a meal. 11/03/12   Gaile Allmon Levora Dredge, MD  propranolol (INDERAL) 10 MG tablet Take 1 tablet (10 mg total) by mouth 2 (two) times daily. 11/03/12   Braiden Presutti Levora Dredge, MD  sitaGLIPtin (JANUVIA) 100 MG tablet Take 1 tablet (100 mg total) by mouth daily. 11/03/12   Jacey Pelc Levora Dredge, MD  telmisartan-hydrochlorothiazide (MICARDIS HCT) 40-12.5 MG per tablet Take 1 tablet by mouth daily. 11/03/12   Tammie Yanda Levora Dredge, MD  traZODone (DESYREL) 25 mg TABS Take 0.5 tablets (25 mg total) by mouth at bedtime.  11/03/12   Julicia Krieger Levora Dredge, MD    REVIEW OF SYSTEMS:  Constitutional:   No   Fevers, chills, fatigue.  HEENT:    No headaches, Sore throat,   Cardio-vascular: No chest pain,  Orthopnea, swelling in lower extremities, anasarca, palpitations  GI:  No abdominal pain, nausea, vomiting, diarrhea  Resp: No shortness of breath,  No coughing up of blood.No cough.No wheezing.  Skin:  no rash or lesions.  GU:  no dysuria, change in color of urine, no urgency or frequency.  No flank pain.  Musculoskeletal: No joint pain or swelling.  No decreased range of motion.  No back pain.  Psych: No change in mood or affect. No depression or anxiety.  No memory loss.   Exam  General appearance :Awake, alert, not in any  distress. Speech Clear. Not toxic Looking HEENT: Atraumatic and Normocephalic, pupils equally reactive to light and accomodation Neck: supple, no JVD. No cervical lymphadenopathy.  Chest:Good air entry bilaterally, no added sounds  CVS: S1 S2 regular, no murmurs.  Abdomen: Bowel sounds present, Non tender and not distended with no gaurding, rigidity or rebound. Extremities: B/L Lower Ext shows no edema, both legs are warm to touch Neurology: Awake alert, and oriented X 3, CN II-XII intact, Non focal Skin:No Rash Wounds:N/A    Data Review   CBC No results found for this basename: WBC, HGB, HCT, PLT, MCV, MCH, MCHC, RDW, NEUTRABS, LYMPHSABS, MONOABS, EOSABS, BASOSABS, BANDABS, BANDSABD,  in the last 168 hours  Chemistries   No results found for this basename: NA, K, CL, CO2, GLUCOSE, BUN, CREATININE, GFRCGP, CALCIUM, MG, AST, ALT, ALKPHOS, BILITOT,  in the last 168 hours ------------------------------------------------------------------------------------------------------------------ No results found for this basename: HGBA1C,  in the last 72 hours ------------------------------------------------------------------------------------------------------------------ No results found for this basename: CHOL, HDL, LDLCALC, TRIG, CHOLHDL, LDLDIRECT,  in the last 72 hours ------------------------------------------------------------------------------------------------------------------ No results found for this basename: TSH, T4TOTAL, FREET3, T3FREE, THYROIDAB,  in the last 72 hours ------------------------------------------------------------------------------------------------------------------ No results found for this basename: VITAMINB12, FOLATE, FERRITIN, TIBC, IRON, RETICCTPCT,  in the last 72 hours  Coagulation profile  No results found for this basename: INR, PROTIME,  in the last 168 hours    Assessment & Plan   Diabetes mellitus - A1c is 9. - Continue with metformin, Januvia and  Amaryl. Repeat A1c in 3 months-if continues to his needs to be placed on Lantus. - Although A1c is 9, has up with them the recordings of over the past 2 weeks done 3-4 times a day-to only 2 readings above 200- therefore would continue with current regimen and reevaluate in 3 months  Hypertension - Stable - Continue with current regimen  Dyslipidemia - Given history of liver cirrhosis-would not be very aggressive with statins and other medications. - Continue with Lipitor - Recheck LDL in 3 months  Liver cirrhosis - Follows up with Dr. Dulce Sellar GI- defer treatment to GI - But very well compensated, with no edema-reassess need to add Aldactone on next visit  Anxiety/insomnia - Was given amitriptyline last visit-claims not working-will try low dose trazodone.  Inguinal hernia - Follows up with Dr. Daphine Deutscher [facility]-deferred this issue to Dr. Daphine Deutscher.    Follow-up Information   Follow up with [facility]. Schedule an appointment as soon as possible for a visit in 3 weeks.      Maretta Bees, MD 11/03/12 1053

## 2012-12-30 NOTE — Progress Notes (Signed)
Quick Note:  Please hava patient come back for high cholesterol ______

## 2013-01-04 ENCOUNTER — Telehealth: Payer: Self-pay | Admitting: *Deleted

## 2013-01-05 ENCOUNTER — Telehealth: Payer: Self-pay | Admitting: *Deleted

## 2013-01-05 NOTE — Telephone Encounter (Signed)
Patient not available message left via telephone of high cholesterol.and need to  Call and schedule appointment for f/u P.[facility] BSN [facility]

## 2013-01-10 ENCOUNTER — Ambulatory Visit: Payer: No Typology Code available for payment source | Attending: Family Medicine | Admitting: Internal Medicine

## 2013-01-10 VITALS — BP 153/85 | HR 58 | Temp 97.9°F | Ht 73.75 in | Wt 203.8 lb

## 2013-01-10 DIAGNOSIS — I10 Essential (primary) hypertension: Secondary | ICD-10-CM

## 2013-01-10 MED ORDER — LORAZEPAM 1 MG PO TABS: 1.0000 mg | ORAL_TABLET | Freq: Every evening | ORAL | Status: DC | PRN

## 2013-01-10 NOTE — Patient Instructions (Signed)

## 2013-01-10 NOTE — Progress Notes (Signed)
Patient ID: Garrett Turner, male   DOB: 15-Jun-1951, 62 y.o.   MRN: [id number]  CC: medication review  HPI: Pt is 62 yo male who presents to clinic for review of his medications and explains he is not really willing to take cholesterol medication if not needed as he has some side effects> he is unable to describe exactely the side effects but says he feels different. He denies chest pain or shortness of breath, no abdominal or urinary concerns, no specific neurological concerns.   No Known Allergies Past Medical History  Diagnosis Date  . Diabetes mellitus   . Hypertension   . High cholesterol   . Anxiety disorder   . Pancytopenia   . Inguinal hernia     Left  . Cirrhosis   . UTI (lower urinary tract infection)   . Depression   . Anxiety   . Leg cramps    Current Outpatient Prescriptions on File Prior to Visit  Medication Sig Dispense Refill  . aspirin 81 MG tablet Take 81 mg by mouth daily.      Marland Kitchen atorvastatin (LIPITOR) 20 MG tablet Take 1 tablet (20 mg total) by mouth daily.  30 tablet  1  . glimepiride (AMARYL) 4 MG tablet Take 1 tablet (4 mg total) by mouth daily before breakfast.  60 tablet  0  . glucose monitoring kit (FREESTYLE) monitoring kit 1 each by Does not apply route 4 (four) times daily - after meals and at bedtime. 1 month Diabetic Testing Supplies for QAC-QHS accuchecks.  1 each  1  . metFORMIN (GLUCOPHAGE) 1000 MG tablet Take 1 tablet (1,000 mg total) by mouth 2 (two) times daily with a meal.  60 tablet  0  . betamethasone dipropionate (DIPROLENE) 0.05 % cream Apply topically 2 (two) times daily.      . famotidine (PEPCID) 40 MG tablet Take 1 tablet (40 mg total) by mouth daily.  30 tablet  1  . propranolol (INDERAL) 10 MG tablet Take 1 tablet (10 mg total) by mouth 2 (two) times daily.  60 tablet  0  . sitaGLIPtin (JANUVIA) 100 MG tablet Take 1 tablet (100 mg total) by mouth daily.  30 tablet  0  . telmisartan-hydrochlorothiazide (MICARDIS HCT) 40-12.5 MG per  tablet Take 1 tablet by mouth daily.  30 tablet  0  . traZODone (DESYREL) 25 mg TABS Take 0.5 tablets (25 mg total) by mouth at bedtime.  30 tablet  0  . [DISCONTINUED] amitriptyline (ELAVIL) 50 MG tablet Take 1 tablet (50 mg total) by mouth at bedtime.  30 tablet  2  . [DISCONTINUED] imipramine (TOFRANIL) 50 MG tablet Take 4 tablets (200 mg total) by mouth at bedtime.  30 tablet  1   No current facility-administered medications on file prior to visit.   Family History  Problem Relation Age of Onset  . Coronary artery disease    . Hypertension    . Heart failure Father   . Hypertension Father   . Heart disease Father   . Heart failure Mother   . Hypertension Mother   . Heart disease Mother   . Stroke Paternal Grandmother   . Obesity Daughter    History   Social History  . Marital Status: Married    Spouse Name: N/A    Number of Children: N/A  . Years of Education: N/A   Occupational History  . Not on file.   Social History Main Topics  . Smoking status: Never Smoker   .  Smokeless tobacco: Former Neurosurgeon  . Alcohol Use: 1.1 oz/week    1 Glasses of wine, 1 Drinks containing 0.5 oz of alcohol per week  . Drug Use: No  . Sexually Active: Not Currently   Other Topics Concern  . Not on file   Social History Narrative  . No narrative on file    Review of Systems  Constitutional: Negative for fever, chills, diaphoresis, activity change, appetite change and fatigue.  HENT: Negative for ear pain, nosebleeds, congestion, facial swelling, rhinorrhea, neck pain, neck stiffness and ear discharge.   Eyes: Negative for pain, discharge, redness, itching and visual disturbance.  Respiratory: Negative for cough, choking, chest tightness, shortness of breath, wheezing and stridor.   Cardiovascular: Negative for chest pain, palpitations and leg swelling.  Gastrointestinal: Negative for abdominal distention.  Genitourinary: Negative for dysuria, urgency, frequency, hematuria, flank pain,  decreased urine volume, difficulty urinating and dyspareunia.  Musculoskeletal: Negative for back pain, joint swelling, arthralgias and gait problem.  Neurological: Negative for dizziness, tremors, seizures, syncope, facial asymmetry, speech difficulty, weakness, light-headedness, numbness and headaches.  Hematological: Negative for adenopathy. Does not bruise/bleed easily.  Psychiatric/Behavioral: Negative for hallucinations, behavioral problems, confusion, dysphoric mood, decreased concentration and agitation.    Objective:   Filed Vitals:   01/10/13 1154  BP: 153/85  Pulse: 58  Temp: 97.9 F (36.6 C)    Physical Exam  Constitutional: Appears well-developed and well-nourished. No distress.  CVS: RRR, S1/S2 +, no murmurs, no gallops, no carotid bruit.  Pulmonary: Effort and breath sounds normal, no stridor, rhonchi, wheezes, rales.  Abdominal: Soft. BS +,  no distension, tenderness, rebound or guarding.  Musculoskeletal: Normal range of motion. No edema and no tenderness.  Lymphadenopathy: No lymphadenopathy noted, cervical, inguinal. Psychiatric: Normal mood and affect. Behavior, judgment, thought content normal.   Lab Results  Component Value Date   WBC 4.2 10/21/2012   HGB 11.6* 10/21/2012   HCT 32.2* 10/21/2012   MCV 85.6 10/21/2012   PLT 56* 10/21/2012   Lab Results  Component Value Date   CREATININE 1.16 10/20/2012   BUN 30* 10/20/2012   NA 137 10/20/2012   K 4.3 10/20/2012   CL 106 10/20/2012   CO2 20 10/20/2012    Lab Results  Component Value Date   HGBA1C 9.0* 10/21/2012   Lipid Panel     Component Value Date/Time   CHOL 228* 10/20/2012 1628   TRIG 145 10/20/2012 1628   HDL 44 10/20/2012 1628   CHOLHDL 5.2 10/20/2012 1628   VLDL 29 10/20/2012 1628   LDLCALC 155* 10/20/2012 1628       Assessment and plan:   Patient Active Problem List   Diagnosis Date Noted  . GERD (gastroesophageal reflux disease) - stable, dietary recommendations provided  04/13/2012  . Thrombocytopenia -  chronic and pt aware of it, no need for transfusion as no active bleeding  04/13/2012  . Depression with anxiety - stable and at baseline  04/13/2012  . DM (diabetes mellitus) - continue current regimen, discussed exercise and dietary recommendations provided  06/14/2011

## 2013-02-14 ENCOUNTER — Other Ambulatory Visit (HOSPITAL_COMMUNITY): Payer: Self-pay | Admitting: Internal Medicine

## 2013-02-16 ENCOUNTER — Telehealth: Payer: Self-pay | Admitting: Family Medicine

## 2013-02-16 NOTE — Telephone Encounter (Signed)
Medication refill

## 2013-02-16 NOTE — Telephone Encounter (Signed)
Pt says he would like refill for several meds including metformin.  Pt was advised that would need to be seen by Dr since scripts were written back in April but pt says that he came in on 01/10/13 and discussed meds with Dr. Please f/u with pt.

## 2013-02-17 NOTE — Telephone Encounter (Signed)
Pt called again

## 2013-02-20 NOTE — Telephone Encounter (Signed)
Please call patient and find out specifically what medications that he needs to have refilled.    Rodney Langton, MD, CDE, FAAFP [facility] [place], [place]

## 2013-02-21 ENCOUNTER — Telehealth: Payer: Self-pay | Admitting: Family Medicine

## 2013-02-21 MED ORDER — GLIMEPIRIDE 4 MG PO TABS: 4.0000 mg | ORAL_TABLET | Freq: Every day | ORAL | Status: DC

## 2013-02-21 MED ORDER — TRAZODONE 25 MG HALF TABLET: 25.0000 mg | ORAL_TABLET | Freq: Every day | ORAL | Status: DC

## 2013-02-21 MED ORDER — ATORVASTATIN CALCIUM 20 MG PO TABS: 20.0000 mg | ORAL_TABLET | Freq: Every day | ORAL | Status: DC

## 2013-02-21 MED ORDER — FAMOTIDINE 40 MG PO TABS: 40.0000 mg | ORAL_TABLET | Freq: Every day | ORAL | Status: DC

## 2013-02-21 MED ORDER — TELMISARTAN-HCTZ 40-12.5 MG PO TABS: 1.0000 | ORAL_TABLET | Freq: Every day | ORAL | Status: DC

## 2013-02-21 MED ORDER — METFORMIN HCL 1000 MG PO TABS: 1000.0000 mg | ORAL_TABLET | Freq: Two times a day (BID) | ORAL | Status: DC

## 2013-02-21 MED ORDER — PROPRANOLOL HCL 10 MG PO TABS: 10.0000 mg | ORAL_TABLET | Freq: Two times a day (BID) | ORAL | Status: DC

## 2013-02-21 NOTE — Telephone Encounter (Signed)
Pt called requesting med refills.  Refills sent to [facility] on record for patient.    Maryln Manuel, MD

## 2013-02-21 NOTE — Telephone Encounter (Signed)
Pt needs to have medications refilled; pt has run out of medication

## 2013-02-21 NOTE — Telephone Encounter (Signed)
Please tell patient that his refills have already been sent to [facility] earlier today.

## 2013-02-21 NOTE — Telephone Encounter (Signed)
Pt called to get a refill for metphormin & other diabetes medication;pt is out of medication;  pt sounded very fatigued on the phone

## 2013-02-21 NOTE — Telephone Encounter (Signed)
Please inform patient that refills sent to [facility] which is on record for him in our computer system.   Rodney Langton, MD, CDE, FAAFP [facility] [place], [place]

## 2013-02-23 ENCOUNTER — Telehealth: Payer: Self-pay | Admitting: *Deleted

## 2013-02-23 NOTE — Telephone Encounter (Signed)
02/23/13 Patient not available message left via telephone that his refills have already  Been sent to [facility] for him to pick up. P.Kara Melching,RN BSN MHA

## 2013-02-27 ENCOUNTER — Telehealth: Payer: Self-pay

## 2013-02-27 NOTE — ED Notes (Signed)
Call from pharmacy, asking for cheaper medications. Informed he is w the adult care clinic, given their number for contacts

## 2013-02-27 NOTE — Telephone Encounter (Signed)
Called pt and informed that medications were sent to [facility].

## 2013-02-27 NOTE — Telephone Encounter (Signed)
Pharmacy call Patient can not afford micardis Is there something else we can send to pharmacy that he can afford He uses [facility] [phone]

## 2013-04-03 ENCOUNTER — Ambulatory Visit: Payer: No Typology Code available for payment source | Attending: Internal Medicine

## 2013-04-12 ENCOUNTER — Ambulatory Visit: Payer: Self-pay | Admitting: Internal Medicine

## 2013-04-12 ENCOUNTER — Telehealth: Payer: Self-pay | Admitting: Emergency Medicine

## 2013-04-12 ENCOUNTER — Other Ambulatory Visit: Payer: Self-pay | Admitting: Internal Medicine

## 2013-04-12 ENCOUNTER — Telehealth: Payer: Self-pay | Admitting: Internal Medicine

## 2013-04-12 MED ORDER — LOSARTAN POTASSIUM-HCTZ 50-12.5 MG PO TABS: 1.0000 | ORAL_TABLET | Freq: Every day | ORAL | Status: DC

## 2013-04-12 NOTE — Telephone Encounter (Signed)
Please change pt bp medication Telmisartan-HCTZ 40-12.5 mg due to cost. Pt is out of BP meds.

## 2013-04-12 NOTE — Telephone Encounter (Signed)
Losartan/hydrochlorothiazide 50/12.5 mg tablet by mouth once daily, 90 tablets with 2 refills called in. Please let patient know to go pick it up at [facility]

## 2013-04-12 NOTE — Telephone Encounter (Signed)
Pt would like to get refills on medications due to cancelled appt. by provider;

## 2013-04-12 NOTE — Telephone Encounter (Signed)
Pt keeps calling wanting medication changed. Unable to afford Telmisartan-HCTZ 40-12.5 mg. Please respond. Pt is out of bp meds

## 2013-04-13 ENCOUNTER — Telehealth: Payer: Self-pay | Admitting: Emergency Medicine

## 2013-04-13 NOTE — Telephone Encounter (Signed)
Information given to Dr. Laural Benes. Inform pt I will call him after order given.

## 2013-04-13 NOTE — Telephone Encounter (Signed)
Pt called regarding script for BP meds Pt informed to pick up from [facility]

## 2013-04-14 NOTE — Telephone Encounter (Signed)
Scripts already reordered by Dr. Hyman Hopes. Pt may pick them up @ [facility]

## 2013-04-19 ENCOUNTER — Ambulatory Visit: Payer: Self-pay

## 2013-04-21 IMAGING — CT CT CERVICAL SPINE W/O CM
4 of 6 series · 13 of 33 positions shown, 15 images · non-contrast
Comparison: None

CT HEAD

CLINICAL DATA: 60-year-old male with headache and neck pain
following fall.

CT HEAD WITHOUT CONTRAST
CT CERVICAL SPINE WITHOUT CONTRAST
TECHNIQUE: Multidetector CT imaging of the head and cervical spine
was performed following the standard protocol without intravenous
contrast.  Multiplanar CT image reconstructions of the cervical
spine were also generated.

[Series 3: c-spine st · axial · 0.25mm/px · z∈[-222,-164]mm · 2 of 87 slices shown]
[im 29/87  bone]
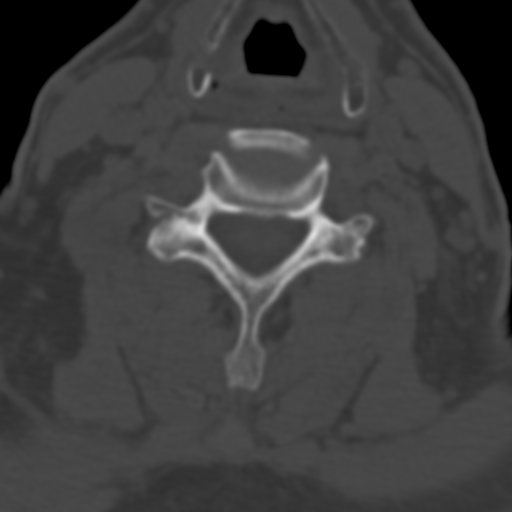
[im 58/87  bone]
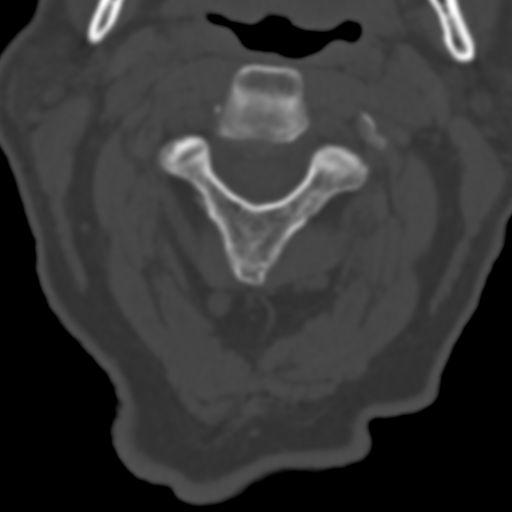

[Series 8: axial reformats · axial · 0.23mm/px · z∈[-267,-191]mm · 3 of 90 slices shown, 4 images]
[im 23/90  soft-tissue]
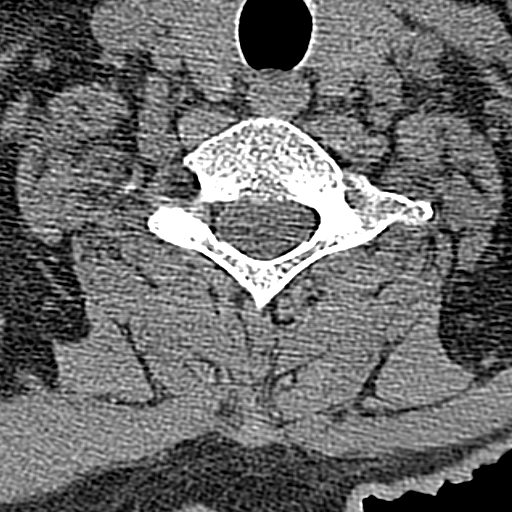
[im 23/90  bone]
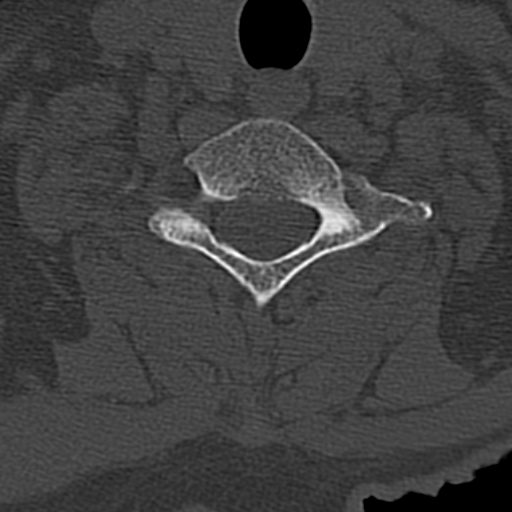
[im 45/90  bone]
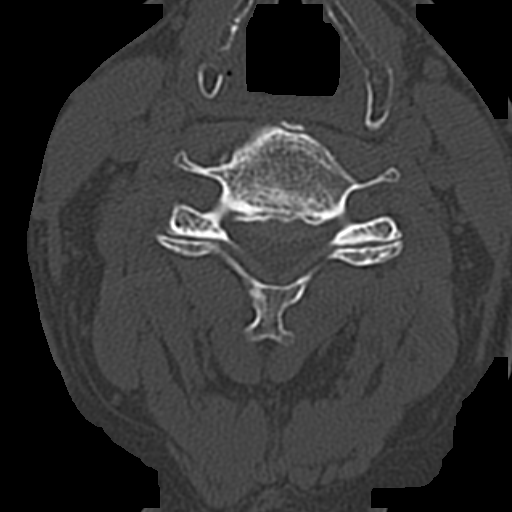
[im 67/90  bone]
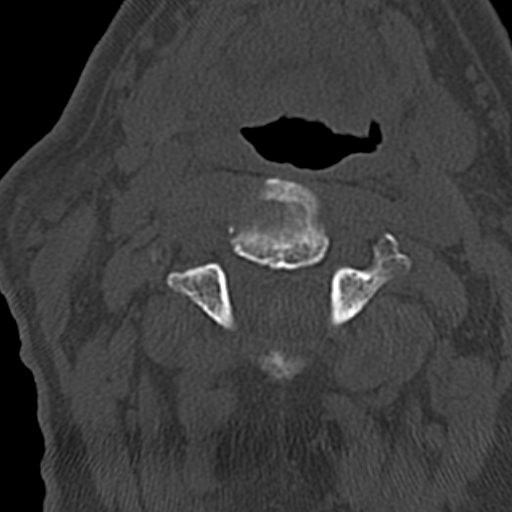

[Series 9: coronal · coronal · 0.25mm/px · 3 of 41 slices shown]
[im 9/41  bone]
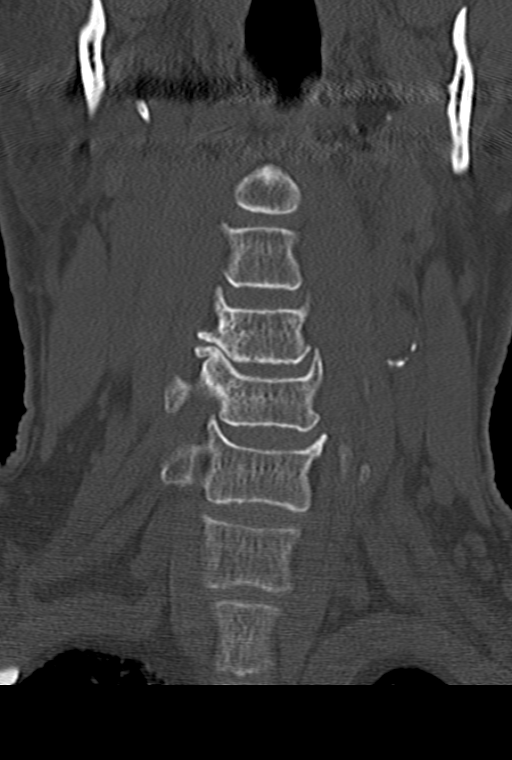
[im 17/41  bone]
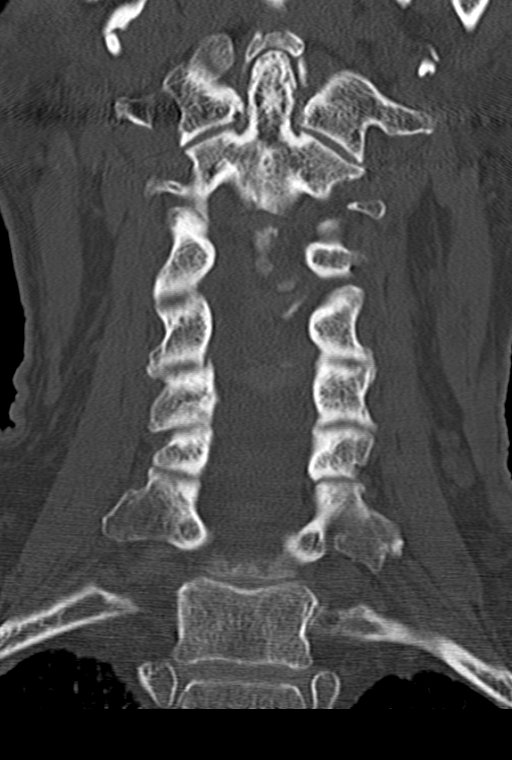
[im 25/41  bone]
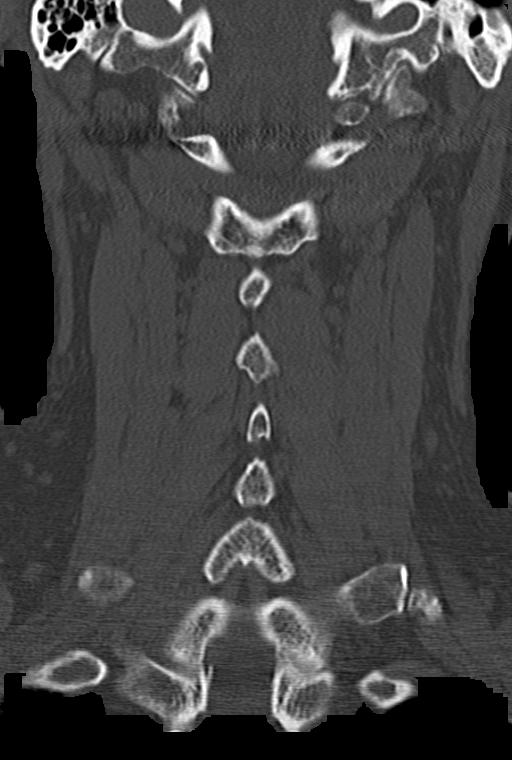

[Series 10: sagittal · sagittal · 0.25mm/px · 5 of 36 slices shown, 6 images]
[im 12/36  bone]
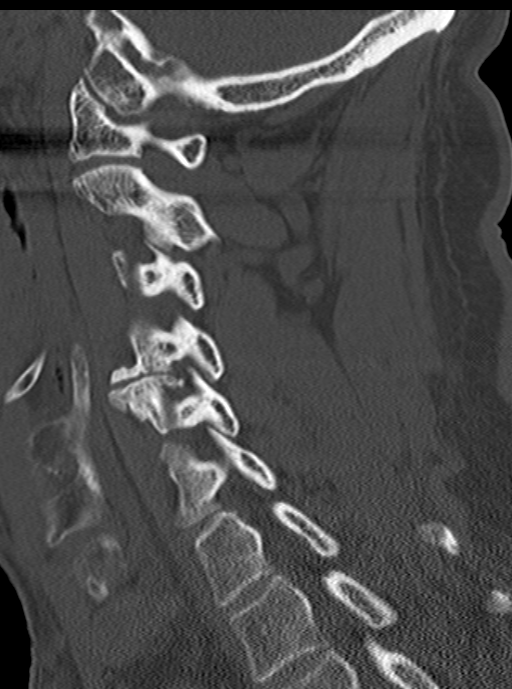
[im 15/36  bone]
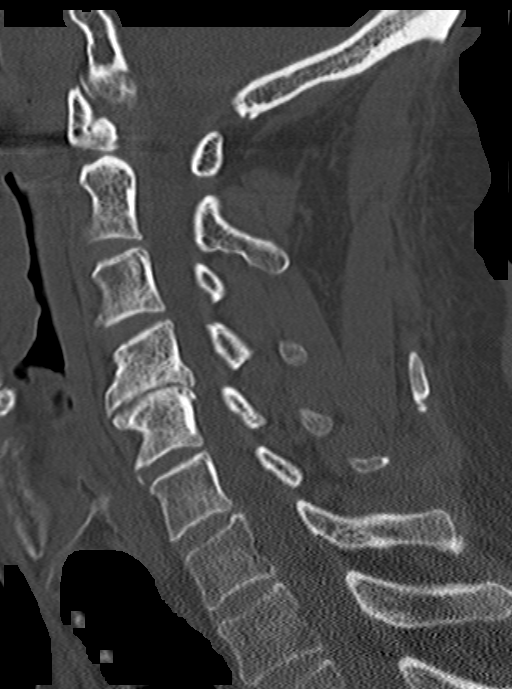
[im 18/36  soft-tissue]
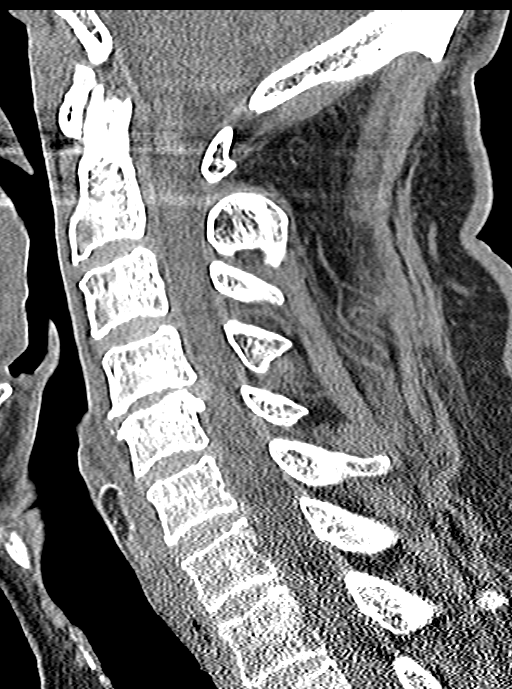
[im 18/36  bone]
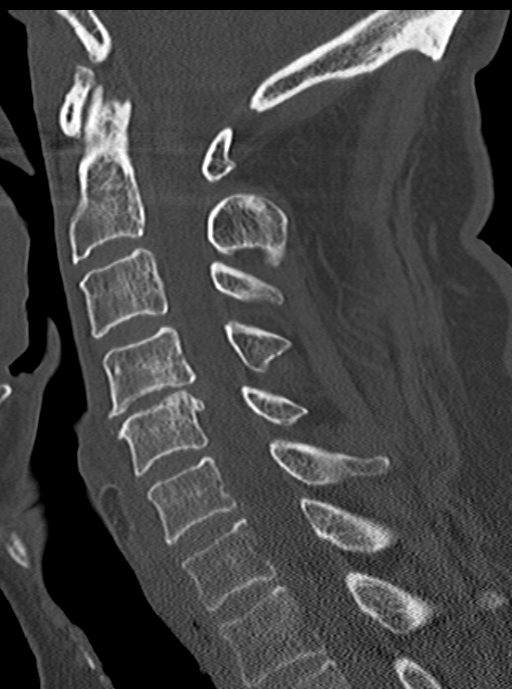
[im 21/36  bone]
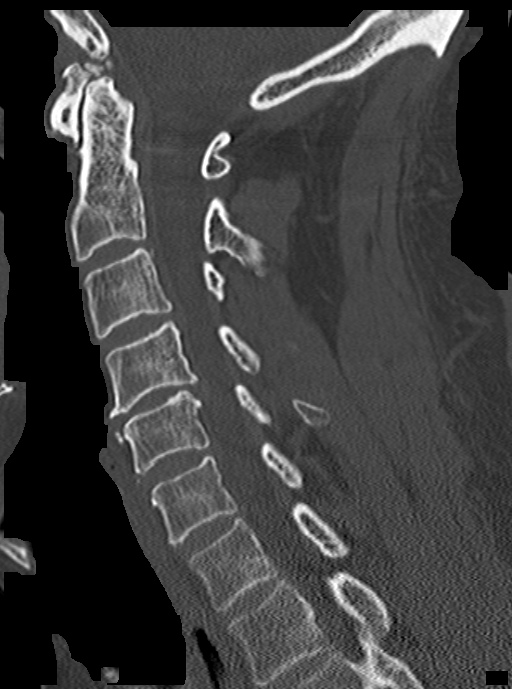
[im 24/36  bone]
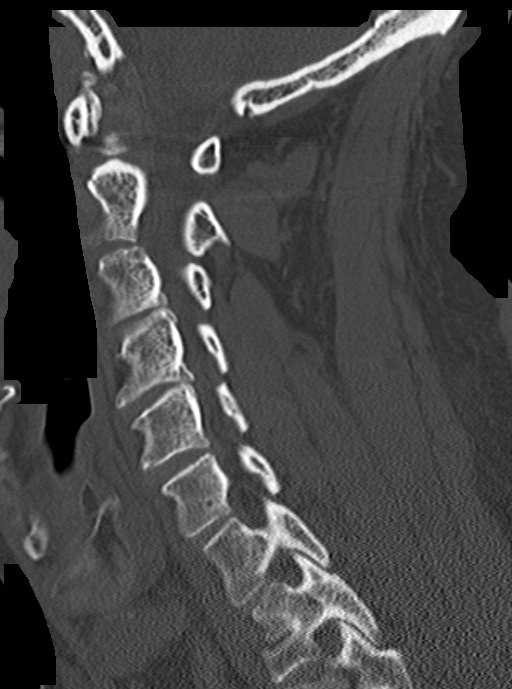

[13 of 33 positions shown; findings below may reference images not displayed]

FINDINGS: No acute intracranial abnormalities are identified,
including mass lesion or mass effect, hydrocephalus, extra-axial
fluid collection, midline shift, hemorrhage, or acute infarction.

The visualized bony calvarium is unremarkable.
IMPRESSION: No evidence of acute intracranial abnormality.

CT CERVICAL SPINE
FINDINGS: Normal alignment is noted.
There is no evidence of fracture, subluxation or prevertebral soft
tissue swelling.
Moderate degenerative disc disease, spondylosis and broad-based
disc bulge at C4-C5 noted contributing to moderate central spinal
and biforaminal narrowing.
The disc spaces are otherwise maintained.
No focal bony lesions are noted.
No soft tissue abnormalities are noted.
IMPRESSION: No static evidence of acute injury to the cervical spine.

Moderate degenerative changes at C4-C5 contributing to moderate
central spinal biforaminal narrowing.

## 2013-04-24 ENCOUNTER — Ambulatory Visit: Payer: No Typology Code available for payment source | Attending: Internal Medicine | Admitting: Internal Medicine

## 2013-04-24 VITALS — BP 118/68 | HR 66 | Resp 18

## 2013-04-24 DIAGNOSIS — E119 Type 2 diabetes mellitus without complications: Secondary | ICD-10-CM

## 2013-04-24 DIAGNOSIS — E1149 Type 2 diabetes mellitus with other diabetic neurological complication: Secondary | ICD-10-CM

## 2013-04-24 DIAGNOSIS — Z23 Encounter for immunization: Secondary | ICD-10-CM

## 2013-04-24 LAB — POCT GLYCOSYLATED HEMOGLOBIN (HGB A1C): Hemoglobin A1C: 6.3

## 2013-04-24 MED ORDER — TAMSULOSIN HCL 0.4 MG PO CAPS: 0.4000 mg | ORAL_CAPSULE | Freq: Every day | ORAL | Status: DC

## 2013-04-24 NOTE — Progress Notes (Signed)
Pt is here for a f/u on DM Also wants a flu shot and would like to talk to PCP about hernia He is alert w/no signs of acute distress.

## 2013-04-24 NOTE — Progress Notes (Signed)
Patient ID: Garrett Turner, male   DOB: [birth year], 62 y.o.   MRN: [id number]  Patient Demographics  Garrett Turner, is a 62 y.o. male  EXB:[id number]  NUU:[id number]  DOB - 1951/06/11  Chief Complaint  Patient presents with  . 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 Dulce Sellar for his GI problems is here for routine followup visit, no new subjective complaints. He does have some loss of fine sensation in his feet and in his right hand for the last 8-10 years. He does complain of some difficulty in starting his urinary stream which has been ongoing for the last few years.  Denies any subjective complaints except as above, no active headache, no chest abdominal pain at this time, not short of breath. No focal weakness which is new.    Objective:    Patient Active Problem List   Diagnosis Date Noted  . Type II or unspecified type diabetes mellitus with neurological manifestations, not stated as uncontrolled(250.60) 04/24/2013  . Left leg cellulitis 04/13/2012  . Lactic acid acidosis 04/13/2012  . Sepsis(995.91) 04/13/2012  . GERD (gastroesophageal reflux disease) 04/13/2012  . Cirrhosis of liver 04/13/2012  . Thrombocytopenia 04/13/2012  . Depression with anxiety 04/13/2012  . Bilateral inguinal hernia-containing fat and ascites 09/11/2011  . UTI (lower urinary tract infection) 06/16/2011  . Intractable pain 06/14/2011  . Fall 06/14/2011  . DM (diabetes mellitus) 06/14/2011  . HTN (hypertension) 06/14/2011  . Hyperlipidemia 06/14/2011  . Cirrhosis 06/14/2011     Filed Vitals:   04/24/13 0937  BP: 118/68  Pulse: 66  Resp: 18  SpO2: 98%     Exam   Awake Alert, Oriented X 3, No new F.N deficits, Normal affect [place].AT,PERRAL Supple Neck,No JVD, No cervical lymphadenopathy  appriciated.  Symmetrical Chest wall movement, Good air movement bilaterally, CTAB RRR,No Gallops,Rubs or new Murmurs, No Parasternal Heave +ve B.Sounds, Abd Soft, Non tender, No organomegaly appriciated, No rebound - guarding or rigidity. No Cyanosis, Clubbing or edema, No new Rash or bruise        Data Review   CBC No results found for this basename: WBC, HGB, HCT, PLT, MCV, MCH, MCHC, RDW, NEUTRABS, LYMPHSABS, MONOABS, EOSABS, BASOSABS, BANDABS, BANDSABD,  in the last 168 hours  Chemistries   No results found for this basename: NA, K, CL, CO2, GLUCOSE, BUN, CREATININE, GFRCGP, CALCIUM, MG, AST, ALT, ALKPHOS, BILITOT,  in the last 168 hours ------------------------------------------------------------------------------------------------------------------  Recent Labs  04/24/13 0949  HGBA1C 6.3   ------------------------------------------------------------------------------------------------------------------ No results found for this basename: CHOL, HDL, LDLCALC, TRIG, CHOLHDL, LDLDIRECT,  in the last 72 hours ------------------------------------------------------------------------------------------------------------------ No results found for this basename: TSH, T4TOTAL, FREET3, T3FREE, THYROIDAB,  in the last 72 hours ------------------------------------------------------------------------------------------------------------------ No results found for this basename: VITAMINB12, FOLATE, FERRITIN, TIBC, IRON, RETICCTPCT,  in the last 72 hours  Coagulation profile  No results found for this basename: INR, PROTIME,  in the last 168 hours     Prior to Admission medications   Medication Sig Start Date End Date Taking? Authorizing Provider  aspirin 81 MG tablet Take 81 mg by mouth daily.   Yes Historical Provider, MD  atorvastatin (LIPITOR) 20 MG tablet Take 1 tablet (20 mg total) by mouth daily. 02/21/13  Yes Clanford Cyndie Mull, MD  famotidine (PEPCID) 40 MG tablet Take 1 tablet  (40 mg total)  by mouth daily. 02/21/13  Yes Clanford Cyndie Mull, MD  glimepiride (AMARYL) 4 MG tablet Take 1 tablet (4 mg total) by mouth daily before breakfast. 02/21/13  Yes Clanford Cyndie Mull, MD  losartan-hydrochlorothiazide (HYZAAR) 50-12.5 MG per tablet Take 1 tablet by mouth daily. 04/12/13  Yes Jeanann Lewandowsky, MD  metFORMIN (GLUCOPHAGE) 1000 MG tablet TAKE ONE TABLET BY MOUTH TWICE DAILY WITH MEALS 02/14/13  Yes Dorothea Ogle, MD  propranolol (INDERAL) 10 MG tablet Take 1 tablet (10 mg total) by mouth 2 (two) times daily. 02/21/13  Yes Clanford Cyndie Mull, MD  sitaGLIPtin (JANUVIA) 100 MG tablet Take 1 tablet (100 mg total) by mouth daily. 11/03/12  Yes Shanker Levora Dredge, MD  traZODone (DESYREL) 25 mg TABS tablet Take 0.5 tablets (25 mg total) by mouth at bedtime. 02/21/13  Yes Clanford Cyndie Mull, MD  betamethasone dipropionate (DIPROLENE) 0.05 % cream Apply topically 2 (two) times daily.    Historical Provider, MD  glucose monitoring kit (FREESTYLE) monitoring kit 1 each by Does not apply route 4 (four) times daily - after meals and at bedtime. 1 month Diabetic Testing Supplies for QAC-QHS accuchecks. 10/20/12   Leroy Sea, MD  LORazepam (ATIVAN) 1 MG tablet Take 1 tablet (1 mg total) by mouth at bedtime as needed (sleep). 01/10/13   Dorothea Ogle, MD  metFORMIN (GLUCOPHAGE) 1000 MG tablet Take 1 tablet (1,000 mg total) by mouth 2 (two) times daily with a meal. 02/21/13   Clanford Cyndie Mull, MD     Assessment & Plan    DM type II with diabetic neuropathy which has been present for now close to 8-10 years. Glycemic control is acceptable, A1c was 6.3. Continue present regimen. Foot exam is stable. Eye exam is scheduled for today. Neuropathy is mild. He does have mild loss of sensation in his feet. Counseled on wearing shoes at all times and to inspect his shoes before wearing them. We'll check a B12 and TSH to rule out other causes of peripheral neuropathy.   Left inguinal hernia. Refer to Gen.  surgery. It's easily reducible. No pain or tenderness at that site.    Hypertension stable on present regimen continue.   Dyslipidemia on statin which will be continued.   Symptoms of BPH. Place on Flomax, check PSA. If PSA is high he will be referred to urology.     He will come back in 3 months to get a repeat A1c and for annual history and physical   Routine health maintenance.  Pneumovax and Flu shot given, he is up-to-date on his tetanus shot.  Colonoscopy done 4 years ago by Dr. Dulce Sellar is stable.  Eye exam is scheduled for today evening   Leroy Sea M.D on 04/24/2013 at 9:56 AM

## 2013-04-25 LAB — PSA: PSA: 0.26 ng/mL (ref <=4.00)

## 2013-05-03 ENCOUNTER — Telehealth: Payer: Self-pay | Admitting: Emergency Medicine

## 2013-05-03 NOTE — Telephone Encounter (Signed)
Pt given lab results 

## 2013-06-06 ENCOUNTER — Emergency Department (INDEPENDENT_AMBULATORY_CARE_PROVIDER_SITE_OTHER): Payer: No Typology Code available for payment source

## 2013-06-06 ENCOUNTER — Encounter (HOSPITAL_COMMUNITY): Payer: Self-pay | Admitting: Emergency Medicine

## 2013-06-06 ENCOUNTER — Emergency Department (HOSPITAL_COMMUNITY)
Admission: EM | Admit: 2013-06-06 | Discharge: 2013-06-06 | Disposition: A | Discharge status: Home / Self Care | Payer: No Typology Code available for payment source | Source: Home / Self Care | Attending: Family Medicine | Admitting: Family Medicine

## 2013-06-06 DIAGNOSIS — S20219A Contusion of unspecified front wall of thorax, initial encounter: Secondary | ICD-10-CM

## 2013-06-06 DIAGNOSIS — J189 Pneumonia, unspecified organism: Secondary | ICD-10-CM

## 2013-06-06 DIAGNOSIS — M25569 Pain in unspecified knee: Secondary | ICD-10-CM

## 2013-06-06 DIAGNOSIS — K4020 Bilateral inguinal hernia, without obstruction or gangrene, not specified as recurrent: Secondary | ICD-10-CM

## 2013-06-06 DIAGNOSIS — S20211A Contusion of right front wall of thorax, initial encounter: Secondary | ICD-10-CM

## 2013-06-06 DIAGNOSIS — M25562 Pain in left knee: Secondary | ICD-10-CM

## 2013-06-06 MED ORDER — AZITHROMYCIN 250 MG PO TABS: 250.0000 mg | ORAL_TABLET | Freq: Every day | ORAL | Status: DC

## 2013-06-06 MED ORDER — METHYLPREDNISOLONE ACETATE 40 MG/ML IJ SUSP
40.0000 mg | Freq: Once | INTRAMUSCULAR | Status: AC
  Administered 2013-06-06: 40 mg via INTRA_ARTICULAR

## 2013-06-06 MED ORDER — METHYLPREDNISOLONE ACETATE 40 MG/ML IJ SUSP
INTRAMUSCULAR | Status: AC
  Filled 2013-06-06: qty 5

## 2013-06-06 MED ORDER — AMOXICILLIN 500 MG PO CAPS: 1000.0000 mg | ORAL_CAPSULE | Freq: Three times a day (TID) | ORAL | Status: DC

## 2013-06-06 MED ORDER — TRAMADOL HCL 50 MG PO TABS: 50.0000 mg | ORAL_TABLET | Freq: Two times a day (BID) | ORAL | Status: DC | PRN

## 2013-06-06 MED ORDER — METHYLPREDNISOLONE ACETATE 40 MG/ML IJ SUSP
40.0000 mg | Freq: Once | INTRAMUSCULAR | Status: AC
  Administered 2013-06-06: 40 mg via INTRAMUSCULAR

## 2013-06-06 NOTE — ED Notes (Signed)
Dr. Denyse Amass is speaking to pt Pt c/o inguinal hernia onset 9 months Has become more painful in the last few days Also states last Tuesday he sustained a fall and chest is hurting Pt is alert w/no signs of acute distress... Ambulated to exam room w/NAD

## 2013-06-06 NOTE — ED Provider Notes (Addendum)
Garrett Turner is a 62 y.o. male who presents to Urgent Care today for  1) inguinal hernia: Patient has a history of left-sided inguinal hernia that has been present for 9 months. His primary care provider has been monitoring it. However he notes that he has been worsening recently. The hernia descends into his scrotum daily.  The hernia typically regresses at nighttime. He denies any significant incontinence nausea vomiting or diarrhea. He states that he would have surgery today if he could.   2) fall: Patient tripped and fell one week ago. He landed on his anterior chest wall. His right fist with contract between the ground and his right anterior chest wall. He notes pain where he essentially punched himself in the chest. The pain is worse with coughing and deep inspiration. He denies any significant shortness of breath or palpitations. He denies any exertional chest pain.   Past Medical History  Diagnosis Date  . Diabetes mellitus   . Hypertension   . High cholesterol   . Anxiety disorder   . Pancytopenia   . Inguinal hernia     Left  . Cirrhosis   . UTI (lower urinary tract infection)   . Depression   . Anxiety   . Leg cramps    History  Substance Use Topics  . Smoking status: Never Smoker   . Smokeless tobacco: Former Neurosurgeon  . Alcohol Use: 1.1 oz/week    1 Glasses of wine, 1 Drinks containing 0.5 oz of alcohol per week   ROS as above Medications reviewed. No current facility-administered medications for this encounter.   Current Outpatient Prescriptions  Medication Sig Dispense Refill  . atorvastatin (LIPITOR) 20 MG tablet Take 1 tablet (20 mg total) by mouth daily.  30 tablet  1  . glimepiride (AMARYL) 4 MG tablet Take 1 tablet (4 mg total) by mouth daily before breakfast.  30 tablet  2  . LORazepam (ATIVAN) 1 MG tablet Take 1 tablet (1 mg total) by mouth at bedtime as needed (sleep).  30 tablet  5  . metFORMIN (GLUCOPHAGE) 1000 MG tablet TAKE ONE TABLET BY MOUTH TWICE  DAILY WITH MEALS  60 tablet  0  . amoxicillin (AMOXIL) 500 MG capsule Take 2 capsules (1,000 mg total) by mouth 3 (three) times daily.  60 capsule  0  . aspirin 81 MG tablet Take 81 mg by mouth daily.      Marland Kitchen azithromycin (ZITHROMAX) 250 MG tablet Take 1 tablet (250 mg total) by mouth daily. Take first 2 tablets together, then 1 every day until finished.  6 tablet  0  . betamethasone dipropionate (DIPROLENE) 0.05 % cream Apply topically 2 (two) times daily.      . famotidine (PEPCID) 40 MG tablet Take 1 tablet (40 mg total) by mouth daily.  30 tablet  2  . glucose monitoring kit (FREESTYLE) monitoring kit 1 each by Does not apply route 4 (four) times daily - after meals and at bedtime. 1 month Diabetic Testing Supplies for QAC-QHS accuchecks.  1 each  1  . losartan-hydrochlorothiazide (HYZAAR) 50-12.5 MG per tablet Take 1 tablet by mouth daily.  90 tablet  3  . metFORMIN (GLUCOPHAGE) 1000 MG tablet Take 1 tablet (1,000 mg total) by mouth 2 (two) times daily with a meal.  60 tablet  2  . propranolol (INDERAL) 10 MG tablet Take 1 tablet (10 mg total) by mouth 2 (two) times daily.  60 tablet  2  . sitaGLIPtin (JANUVIA) 100 MG tablet Take  1 tablet (100 mg total) by mouth daily.  30 tablet  0  . tamsulosin (FLOMAX) 0.4 MG CAPS capsule Take 1 capsule (0.4 mg total) by mouth daily.  30 capsule  3  . traMADol (ULTRAM) 50 MG tablet Take 1 tablet (50 mg total) by mouth every 12 (twelve) hours as needed for severe pain.  15 tablet  0  . traZODone (DESYREL) 25 mg TABS tablet Take 0.5 tablets (25 mg total) by mouth at bedtime.  30 tablet  2  . [DISCONTINUED] amitriptyline (ELAVIL) 50 MG tablet Take 1 tablet (50 mg total) by mouth at bedtime.  30 tablet  2  . [DISCONTINUED] imipramine (TOFRANIL) 50 MG tablet Take 4 tablets (200 mg total) by mouth at bedtime.  30 tablet  1    Exam:  BP 128/72  Pulse 80  Temp(Src) 97.6 F (36.4 C) (Oral)  SpO2 98% Gen: Well NAD HEENT: EOMI,  MMM Lungs: CTABL Nl WOB Heart:  RRR no MRG Abd: NABS, NT, ND Exts: Non edematous BL  LE, warm and well perfused.  Groin: No distended hernias bilaterally. External inguinal ring is large on the left.  Left knee:  Well-appearing no effusions or abrasions or contusion Region motion 0-120 Tender palpation medial joint line Positive McMurray's test medially negative laterally Ligamentous testing is normal  No results found for this or any previous visit (from the past 24 hour(s)). Dg Chest 1 View  06/06/2013   CLINICAL DATA:  62 year old male status post fall with right rib pain. Cough. Initial encounter.  EXAM: CHEST - 1 VIEW  COMPARISON:  Frontal chest and right rib series from the same day reported separately. Thoracic spine series 12 02/2011.  FINDINGS: No pleural effusion. Chronic elevation of the right hemidiaphragm. Patchy increased right middle lobe opacity suggested on this view.  New T11 mild compression fracture new since 2012.  IMPRESSION: 1. Suggestion of patchy increased middle lobe opacity on this view, consider middle lobe bronchopneumonia. No pleural effusion. Chronic elevation of the right hemidiaphragm.  2. Chronic T12 compression fracture status post augmentation. New mild T11 compression fracture since 2012. If this is suspected to be acute, and if specific therapy such as vertebroplasty is desired, lumbar MRI or whole-body bone scan would best determine acuity.   Electronically Signed   By: Augusto Gamble M.D.   On: 06/06/2013 09:46   Dg Ribs Unilateral W/chest Right  06/06/2013   CLINICAL DATA:  Right upper rib pain post fall, cough  EXAM: RIGHT RIBS AND CHEST - 3+ VIEW  COMPARISON:  12/09/2008  FINDINGS: Upper normal heart size.  Atherosclerotic calcification aortic arch.  Mediastinal contours and pulmonary vascularity otherwise normal.  Chronic elevation of right diaphragm.  Mild chronic peribronchial thickening and minimal right basilar atelectasis.  No acute infiltrate, pleural effusion or pneumothorax.  Osseous  mineralization normal.  No definite rib fracture or bone destruction identified.  IMPRESSION: Minimal chronic bronchitic changes in right basilar atelectasis.  No definite right rib fracture identified.   Electronically Signed   By: Ulyses Southward M.D.   On: 06/06/2013 09:48    Knee injection. Left Consent obtained and timeout performed.  Patient seated in a comfortable position with legs hanging off the table.  The medial Peri-patellar tendon space was palpated and marked. The skin was then cleaned with alcohol. Cold spray applied. A 22-gauge inch and a half needle was used to inject 40 mg of Depo-Medrol and 3 mL of 2% lidocaine. Patient tolerated procedure well no bleeding. Pain  improved following injection  Patient was also given 40 mg Depo-Medrol IM.   Assessment and Plan: 62 y.o. male with  1) community-acquired pneumonia. Plan to treat with azithromycin and amoxicillin.  Followup with primary care provider.  2) rib contusion: Plan to treat conservatively with over-the-counter pain medications and tramadol. 3) knee pain: Status post Depo-Medrol injection. Likely due to meniscus injury. Followup with primary care provider. 4) inguinal hernia: Refer to [facility] general surgery clinic. Discussed warning signs or symptoms. Please see discharge instructions. Patient expresses understanding.      Rodolph Bong, MD 06/06/13 1030  Rodolph Bong, MD 06/06/13 [phone]

## 2013-06-09 NOTE — ED Notes (Signed)
Patient called with questions related to getting appt at [facility] .  Patient had instructions with him, over the phone discussed instructions including phone number and extension  To call for appt.

## 2013-06-13 ENCOUNTER — Emergency Department (INDEPENDENT_AMBULATORY_CARE_PROVIDER_SITE_OTHER): Payer: No Typology Code available for payment source

## 2013-06-13 ENCOUNTER — Emergency Department (HOSPITAL_COMMUNITY)
Admission: EM | Admit: 2013-06-13 | Discharge: 2013-06-13 | Disposition: A | Discharge status: Home / Self Care | Payer: No Typology Code available for payment source | Source: Home / Self Care | Attending: Emergency Medicine | Admitting: Emergency Medicine

## 2013-06-13 ENCOUNTER — Encounter (HOSPITAL_COMMUNITY): Payer: Self-pay | Admitting: Emergency Medicine

## 2013-06-13 DIAGNOSIS — R0789 Other chest pain: Secondary | ICD-10-CM

## 2013-06-13 DIAGNOSIS — J209 Acute bronchitis, unspecified: Secondary | ICD-10-CM

## 2013-06-13 MED ORDER — GUAIFENESIN-CODEINE 100-10 MG/5ML PO SYRP: 10.0000 mL | ORAL_SOLUTION | Freq: Four times a day (QID) | ORAL | Status: DC | PRN

## 2013-06-13 MED ORDER — ALBUTEROL SULFATE HFA 108 (90 BASE) MCG/ACT IN AERS: 2.0000 | INHALATION_SPRAY | Freq: Four times a day (QID) | RESPIRATORY_TRACT | Status: DC | PRN

## 2013-06-13 MED ORDER — HYDROCODONE-ACETAMINOPHEN 5-325 MG PO TABS: ORAL_TABLET | ORAL | Status: DC

## 2013-06-13 NOTE — ED Provider Notes (Signed)
Chief Complaint:   Chief Complaint  Patient presents with  . Follow-up    History of Present Illness:   Garrett Turner is a 62 year old male whose history began about 2 and half weeks ago on November 11. The patient fell at home in his yard, landing flat on his chest, with his fist balled up in his chest. Thereafter he had pain in the sternal area which would be worse with movement, twisting, bending, stretching, deep inspiration, or cough. He developed a cough productive of green sputum. He was seen here on November 18 which was a week ago. He had a chest x-ray which showed a possible pneumonia, some vertebral fractures which may have been old. He denies any pain in his back. He also was here for a painful inguinal hernia. The inguinal hernia is on the left, but he also has some pain in the right. He was also here for some knee pain which was injected with corticosteroid. For the pneumonia he was placed on amoxicillin and azithromycin given tramadol for the pain. Since he was here last the pain in his chest it is spreading out throughout the entire anterior chest. He denies any fever or chills. He's still coughing up some green sputum, his chest feels tight, he's had some wheezing, and feels short of breath. He's also had nasal congestion and rhinorrhea. His knee pain went away completely after the injection. He has an appointment at [facility] on December 8 for the inguinal hernias.  Review of Systems:  Other than noted above, the patient denies any of the following symptoms. Systemic:  No fever, chills, sweats, or fatigue. ENT:  No nasal congestion, rhinorrhea, or sore throat. Pulmonary:  No cough, wheezing, shortness of breath, sputum production, hemoptysis. Cardiac:  No palpitations, rapid heartbeat, dizziness, presyncope or syncope. GI:  No abdominal pain, heartburn, nausea, or vomiting. Ext:  No leg pain or swelling.  PMFSH:  Past medical history, family history, social history,  meds, and allergies were reviewed and updated as needed. He has a history of diabetes, hypertension, and hypercholesterolemia. He takes Lipitor, Pepcid, Amaryl, losartan/HCTZ, metformin, propranolol, Januvia, Flomax, and trazodone. He has no medication allergies. The patient had been followed at [facility] and [facility], but states that right now he does not have a primary care physician. He works 4 nights a week at the [employer] at [employer]. 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 The patient states his wife passed away within the past year after a lengthy, chronic illness which involved severe episodes of hypoglycemia of unknown cause. The patient states he just came on one afternoon and found her lying dead on the bed. The patient states he spent all his savings in taking care of her and she died any way. He states he's very angry at her for having passed away.  Physical Exam:   Vital signs:  BP 126/80  Pulse 62  Temp(Src) 97.6 F (36.4 C) (Oral)  Resp 16  SpO2 99% Gen:  Alert, oriented, in no distress, skin warm and dry. Eye:  PERRL, lids and conjunctivas normal.  Sclera non-icteric. ENT:  Mucous membranes moist, pharynx clear. Neck:  Supple, no adenopathy or tenderness.  No JVD. Lungs:  Clear to auscultation, no wheezes, rales or rhonchi.  No respiratory distress. Heart:  Regular rhythm.  No gallops, murmers, clicks or rubs. Chest:  He has tenderness to palpation mainly over the  sternum. No swelling, bruising, or deformity. There is no pain to palpation over the thoracic spine or over the rib cages. Abdomen:  Soft, nontender, no organomegaly or mass.  Bowel sounds normal.  No pulsatile abdominal mass or bruit. Ext:  No edema.  No calf tenderness and Homann's sign negative.  Pulses full and equal. Skin:  Warm and dry.  No rash.  Radiology:  Dg Chest 2 View  06/13/2013   CLINICAL DATA:  Status  post fall 2 weeks ago.  History of pneumonia.  EXAM: CHEST  2 VIEW  COMPARISON:  The Plain film of the chest 06/06/2013 and 12/09/2008.  FINDINGS: Mild, streaky opacities in lung bases are likely due to atelectasis, unchanged since the most recent study. The lungs are otherwise clear. Heart size is normal. Mild elevation of the right hemidiaphragm relative to the left is noted.  IMPRESSION: Mild, streaky basilar opacities likely due to atelectasis.   Electronically Signed   By: Drusilla Kanner M.D.   On: 06/13/2013 09:30   I reviewed the images independently and personally and concur with the radiologist's findings.  EKG:   Date: 06/13/2013  Rate: 58  Rhythm: sinus bradycardia  QRS Axis: normal  Intervals: normal  ST/T Wave abnormalities: normal  Conduction Disutrbances:none  Narrative Interpretation: Sinus bradycardia, otherwise normal EKG.  Old EKG Reviewed: none available  Assessment:  The primary encounter diagnosis was Musculoskeletal chest pain. A diagnosis of Acute bronchitis was also pertinent to this visit.  I think his chest pain is musculoskeletal, due to a fall at home. This will probably take about 6 weeks to heal up completely. He was given some pain meds. He also appears to have some bronchitis. He was given an inhaler and cough medicine for this. He does not need any further antibiotics. His pneumonia has cleared up completely on chest x-ray, although the x-ray does not look any different to me than it did a week ago. I think the initial reading is somewhat in question. His knee pain is better after the injection. He has an appointment for repair of his hernias.  Plan:   1.  Meds:  The following meds were prescribed:   New Prescriptions   ALBUTEROL (PROVENTIL HFA;VENTOLIN HFA) 108 (90 BASE) MCG/ACT INHALER    Inhale 2 puffs into the lungs every 6 (six) hours as needed for wheezing or shortness of breath.   GUAIFENESIN-CODEINE (GUIATUSS AC) 100-10 MG/5ML SYRUP    Take 10 mLs by  mouth 4 (four) times daily as needed for cough.   HYDROCODONE-ACETAMINOPHEN (NORCO/VICODIN) 5-325 MG PER TABLET    1 to 2 tabs every 4 to 6 hours as needed for pain.    2.  Patient Education/Counseling:  The patient was given appropriate handouts, self care instructions, and instructed in symptomatic relief.  No heavy lifting or strenuous work.  3.  Follow up:  The patient was told to follow up if no better in 3 to 4 days, if becoming worse in any way, and give an an some red flag symptoms such as worsening chest pain or difficulty breathing which would prompt immediate return.  Follow up here if necessary, and followup at the [facility] and [facility] for his diabetes.     Reuben Likes, MD 06/13/13 1016

## 2013-06-13 NOTE — ED Notes (Signed)
Patient in ucc for follow up to pneumonia diagnosis 11/18.  Patient reports continued congestion, green phlegm.  Chest pain in center of chest continues and is radiating outward.  Coughing is painful.

## 2013-06-13 NOTE — ED Notes (Deleted)
C/o cough which is hurting chest for two days States advil has been taken but no relief.  Cough is a little productive Does have history of pneumonia Has used inhaler in past Does take OTC allergy medication prn  

## 2013-06-13 NOTE — ED Notes (Signed)
Patient transported to X-ray 

## 2013-06-19 HISTORY — PX: INGUINAL HERNIA REPAIR: SUR1180

## 2013-07-03 ENCOUNTER — Other Ambulatory Visit: Payer: Self-pay | Admitting: Emergency Medicine

## 2013-07-03 MED ORDER — METFORMIN HCL 1000 MG PO TABS: 1000.0000 mg | ORAL_TABLET | Freq: Two times a day (BID) | ORAL | Status: DC

## 2013-07-04 ENCOUNTER — Encounter (HOSPITAL_COMMUNITY): Payer: Self-pay | Admitting: Emergency Medicine

## 2013-07-04 ENCOUNTER — Emergency Department (HOSPITAL_COMMUNITY)
Admission: EM | Admit: 2013-07-04 | Discharge: 2013-07-04 | Disposition: A | Discharge status: Nursing Home (Includes ICF) | Payer: No Typology Code available for payment source | Source: Home / Self Care | Attending: Family Medicine | Admitting: Family Medicine

## 2013-07-04 ENCOUNTER — Emergency Department (HOSPITAL_COMMUNITY)
Admission: EM | Admit: 2013-07-04 | Discharge: 2013-07-04 | Disposition: A | Discharge status: Home / Self Care | Payer: No Typology Code available for payment source | Attending: Emergency Medicine | Admitting: Emergency Medicine

## 2013-07-04 ENCOUNTER — Emergency Department (HOSPITAL_COMMUNITY): Payer: No Typology Code available for payment source

## 2013-07-04 DIAGNOSIS — IMO0002 Reserved for concepts with insufficient information to code with codable children: Secondary | ICD-10-CM | POA: Insufficient documentation

## 2013-07-04 DIAGNOSIS — E7800 Pure hypercholesterolemia, unspecified: Secondary | ICD-10-CM | POA: Insufficient documentation

## 2013-07-04 DIAGNOSIS — Z87891 Personal history of nicotine dependence: Secondary | ICD-10-CM | POA: Insufficient documentation

## 2013-07-04 DIAGNOSIS — F329 Major depressive disorder, single episode, unspecified: Secondary | ICD-10-CM | POA: Insufficient documentation

## 2013-07-04 DIAGNOSIS — N453 Epididymo-orchitis: Secondary | ICD-10-CM

## 2013-07-04 DIAGNOSIS — Z87440 Personal history of urinary (tract) infections: Secondary | ICD-10-CM | POA: Insufficient documentation

## 2013-07-04 DIAGNOSIS — Z7982 Long term (current) use of aspirin: Secondary | ICD-10-CM | POA: Insufficient documentation

## 2013-07-04 DIAGNOSIS — I10 Essential (primary) hypertension: Secondary | ICD-10-CM | POA: Insufficient documentation

## 2013-07-04 DIAGNOSIS — K4030 Unilateral inguinal hernia, with obstruction, without gangrene, not specified as recurrent: Secondary | ICD-10-CM

## 2013-07-04 DIAGNOSIS — Z79899 Other long term (current) drug therapy: Secondary | ICD-10-CM | POA: Insufficient documentation

## 2013-07-04 DIAGNOSIS — F411 Generalized anxiety disorder: Secondary | ICD-10-CM | POA: Insufficient documentation

## 2013-07-04 DIAGNOSIS — F3289 Other specified depressive episodes: Secondary | ICD-10-CM | POA: Insufficient documentation

## 2013-07-04 DIAGNOSIS — E119 Type 2 diabetes mellitus without complications: Secondary | ICD-10-CM | POA: Insufficient documentation

## 2013-07-04 DIAGNOSIS — Z862 Personal history of diseases of the blood and blood-forming organs and certain disorders involving the immune mechanism: Secondary | ICD-10-CM | POA: Insufficient documentation

## 2013-07-04 DIAGNOSIS — A5423 Gonococcal infection of other male genital organs: Secondary | ICD-10-CM | POA: Insufficient documentation

## 2013-07-04 DIAGNOSIS — K4090 Unilateral inguinal hernia, without obstruction or gangrene, not specified as recurrent: Secondary | ICD-10-CM | POA: Insufficient documentation

## 2013-07-04 DIAGNOSIS — Z8739 Personal history of other diseases of the musculoskeletal system and connective tissue: Secondary | ICD-10-CM | POA: Insufficient documentation

## 2013-07-04 DIAGNOSIS — Z9889 Other specified postprocedural states: Secondary | ICD-10-CM | POA: Insufficient documentation

## 2013-07-04 LAB — URINALYSIS, ROUTINE W REFLEX MICROSCOPIC
Glucose, UA: 250 mg/dL — AB
Hgb urine dipstick: NEGATIVE
Ketones, ur: NEGATIVE mg/dL
Leukocytes, UA: NEGATIVE
Nitrite: NEGATIVE
Protein, ur: NEGATIVE mg/dL

## 2013-07-04 MED ORDER — SULFAMETHOXAZOLE-TRIMETHOPRIM 800-160 MG PO TABS: 1.0000 | ORAL_TABLET | Freq: Two times a day (BID) | ORAL | Status: DC

## 2013-07-04 MED ORDER — HYDROCODONE-ACETAMINOPHEN 5-325 MG PO TABS: ORAL_TABLET | ORAL | Status: DC

## 2013-07-04 MED ORDER — LIDOCAINE HCL (PF) 1 % IJ SOLN
2.0000 mL | Freq: Once | INTRAMUSCULAR | Status: AC
  Administered 2013-07-04: 2 mL

## 2013-07-04 MED ORDER — LIDOCAINE HCL (PF) 1 % IJ SOLN
INTRAMUSCULAR | Status: AC
  Filled 2013-07-04: qty 5

## 2013-07-04 MED ORDER — CEFTRIAXONE SODIUM 1 G IJ SOLR
1.0000 g | INTRAMUSCULAR | Status: DC
  Administered 2013-07-04: 1 g via INTRAMUSCULAR
  Filled 2013-07-04: qty 10

## 2013-07-04 NOTE — Progress Notes (Signed)
Garrett Turner October 05, 1950  [id number].    Requesting MD: Dr. Lynelle Doctor Chief Complaint/Reason for Consult: Right and left groin pain, left inguinal hernia  HPI:  62 y/o widowed male with PMH DM, HLD/HTN, anxiety/depression who presents to [facility] for 1 month of b/l inguinal pain.  He has a h/o a left inguinal hernia and he was seen by Dr. Daphine Deutscher on 09/11/11 in our office.  He currently c/o right sided scrotal/inguinal sharp/stabbing pain.  He describes pain in his left scrotum as a throbbing secondary to the hernia sliding down into his scrotum.  He notes the hernia has been more frequently bulging out and over the last 3 days has become unbearable.  Subsequently, secondary to money issues (he has the orange card) he was seen by [facility] general surgeon and may have a surgery date on 08/03/13 if his paperwork goes through.  Any movement including walking, sitting up, or bending increase his pain.  He has not tried anything to alleviate the pain.  No N/V, fever/chills, abdominal pain, having normal BM's and urinating well.    ROS: All systems reviewed and otherwise negative except for as above  Family History  Problem Relation Age of Onset  . Coronary artery disease    . Hypertension    . Heart failure Father   . Hypertension Father   . Heart disease Father   . Heart failure Mother   . Hypertension Mother   . Heart disease Mother   . Stroke Paternal Grandmother   . Obesity Daughter     Past Medical History  Diagnosis Date  . Diabetes mellitus   . Hypertension   . High cholesterol   . Anxiety disorder   . Pancytopenia   . Inguinal hernia     Left  . Cirrhosis   . UTI (lower urinary tract infection)   . Depression   . Anxiety   . Leg cramps     Past Surgical History  Procedure Laterality Date  . Tonsillectomy    . Esophagogastroduodenoscopy  11/04/2011    Procedure: ESOPHAGOGASTRODUODENOSCOPY (EGD);  Surgeon: Willis Modena, MD;  Location: [facility];  Service: Endoscopy;   Laterality: N/A;  . Colonoscopy  11/04/2011    Procedure: COLONOSCOPY;  Surgeon: Willis Modena, MD;  Location: [facility];  Service: Endoscopy;  Laterality: N/A;  . Vasectomy  1985    Social History:  reports that he has never smoked. He has quit using smokeless tobacco. He reports that he drinks about 1.1 ounces of alcohol per week. He reports that he does not use illicit drugs.  Allergies: No Known Allergies   (Not in a hospital admission)  Blood pressure 148/76, pulse 63, temperature 97.9 F (36.6 C), temperature source Oral, resp. rate 18, SpO2 99.00%. Physical Exam: General: passive aggressive in nature, agitated, WD/WN white male who is laying in bed in moderate discomfort HEENT: head is normocephalic, atraumatic.  Sclera are noninjected.  PERRL.  Ears and nose without any masses or lesions.  Mouth is pink and moist Heart: regular, rate, and rhythm.  No obvious murmurs, gallops, or rubs noted.  Palpable pedal pulses bilaterally Lungs: CTAB, no wheezes, rhonchi, or rales noted.  Respiratory effort nonlabored Abd: soft, NT/ND, +BS, no masses, no abdominal hernias palpated, or organomegaly Groin:  B/l testicular swelling, no erythema edema or induration, widely patent inguinal ring on left side with easily reducible hernia which is tender to palp; b/l groin and scrotum tender to palpation; no hernia palpated on right MS: all 4 extremities  are symmetrical with no cyanosis, clubbing, or edema. Skin: warm and dry with no masses, lesions, or rashes Psych: A&Ox3 with an appropriate affect, agitated and passive aggressive  No results found for this or any previous visit (from the past 48 hour(s)). [place] Art/ven Flow Abd Pelv Doppler  07/04/2013   CLINICAL DATA:  Right-sided pain.  EXAM: SCROTAL ULTRASOUND  DOPPLER ULTRASOUND OF THE TESTICLES  TECHNIQUE: Complete ultrasound examination of the testicles, epididymis, and other scrotal structures was performed. Color and spectral Doppler  ultrasound were also utilized to evaluate blood flow to the testicles.  COMPARISON:  None.  FINDINGS: Right testicle  Measurements: 4.4 x 2.3 x 2.5 cm. The right testicle is heterogeneous with a striated pattern. There is arterial and venous flow within the testicle. There is no focal testicular lesion.  Left testicle  Measurements: 3.2 x 2.2 x 2.8 cm. No mass or microlithiasis visualized.  Right epididymis: There is heterogeneous structure adjacent to the right testicle the does not have the appearance of a normal epididymis. There are prominent vascular structures in this area. This heterogeneous material is nonspecific but could represent a scrotal hernia. CT from 06/08/2011 demonstrated bilateral inguinal hernias. Normal epididymis tissue is not clearly identified.  Left epididymis: There is large hypoechoic structure with a few internal echoes in the region of the left epididymis. This structure measures 8.3 x 3.3 x 5.3 cm. This may represent a very large spermatocele or loculated hydrocele. The epididymal head appears to be very echogenic.  Hydrocele:  Large cystic collection on the left side.  Varicocele:  None visualized.  Pulsed Doppler interrogation of both testes demonstrates low resistance arterial and venous waveforms bilaterally.  IMPRESSION: Right testicle is heterogeneous without torsion. Heterogeneous structure adjacent to the right testicle is not consistent with a normal epididymis and question a scrotal hernia. Reportedly, the patient is tender in the right inguinal canal and raises concern for an incarcerated hernia in this area. It is possible that the heterogeneous structure could represent an abnormal appearing epididymis related to prior vasectomy. Heterogeneous appearance of the testicle could be related to prior inflammation.  Large cystic collection adjacent to the left testicle most likely represents a large spermatocele.  Normal appearance of the left testicle without lesion or torsion.   These results were called by telephone at the time of interpretation on 07/04/2013 at 1:59 PM to Dr. Devoria Albe , who verbally acknowledged these results.   Electronically Signed   By: Richarda Overlie M.D.   On: 07/04/2013 14:05       Assessment/Plan Reducible left inguinal hernia (inguinal ring is widely patent)  Plan: 1.  Can follow up with Dr. Daphine Deutscher in the office who he's previously seen or can continue to follow up on arrangements at [facility] 2.  If he develops a bowel obstruction and/or incarcerated/strangualted hernia this would warrant urgent/emergent surgical intervention, but his exam does not display this at this time.   3.  Encouraged him to get spandex scrotal support, ice/heat therapy 4.  Continue with recommendations from urology (pain control and antibiotics) 5.  Encouraged to f/u sooner if he should stop having BM's, stop having flatus, N/V, fever/chills etc.  There are many social issues with this patient including financial and emotional (wife's death and financial debt).  He has a great disbelief in medical providers due to past experiences in which he explains.  He feels as though we are treating him differently because "he doesn't have good insurance".  I explained to  him I would explain the same scenario to anyone with a reducible hernia.  He understands that his surgery is neither urgent or emergent at this time and that he needs to try the treatments outlined by the ER doctor and urologist.     Aris Georgia 07/04/2013, 3:50 PM Pager: [phone]

## 2013-07-04 NOTE — Progress Notes (Signed)
Agree with plan. Violeta Gelinas, MD, MPH, FACS Pager: [phone]

## 2013-07-04 NOTE — ED Provider Notes (Signed)
CSN: [id number]     Arrival date & time 07/04/13  0957 History   First MD Initiated Contact with Patient 07/04/13 1126     Chief Complaint  Patient presents with  . Groin Pain   (Consider location/radiation/quality/duration/timing/severity/associated sxs/prior Treatment) HPI Patient states he has had a known left inguinal hernia for the past year and a half. He has been seen at [facility] surgery and he was referred to [facility] to have surgery done. He was supposed to have surgery on January 15. He states the hernia has been getting bigger for the last 3 days and coming farther down into the scrotum.He states normally it He says it's self when he sleeps.However he states it has been out for 3 days. He states he has a dull ache in that area. He reports for the past month he has been having a sharp jabbing pain that lasted about 10 seconds off and on in his right inguinal area. He is unsure swelling. He states it hasalso been getting worse the past 3 days.He is not having any difficulty urinating. He states he had a hard bowel movement last night and thinks he may have seen blood on his stool. He states when he arrived he saw streaks of blood on the tissue. He states he has been trying not to strain because of his hernia. Patient states he took a Vicodin at about 7 AM today for pain.  [facility]  Past Medical History  Diagnosis Date  . Diabetes mellitus   . Hypertension   . High cholesterol   . Anxiety disorder   . Pancytopenia   . Inguinal hernia     Left  . Cirrhosis   . UTI (lower urinary tract infection)   . Depression   . Anxiety   . Leg cramps    Past Surgical History  Procedure Laterality Date  . Tonsillectomy    . Esophagogastroduodenoscopy  11/04/2011    Procedure: ESOPHAGOGASTRODUODENOSCOPY (EGD);  Surgeon: Willis Modena, MD;  Location: [facility];  Service: Endoscopy;  Laterality: N/A;  . Colonoscopy  11/04/2011    Procedure: COLONOSCOPY;  Surgeon:  Willis Modena, MD;  Location: [facility];  Service: Endoscopy;  Laterality: N/A;  . Vasectomy  1985   Family History  Problem Relation Age of Onset  . Coronary artery disease    . Hypertension    . Heart failure Father   . Hypertension Father   . Heart disease Father   . Heart failure Mother   . Hypertension Mother   . Heart disease Mother   . Stroke Paternal Grandmother   . Obesity Daughter    History  Substance Use Topics  . Smoking status: Never Smoker   . Smokeless tobacco: Former Neurosurgeon  . 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    Allergies  Review of patient's allergies indicates no known allergies.  Home Medications   Current Outpatient Rx  Name  Route  Sig  Dispense  Refill  . aspirin EC 81 MG tablet   Oral   Take 81 mg by mouth daily.         Marland Kitchen atorvastatin (LIPITOR) 20 MG tablet   Oral   Take 1 tablet (20 mg total) by mouth daily.   30 tablet   1   .  betamethasone dipropionate (DIPROLENE) 0.05 % cream   Topical   Apply topically 2 (two) times daily.         Marland Kitchen glimepiride (AMARYL) 4 MG tablet   Oral   Take 1 tablet (4 mg total) by mouth daily before breakfast.   30 tablet   2   . glucose monitoring kit (FREESTYLE) monitoring kit   Does not apply   1 each by Does not apply route 4 (four) times daily - after meals and at bedtime. 1 month Diabetic Testing Supplies for QAC-QHS accuchecks.   1 each   1   . HYDROcodone-acetaminophen (NORCO/VICODIN) 5-325 MG per tablet   Oral   Take 1-2 tablets by mouth every 6 (six) hours as needed for moderate pain or severe pain.         Marland Kitchen LORazepam (ATIVAN) 1 MG tablet   Oral   Take 1 tablet (1 mg total) by mouth at bedtime as needed (sleep).   30 tablet   5   . losartan-hydrochlorothiazide (HYZAAR) 50-12.5 MG per tablet   Oral   Take 1 tablet  by mouth daily.   90 tablet   3   . metFORMIN (GLUCOPHAGE) 1000 MG tablet      TAKE ONE TABLET BY MOUTH TWICE DAILY WITH MEALS   60 tablet   0   . propranolol (INDERAL) 10 MG tablet   Oral   Take 1 tablet (10 mg total) by mouth 2 (two) times daily.   60 tablet   2   . traMADol (ULTRAM) 50 MG tablet   Oral   Take 1 tablet (50 mg total) by mouth every 12 (twelve) hours as needed for severe pain.   15 tablet   0   . traZODone (DESYREL) 25 mg TABS tablet   Oral   Take 0.5 tablets (25 mg total) by mouth at bedtime.   30 tablet   2    BP 152/72  Pulse 82  Temp(Src) 98.2 F (36.8 C) (Oral)  Resp 18  SpO2 97%  Vital signs normal   Physical Exam  Nursing note and vitals reviewed. Constitutional: He is oriented to person, place, and time. He appears well-developed and well-nourished.  Non-toxic appearance. He does not appear ill. No distress.  HENT:  Head: Normocephalic and atraumatic.  Right Ear: External ear normal.  Left Ear: External ear normal.  Nose: Nose normal. No mucosal edema or rhinorrhea.  Mouth/Throat: Oropharynx is clear and moist and mucous membranes are normal. No dental abscesses or uvula swelling.  Eyes: Conjunctivae and EOM are normal. Pupils are equal, round, and reactive to light.  Neck: Normal range of motion and full passive range of motion without pain. Neck supple.  Cardiovascular: Normal rate, regular rhythm and normal heart sounds.  Exam reveals no gallop and no friction rub.   No murmur heard. Pulmonary/Chest: Effort normal and breath sounds normal. No respiratory distress. He has no wheezes. He has no rhonchi. He has no rales. He exhibits no tenderness and no crepitus.  Abdominal: Soft. Normal appearance and bowel sounds are normal. He exhibits no distension. There is no tenderness. There is no rebound and no guarding.  Genitourinary:  Patient has an hernia into his left scrotum., it is not hard or discolored, it was easily reducible. He has  tenderness along the right epididymis and questionable small branching at the inguinal ring on the right. His scrotum is noted bilaterally, questionable hydroceles.  Musculoskeletal: Normal range of motion. He exhibits no edema  and no tenderness.  Moves all extremities well.   Neurological: He is alert and oriented to person, place, and time. He has normal strength. No cranial nerve deficit.  Skin: Skin is warm, dry and intact. No rash noted. No erythema. No pallor.  Psychiatric: He has a normal mood and affect. His speech is normal and behavior is normal. His mood appears not anxious.    ED Course  Procedures (including critical care time)  Pt offered pain medication, but he reports already taking pain meds today  14:00 Dr Lowella Dandy, radiology called about his [place], states he has a large spermatocele on the left, but his right testicle is heterogeneous and his epididymis on the right is abnormal.  He is questioning ? intermittent torsion, but has good blood flow today.   14:19 Dr Berneice Heinrich, Urology, has reviewed his [place] and feels the right testicle and epididymus are c/w infectious process, recommends IM rocephin and oral septra/fluoroquinolones for 10 days and see in the office.   Pt very frustrated states "you are just masking the pain" then I will be right back where I was. I am unable to bend over to do my job, my hernia isn't going back in at night". He states he was told it would be July before he could get the hernia repaired from [facility] and he states he was told IF he qualifies to have surgery at [facility] it would be Jan 15. I will have surgery come talk to patient.   14:53 Dr Janee Morn will have his PA come talk to patient.  PT has been seen by PA Dort, pt to return if he gets symptoms of incarceration/strangulation    . Labs Review Results for orders placed during the hospital encounter of 07/04/13  URINALYSIS, ROUTINE W REFLEX MICROSCOPIC      Result Value Range   Color,  Urine YELLOW  YELLOW   APPearance CLEAR  CLEAR   Specific Gravity, Urine 1.025  1.005 - 1.030   pH 5.5  5.0 - 8.0   Glucose, UA 250 (*) NEGATIVE mg/dL   Hgb urine dipstick NEGATIVE  NEGATIVE   Bilirubin Urine NEGATIVE  NEGATIVE   Ketones, ur NEGATIVE  NEGATIVE mg/dL   Protein, ur NEGATIVE  NEGATIVE mg/dL   Urobilinogen, UA 1.0  0.0 - 1.0 mg/dL   Nitrite NEGATIVE  NEGATIVE   Leukocytes, UA NEGATIVE  NEGATIVE   Laboratory interpretation all normal    Imaging Review [place] Art/ven Flow Abd Pelv Doppler  07/04/2013   CLINICAL DATA:  Right-sided pain.  EXAM: SCROTAL ULTRASOUND  DOPPLER ULTRASOUND OF THE TESTICLES  TECHNIQUE: Complete ultrasound examination of the testicles, epididymis, and other scrotal structures was performed. Color and spectral Doppler ultrasound were also utilized to evaluate blood flow to the testicles.  COMPARISON:  None.  FINDINGS: Right testicle  Measurements: 4.4 x 2.3 x 2.5 cm. The right testicle is heterogeneous with a striated pattern. There is arterial and venous flow within the testicle. There is no focal testicular lesion.  Left testicle  Measurements: 3.2 x 2.2 x 2.8 cm. No mass or microlithiasis visualized.  Right epididymis: There is heterogeneous structure adjacent to the right testicle the does not have the appearance of a normal epididymis. There are prominent vascular structures in this area. This heterogeneous material is nonspecific but could represent a scrotal hernia. CT from 06/08/2011 demonstrated bilateral inguinal hernias. Normal epididymis tissue is not clearly identified.  Left epididymis: There is large hypoechoic structure with a few internal echoes  in the region of the left epididymis. This structure measures 8.3 x 3.3 x 5.3 cm. This may represent a very large spermatocele or loculated hydrocele. The epididymal head appears to be very echogenic.  Hydrocele:  Large cystic collection on the left side.  Varicocele:  None visualized.  Pulsed Doppler  interrogation of both testes demonstrates low resistance arterial and venous waveforms bilaterally.  IMPRESSION: Right testicle is heterogeneous without torsion. Heterogeneous structure adjacent to the right testicle is not consistent with a normal epididymis and question a scrotal hernia. Reportedly, the patient is tender in the right inguinal canal and raises concern for an incarcerated hernia in this area. It is possible that the heterogeneous structure could represent an abnormal appearing epididymis related to prior vasectomy. Heterogeneous appearance of the testicle could be related to prior inflammation.  Large cystic collection adjacent to the left testicle most likely represents a large spermatocele.  Normal appearance of the left testicle without lesion or torsion.  These results were called by telephone at the time of interpretation on 07/04/2013 at 1:59 PM to Dr. Devoria Albe , who verbally acknowledged these results.   Electronically Signed   By: Richarda Overlie M.D.   On: 07/04/2013 14:05    EKG Interpretation   None       MDM   1. Inguinal hernia, left   2. Epididymo-orchitis, acute    Discharge Medication List as of 07/04/2013  4:02 PM    START taking these medications   Details  !! HYDROcodone-acetaminophen (NORCO/VICODIN) 5-325 MG per tablet Take 1 or 2 po Q 6hrs for pain, Print    !! HYDROcodone-acetaminophen (NORCO/VICODIN) 5-325 MG per tablet Take 1 or 2 po Q 6hrs for pain, Print    !! sulfamethoxazole-trimethoprim (SEPTRA DS) 800-160 MG per tablet Take 1 tablet by mouth every 12 (twelve) hours., Starting 07/04/2013, Until Discontinued, Print    !! sulfamethoxazole-trimethoprim (SEPTRA DS) 800-160 MG per tablet Take 1 tablet by mouth every 12 (twelve) hours., Starting 07/04/2013, Until Discontinued, Print     !! - Potential duplicate medications found. Please discuss with provider.      Plan discharge   Devoria Albe, MD, Franz Dell, MD 07/05/13 [phone]

## 2013-07-04 NOTE — ED Provider Notes (Signed)
Medical screening examination/treatment/procedure(s) were performed by resident physician or non-physician practitioner and as supervising physician I was immediately available for consultation/collaboration.   KINDL,JAMES DOUGLAS MD.   James D Kindl, MD 07/04/13 1224 

## 2013-07-04 NOTE — ED Notes (Signed)
Pt here from [facility] with increasing inguinal hernia x 2 days with increased pain and size; pt sts on left side x 1.5 years that was reducible but now greatly increased in size

## 2013-07-04 NOTE — ED Notes (Signed)
Surgery PA at bedside.  

## 2013-07-04 NOTE — ED Provider Notes (Signed)
Garrett Turner is a 62 y.o. male who presents to Urgent Care today for evaluation of scrotal pain. He reports that he has an inguinal hernia that has been evaluated and was planned for surgery at [facility] 08/03/13 (if paperwork goes through). Over the past month he has had a right scrotal/inguinal sharp/stabbing pain. However, over the past 3 days this has become much more severe occurring whenever he moves or strains suddenly, lasting 10 seconds, then slowly going away. It is also tender to the touch. He feels like the scrotal bulging has gone from 1/2 to 1 whole handful. Moving, walking, bending are very painful in inguinal/scrotal region. Denies fevers or chills. He thinks last night he saw blood when he wiped his backside after having a bowel movement. He decided to be evaluated at [facility] for this due to financial issues recently after death of his wife 11/03/2013and loss of his business.   Past Medical History  Diagnosis Date  . Diabetes mellitus   . Hypertension   . High cholesterol   . Anxiety disorder   . Pancytopenia   . Inguinal hernia     Left  . Cirrhosis   . UTI (lower urinary tract infection)   . Depression   . Anxiety   . Leg cramps    History  Substance Use Topics  . Smoking status: Never Smoker   . Smokeless tobacco: Former Neurosurgeon  . Alcohol Use: 1.1 oz/week    1 Glasses of wine, 1 Drinks containing 0.5 oz of alcohol per week   Wife recently passed and he spent his life savings caring for her (last 05-23-2023) and has also lost his [employer] over the past year.  ROS as above Medications reviewed. No current facility-administered medications for this encounter.   Current Outpatient Prescriptions  Medication Sig Dispense Refill  . albuterol (PROVENTIL HFA;VENTOLIN HFA) 108 (90 BASE) MCG/ACT inhaler Inhale 2 puffs into the lungs every 6 (six) hours as needed for wheezing or shortness of breath.  1 Inhaler  0  . amoxicillin (AMOXIL) 500 MG capsule  Take 2 capsules (1,000 mg total) by mouth 3 (three) times daily.  60 capsule  0  . aspirin 81 MG tablet Take 81 mg by mouth daily.      Marland Kitchen atorvastatin (LIPITOR) 20 MG tablet Take 1 tablet (20 mg total) by mouth daily.  30 tablet  1  . azithromycin (ZITHROMAX) 250 MG tablet Take 1 tablet (250 mg total) by mouth daily. Take first 2 tablets together, then 1 every day until finished.  6 tablet  0  . betamethasone dipropionate (DIPROLENE) 0.05 % cream Apply topically 2 (two) times daily.      . famotidine (PEPCID) 40 MG tablet Take 1 tablet (40 mg total) by mouth daily.  30 tablet  2  . glimepiride (AMARYL) 4 MG tablet Take 1 tablet (4 mg total) by mouth daily before breakfast.  30 tablet  2  . glucose monitoring kit (FREESTYLE) monitoring kit 1 each by Does not apply route 4 (four) times daily - after meals and at bedtime. 1 month Diabetic Testing Supplies for QAC-QHS accuchecks.  1 each  1  . guaiFENesin-codeine (GUIATUSS AC) 100-10 MG/5ML syrup Take 10 mLs by mouth 4 (four) times daily as needed for cough.  120 mL  1  . HYDROcodone-acetaminophen (NORCO/VICODIN) 5-325 MG per tablet 1 to 2 tabs every 4 to 6 hours as needed for pain.  30 tablet  0  . LORazepam (  ATIVAN) 1 MG tablet Take 1 tablet (1 mg total) by mouth at bedtime as needed (sleep).  30 tablet  5  . losartan-hydrochlorothiazide (HYZAAR) 50-12.5 MG per tablet Take 1 tablet by mouth daily.  90 tablet  3  . metFORMIN (GLUCOPHAGE) 1000 MG tablet TAKE ONE TABLET BY MOUTH TWICE DAILY WITH MEALS  60 tablet  0  . metFORMIN (GLUCOPHAGE) 1000 MG tablet Take 1 tablet (1,000 mg total) by mouth 2 (two) times daily with a meal.  60 tablet  2  . propranolol (INDERAL) 10 MG tablet Take 1 tablet (10 mg total) by mouth 2 (two) times daily.  60 tablet  2  . sitaGLIPtin (JANUVIA) 100 MG tablet Take 1 tablet (100 mg total) by mouth daily.  30 tablet  0  . tamsulosin (FLOMAX) 0.4 MG CAPS capsule Take 1 capsule (0.4 mg total) by mouth daily.  30 capsule  3  .  traMADol (ULTRAM) 50 MG tablet Take 1 tablet (50 mg total) by mouth every 12 (twelve) hours as needed for severe pain.  15 tablet  0  . traZODone (DESYREL) 25 mg TABS tablet Take 0.5 tablets (25 mg total) by mouth at bedtime.  30 tablet  2  . [DISCONTINUED] amitriptyline (ELAVIL) 50 MG tablet Take 1 tablet (50 mg total) by mouth at bedtime.  30 tablet  2  . [DISCONTINUED] imipramine (TOFRANIL) 50 MG tablet Take 4 tablets (200 mg total) by mouth at bedtime.  30 tablet  1    Exam:  BP 147/75  Pulse 73  Temp(Src) 97.6 F (36.4 C) (Oral)  Resp 20  SpO2 100% Gen: Well NAD, seated with right leg raised on exam table for comfort and antalgic gait  GU: Scrotum enlarged and firm, mildly dusky appearing at base, tender bilaterally and at inguinal canals, unable to place finger into inguinal canal due to firmness, right testicle palpated but unable to differentiate left due to overall fullness HEENT: EOMI,  MMM Lungs: Normal work of breathing.  Exts: Non edematous BL  LE, warm and well perfused.   No results found for this or any previous visit (from the past 24 hour(s)). No results found.  Assessment and Plan: Medical decision making:   62 y.o. male with inguinal hernia per reported history and recent enlargement and increase in pain/tenderness with no fever or systemic symptoms. High concern for incarcerated left inguinal hernia. He has gotten to the point of discomfort where he is altering his sitting and gait to not press on scrotum. DDx also includes other testicular pathology like epididymitis though less likely given hx of inguinal hernia. - Will send to Emergency Room for imaging and likely surgical evaluation.      Leona Singleton, MD 07/04/13 [id number]  Leona Singleton, MD 07/04/13 [id number]  Leona Singleton, MD 07/04/13 1000

## 2013-07-04 NOTE — ED Notes (Signed)
Pt informed of Urine sample needed

## 2013-07-04 NOTE — ED Notes (Signed)
C/o inguinal hernia.  States that its getting bigger and protruding more on the right side. Past couple days increase in pain.  States noticed blood in stool last night.  Pt is scheduled January 15 for repair.  Denies fever, n/v/d

## 2013-07-04 NOTE — ED Notes (Signed)
Patient transported to Ultrasound 

## 2013-07-04 NOTE — ED Notes (Signed)
Dr.Knapp at bedside  

## 2013-07-05 ENCOUNTER — Telehealth: Payer: Self-pay | Admitting: Internal Medicine

## 2013-07-05 ENCOUNTER — Telehealth: Payer: Self-pay | Admitting: Emergency Medicine

## 2013-07-05 NOTE — Telephone Encounter (Signed)
Pt called in to get his script filled for traZODone (DESYREL) 25 mg TABS tablet; Pt asked to speak with Noreene Larsson; please contact pt

## 2013-07-05 NOTE — Telephone Encounter (Signed)
Will call scripts in to pharmacy listed

## 2013-07-05 NOTE — Telephone Encounter (Signed)
Left message for pt to call clinic

## 2013-07-06 ENCOUNTER — Other Ambulatory Visit: Payer: Self-pay | Admitting: *Deleted

## 2013-07-06 MED ORDER — TRAZODONE 25 MG HALF TABLET: 25.0000 mg | ORAL_TABLET | Freq: Every day | ORAL | Status: DC

## 2013-07-12 ENCOUNTER — Telehealth: Payer: Self-pay | Admitting: Emergency Medicine

## 2013-07-12 ENCOUNTER — Other Ambulatory Visit: Payer: Self-pay | Admitting: Emergency Medicine

## 2013-07-12 MED ORDER — TRAZODONE 25 MG HALF TABLET: 25.0000 mg | ORAL_TABLET | Freq: Every day | ORAL | Status: DC

## 2013-07-12 MED ORDER — TRAZODONE HCL 50 MG PO TABS: 25.0000 mg | ORAL_TABLET | Freq: Every day | ORAL | Status: DC

## 2013-07-12 NOTE — Telephone Encounter (Signed)
Spoke with pt regarding script for Trazadone. Order scribed to [facility]

## 2013-07-25 ENCOUNTER — Encounter: Payer: Self-pay | Admitting: Internal Medicine

## 2013-07-25 ENCOUNTER — Ambulatory Visit: Payer: No Typology Code available for payment source | Attending: Internal Medicine | Admitting: Internal Medicine

## 2013-07-25 ENCOUNTER — Telehealth: Payer: Self-pay

## 2013-07-25 VITALS — BP 128/84 | HR 72 | Temp 98.0°F | Resp 15 | Wt 207.2 lb

## 2013-07-25 DIAGNOSIS — E785 Hyperlipidemia, unspecified: Secondary | ICD-10-CM

## 2013-07-25 DIAGNOSIS — F419 Anxiety disorder, unspecified: Secondary | ICD-10-CM

## 2013-07-25 DIAGNOSIS — G4700 Insomnia, unspecified: Secondary | ICD-10-CM

## 2013-07-25 DIAGNOSIS — E119 Type 2 diabetes mellitus without complications: Secondary | ICD-10-CM

## 2013-07-25 DIAGNOSIS — F411 Generalized anxiety disorder: Secondary | ICD-10-CM

## 2013-07-25 DIAGNOSIS — I10 Essential (primary) hypertension: Secondary | ICD-10-CM

## 2013-07-25 LAB — POCT GLYCOSYLATED HEMOGLOBIN (HGB A1C): HEMOGLOBIN A1C: 7.2

## 2013-07-25 MED ORDER — FREESTYLE SYSTEM KIT: 1.0000 | PACK | Status: DC | PRN

## 2013-07-25 MED ORDER — FREESTYLE SYSTEM KIT: 1.0000 | PACK | Freq: Three times a day (TID) | Status: DC

## 2013-07-25 MED ORDER — ATORVASTATIN CALCIUM 20 MG PO TABS: 20.0000 mg | ORAL_TABLET | Freq: Every day | ORAL | Status: DC

## 2013-07-25 MED ORDER — METFORMIN HCL 1000 MG PO TABS: ORAL_TABLET | ORAL | Status: DC

## 2013-07-25 MED ORDER — PROPRANOLOL HCL 10 MG PO TABS: 10.0000 mg | ORAL_TABLET | Freq: Two times a day (BID) | ORAL | Status: DC

## 2013-07-25 MED ORDER — LOSARTAN POTASSIUM-HCTZ 50-12.5 MG PO TABS: 1.0000 | ORAL_TABLET | Freq: Every day | ORAL | Status: DC

## 2013-07-25 MED ORDER — GLUCOCOM LANCETS 28G MISC: Status: DC

## 2013-07-25 MED ORDER — LORAZEPAM 1 MG PO TABS: 1.0000 mg | ORAL_TABLET | Freq: Every evening | ORAL | Status: DC | PRN

## 2013-07-25 MED ORDER — TRAZODONE HCL 50 MG PO TABS: 25.0000 mg | ORAL_TABLET | Freq: Every day | ORAL | Status: DC

## 2013-07-25 MED ORDER — PROPRANOLOL HCL 10 MG PO TABS
10.0000 mg | ORAL_TABLET | Freq: Two times a day (BID) | ORAL | Status: DC
Start: 2013-07-25 — End: 2013-07-25

## 2013-07-25 MED ORDER — GLIMEPIRIDE 4 MG PO TABS: 4.0000 mg | ORAL_TABLET | Freq: Every day | ORAL | Status: DC

## 2013-07-25 NOTE — Progress Notes (Signed)
Patient here for medication refills Takes medication for  DM Cholesterol medication

## 2013-07-25 NOTE — Progress Notes (Signed)
MRN: [id number] Name: Garrett Turner  Sex: male Age: 63 y.o. DOB: 04-23-1951  Allergies: Review of patient's allergies indicates no known allergies.  Chief Complaint  Patient presents with  . medication refills    HPI: Patient is 63 y.o. male who comes today for followup her history of diabetes hypertension hyperlipidemia anxiety insomnia, patient is requesting refill on the medications, today his hemoglobin A1c was 7.2% as per patient is compliant with his medications denies any acute symptoms.  Past Medical History  Diagnosis Date  . Diabetes mellitus   . Hypertension   . High cholesterol   . Anxiety disorder   . Pancytopenia   . Inguinal hernia     Left  . Cirrhosis   . UTI (lower urinary tract infection)   . Depression   . Anxiety   . Leg cramps     Past Surgical History  Procedure Laterality Date  . Tonsillectomy    . Esophagogastroduodenoscopy  11/04/2011    Procedure: ESOPHAGOGASTRODUODENOSCOPY (EGD);  Surgeon: Arta Silence, MD;  Location: [facility];  Service: Endoscopy;  Laterality: N/A;  . Colonoscopy  11/04/2011    Procedure: COLONOSCOPY;  Surgeon: Arta Silence, MD;  Location: [facility];  Service: Endoscopy;  Laterality: N/A;  . Vasectomy  1985      Medication List       This list is accurate as of: 07/25/13  3:54 PM.  Always use your most recent med list.               aspirin EC 81 MG tablet  Take 81 mg by mouth daily.     atorvastatin 20 MG tablet  Commonly known as:  LIPITOR  Take 1 tablet (20 mg total) by mouth daily.     betamethasone dipropionate 0.05 % cream  Commonly known as:  DIPROLENE  Apply topically 2 (two) times daily.     glimepiride 4 MG tablet  Commonly known as:  AMARYL  Take 1 tablet (4 mg total) by mouth daily before breakfast.     GlucoCom Lancets Misc  Test blood sugars TID before meals and QHS     glucose monitoring kit monitoring kit  1 each by Does not apply route 4 (four) times daily - after meals and at  bedtime. 1 month Diabetic Testing Supplies for QAC-QHS accuchecks.     glucose monitoring kit monitoring kit  1 each by Does not apply route as needed for other.     HYDROcodone-acetaminophen 5-325 MG per tablet  Commonly known as:  NORCO/VICODIN  Take 1 or 2 po Q 6hrs for pain     HYDROcodone-acetaminophen 5-325 MG per tablet  Commonly known as:  NORCO/VICODIN  Take 1 or 2 po Q 6hrs for pain     HYDROcodone-acetaminophen 5-325 MG per tablet  Commonly known as:  NORCO/VICODIN  Take 1-2 tablets by mouth every 6 (six) hours as needed for moderate pain or severe pain.     LORazepam 1 MG tablet  Commonly known as:  ATIVAN  Take 1 tablet (1 mg total) by mouth at bedtime as needed (sleep).     losartan-hydrochlorothiazide 50-12.5 MG per tablet  Commonly known as:  HYZAAR  Take 1 tablet by mouth daily.     metFORMIN 1000 MG tablet  Commonly known as:  GLUCOPHAGE  TAKE ONE TABLET BY MOUTH TWICE DAILY WITH MEALS     propranolol 10 MG tablet  Commonly known as:  INDERAL  Take 1 tablet (10 mg total) by mouth 2 (two)  times daily.     sulfamethoxazole-trimethoprim 800-160 MG per tablet  Commonly known as:  SEPTRA DS  Take 1 tablet by mouth every 12 (twelve) hours.     sulfamethoxazole-trimethoprim 800-160 MG per tablet  Commonly known as:  SEPTRA DS  Take 1 tablet by mouth every 12 (twelve) hours.     traMADol 50 MG tablet  Commonly known as:  ULTRAM  Take 1 tablet (50 mg total) by mouth every 12 (twelve) hours as needed for severe pain.     traZODone 50 MG tablet  Commonly known as:  DESYREL  Take 0.5 tablets (25 mg total) by mouth at bedtime.        Meds ordered this encounter  Medications  . atorvastatin (LIPITOR) 20 MG tablet    Sig: Take 1 tablet (20 mg total) by mouth daily.    Dispense:  30 tablet    Refill:  1  . glimepiride (AMARYL) 4 MG tablet    Sig: Take 1 tablet (4 mg total) by mouth daily before breakfast.    Dispense:  30 tablet    Refill:  2  .  LORazepam (ATIVAN) 1 MG tablet    Sig: Take 1 tablet (1 mg total) by mouth at bedtime as needed (sleep).    Dispense:  30 tablet    Refill:  5  . DISCONTD: losartan-hydrochlorothiazide (HYZAAR) 50-12.5 MG per tablet    Sig: Take 1 tablet by mouth daily.    Dispense:  90 tablet    Refill:  3  . DISCONTD: metFORMIN (GLUCOPHAGE) 1000 MG tablet    Sig: TAKE ONE TABLET BY MOUTH TWICE DAILY WITH MEALS    Dispense:  60 tablet    Refill:  0  . DISCONTD: propranolol (INDERAL) 10 MG tablet    Sig: Take 1 tablet (10 mg total) by mouth 2 (two) times daily.    Dispense:  60 tablet    Refill:  2  . DISCONTD: traZODone (DESYREL) 50 MG tablet    Sig: Take 0.5 tablets (25 mg total) by mouth at bedtime.    Dispense:  30 tablet    Refill:  0  . DISCONTD: glucose monitoring kit (FREESTYLE) monitoring kit    Sig: 1 each by Does not apply route as needed for other.    Dispense:  1 each    Refill:  0  . DISCONTD: GlucoCom Lancets MISC    Sig: Test blood sugars TID before meals and QHS    Dispense:  100 each    Refill:  2  . losartan-hydrochlorothiazide (HYZAAR) 50-12.5 MG per tablet    Sig: Take 1 tablet by mouth daily.    Dispense:  90 tablet    Refill:  3  . metFORMIN (GLUCOPHAGE) 1000 MG tablet    Sig: TAKE ONE TABLET BY MOUTH TWICE DAILY WITH MEALS    Dispense:  180 tablet    Refill:  2  . propranolol (INDERAL) 10 MG tablet    Sig: Take 1 tablet (10 mg total) by mouth 2 (two) times daily.    Dispense:  180 tablet    Refill:  2  . traZODone (DESYREL) 50 MG tablet    Sig: Take 0.5 tablets (25 mg total) by mouth at bedtime.    Dispense:  30 tablet    Refill:  0  . GlucoCom Lancets MISC    Sig: Test blood sugars TID before meals and QHS    Dispense:  100 each    Refill:  2  .  glucose monitoring kit (FREESTYLE) monitoring kit    Sig: 1 each by Does not apply route 4 (four) times daily - after meals and at bedtime. 1 month Diabetic Testing Supplies for QAC-QHS accuchecks.    Dispense:  1  each    Refill:  1  . glucose monitoring kit (FREESTYLE) monitoring kit    Sig: 1 each by Does not apply route as needed for other.    Dispense:  1 each    Refill:  0    Immunization History  Administered Date(s) Administered  . Influenza Split 04/14/2012, 04/24/2013  . Pneumococcal Polysaccharide-23 04/14/2012, 04/24/2013  . Td 04/04/2012    Family History  Problem Relation Age of Onset  . Coronary artery disease    . Hypertension    . Heart failure Father   . Hypertension Father   . Heart disease Father   . Heart failure Mother   . Hypertension Mother   . Heart disease Mother   . Stroke Paternal Grandmother   . Obesity Daughter     History  Substance Use Topics  . Smoking status: Never Smoker   . Smokeless tobacco: Former Systems developer  . Alcohol Use: 1.1 oz/week    1 Glasses of wine, 1 Drinks containing 0.5 oz of alcohol per week    Review of Systems  As noted in HPI  Filed Vitals:   07/25/13 1512  BP: 128/84  Pulse: 72  Temp: 98 F (36.7 C)  Resp: 15    Physical Exam  Physical Exam  Constitutional: No distress.  Eyes: EOM are normal. Pupils are equal, round, and reactive to light.  Cardiovascular: Normal rate and regular rhythm.   Pulmonary/Chest: Breath sounds normal. No respiratory distress. He has no wheezes. He has no rales.    CBC    Component Value Date/Time   WBC 4.2 10/21/2012 1136   RBC 3.76* 10/21/2012 1136   HGB 11.6* 10/21/2012 1136   HCT 32.2* 10/21/2012 1136   PLT 56* 10/21/2012 1136   MCV 85.6 10/21/2012 1136   LYMPHSABS 0.7 04/13/2012 0555   MONOABS 0.4 04/13/2012 0555   EOSABS 0.0 04/13/2012 0555   BASOSABS 0.0 04/13/2012 0555    CMP     Component Value Date/Time   NA 137 10/20/2012 1628   K 4.3 10/20/2012 1628   CL 106 10/20/2012 1628   CO2 20 10/20/2012 1628   GLUCOSE 129* 10/20/2012 1628   BUN 30* 10/20/2012 1628   CREATININE 1.16 10/20/2012 1628   CALCIUM 9.9 10/20/2012 1628   PROT 7.8 10/20/2012 1628   ALBUMIN 3.5 10/20/2012 1628   AST 43* 10/20/2012  1628   ALT 22 10/20/2012 1628   ALKPHOS 87 10/20/2012 1628   BILITOT 1.0 10/20/2012 1628   GFRNONAA 65* 10/20/2012 1628   GFRAA 76* 10/20/2012 1628    Lab Results  Component Value Date/Time   CHOL 228* 10/20/2012  4:28 PM    No components found with this basename: hga1c    Lab Results  Component Value Date/Time   AST 43* 10/20/2012  4:28 PM    Assessment and Plan  DM (diabetes mellitus) - Plan: PR BLOOD GLUCOSE TEST STRIPS, HgB A1c 7.2% fairly controlled continue with metformin and Amaryl, counseled about dietary modification, he is given the handout for diabetes meal planning.  HTN (hypertension) Well controlled continue with current medication Insomnia Trazodone refill done Anxiety Patient is requesting refill on Lorazepam prescription done Other and unspecified hyperlipidemia Continue with Lipitor Follow up in 3 months Anusha Claus,  Vernon Prey, MD

## 2013-07-25 NOTE — Patient Instructions (Signed)

## 2013-08-16 ENCOUNTER — Ambulatory Visit: Payer: Self-pay | Admitting: Internal Medicine

## 2013-10-17 ENCOUNTER — Emergency Department (INDEPENDENT_AMBULATORY_CARE_PROVIDER_SITE_OTHER): Payer: Self-pay

## 2013-10-17 ENCOUNTER — Encounter (HOSPITAL_COMMUNITY): Payer: Self-pay | Admitting: Emergency Medicine

## 2013-10-17 ENCOUNTER — Telehealth: Payer: Self-pay | Admitting: Internal Medicine

## 2013-10-17 ENCOUNTER — Emergency Department (HOSPITAL_COMMUNITY)
Admission: EM | Admit: 2013-10-17 | Discharge: 2013-10-17 | Disposition: A | Discharge status: Home / Self Care | Payer: Self-pay | Attending: Emergency Medicine | Admitting: Emergency Medicine

## 2013-10-17 ENCOUNTER — Emergency Department (INDEPENDENT_AMBULATORY_CARE_PROVIDER_SITE_OTHER)
Admission: EM | Admit: 2013-10-17 | Discharge: 2013-10-17 | Disposition: A | Discharge status: Home / Self Care | Payer: Self-pay | Source: Home / Self Care | Attending: Family Medicine | Admitting: Family Medicine

## 2013-10-17 DIAGNOSIS — I10 Essential (primary) hypertension: Secondary | ICD-10-CM | POA: Insufficient documentation

## 2013-10-17 DIAGNOSIS — N189 Chronic kidney disease, unspecified: Secondary | ICD-10-CM

## 2013-10-17 DIAGNOSIS — IMO0001 Reserved for inherently not codable concepts without codable children: Secondary | ICD-10-CM | POA: Insufficient documentation

## 2013-10-17 DIAGNOSIS — Z87828 Personal history of other (healed) physical injury and trauma: Secondary | ICD-10-CM | POA: Insufficient documentation

## 2013-10-17 DIAGNOSIS — Z862 Personal history of diseases of the blood and blood-forming organs and certain disorders involving the immune mechanism: Secondary | ICD-10-CM | POA: Insufficient documentation

## 2013-10-17 DIAGNOSIS — Z792 Long term (current) use of antibiotics: Secondary | ICD-10-CM | POA: Insufficient documentation

## 2013-10-17 DIAGNOSIS — Z79899 Other long term (current) drug therapy: Secondary | ICD-10-CM | POA: Insufficient documentation

## 2013-10-17 DIAGNOSIS — R6883 Chills (without fever): Secondary | ICD-10-CM | POA: Insufficient documentation

## 2013-10-17 DIAGNOSIS — R609 Edema, unspecified: Secondary | ICD-10-CM | POA: Insufficient documentation

## 2013-10-17 DIAGNOSIS — F329 Major depressive disorder, single episode, unspecified: Secondary | ICD-10-CM | POA: Insufficient documentation

## 2013-10-17 DIAGNOSIS — F3289 Other specified depressive episodes: Secondary | ICD-10-CM | POA: Insufficient documentation

## 2013-10-17 DIAGNOSIS — E119 Type 2 diabetes mellitus without complications: Secondary | ICD-10-CM | POA: Insufficient documentation

## 2013-10-17 DIAGNOSIS — Z8719 Personal history of other diseases of the digestive system: Secondary | ICD-10-CM | POA: Insufficient documentation

## 2013-10-17 DIAGNOSIS — E7800 Pure hypercholesterolemia, unspecified: Secondary | ICD-10-CM | POA: Insufficient documentation

## 2013-10-17 DIAGNOSIS — Z7982 Long term (current) use of aspirin: Secondary | ICD-10-CM | POA: Insufficient documentation

## 2013-10-17 DIAGNOSIS — Z87440 Personal history of urinary (tract) infections: Secondary | ICD-10-CM | POA: Insufficient documentation

## 2013-10-17 DIAGNOSIS — F411 Generalized anxiety disorder: Secondary | ICD-10-CM | POA: Insufficient documentation

## 2013-10-17 DIAGNOSIS — IMO0002 Reserved for concepts with insufficient information to code with codable children: Secondary | ICD-10-CM | POA: Insufficient documentation

## 2013-10-17 LAB — CBC WITH DIFFERENTIAL/PLATELET
BASOS PCT: 1 % (ref 0–1)
Basophils Absolute: 0 10*3/uL (ref 0.0–0.1)
EOS PCT: 6 % — AB (ref 0–5)
Eosinophils Absolute: 0.2 10*3/uL (ref 0.0–0.7)
HCT: 27.9 % — ABNORMAL LOW (ref 39.0–52.0)
Hemoglobin: 9.7 g/dL — ABNORMAL LOW (ref 13.0–17.0)
LYMPHS PCT: 33 % (ref 12–46)
Lymphs Abs: 1 10*3/uL (ref 0.7–4.0)
MCH: 32.9 pg (ref 26.0–34.0)
MCHC: 34.8 g/dL (ref 30.0–36.0)
MCV: 94.6 fL (ref 78.0–100.0)
MONO ABS: 0.2 10*3/uL (ref 0.1–1.0)
Monocytes Relative: 8 % (ref 3–12)
NEUTROS PCT: 53 % (ref 43–77)
Neutro Abs: 1.6 10*3/uL — ABNORMAL LOW (ref 1.7–7.7)
Platelets: 44 10*3/uL — ABNORMAL LOW (ref 150–400)
RBC: 2.95 MIL/uL — AB (ref 4.22–5.81)
RDW: 15 % (ref 11.5–15.5)
WBC: 3.1 10*3/uL — AB (ref 4.0–10.5)

## 2013-10-17 LAB — URINALYSIS, ROUTINE W REFLEX MICROSCOPIC
Bilirubin Urine: NEGATIVE
Glucose, UA: NEGATIVE mg/dL
Hgb urine dipstick: NEGATIVE
KETONES UR: 15 mg/dL — AB
LEUKOCYTES UA: NEGATIVE
NITRITE: NEGATIVE
PROTEIN: NEGATIVE mg/dL
Specific Gravity, Urine: 1.025 (ref 1.005–1.030)
Urobilinogen, UA: 1 mg/dL (ref 0.0–1.0)
pH: 5.5 (ref 5.0–8.0)

## 2013-10-17 LAB — BASIC METABOLIC PANEL
BUN: 19 mg/dL (ref 6–23)
CALCIUM: 8.8 mg/dL (ref 8.4–10.5)
CHLORIDE: 103 meq/L (ref 96–112)
CO2: 21 mEq/L (ref 19–32)
Creatinine, Ser: 1.23 mg/dL (ref 0.50–1.35)
GFR calc non Af Amer: 60 mL/min — ABNORMAL LOW (ref >90)
GFR, EST AFRICAN AMERICAN: 70 mL/min — AB (ref >90)
Glucose, Bld: 194 mg/dL — ABNORMAL HIGH (ref 70–99)
Potassium: 3.9 mEq/L (ref 3.7–5.3)
Sodium: 141 mEq/L (ref 137–147)

## 2013-10-17 LAB — POCT I-STAT, CHEM 8
BUN: 18 mg/dL (ref 6–23)
CALCIUM ION: 1.1 mmol/L — AB (ref 1.13–1.30)
Chloride: 108 mEq/L (ref 96–112)
Creatinine, Ser: 1.4 mg/dL — ABNORMAL HIGH (ref 0.50–1.35)
Glucose, Bld: 155 mg/dL — ABNORMAL HIGH (ref 70–99)
HEMATOCRIT: 30 % — AB (ref 39.0–52.0)
Hemoglobin: 10.2 g/dL — ABNORMAL LOW (ref 13.0–17.0)
Potassium: 4.3 mEq/L (ref 3.7–5.3)
SODIUM: 142 meq/L (ref 137–147)
TCO2: 21 mmol/L (ref 0–100)

## 2013-10-17 LAB — PRO B NATRIURETIC PEPTIDE: PRO B NATRI PEPTIDE: 121.1 pg/mL (ref 0–125)

## 2013-10-17 NOTE — ED Notes (Signed)
MD at bedside. 

## 2013-10-17 NOTE — Telephone Encounter (Signed)
Pt. Came in for an appt. For swelling on legs....earliest appt. With doctor is on 11/30/13 at 10:15am, which was scheduled per pt....pt. Stated he would go to urgent care, he is diabetic and this has happened before

## 2013-10-17 NOTE — ED Notes (Signed)
Pt from [facility] with c/o bilateral leg swelling w/ pain  x 1 week. Denies SOB. No s/s infection; denies F/C.

## 2013-10-17 NOTE — ED Notes (Signed)
Pt call back # [phone]

## 2013-10-17 NOTE — ED Notes (Signed)
Pt reassured about and updated about the wait. sts that he understands that we are really busy.

## 2013-10-17 NOTE — ED Notes (Signed)
Pt  Reports   Some  Redness  And  Swelling of  The  l lower leg  He  Reports  She  Sustained  An injury       5  Days  Ago  When  He  Was  Using a  chainsaw  And  Has  A  Broken  Area  To  The    Lower     h  Is  Up  To  Date   On  His  Tet  Shot  He  Ambulated to  Room  With a   Steady  Fluid  gait

## 2013-10-17 NOTE — ED Provider Notes (Signed)
CSN: [id number]     Arrival date & time 10/17/13  1125 History   First MD Initiated Contact with Patient 10/17/13 1242     Chief Complaint  Patient presents with  . Leg Swelling   (Consider location/radiation/quality/duration/timing/severity/associated sxs/prior Treatment) Patient is a 63 y.o. male presenting with leg pain. The history is provided by the patient.  Leg Pain Location:  Leg Time since incident:  1 week Injury: yes   Leg location:  L lower leg and R lower leg Pain details:    Quality:  Burning   Severity:  Mild Chronicity:  Chronic (sx for long time, getting worse.) Associated symptoms: numbness, swelling and tingling   Associated symptoms: no fever   Risk factors comment:  DM.   Past Medical History  Diagnosis Date  . Diabetes mellitus   . Hypertension   . High cholesterol   . Anxiety disorder   . Pancytopenia   . Inguinal hernia     Left  . Cirrhosis   . UTI (lower urinary tract infection)   . Depression   . Anxiety   . Leg cramps    Past Surgical History  Procedure Laterality Date  . Tonsillectomy    . Esophagogastroduodenoscopy  11/04/2011    Procedure: ESOPHAGOGASTRODUODENOSCOPY (EGD);  Surgeon: Arta Silence, MD;  Location: [facility];  Service: Endoscopy;  Laterality: N/A;  . Colonoscopy  11/04/2011    Procedure: COLONOSCOPY;  Surgeon: Arta Silence, MD;  Location: [facility];  Service: Endoscopy;  Laterality: N/A;  . Vasectomy  1985   Family History  Problem Relation Age of Onset  . Coronary artery disease    . Hypertension    . Heart failure Father   . Hypertension Father   . Heart disease Father   . Heart failure Mother   . Hypertension Mother   . Heart disease Mother   . Stroke Paternal Grandmother   . Obesity Daughter    History  Substance Use Topics  . Smoking status: Never Smoker   . Smokeless tobacco: Former Systems developer  . Alcohol Use: 1.1 oz/week    1 Glasses of wine, 1 Drinks containing 0.5 oz of alcohol per week    Review  of Systems  Constitutional: Negative.  Negative for fever.  Cardiovascular: Positive for leg swelling.  Skin: Positive for wound.    Allergies  Review of patient's allergies indicates no known allergies.  Home Medications   Current Outpatient Rx  Name  Route  Sig  Dispense  Refill  . aspirin EC 81 MG tablet   Oral   Take 81 mg by mouth daily.         Marland Kitchen atorvastatin (LIPITOR) 20 MG tablet   Oral   Take 20 mg by mouth daily at 6 PM.         . glimepiride (AMARYL) 4 MG tablet   Oral   Take 1 tablet (4 mg total) by mouth daily before breakfast.   30 tablet   2   . LORazepam (ATIVAN) 1 MG tablet   Oral   Take 1 tablet (1 mg total) by mouth at bedtime as needed (sleep).   30 tablet   5   . losartan-hydrochlorothiazide (HYZAAR) 50-12.5 MG per tablet   Oral   Take 1 tablet by mouth daily.   90 tablet   3   . metFORMIN (GLUCOPHAGE) 1000 MG tablet   Oral   Take 1,000 mg by mouth 2 (two) times daily with a meal.         .  propranolol (INDERAL) 10 MG tablet   Oral   Take 1 tablet (10 mg total) by mouth 2 (two) times daily.   180 tablet   2    BP 128/71  Pulse 58  Temp(Src) 97.9 F (36.6 C) (Oral)  Resp 20  SpO2 100% Physical Exam  Nursing note and vitals reviewed. Constitutional: He is oriented to person, place, and time. He appears well-developed and well-nourished.  HENT:  Head: Normocephalic.  Neck: Normal range of motion. Neck supple.  Abdominal: Soft. Bowel sounds are normal.  Musculoskeletal: He exhibits edema and tenderness.  Benign appearing abrasion to left mid tibia with bilat Pitting edema , pulses 2+, no warmth or erythema.  Lymphadenopathy:    He has no cervical adenopathy.  Neurological: He is alert and oriented to person, place, and time.  Skin: Skin is warm and dry.    ED Course  Procedures (including critical care time) Labs Review Labs Reviewed  POCT I-STAT, CHEM 8 - Abnormal; Notable for the following:    Creatinine, Ser 1.40  (*)    Glucose, Bld 155 (*)    Calcium, Ion 1.10 (*)    Hemoglobin 10.2 (*)    HCT 30.0 (*)    All other components within normal limits  PRO B NATRIURETIC PEPTIDE   Imaging Review Dg Chest 2 View  10/17/2013   CLINICAL DATA:  Lower extremity edema  EXAM: CHEST  2 VIEW  COMPARISON:  DG CHEST 2 VIEW dated 06/13/2013  FINDINGS: Low lung volumes The heart size and mediastinal contours are within normal limits. Both lungs are clear. Stable mild linear areas of scarring in the lung bases. The visualized skeletal structures are unremarkable. Chronic compression deformity, status post kyphoplasty, T12.  IMPRESSION: No active cardiopulmonary disease.   Electronically Signed   By: Margaree Mackintosh M.D.   On: 10/17/2013 13:52   X-rays reviewed and report per radiologist.   MDM   1. CRI (chronic renal insufficiency)     Sent for further renal eval of elevated creat, low ca, anemia, edema bilat, diabetes.   Billy Fischer, MD 10/18/13 [phone]

## 2013-10-17 NOTE — Discharge Instructions (Signed)

## 2013-10-17 NOTE — ED Provider Notes (Signed)
CSN: [id number]     Arrival date & time 10/17/13  1452 History   First MD Initiated Contact with Patient 10/17/13 1905     Chief Complaint  Patient presents with  . Leg Swelling     (Consider location/radiation/quality/duration/timing/severity/associated sxs/prior Treatment) Patient is a 63 y.o. male presenting with extremity pain.  Extremity Pain This is a recurrent problem. The current episode started in the past 7 days. The problem occurs constantly. The problem has been waxing and waning. Associated symptoms include chills and myalgias. Pertinent negatives include no fever. The symptoms are aggravated by walking.  Patient with chronic bilateral lower extremity edema, worsening swelling noted over the the last several days, L>R.  States he sustained injury to left mid tibial area 5 days ago, had several days of increased warmth and redness surrounding site.    Past Medical History  Diagnosis Date  . Diabetes mellitus   . Hypertension   . High cholesterol   . Anxiety disorder   . Pancytopenia   . Inguinal hernia     Left  . Cirrhosis   . UTI (lower urinary tract infection)   . Depression   . Anxiety   . Leg cramps    Past Surgical History  Procedure Laterality Date  . Tonsillectomy    . Esophagogastroduodenoscopy  11/04/2011    Procedure: ESOPHAGOGASTRODUODENOSCOPY (EGD);  Surgeon: Arta Silence, MD;  Location: [facility];  Service: Endoscopy;  Laterality: N/A;  . Colonoscopy  11/04/2011    Procedure: COLONOSCOPY;  Surgeon: Arta Silence, MD;  Location: [facility];  Service: Endoscopy;  Laterality: N/A;  . Vasectomy  1985   Family History  Problem Relation Age of Onset  . Coronary artery disease    . Hypertension    . Heart failure Father   . Hypertension Father   . Heart disease Father   . Heart failure Mother   . Hypertension Mother   . Heart disease Mother   . Stroke Paternal Grandmother   . Obesity Daughter    History  Substance Use Topics  . Smoking  status: Never Smoker   . Smokeless tobacco: Former Systems developer  . Alcohol Use: 1.1 oz/week    1 Glasses of wine, 1 Drinks containing 0.5 oz of alcohol per week    Review of Systems  Constitutional: Positive for chills. Negative for fever.  Cardiovascular: Positive for leg swelling.  Endocrine: Positive for cold intolerance.  Genitourinary: Positive for frequency.  Musculoskeletal: Positive for myalgias.  All other systems reviewed and are negative.      Allergies  Review of patient's allergies indicates no known allergies.  Home Medications   Current Outpatient Rx  Name  Route  Sig  Dispense  Refill  . aspirin EC 81 MG tablet   Oral   Take 81 mg by mouth daily.         Marland Kitchen atorvastatin (LIPITOR) 20 MG tablet   Oral   Take 1 tablet (20 mg total) by mouth daily.   30 tablet   1   . betamethasone dipropionate (DIPROLENE) 0.05 % cream   Topical   Apply topically 2 (two) times daily.         Marland Kitchen glimepiride (AMARYL) 4 MG tablet   Oral   Take 1 tablet (4 mg total) by mouth daily before breakfast.   30 tablet   2   . GlucoCom Lancets MISC      Test blood sugars TID before meals and QHS   100 each  2   . glucose monitoring kit (FREESTYLE) monitoring kit   Does not apply   1 each by Does not apply route 4 (four) times daily - after meals and at bedtime. 1 month Diabetic Testing Supplies for QAC-QHS accuchecks.   1 each   1   . glucose monitoring kit (FREESTYLE) monitoring kit   Does not apply   1 each by Does not apply route as needed for other.   1 each   0   . HYDROcodone-acetaminophen (NORCO/VICODIN) 5-325 MG per tablet   Oral   Take 1-2 tablets by mouth every 6 (six) hours as needed for moderate pain or severe pain.         Marland Kitchen LORazepam (ATIVAN) 1 MG tablet   Oral   Take 1 tablet (1 mg total) by mouth at bedtime as needed (sleep).   30 tablet   5   . losartan-hydrochlorothiazide (HYZAAR) 50-12.5 MG per tablet   Oral   Take 1 tablet by mouth daily.    90 tablet   3   . metFORMIN (GLUCOPHAGE) 1000 MG tablet      TAKE ONE TABLET BY MOUTH TWICE DAILY WITH MEALS   180 tablet   2   . propranolol (INDERAL) 10 MG tablet   Oral   Take 1 tablet (10 mg total) by mouth 2 (two) times daily.   180 tablet   2   . sulfamethoxazole-trimethoprim (SEPTRA DS) 800-160 MG per tablet   Oral   Take 1 tablet by mouth every 12 (twelve) hours.   20 tablet   0   . sulfamethoxazole-trimethoprim (SEPTRA DS) 800-160 MG per tablet   Oral   Take 1 tablet by mouth every 12 (twelve) hours.   20 tablet   0   . traMADol (ULTRAM) 50 MG tablet   Oral   Take 1 tablet (50 mg total) by mouth every 12 (twelve) hours as needed for severe pain.   15 tablet   0   . traZODone (DESYREL) 50 MG tablet   Oral   Take 0.5 tablets (25 mg total) by mouth at bedtime.   30 tablet   0    BP 133/81  Pulse 61  Temp(Src) 97.5 F (36.4 C) (Oral)  Resp 18  SpO2 99% Physical Exam  Nursing note and vitals reviewed. Constitutional: He is oriented to person, place, and time. He appears well-developed and well-nourished.  HENT:  Head: Normocephalic and atraumatic.  Eyes: Pupils are equal, round, and reactive to light.  Neck: Normal range of motion.  Cardiovascular: Normal rate and regular rhythm.   Pulmonary/Chest: Effort normal and breath sounds normal.  Abdominal: Soft. Bowel sounds are normal.  Musculoskeletal: He exhibits edema and tenderness.  Lymphadenopathy:    He has no cervical adenopathy.  Neurological: He is alert and oriented to person, place, and time.  Skin: Skin is warm and dry.  Psychiatric: He has a normal mood and affect. His behavior is normal. Judgment and thought content normal.  Benign appearing wound to anterior aspect of left mid-tibial area.  Minimal erythema, not hot to touch.  ED Course  Procedures (including critical care time) Labs Review Labs Reviewed  CBC WITH DIFFERENTIAL - Abnormal; Notable for the following:    WBC 3.1 (*)     RBC 2.95 (*)    Hemoglobin 9.7 (*)    HCT 27.9 (*)    Neutro Abs 1.6 (*)    Eosinophils Relative 6 (*)    All other components  within normal limits  BASIC METABOLIC PANEL  URINALYSIS, ROUTINE W REFLEX MICROSCOPIC   Imaging Review Dg Chest 2 View  10/17/2013   CLINICAL DATA:  Lower extremity edema  EXAM: CHEST  2 VIEW  COMPARISON:  DG CHEST 2 VIEW dated 06/13/2013  FINDINGS: Low lung volumes The heart size and mediastinal contours are within normal limits. Both lungs are clear. Stable mild linear areas of scarring in the lung bases. The visualized skeletal structures are unremarkable. Chronic compression deformity, status post kyphoplasty, T12.  IMPRESSION: No active cardiopulmonary disease.   Electronically Signed   By: Margaree Mackintosh M.D.   On: 10/17/2013 13:52     EKG Interpretation None     Patient evaluated at urgent care earlier today for exacerbation of bilateral lower leg swelling.  Patient noted to have mildly increased creatinine and mildly decreased calcium.  Sent to ED for "further renal eval of elevated creat, low ca, anemia, edema bilat, diabetes."  Labs rechecked.  BUN, Creatinine, Calcium normal.  Chronic anemia/pancytopenia.  Benign appearing wound on left lower leg, does not appear cellulitic or infected.  Patient discussed with and seen by Dr. Regenia Skeeter.  MDM   Final diagnoses:  None    Peripheral edema.    Norman Herrlich, NP 10/18/13 [phone]

## 2013-10-17 NOTE — ED Notes (Signed)
PA at bedside.

## 2013-10-18 NOTE — ED Provider Notes (Signed)
Medical screening examination/treatment/procedure(s) were conducted as a shared visit with non-physician practitioner(s) and myself.  I personally evaluated the patient during the encounter.   EKG Interpretation None      Patient's repeat labs here are benign. Initial Cr elevation at UC likely from [place] lab error. Leg swelling is chronic, no chest symptoms. Stable for discharge with outpatient follow up.  Ephraim Hamburger, MD 10/18/13 [phone]

## 2013-11-30 ENCOUNTER — Ambulatory Visit: Payer: Self-pay | Admitting: Internal Medicine

## 2014-01-06 ENCOUNTER — Other Ambulatory Visit: Payer: Self-pay | Admitting: Internal Medicine

## 2014-01-06 DIAGNOSIS — E785 Hyperlipidemia, unspecified: Secondary | ICD-10-CM

## 2014-02-05 ENCOUNTER — Telehealth: Payer: Self-pay | Admitting: Internal Medicine

## 2014-02-05 ENCOUNTER — Other Ambulatory Visit: Payer: Self-pay | Admitting: Internal Medicine

## 2014-02-05 NOTE — Telephone Encounter (Signed)
Pt calling for LORazepam (ATIVAN) 1 MG tablet refill, pt has appt on 02/14/14. Pt states that he has not used all refills yet but that because they were written 6 mo ago the refills have expired.  Pt uses [facility] on [address], pt has to go into work but please LVM for pt if necessary.  Please f/u with pt asap.

## 2014-02-06 ENCOUNTER — Telehealth: Payer: Self-pay

## 2014-02-06 ENCOUNTER — Other Ambulatory Visit: Payer: Self-pay | Admitting: Internal Medicine

## 2014-02-06 NOTE — Telephone Encounter (Signed)
Spoke with patient and he is aware he can pick up his prescription at the front desk

## 2014-02-06 NOTE — Telephone Encounter (Signed)
Pt calling back. Says he has not slept in 3 days. Please f/u with pt.

## 2014-02-06 NOTE — Telephone Encounter (Signed)
Patient requesting refill on his ativan Can we refill this medication to help him sleep?

## 2014-02-14 ENCOUNTER — Encounter: Payer: Self-pay | Admitting: Internal Medicine

## 2014-02-14 ENCOUNTER — Ambulatory Visit: Payer: Medicare Other | Attending: Internal Medicine | Admitting: Internal Medicine

## 2014-02-14 VITALS — BP 139/83 | HR 66 | Temp 97.8°F | Resp 16 | Wt 205.8 lb

## 2014-02-14 DIAGNOSIS — G4700 Insomnia, unspecified: Secondary | ICD-10-CM | POA: Diagnosis not present

## 2014-02-14 DIAGNOSIS — K7460 Unspecified cirrhosis of liver: Secondary | ICD-10-CM | POA: Diagnosis not present

## 2014-02-14 DIAGNOSIS — F3289 Other specified depressive episodes: Secondary | ICD-10-CM | POA: Insufficient documentation

## 2014-02-14 DIAGNOSIS — D649 Anemia, unspecified: Secondary | ICD-10-CM | POA: Diagnosis not present

## 2014-02-14 DIAGNOSIS — Z7982 Long term (current) use of aspirin: Secondary | ICD-10-CM | POA: Diagnosis not present

## 2014-02-14 DIAGNOSIS — R609 Edema, unspecified: Secondary | ICD-10-CM | POA: Insufficient documentation

## 2014-02-14 DIAGNOSIS — E089 Diabetes mellitus due to underlying condition without complications: Secondary | ICD-10-CM

## 2014-02-14 DIAGNOSIS — E119 Type 2 diabetes mellitus without complications: Secondary | ICD-10-CM | POA: Diagnosis present

## 2014-02-14 DIAGNOSIS — F411 Generalized anxiety disorder: Secondary | ICD-10-CM | POA: Diagnosis not present

## 2014-02-14 DIAGNOSIS — E785 Hyperlipidemia, unspecified: Secondary | ICD-10-CM

## 2014-02-14 DIAGNOSIS — R21 Rash and other nonspecific skin eruption: Secondary | ICD-10-CM | POA: Insufficient documentation

## 2014-02-14 DIAGNOSIS — F329 Major depressive disorder, single episode, unspecified: Secondary | ICD-10-CM | POA: Diagnosis not present

## 2014-02-14 DIAGNOSIS — E139 Other specified diabetes mellitus without complications: Secondary | ICD-10-CM

## 2014-02-14 LAB — COMPLETE METABOLIC PANEL WITH GFR
ALT: 30 U/L (ref 0–53)
AST: 48 U/L — AB (ref 0–37)
Albumin: 3.3 g/dL — ABNORMAL LOW (ref 3.5–5.2)
Alkaline Phosphatase: 77 U/L (ref 39–117)
BILIRUBIN TOTAL: 2.1 mg/dL — AB (ref 0.2–1.2)
BUN: 24 mg/dL — ABNORMAL HIGH (ref 6–23)
CO2: 22 mEq/L (ref 19–32)
CREATININE: 1.25 mg/dL (ref 0.50–1.35)
Calcium: 8.7 mg/dL (ref 8.4–10.5)
Chloride: 107 mEq/L (ref 96–112)
GFR, EST AFRICAN AMERICAN: 69 mL/min
GFR, Est Non African American: 60 mL/min
Glucose, Bld: 88 mg/dL (ref 70–99)
Potassium: 4.4 mEq/L (ref 3.5–5.3)
Sodium: 138 mEq/L (ref 135–145)
Total Protein: 6.6 g/dL (ref 6.0–8.3)

## 2014-02-14 LAB — ANEMIA PANEL
%SAT: 23 % (ref 20–55)
ABS Retic: 52.2 10*3/uL (ref 19.0–186.0)
FOLATE: 9.1 ng/mL
Ferritin: 26 ng/mL (ref 22–322)
Iron: 79 ug/dL (ref 42–165)
RBC.: 3.07 MIL/uL — ABNORMAL LOW (ref 4.22–5.81)
Retic Ct Pct: 1.7 % (ref 0.4–2.3)
TIBC: 349 ug/dL (ref 215–435)
UIBC: 270 ug/dL (ref 125–400)
Vitamin B-12: 626 pg/mL (ref 211–911)

## 2014-02-14 LAB — POCT GLYCOSYLATED HEMOGLOBIN (HGB A1C): Hemoglobin A1C: 5.7

## 2014-02-14 LAB — LIPID PANEL
Cholesterol: 229 mg/dL — ABNORMAL HIGH (ref 0–200)
HDL: 41 mg/dL (ref >39)
LDL CALC: 162 mg/dL — AB (ref 0–99)
Total CHOL/HDL Ratio: 5.6 Ratio
Triglycerides: 129 mg/dL (ref <150)
VLDL: 26 mg/dL (ref 0–40)

## 2014-02-14 MED ORDER — METFORMIN HCL 1000 MG PO TABS: 1000.0000 mg | ORAL_TABLET | Freq: Two times a day (BID) | ORAL | Status: DC

## 2014-02-14 MED ORDER — LOSARTAN POTASSIUM-HCTZ 50-12.5 MG PO TABS: 1.0000 | ORAL_TABLET | Freq: Every day | ORAL | Status: DC

## 2014-02-14 MED ORDER — PROPRANOLOL HCL 10 MG PO TABS: 10.0000 mg | ORAL_TABLET | Freq: Two times a day (BID) | ORAL | Status: DC

## 2014-02-14 MED ORDER — TRAZODONE HCL 50 MG PO TABS: 25.0000 mg | ORAL_TABLET | Freq: Every evening | ORAL | Status: DC | PRN

## 2014-02-14 MED ORDER — ATORVASTATIN CALCIUM 20 MG PO TABS: ORAL_TABLET | ORAL | Status: DC

## 2014-02-14 NOTE — Progress Notes (Signed)
Patient complains of legs swell with redness, numbness Random joint pain, and follow up on his diabetes

## 2014-02-14 NOTE — Progress Notes (Signed)
MRN: [id number] Name: Garrett Turner  Sex: male Age: 63 y.o. DOB: 1951-02-11  Allergies: Review of patient's allergies indicates no known allergies.  Chief Complaint  Patient presents with  . Follow-up  . Hand Pain  . Leg Swelling    HPI: Patient is 63 y.o. male who has to of hypertension diabetes, hyperlipidemia GERD cirrhosis, anxiety insomnia comes today for follow up, patient reported to have occasional low blood sugar levels, his hemoglobin A1c is 5.7% currently patient is on glyburide as well as metformin, patient also reported to have noticed low lower leg swelling denies any orthopnea or PND, he also has a chronic rash on his left leg which is not itchy, previous blood work reviewed noticed anemia, patient also had a colonoscopy done in 2013 and the polyp was removed, patient denies any bleeding per rectum.  Past Medical History  Diagnosis Date  . Diabetes mellitus   . Hypertension   . High cholesterol   . Anxiety disorder   . Pancytopenia   . Inguinal hernia     Left  . Cirrhosis   . UTI (lower urinary tract infection)   . Depression   . Anxiety   . Leg cramps     Past Surgical History  Procedure Laterality Date  . Tonsillectomy    . Esophagogastroduodenoscopy  11/04/2011    Procedure: ESOPHAGOGASTRODUODENOSCOPY (EGD);  Surgeon: Arta Silence, MD;  Location: [facility];  Service: Endoscopy;  Laterality: N/A;  . Colonoscopy  11/04/2011    Procedure: COLONOSCOPY;  Surgeon: Arta Silence, MD;  Location: [facility];  Service: Endoscopy;  Laterality: N/A;  . Vasectomy  1985      Medication List       This list is accurate as of: 02/14/14 10:02 AM.  Always use your most recent med list.               aspirin EC 81 MG tablet  Take 81 mg by mouth daily.     atorvastatin 20 MG tablet  Commonly known as:  LIPITOR  TAKE ONE TABLET BY MOUTH ONCE DAILY     glimepiride 4 MG tablet  Commonly known as:  AMARYL  Take 1 tablet (4 mg total) by mouth daily  before breakfast.     LORazepam 1 MG tablet  Commonly known as:  ATIVAN  TAKE ONE TABLET BY MOUTH AT BEDTIME AS NEEDED FOR SLEEP     losartan-hydrochlorothiazide 50-12.5 MG per tablet  Commonly known as:  HYZAAR  Take 1 tablet by mouth daily.     metFORMIN 1000 MG tablet  Commonly known as:  GLUCOPHAGE  Take 1 tablet (1,000 mg total) by mouth 2 (two) times daily with a meal.     propranolol 10 MG tablet  Commonly known as:  INDERAL  Take 1 tablet (10 mg total) by mouth 2 (two) times daily.     traZODone 50 MG tablet  Commonly known as:  DESYREL  Take 0.5 tablets (25 mg total) by mouth at bedtime as needed for sleep.        Meds ordered this encounter  Medications  . atorvastatin (LIPITOR) 20 MG tablet    Sig: TAKE ONE TABLET BY MOUTH ONCE DAILY    Dispense:  30 tablet    Refill:  0  . losartan-hydrochlorothiazide (HYZAAR) 50-12.5 MG per tablet    Sig: Take 1 tablet by mouth daily.    Dispense:  90 tablet    Refill:  3  . metFORMIN (GLUCOPHAGE) 1000 MG  tablet    Sig: Take 1 tablet (1,000 mg total) by mouth 2 (two) times daily with a meal.    Dispense:  60 tablet    Refill:  3  . propranolol (INDERAL) 10 MG tablet    Sig: Take 1 tablet (10 mg total) by mouth 2 (two) times daily.    Dispense:  180 tablet    Refill:  2  . traZODone (DESYREL) 50 MG tablet    Sig: Take 0.5 tablets (25 mg total) by mouth at bedtime as needed for sleep.    Dispense:  30 tablet    Refill:  3    Immunization History  Administered Date(s) Administered  . Influenza Split 04/14/2012, 04/24/2013  . Pneumococcal Polysaccharide-23 04/14/2012, 04/24/2013  . Td 04/04/2012    Family History  Problem Relation Age of Onset  . Coronary artery disease    . Hypertension    . Heart failure Father   . Hypertension Father   . Heart disease Father   . Heart failure Mother   . Hypertension Mother   . Heart disease Mother   . Stroke Paternal Grandmother   . Obesity Daughter     History    Substance Use Topics  . Smoking status: Never Smoker   . Smokeless tobacco: Former Systems developer  . Alcohol Use: 1.1 oz/week    1 Glasses of wine, 1 Drinks containing 0.5 oz of alcohol per week    Review of Systems   As noted in HPI  Filed Vitals:   02/14/14 0924  BP: 139/83  Pulse: 66  Temp: 97.8 F (36.6 C)  Resp: 16    Physical Exam  Physical Exam  Constitutional: No distress.  HENT:  Head: Normocephalic and atraumatic.  Eyes: EOM are normal. Pupils are equal, round, and reactive to light.  Cardiovascular: Normal rate and regular rhythm.   Pulmonary/Chest: Breath sounds normal. No respiratory distress. He has no wheezes. He has no rales.  Abdominal: Soft.  Musculoskeletal:  1+ pedal edema   Skin:  Dry scally rash on left leg     CBC    Component Value Date/Time   WBC 3.1* 10/17/2013 1929   RBC 2.95* 10/17/2013 1929   HGB 9.7* 10/17/2013 1929   HCT 27.9* 10/17/2013 1929   PLT 44* 10/17/2013 1929   MCV 94.6 10/17/2013 1929   LYMPHSABS 1.0 10/17/2013 1929   MONOABS 0.2 10/17/2013 1929   EOSABS 0.2 10/17/2013 1929   BASOSABS 0.0 10/17/2013 1929    CMP     Component Value Date/Time   NA 141 10/17/2013 1945   K 3.9 10/17/2013 1945   CL 103 10/17/2013 1945   CO2 21 10/17/2013 1945   GLUCOSE 194* 10/17/2013 1945   BUN 19 10/17/2013 1945   CREATININE 1.23 10/17/2013 1945   CALCIUM 8.8 10/17/2013 1945   PROT 7.8 10/20/2012 1628   ALBUMIN 3.5 10/20/2012 1628   AST 43* 10/20/2012 1628   ALT 22 10/20/2012 1628   ALKPHOS 87 10/20/2012 1628   BILITOT 1.0 10/20/2012 1628   GFRNONAA 60* 10/17/2013 1945   GFRAA 70* 10/17/2013 1945    Lab Results  Component Value Date/Time   CHOL 228* 10/20/2012  4:28 PM    No components found with this basename: hga1c    Lab Results  Component Value Date/Time   AST 43* 10/20/2012  4:28 PM    Assessment and Plan  Diabetes mellitus due to underlying condition without complications - Plan: Results for orders placed in visit on  02/14/14  POCT GLYCOSYLATED  HEMOGLOBIN (HGB A1C)      Result Value Ref Range   Hemoglobin A1C 5.7     Diabetes is too tightly controlled, I have discontinued glyburide, patient will continue with metformin, repeat blood chemistry COMPLETE METABOLIC PANEL WITH GFR  Other and unspecified hyperlipidemia - Plan: Continue with her atorvastatin (LIPITOR) 20 MG tablet, will repeat her Lipid panel  Anemia, unspecified - Plan: Patient is up-to-date with colonoscopy and polyp removed 2 years ago, will check Anemia panel  Rash and nonspecific skin eruption - Plan: Ambulatory referral to Dermatology  Dependent edema Advise patient for low salt diet leg elevation compression stockings.  Insomnia - Plan: Have advised patient to avoid using lorazepam since he has history of cirrhosis, he'll take traZODone (DESYREL) 50 MG tablet for insomnia also advise he can try melatonin over-the-counter.   Health Maintenance -Colonoscopy: uptodate 2013  Return in about 3 months (around 05/17/2014) for diabetes, hypertension.  Lorayne Marek, MD

## 2014-02-15 ENCOUNTER — Telehealth: Payer: Self-pay

## 2014-02-15 NOTE — Telephone Encounter (Signed)
Patient returned phone call and is aware of his lab results  

## 2014-02-15 NOTE — Telephone Encounter (Signed)
Message copied by Dorothe Pea on Thu Feb 15, 2014  2:36 PM ------      Message from: Lorayne Marek      Created: Thu Feb 15, 2014  9:41 AM       Blood work reviewed call and let the patient know that his cholesterol is still elevated, patient is already on Lipitor, advise patient for low fat diet, will repeat lipid panel on the next visit. ------

## 2014-02-15 NOTE — Telephone Encounter (Signed)
Patient not available Left message on voice mail to return our call 

## 2014-02-15 NOTE — Telephone Encounter (Signed)
Pt returning call for nurse. Please f/u with pt.  °

## 2014-03-20 ENCOUNTER — Other Ambulatory Visit: Payer: Self-pay | Admitting: Internal Medicine

## 2014-03-20 DIAGNOSIS — E785 Hyperlipidemia, unspecified: Secondary | ICD-10-CM

## 2014-03-22 ENCOUNTER — Telehealth: Payer: Self-pay | Admitting: Internal Medicine

## 2014-03-22 ENCOUNTER — Other Ambulatory Visit: Payer: Self-pay

## 2014-03-22 MED ORDER — LORAZEPAM 1 MG PO TABS: ORAL_TABLET | ORAL | Status: DC

## 2014-03-22 NOTE — Telephone Encounter (Signed)
Pt calling regarding LORazepam (ATIVAN) 1 MG tablet refill, says he called a couple of days ago and has not received call back. Please f/u with pt.

## 2014-03-27 NOTE — Telephone Encounter (Signed)
Medication reordered per Dr. Annitta Needs

## 2014-04-19 ENCOUNTER — Telehealth: Payer: Self-pay | Admitting: Internal Medicine

## 2014-04-19 NOTE — Telephone Encounter (Signed)
Pt. Called to request a med refill for LORazepam (ATIVAN) 1 MG tablet. Please f/u with pt.

## 2014-04-19 NOTE — Telephone Encounter (Signed)
Pt requesting medication refill Ativan. Please f/u Last given 03/22/14

## 2014-04-20 ENCOUNTER — Telehealth: Payer: Self-pay | Admitting: Emergency Medicine

## 2014-04-20 NOTE — Telephone Encounter (Signed)
Pt informed Dr. Annitta Needs will not refill Ativan but needs to continue Trazodone for sleep or try otc Melatonin

## 2014-04-20 NOTE — Telephone Encounter (Signed)
It was discussed with the patient on the last visit that he should avoid taking lorazepam since he has history of cirrhosis, he can continue with traZODone (DESYREL) 50 MG tablet for insomnia also advise he can try melatonin over-the-counter.

## 2014-05-02 ENCOUNTER — Encounter (HOSPITAL_BASED_OUTPATIENT_CLINIC_OR_DEPARTMENT_OTHER): Payer: Self-pay | Admitting: Emergency Medicine

## 2014-05-02 ENCOUNTER — Emergency Department (HOSPITAL_BASED_OUTPATIENT_CLINIC_OR_DEPARTMENT_OTHER): Payer: Medicare Other

## 2014-05-02 ENCOUNTER — Emergency Department (HOSPITAL_BASED_OUTPATIENT_CLINIC_OR_DEPARTMENT_OTHER)
Admission: EM | Admit: 2014-05-02 | Discharge: 2014-05-03 | Disposition: A | Discharge status: Home / Self Care | Payer: Medicare Other | Attending: Emergency Medicine | Admitting: Emergency Medicine

## 2014-05-02 DIAGNOSIS — K8050 Calculus of bile duct without cholangitis or cholecystitis without obstruction: Secondary | ICD-10-CM | POA: Insufficient documentation

## 2014-05-02 DIAGNOSIS — I10 Essential (primary) hypertension: Secondary | ICD-10-CM | POA: Insufficient documentation

## 2014-05-02 DIAGNOSIS — F419 Anxiety disorder, unspecified: Secondary | ICD-10-CM | POA: Diagnosis not present

## 2014-05-02 DIAGNOSIS — E780 Pure hypercholesterolemia: Secondary | ICD-10-CM | POA: Diagnosis not present

## 2014-05-02 DIAGNOSIS — Z862 Personal history of diseases of the blood and blood-forming organs and certain disorders involving the immune mechanism: Secondary | ICD-10-CM | POA: Insufficient documentation

## 2014-05-02 DIAGNOSIS — F329 Major depressive disorder, single episode, unspecified: Secondary | ICD-10-CM | POA: Diagnosis not present

## 2014-05-02 DIAGNOSIS — Z8719 Personal history of other diseases of the digestive system: Secondary | ICD-10-CM | POA: Insufficient documentation

## 2014-05-02 DIAGNOSIS — R109 Unspecified abdominal pain: Secondary | ICD-10-CM

## 2014-05-02 DIAGNOSIS — Z87440 Personal history of urinary (tract) infections: Secondary | ICD-10-CM | POA: Diagnosis not present

## 2014-05-02 DIAGNOSIS — Z7982 Long term (current) use of aspirin: Secondary | ICD-10-CM | POA: Diagnosis not present

## 2014-05-02 DIAGNOSIS — E119 Type 2 diabetes mellitus without complications: Secondary | ICD-10-CM | POA: Diagnosis not present

## 2014-05-02 DIAGNOSIS — Z79899 Other long term (current) drug therapy: Secondary | ICD-10-CM | POA: Diagnosis not present

## 2014-05-02 LAB — CBC WITH DIFFERENTIAL/PLATELET
BASOS ABS: 0 10*3/uL (ref 0.0–0.1)
Basophils Relative: 0 % (ref 0–1)
EOS PCT: 2 % (ref 0–5)
Eosinophils Absolute: 0.2 10*3/uL (ref 0.0–0.7)
HEMATOCRIT: 29.4 % — AB (ref 39.0–52.0)
Hemoglobin: 10.2 g/dL — ABNORMAL LOW (ref 13.0–17.0)
Lymphocytes Relative: 10 % — ABNORMAL LOW (ref 12–46)
Lymphs Abs: 0.6 10*3/uL — ABNORMAL LOW (ref 0.7–4.0)
MCH: 32.2 pg (ref 26.0–34.0)
MCHC: 34.7 g/dL (ref 30.0–36.0)
MCV: 92.7 fL (ref 78.0–100.0)
Monocytes Absolute: 0.2 10*3/uL (ref 0.1–1.0)
Monocytes Relative: 2 % — ABNORMAL LOW (ref 3–12)
NEUTROS ABS: 5.5 10*3/uL (ref 1.7–7.7)
Neutrophils Relative %: 85 % — ABNORMAL HIGH (ref 43–77)
Platelets: 102 10*3/uL — ABNORMAL LOW (ref 150–400)
RBC: 3.17 MIL/uL — ABNORMAL LOW (ref 4.22–5.81)
RDW: 14.9 % (ref 11.5–15.5)
WBC: 6.5 10*3/uL (ref 4.0–10.5)

## 2014-05-02 LAB — COMPREHENSIVE METABOLIC PANEL
ALT: 27 U/L (ref 0–53)
AST: 58 U/L — AB (ref 0–37)
Albumin: 3.2 g/dL — ABNORMAL LOW (ref 3.5–5.2)
Alkaline Phosphatase: 107 U/L (ref 39–117)
Anion gap: 18 — ABNORMAL HIGH (ref 5–15)
BUN: 16 mg/dL (ref 6–23)
CALCIUM: 9.1 mg/dL (ref 8.4–10.5)
CHLORIDE: 100 meq/L (ref 96–112)
CO2: 19 mEq/L (ref 19–32)
Creatinine, Ser: 1.2 mg/dL (ref 0.50–1.35)
GFR calc Af Amer: 72 mL/min — ABNORMAL LOW (ref >90)
GFR calc non Af Amer: 62 mL/min — ABNORMAL LOW (ref >90)
Glucose, Bld: 166 mg/dL — ABNORMAL HIGH (ref 70–99)
Potassium: 4.4 mEq/L (ref 3.7–5.3)
Sodium: 137 mEq/L (ref 137–147)
TOTAL PROTEIN: 7 g/dL (ref 6.0–8.3)
Total Bilirubin: 2.3 mg/dL — ABNORMAL HIGH (ref 0.3–1.2)

## 2014-05-02 LAB — LIPASE, BLOOD: LIPASE: 46 U/L (ref 11–59)

## 2014-05-02 MED ORDER — SODIUM CHLORIDE 0.9 % IV SOLN: Freq: Once | INTRAVENOUS | Status: DC

## 2014-05-02 MED ORDER — SODIUM CHLORIDE 0.9 % IV BOLUS (SEPSIS)
1000.0000 mL | Freq: Once | INTRAVENOUS | Status: AC
  Administered 2014-05-02: 1000 mL via INTRAVENOUS

## 2014-05-02 MED ORDER — HYDROMORPHONE HCL 1 MG/ML IJ SOLN
1.0000 mg | INTRAMUSCULAR | Status: AC | PRN
  Administered 2014-05-02 (×2): 1 mg via INTRAVENOUS
  Filled 2014-05-02 (×3): qty 1

## 2014-05-02 MED ORDER — FENTANYL CITRATE 0.05 MG/ML IJ SOLN: 50.0000 ug | Freq: Once | INTRAMUSCULAR | Status: AC

## 2014-05-02 MED ORDER — FENTANYL CITRATE 0.05 MG/ML IJ SOLN
INTRAMUSCULAR | Status: AC
  Administered 2014-05-02: 50 ug
  Filled 2014-05-02: qty 2

## 2014-05-02 MED ORDER — IOHEXOL 300 MG/ML  SOLN
50.0000 mL | Freq: Once | INTRAMUSCULAR | Status: AC | PRN
  Administered 2014-05-02: 50 mL via ORAL

## 2014-05-02 MED ORDER — IOHEXOL 300 MG/ML  SOLN
100.0000 mL | Freq: Once | INTRAMUSCULAR | Status: AC | PRN
  Administered 2014-05-02: 100 mL via INTRAVENOUS

## 2014-05-02 NOTE — ED Provider Notes (Signed)
CSN: [id number]     Arrival date & time 05/02/14  1819 History  This chart was scribed for Tanna Furry, MD by Einar Pheasant, ED Scribe. This patient was seen in room MH07/MH07 and the patient's care was started at 6:48 PM.      Chief Complaint  Patient presents with  . Abdominal Pain   The history is provided by the patient. No language interpreter was used.   HPI Comments: Garrett Turner is a 63 y.o. male who presents to the Emergency Department complaining of acute onset worsening right sided abdominal pain that started today at 4:30 PM. Pt states that he was getting ready for work when the pain started. He states that he had a glass of milk and ate some cheese thinking that the abdominal pain was being caused by not having had a meal. Mr. Luckett describes the pains as waxing and waning and worsened by deep breathing and movement. He denies any hx of similar episodes. Pt has a hx of Inguinal hernia on the left. He is also complaining of bilateral pain that has been ongoing.   Past Medical History  Diagnosis Date  . Diabetes mellitus   . Hypertension   . High cholesterol   . Anxiety disorder   . Pancytopenia   . Inguinal hernia     Left  . Cirrhosis   . UTI (lower urinary tract infection)   . Depression   . Anxiety   . Leg cramps    Past Surgical History  Procedure Laterality Date  . Tonsillectomy    . Esophagogastroduodenoscopy  11/04/2011    Procedure: ESOPHAGOGASTRODUODENOSCOPY (EGD);  Surgeon: Arta Silence, MD;  Location: [facility];  Service: Endoscopy;  Laterality: N/A;  . Colonoscopy  11/04/2011    Procedure: COLONOSCOPY;  Surgeon: Arta Silence, MD;  Location: [facility];  Service: Endoscopy;  Laterality: N/A;  . Vasectomy  1985   Family History  Problem Relation Age of Onset  . Coronary artery disease    . Hypertension    . Heart failure Father   . Hypertension Father   . Heart disease Father   . Heart failure Mother   . Hypertension Mother   . Heart  disease Mother   . Stroke Paternal Grandmother   . Obesity Daughter    History  Substance Use Topics  . Smoking status: Never Smoker   . Smokeless tobacco: Former Systems developer  . Alcohol Use: 1.1 oz/week    1 Glasses of wine, 1 Drinks containing 0.5 oz of alcohol per week    Review of Systems  Constitutional: Negative for diaphoresis and appetite change.  HENT: Negative for mouth sores and trouble swallowing.   Eyes: Negative for visual disturbance.  Respiratory: Negative for chest tightness and wheezing.   Gastrointestinal: Positive for abdominal pain. Negative for nausea, vomiting and abdominal distention.  Endocrine: Negative for polydipsia, polyphagia and polyuria.  Genitourinary: Negative for frequency.  Musculoskeletal: Negative for gait problem.  Skin: Negative for color change, pallor and rash.  Neurological: Negative for dizziness, syncope, light-headedness and headaches.  Hematological: Does not bruise/bleed easily.  Psychiatric/Behavioral: Negative for behavioral problems and confusion.      Allergies  Review of patient's allergies indicates no known allergies.  Home Medications   Prior to Admission medications   Medication Sig Start Date End Date Taking? Authorizing Provider  aspirin EC 81 MG tablet Take 81 mg by mouth daily.    Historical Provider, MD  atorvastatin (LIPITOR) 20 MG tablet TAKE ONE TABLET BY  MOUTH ONCE DAILY 03/20/14   Lorayne Marek, MD  glimepiride (AMARYL) 4 MG tablet Take 1 tablet (4 mg total) by mouth daily before breakfast. 07/25/13   Lorayne Marek, MD  LORazepam (ATIVAN) 1 MG tablet TAKE ONE TABLET BY MOUTH AT BEDTIME AS NEEDED FOR SLEEP 03/22/14   Lorayne Marek, MD  losartan-hydrochlorothiazide (HYZAAR) 50-12.5 MG per tablet Take 1 tablet by mouth daily. 02/14/14   Lorayne Marek, MD  metFORMIN (GLUCOPHAGE) 1000 MG tablet Take 1 tablet (1,000 mg total) by mouth 2 (two) times daily with a meal. 02/14/14   Lorayne Marek, MD  ondansetron (ZOFRAN ODT) 4 MG  disintegrating tablet Take 1 tablet (4 mg total) by mouth every 8 (eight) hours as needed for nausea. 05/03/14   Tanna Furry, MD  oxyCODONE-acetaminophen (PERCOCET/ROXICET) 5-325 MG per tablet Take 2 tablets by mouth every 4 (four) hours as needed. 05/03/14   Tanna Furry, MD  propranolol (INDERAL) 10 MG tablet Take 1 tablet (10 mg total) by mouth 2 (two) times daily. 02/14/14   Lorayne Marek, MD  traZODone (DESYREL) 50 MG tablet Take 0.5 tablets (25 mg total) by mouth at bedtime as needed for sleep. 02/14/14   Lorayne Marek, MD   Triage vitals: BP 135/83  Pulse 73  Temp(Src) 100.3 F (37.9 C) (Oral)  Resp 24  Ht 6' 1.5" (1.867 m)  Wt 203 lb (92.08 kg)  BMI 26.42 kg/m2  SpO2 97%  Physical Exam  Constitutional: He is oriented to person, place, and time. He appears well-developed and well-nourished. No distress.  HENT:  Head: Normocephalic.  Eyes: Conjunctivae are normal. Pupils are equal, round, and reactive to light. No scleral icterus.  Neck: Normal range of motion. Neck supple. No thyromegaly present.  Cardiovascular: Normal rate and regular rhythm.  Exam reveals no gallop and no friction rub.   No murmur heard. Pulmonary/Chest: Effort normal and breath sounds normal. No respiratory distress. He has no wheezes. He has no rales.  Abdominal: Soft. Bowel sounds are normal. He exhibits no distension. There is tenderness in the right upper quadrant and epigastric area. There is positive Murphy's sign. There is no rebound.  Musculoskeletal: Normal range of motion.  Neurological: He is alert and oriented to person, place, and time.  Skin: Skin is warm and dry. No rash noted.  Psychiatric: He has a normal mood and affect. His behavior is normal.    ED Course  Procedures (including critical care time)  DIAGNOSTIC STUDIES: Oxygen Saturation is 97% on RA, normal by my interpretation.    COORDINATION OF CARE: 6:52 PM- Will order an abdominal ultrasound. Pt advised of plan for treatment and pt  agrees.  Medications  0.9 %  sodium chloride infusion (not administered)  ondansetron (ZOFRAN) injection 4 mg (not administered)  oxyCODONE-acetaminophen (PERCOCET/ROXICET) 5-325 MG per tablet 2 tablet (not administered)  fentaNYL (SUBLIMAZE) injection 50 mcg (0 mcg Intravenous Duplicate 30/94/07 6808)  fentaNYL (SUBLIMAZE) 0.05 MG/ML injection (50 mcg  Given 05/02/14 1833)  HYDROmorphone (DILAUDID) injection 1 mg (1 mg Intravenous Given 05/02/14 2254)  sodium chloride 0.9 % bolus 1,000 mL (1,000 mLs Intravenous New Bag/Given 05/02/14 2254)  iohexol (OMNIPAQUE) 300 MG/ML solution 50 mL (50 mLs Oral Contrast Given 05/02/14 2316)  iohexol (OMNIPAQUE) 300 MG/ML solution 100 mL (100 mLs Intravenous Contrast Given 05/02/14 2317)   Labs Review Labs Reviewed  CBC WITH DIFFERENTIAL - Abnormal; Notable for the following:    RBC 3.17 (*)    Hemoglobin 10.2 (*)    HCT 29.4 (*)  Platelets 102 (*)    Neutrophils Relative % 85 (*)    Lymphocytes Relative 10 (*)    Lymphs Abs 0.6 (*)    Monocytes Relative 2 (*)    All other components within normal limits  COMPREHENSIVE METABOLIC PANEL - Abnormal; Notable for the following:    Glucose, Bld 166 (*)    Albumin 3.2 (*)    AST 58 (*)    Total Bilirubin 2.3 (*)    GFR calc non Af Amer 62 (*)    GFR calc Af Amer 72 (*)    Anion gap 18 (*)    All other components within normal limits  LIPASE, BLOOD  URINALYSIS, ROUTINE W REFLEX MICROSCOPIC    Imaging Review US Abdomen Complete  05/02/2014   CLINICAL DATA:  Severe right upper quadrant pain.  EXAM: ULTRASOUND ABDOMEN COMPLETE  COMPARISON:  None.  FINDINGS: Gallbladder: Gallbladder not well visualized and appears collapsed. The patient has recently eaten. This makes evaluation suboptimal. Right R per quadrant pain/ Murphy's sign was present by ultrasound exam.  Common bile duct: Diameter: Difficult to visualize but appears be 1 mm diameter. No biliary distention noted.  Liver: Right lobe of the  liver appears shrunken. Hepatocellular disease/cirrhosis cannot be excluded.  IVC: No abnormality visualized.  Pancreas: Visualized portion unremarkable.  Spleen: Splenomegaly to 15 cm.  Splenic volume 985 cm cubed  Right Kidney: Length: 11.1 cm. Not well visualized. Echogenicity within normal limits. No mass or hydronephrosis visualized.  Left Kidney: Length: 11.2 cm. Echogenicity within normal limits. No mass or hydronephrosis visualized.  Abdominal aorta: No aneurysm visualized.  Other findings: Ascites. This was a technically difficult exam due to a prominent amount of overlying bowel gas.  IMPRESSION: 1.  Findings suggesting cirrhosis with splenomegaly.  2.  Ascites.  3. Gallbladder was contracted and best difficult evaluate. The patient had recently eaten.   Electronically Signed   By: Marcello Moores  Register   On: 05/02/2014 20:42   Ct Abdomen Pelvis W Contrast  05/02/2014   CLINICAL DATA:  Right-sided abdominal pain.  Initial encounter.  EXAM: CT ABDOMEN AND PELVIS WITH CONTRAST  TECHNIQUE: Multidetector CT imaging of the abdomen and pelvis was performed using the standard protocol following bolus administration of intravenous contrast.  CONTRAST:  48mL OMNIPAQUE IOHEXOL 300 MG/ML SOLN, 147mL OMNIPAQUE IOHEXOL 300 MG/ML SOLN  COMPARISON:  CT abdomen pelvis - 06/08/2011  FINDINGS: There is nodularity of the hepatic contour or compatible with cirrhosis. No discrete hepatic lesions on this non hepatic protocol CTA. Cirrhotic changes are associated with small to moderate volume intra-abdominal ascites, splenomegaly (with the spleen measuring 16.2 cm in length - image 28, series 2), gastric varices (image 19, series 2) and recanalization of the periumbilical vein.  Unchanged punctate (approximately 8 mm) stone within the neck of an otherwise normal-appearing gallbladder. No intra or extrahepatic biliary dilatation.  There is symmetric enhancement and excretion of the bilateral kidneys. No renal stones this  postcontrast examination. No discrete renal lesions. No in obstruction a perinephric stranding.  Normal appearance of the bilateral adrenal glands and pancreas.  Colonic diverticulosis without definite evidence of diverticulitis given the small to moderate volume intra-abdominal ascites. There is mild diffuse colonic wall thickening which is a nonspecific sign Ing in the setting of abdominal ascites. No evidence of obstruction. Normal appearance of the appendix. No pneumoperitoneum, pneumatosis or portal venous gas.  Scattered mixed calcified and noncalcified atherosclerotic plaque with a normal caliber abdominal aorta. The major branch vessels of the abdominal  aorta appear patent on this non CTA examination.  No bulky retroperitoneal, mesenteric, pelvic or inguinal lymphadenopathy.  Prostatic calcifications. Normal appearance of the urinary bladder given degree distention. Ascites tracks to the level of the pelvic cul-de-sac.  Limited visualization of the lower thorax demonstrates grossly unchanged punctate (approximately 6 mm) noncalcified subpleural nodule within the right middle lobe (image 7, series 4) as well as an approximately 4 mm nodule within the right lower lobe (image 3, series 4) both of which are stable since the 05/2011 examination and thus of benign etiology. Minimal bibasilar interstitial thickening. No discrete focal airspace opacities. Interval development of a trace left-sided pleural effusion.  Borderline cardiomegaly. Coronary artery calcifications. No pericardial effusion.  No acute or aggressive osseus abnormalities. Post vertebroplasty/kyphoplasty of the T12 vertebral body with associated focal kyphosis at this location.  Regional soft tissues appear normal. Small mesenteric fat containing left-sided inguinal hernia.  IMPRESSION: 1. Findings of cirrhosis and portal venous hypertension including small to moderate volume intra-abdominal ascites, splenomegaly and gastric varices. No discrete  hepatic lesions on this non hepatic protocol CT. 2. Nonspecific mild diffuse wall thickening of the colon, a nonspecific finding in the setting of intra-abdominal ascites though a concomitant enteritis is not excluded on the basis of this examination. Clinical correlation is advised. 3. No evidence of enteric or urinary obstruction. Normal appearance of the appendix. 4. Cholelithiasis without definite evidence of cholecystitis. 5. Colonic diverticulosis without evidence of diverticulitis. 6. Interval development of a trace left-sided pleural effusion.   Electronically Signed   By: Sandi Mariscal M.D.   On: 05/02/2014 23:47     EKG Interpretation None      MDM   Final diagnoses:  Abdominal pain  Biliary colic    Is limited ultrasound due to ascites. CT scan does show single stone. This is with his episodes of colicky pain. Bili is 2.5 but no other elevation of ALT alkaline phosphatase. AST minimally at 58. Normal WBC. Afebrile. Plan is home. Bland diet. Low-fat. Surgical followup.  I personally performed the services described in this documentation, which was scribed in my presence. The recorded information has been reviewed and is accurate.    Tanna Furry, MD 05/03/14 [phone]

## 2014-05-02 NOTE — ED Notes (Signed)
Pt brought in by [facility]. Pt reports RUQ pain since 430 pm. Denies n/v.  Intermittent pain. Radiation into shoulders.

## 2014-05-03 MED ORDER — ONDANSETRON HCL 4 MG/2ML IJ SOLN
4.0000 mg | Freq: Once | INTRAMUSCULAR | Status: AC
  Administered 2014-05-03: 4 mg via INTRAVENOUS
  Filled 2014-05-03: qty 2

## 2014-05-03 MED ORDER — ONDANSETRON 4 MG PO TBDP: 4.0000 mg | ORAL_TABLET | Freq: Three times a day (TID) | ORAL | Status: DC | PRN

## 2014-05-03 MED ORDER — OXYCODONE-ACETAMINOPHEN 5-325 MG PO TABS: 2.0000 | ORAL_TABLET | ORAL | Status: DC | PRN

## 2014-05-03 MED ORDER — OXYCODONE-ACETAMINOPHEN 5-325 MG PO TABS
2.0000 | ORAL_TABLET | Freq: Once | ORAL | Status: AC
  Administered 2014-05-03: 2 via ORAL
  Filled 2014-05-03: qty 2

## 2014-05-03 NOTE — Discharge Instructions (Signed)
Biliary Colic  °Biliary colic is a steady or irregular pain in the upper abdomen. It is usually under the right side of the rib cage. It happens when gallstones interfere with the normal flow of bile from the gallbladder. Bile is a liquid that helps to digest fats. Bile is made in the liver and stored in the gallbladder. When you eat a meal, bile passes from the gallbladder through the cystic duct and the common bile duct into the small intestine. There, it mixes with partially digested food. If a gallstone blocks either of these ducts, the normal flow of bile is blocked. The muscle cells in the bile duct contract forcefully to try to move the stone. This causes the pain of biliary colic.  °SYMPTOMS  °· A person with biliary colic usually complains of pain in the upper abdomen. This pain can be: °¨ In the center of the upper abdomen just below the breastbone. °¨ In the upper-right part of the abdomen, near the gallbladder and liver. °¨ Spread back toward the right shoulder blade. °· Nausea and vomiting. °· The pain usually occurs after eating. °· Biliary colic is usually triggered by the digestive system's demand for bile. The demand for bile is high after fatty meals. Symptoms can also occur when a person who has been fasting suddenly eats a very large meal. Most episodes of biliary colic pass after 1 to 5 hours. After the most intense pain passes, your abdomen may continue to ache mildly for about 24 hours. °DIAGNOSIS  °After you describe your symptoms, your caregiver will perform a physical exam. He or she will pay attention to the upper right portion of your belly (abdomen). This is the area of your liver and gallbladder. An ultrasound will help your caregiver look for gallstones. Specialized scans of the gallbladder may also be done. Blood tests may be done, especially if you have fever or if your pain persists. °PREVENTION  °Biliary colic can be prevented by controlling the risk factors for gallstones. Some of  these risk factors, such as heredity, increasing age, and pregnancy are a normal part of life. Obesity and a high-fat diet are risk factors you can change through a healthy lifestyle. Women going through menopause who take hormone replacement therapy (estrogen) are also more likely to develop biliary colic. °TREATMENT  °· Pain medication may be prescribed. °· You may be encouraged to eat a fat-free diet. °· If the first episode of biliary colic is severe, or episodes of colic keep retuning, surgery to remove the gallbladder (cholecystectomy) is usually recommended. This procedure can be done through small incisions using an instrument called a laparoscope. The procedure often requires a brief stay in the hospital. Some people can leave the hospital the same day. It is the most widely used treatment in people troubled by painful gallstones. It is effective and safe, with no complications in more than 90% of cases. °· If surgery cannot be done, medication that dissolves gallstones may be used. This medication is expensive and can take months or years to work. Only small stones will dissolve. °· Rarely, medication to dissolve gallstones is combined with a procedure called shock-wave lithotripsy. This procedure uses carefully aimed shock waves to break up gallstones. In many people treated with this procedure, gallstones form again within a few years. °PROGNOSIS  °If gallstones block your cystic duct or common bile duct, you are at risk for repeated episodes of biliary colic. There is also a 25% chance that you will develop   a gallbladder infection(acute cholecystitis), or some other complication of gallstones within 10 to 20 years. If you have surgery, schedule it at a time that is convenient for you and at a time when you are not sick. °HOME CARE INSTRUCTIONS  °· Drink plenty of clear fluids. °· Avoid fatty, greasy or fried foods, or any foods that make your pain worse. °· Take medications as directed. °SEEK MEDICAL  CARE IF:  °· You develop a fever over 100.5° F (38.1° C). °· Your pain gets worse over time. °· You develop nausea that prevents you from eating and drinking. °· You develop vomiting. °SEEK IMMEDIATE MEDICAL CARE IF:  °· You have continuous or severe belly (abdominal) pain which is not relieved with medications. °· You develop nausea and vomiting which is not relieved with medications. °· You have symptoms of biliary colic and you suddenly develop a fever and shaking chills. This may signal cholecystitis. Call your caregiver immediately. °· You develop a yellow color to your skin or the white part of your eyes (jaundice). °Document Released: 12/07/2005 Document Revised: 09/28/2011 Document Reviewed: 02/16/2008 °[employer]® Patient Information ©2015 [employer]. This information is not intended to replace advice given to you by your health care provider. Make sure you discuss any questions you have with your health care provider. ° °Abdominal Pain °Many things can cause abdominal pain. Usually, abdominal pain is not caused by a disease and will improve without treatment. It can often be observed and treated at home. Your health care provider will do a physical exam and possibly order blood tests and X-rays to help determine the seriousness of your pain. However, in many cases, more time must pass before a clear cause of the pain can be found. Before that point, your health care provider may not know if you need more testing or further treatment. °HOME CARE INSTRUCTIONS  °Monitor your abdominal pain for any changes. The following actions may help to alleviate any discomfort you are experiencing: °· Only take over-the-counter or prescription medicines as directed by your health care provider. °· Do not take laxatives unless directed to do so by your health care provider. °· Try a clear liquid diet (broth, tea, or water) as directed by your health care provider. Slowly move to a bland diet as tolerated. °SEEK MEDICAL  CARE IF: °· You have unexplained abdominal pain. °· You have abdominal pain associated with nausea or diarrhea. °· You have pain when you urinate or have a bowel movement. °· You experience abdominal pain that wakes you in the night. °· You have abdominal pain that is worsened or improved by eating food. °· You have abdominal pain that is worsened with eating fatty foods. °· You have a fever. °SEEK IMMEDIATE MEDICAL CARE IF:  °· Your pain does not go away within 2 hours. °· You keep throwing up (vomiting). °· Your pain is felt only in portions of the abdomen, such as the right side or the left lower portion of the abdomen. °· You pass bloody or black tarry stools. °MAKE SURE YOU: °· Understand these instructions.   °· Will watch your condition.   °· Will get help right away if you are not doing well or get worse.   °Document Released: 04/15/2005 Document Revised: 07/11/2013 Document Reviewed: 03/15/2013 °[employer]® Patient Information ©2015 [employer]. This information is not intended to replace advice given to you by your health care provider. Make sure you discuss any questions you have with your health care provider. ° °

## 2014-05-08 ENCOUNTER — Telehealth: Payer: Self-pay | Admitting: Internal Medicine

## 2014-05-08 NOTE — Telephone Encounter (Signed)
Patient has called in today to speak with a nurse about what he needs to do about his gall stones; patient is unable to have gallbladder surgery because it will cause complications; please f/u with patient to advise him about his pain

## 2014-06-06 ENCOUNTER — Other Ambulatory Visit: Payer: Self-pay | Admitting: Internal Medicine

## 2014-06-06 ENCOUNTER — Telehealth: Payer: Self-pay | Admitting: Emergency Medicine

## 2014-06-06 ENCOUNTER — Telehealth: Payer: Self-pay | Admitting: Internal Medicine

## 2014-06-06 NOTE — Telephone Encounter (Signed)
Pt called to schedule f/u appt with Dr. Annitta Needs s/p ER visit. Appointment scheduled for Dec.1st @ 1115 am IMPRESSION: 1. Findings of cirrhosis and portal venous hypertension including small to moderate volume intra-abdominal ascites, splenomegaly and gastric varices. No discrete hepatic lesions on this non hepatic protocol CT. 2. Nonspecific mild diffuse wall thickening of the colon, a nonspecific finding in the setting of intra-abdominal ascites though a concomitant enteritis is not excluded on the basis of this examination. Clinical correlation is advised. 3. No evidence of enteric or urinary obstruction. Normal appearance of the appendix. 4. Cholelithiasis without definite evidence of cholecystitis. 5. Colonic diverticulosis without evidence of diverticulitis. 6. Interval development of a trace left-sided pleural effusion.

## 2014-06-06 NOTE — Telephone Encounter (Signed)
Please f/u with patient about the note from 10/20;

## 2014-06-07 ENCOUNTER — Telehealth: Payer: Self-pay | Admitting: Emergency Medicine

## 2014-06-19 ENCOUNTER — Ambulatory Visit (HOSPITAL_BASED_OUTPATIENT_CLINIC_OR_DEPARTMENT_OTHER): Payer: Medicare Other

## 2014-06-19 ENCOUNTER — Ambulatory Visit: Payer: Medicare Other | Attending: Internal Medicine | Admitting: Internal Medicine

## 2014-06-19 ENCOUNTER — Encounter: Payer: Self-pay | Admitting: Internal Medicine

## 2014-06-19 VITALS — BP 120/80 | HR 69 | Temp 98.0°F | Resp 15 | Ht 73.0 in | Wt 197.6 lb

## 2014-06-19 DIAGNOSIS — E139 Other specified diabetes mellitus without complications: Secondary | ICD-10-CM

## 2014-06-19 DIAGNOSIS — K7460 Unspecified cirrhosis of liver: Secondary | ICD-10-CM | POA: Diagnosis not present

## 2014-06-19 DIAGNOSIS — E119 Type 2 diabetes mellitus without complications: Secondary | ICD-10-CM | POA: Diagnosis not present

## 2014-06-19 DIAGNOSIS — G4700 Insomnia, unspecified: Secondary | ICD-10-CM | POA: Insufficient documentation

## 2014-06-19 DIAGNOSIS — K8020 Calculus of gallbladder without cholecystitis without obstruction: Secondary | ICD-10-CM | POA: Insufficient documentation

## 2014-06-19 DIAGNOSIS — Z23 Encounter for immunization: Secondary | ICD-10-CM | POA: Diagnosis not present

## 2014-06-19 DIAGNOSIS — I10 Essential (primary) hypertension: Secondary | ICD-10-CM | POA: Diagnosis not present

## 2014-06-19 DIAGNOSIS — E785 Hyperlipidemia, unspecified: Secondary | ICD-10-CM

## 2014-06-19 DIAGNOSIS — Z7982 Long term (current) use of aspirin: Secondary | ICD-10-CM | POA: Diagnosis not present

## 2014-06-19 DIAGNOSIS — R188 Other ascites: Secondary | ICD-10-CM | POA: Insufficient documentation

## 2014-06-19 LAB — GLUCOSE, POCT (MANUAL RESULT ENTRY): POC GLUCOSE: 83 mg/dL (ref 70–99)

## 2014-06-19 LAB — POCT GLYCOSYLATED HEMOGLOBIN (HGB A1C): Hemoglobin A1C: 6

## 2014-06-19 MED ORDER — ATORVASTATIN CALCIUM 20 MG PO TABS: 20.0000 mg | ORAL_TABLET | Freq: Every day | ORAL | Status: DC

## 2014-06-19 MED ORDER — LOSARTAN POTASSIUM-HCTZ 50-12.5 MG PO TABS: 1.0000 | ORAL_TABLET | Freq: Every day | ORAL | Status: DC

## 2014-06-19 MED ORDER — PROPRANOLOL HCL 10 MG PO TABS: 10.0000 mg | ORAL_TABLET | Freq: Two times a day (BID) | ORAL | Status: DC

## 2014-06-19 MED ORDER — TRAZODONE HCL 50 MG PO TABS: 50.0000 mg | ORAL_TABLET | Freq: Every evening | ORAL | Status: DC | PRN

## 2014-06-19 MED ORDER — METFORMIN HCL 1000 MG PO TABS: 1000.0000 mg | ORAL_TABLET | Freq: Two times a day (BID) | ORAL | Status: DC

## 2014-06-19 NOTE — Progress Notes (Signed)
MRN: [id number] Name: Garrett Turner  Sex: male Age: 63 y.o. DOB: 07-Feb-1951  Allergies: Review of patient's allergies indicates no known allergies.  Chief Complaint  Patient presents with  . Follow-up    HPI: Patient is 63 y.o. male who has to of diabetes hypertension hyperlipidemia, cirrhosis, last month patient went to the emergency room with right-sided upper abdominal pain, had ultrasound and CAT scan done which reported gallstone, as per patient he has already followed up with surgeon and did not recommended the surgery at this point patient also has cirrhosis and ascites and portal hypertension and he is following up with a gastroenterologist currently taking Inderal, his diabetes is well controlled currently only taking metformin his hemoglobin A1c is 6.0%, patient also has history of insomnia and has been taking trazodone 25 mg each bedtime patient is requesting increase in the dosage. His blood pressure is well controlled.  Past Medical History  Diagnosis Date  . Diabetes mellitus   . Hypertension   . High cholesterol   . Anxiety disorder   . Pancytopenia   . Inguinal hernia     Left  . Cirrhosis   . UTI (lower urinary tract infection)   . Depression   . Anxiety   . Leg cramps     Past Surgical History  Procedure Laterality Date  . Tonsillectomy    . Esophagogastroduodenoscopy  11/04/2011    Procedure: ESOPHAGOGASTRODUODENOSCOPY (EGD);  Surgeon: Arta Silence, MD;  Location: [facility];  Service: Endoscopy;  Laterality: N/A;  . Colonoscopy  11/04/2011    Procedure: COLONOSCOPY;  Surgeon: Arta Silence, MD;  Location: [facility];  Service: Endoscopy;  Laterality: N/A;  . Vasectomy  1985      Medication List       This list is accurate as of: 06/19/14 12:07 PM.  Always use your most recent med list.               aspirin EC 81 MG tablet  Take 81 mg by mouth daily.     atorvastatin 20 MG tablet  Commonly known as:  LIPITOR  Take 1 tablet (20 mg  total) by mouth daily.     LORazepam 1 MG tablet  Commonly known as:  ATIVAN  TAKE ONE TABLET BY MOUTH AT BEDTIME AS NEEDED FOR SLEEP     losartan-hydrochlorothiazide 50-12.5 MG per tablet  Commonly known as:  HYZAAR  Take 1 tablet by mouth daily.     metFORMIN 1000 MG tablet  Commonly known as:  GLUCOPHAGE  Take 1 tablet (1,000 mg total) by mouth 2 (two) times daily with a meal.     ondansetron 4 MG disintegrating tablet  Commonly known as:  ZOFRAN ODT  Take 1 tablet (4 mg total) by mouth every 8 (eight) hours as needed for nausea.     oxyCODONE-acetaminophen 5-325 MG per tablet  Commonly known as:  PERCOCET/ROXICET  Take 2 tablets by mouth every 4 (four) hours as needed.     propranolol 10 MG tablet  Commonly known as:  INDERAL  Take 1 tablet (10 mg total) by mouth 2 (two) times daily.     traZODone 50 MG tablet  Commonly known as:  DESYREL  Take 1 tablet (50 mg total) by mouth at bedtime as needed for sleep.        Meds ordered this encounter  Medications  . metFORMIN (GLUCOPHAGE) 1000 MG tablet    Sig: Take 1 tablet (1,000 mg total) by mouth 2 (two) times  daily with a meal.    Dispense:  60 tablet    Refill:  3  . losartan-hydrochlorothiazide (HYZAAR) 50-12.5 MG per tablet    Sig: Take 1 tablet by mouth daily.    Dispense:  90 tablet    Refill:  3  . atorvastatin (LIPITOR) 20 MG tablet    Sig: Take 1 tablet (20 mg total) by mouth daily.    Dispense:  30 tablet    Refill:  3  . propranolol (INDERAL) 10 MG tablet    Sig: Take 1 tablet (10 mg total) by mouth 2 (two) times daily.    Dispense:  180 tablet    Refill:  3  . traZODone (DESYREL) 50 MG tablet    Sig: Take 1 tablet (50 mg total) by mouth at bedtime as needed for sleep.    Dispense:  30 tablet    Refill:  3    Immunization History  Administered Date(s) Administered  . Influenza Split 04/14/2012, 04/24/2013  . Influenza,inj,Quad PF,36+ Mos 06/19/2014  . Pneumococcal Polysaccharide-23 04/14/2012,  04/24/2013  . Td 04/04/2012    Family History  Problem Relation Age of Onset  . Coronary artery disease    . Hypertension    . Heart failure Father   . Hypertension Father   . Heart disease Father   . Heart failure Mother   . Hypertension Mother   . Heart disease Mother   . Stroke Paternal Grandmother   . Obesity Daughter     History  Substance Use Topics  . Smoking status: Never Smoker   . Smokeless tobacco: Former Systems developer  . Alcohol Use: 1.1 oz/week    1 Glasses of wine, 1 Not specified per week    Review of Systems   As noted in HPI  Filed Vitals:   06/19/14 1133  BP: 120/80  Pulse: 69  Temp: 98 F (36.7 C)  Resp: 15    Physical Exam  Physical Exam  Constitutional: No distress.  Eyes: EOM are normal. Pupils are equal, round, and reactive to light.  Cardiovascular: Normal rate and regular rhythm.   Pulmonary/Chest: Breath sounds normal. No respiratory distress. He has no wheezes. He has no rales.  Musculoskeletal:  1+ pedal edema    CBC    Component Value Date/Time   WBC 6.5 05/02/2014 1842   RBC 3.17* 05/02/2014 1842   RBC 3.07* 02/14/2014 1003   HGB 10.2* 05/02/2014 1842   HCT 29.4* 05/02/2014 1842   PLT 102* 05/02/2014 1842   MCV 92.7 05/02/2014 1842   LYMPHSABS 0.6* 05/02/2014 1842   MONOABS 0.2 05/02/2014 1842   EOSABS 0.2 05/02/2014 1842   BASOSABS 0.0 05/02/2014 1842    CMP     Component Value Date/Time   NA 137 05/02/2014 1842   K 4.4 05/02/2014 1842   CL 100 05/02/2014 1842   CO2 19 05/02/2014 1842   GLUCOSE 166* 05/02/2014 1842   BUN 16 05/02/2014 1842   CREATININE 1.20 05/02/2014 1842   CREATININE 1.25 02/14/2014 1003   CALCIUM 9.1 05/02/2014 1842   PROT 7.0 05/02/2014 1842   ALBUMIN 3.2* 05/02/2014 1842   AST 58* 05/02/2014 1842   ALT 27 05/02/2014 1842   ALKPHOS 107 05/02/2014 1842   BILITOT 2.3* 05/02/2014 1842   GFRNONAA 62* 05/02/2014 1842   GFRNONAA 60 02/14/2014 1003   GFRAA 72* 05/02/2014 1842   GFRAA 69  02/14/2014 1003    Lab Results  Component Value Date/Time   CHOL 229* 02/14/2014 10:03  AM    No components found for: HGA1C  Lab Results  Component Value Date/Time   AST 58* 05/02/2014 06:42 PM    Assessment and Plan  Other specified diabetes mellitus without complications - Plan:  Results for orders placed or performed in visit on 06/19/14  Glucose (CBG)  Result Value Ref Range   POC Glucose 83 70 - 99 mg/dl  HgB A1c  Result Value Ref Range   Hemoglobin A1C 6.0    Her diabetes is well controlled, continue with, metFORMIN (GLUCOPHAGE) 1000 MG tablet  Cirrhosis of liver with ascites, unspecified hepatic cirrhosis type - Plan: propranolol (INDERAL) 10 MG tablet, patient to follow with the gastroenterologist  Hyperlipidemia - Plan: currently onatorvastatin (LIPITOR) 20 MG tablet,we'll check Lipid panel  Insomnia - Plan: advised for lifestyle modification, increased the dose of trazodone 50 mg each bedtime traZODone (DESYREL) 50 MG tablet  Gallstone Patient is following up with the general surgeon.  Essential hypertension - Plan:blood pressure is well controlled continue with losartan-hydrochlorothiazide (HYZAAR) 50-12.5 MG per tablet  Needs flu shot Flu shot given today.  Health Maintenance  -Vaccinations:  Flu shot today   Return in about 3 months (around 09/18/2014) for hypertension, diabetes, hyperipidemia.  Lorayne Marek, MD

## 2014-06-19 NOTE — Progress Notes (Signed)
Patient is here following up from ER visit due to cirrhosis.   He denies pain and denies any other concerns.

## 2014-06-20 LAB — LIPID PANEL
Cholesterol: 189 mg/dL (ref 0–200)
HDL: 29 mg/dL — AB (ref >39)
LDL CALC: 137 mg/dL — AB (ref 0–99)
Total CHOL/HDL Ratio: 6.5 Ratio
Triglycerides: 117 mg/dL (ref <150)
VLDL: 23 mg/dL (ref 0–40)

## 2014-06-22 ENCOUNTER — Telehealth: Payer: Self-pay

## 2014-06-22 NOTE — Telephone Encounter (Signed)
Patient is aware of his lab results 

## 2014-06-22 NOTE — Telephone Encounter (Signed)
-----   Message from Lorayne Marek, MD sent at 06/20/2014 12:47 PM EST ----- Call and let the patient know that his cholesterol level is improved, continue with low-fat diet and Lipitor as prescribed.

## 2014-07-26 ENCOUNTER — Telehealth: Payer: Self-pay | Admitting: Internal Medicine

## 2014-07-26 ENCOUNTER — Ambulatory Visit: Payer: Medicare Other | Attending: Internal Medicine | Admitting: Internal Medicine

## 2014-07-26 VITALS — BP 132/78 | HR 98 | Temp 98.4°F | Resp 18

## 2014-07-26 DIAGNOSIS — E1165 Type 2 diabetes mellitus with hyperglycemia: Secondary | ICD-10-CM | POA: Diagnosis not present

## 2014-07-26 DIAGNOSIS — R05 Cough: Secondary | ICD-10-CM | POA: Diagnosis present

## 2014-07-26 LAB — GLUCOSE, POCT (MANUAL RESULT ENTRY): POC Glucose: 149 mg/dl — AB (ref 70–99)

## 2014-07-26 MED ORDER — AMOXICILLIN-POT CLAVULANATE 875-125 MG PO TABS: 1.0000 | ORAL_TABLET | Freq: Two times a day (BID) | ORAL | Status: DC

## 2014-07-26 NOTE — Progress Notes (Signed)
Patient walked in c/o 3 day hx cough productive of green sputum Also c/o chills, aches, sore throat, ear pain, nasal congestion, and yellow-blood tinged nasal drainage Patient denies fever  Patient states never smoker Patient states he took nyquil with some relief but stopped due to instructions not take with liver problems and patient has hx of cirrhosis  BP 132/78 P 98 T  98.4 oral R 18 SPO2 95%  No tenderness noted upon palpation of maxillary or frontal sinuses No cervical lymphadenopathy noted Ears: canals clear without redness or drainage; tympanic membranes intact without redness or bulging Throat:  redness and clear drainage Lungs: clear to auscultation with good breath sounds noted  Per Medical Director: Augmentin 875 mg bid x 10 days  Patient encouraged to rest, drink plenty of fluids (water) Call back if sx worsen or fail to improve   Agree with RN's assessment and intervention  Lorayne Marek, MD

## 2014-07-26 NOTE — Patient Instructions (Signed)
Cough, Adult  A cough is a reflex that helps clear your throat and airways. It can help heal the body or may be a reaction to an irritated airway. A cough may only last 2 or 3 weeks (acute) or may last more than 8 weeks (chronic).  CAUSES Acute cough:  Viral or bacterial infections. Chronic cough:  Infections.  Allergies.  Asthma.  Post-nasal drip.  Smoking.  Heartburn or acid reflux.  Some medicines.  Chronic lung problems (COPD).  Cancer. SYMPTOMS   Cough.  Fever.  Chest pain.  Increased breathing rate.  High-pitched whistling sound when breathing (wheezing).  Colored mucus that you cough up (sputum). TREATMENT   A bacterial cough may be treated with antibiotic medicine.  A viral cough must run its course and will not respond to antibiotics.  Your caregiver may recommend other treatments if you have a chronic cough. HOME CARE INSTRUCTIONS   Only take over-the-counter or prescription medicines for pain, discomfort, or fever as directed by your caregiver. Use cough suppressants only as directed by your caregiver.  Use a cold steam vaporizer or humidifier in your bedroom or home to help loosen secretions.  Sleep in a semi-upright position if your cough is worse at night.  Rest as needed.  Stop smoking if you smoke. SEEK IMMEDIATE MEDICAL CARE IF:   You have pus in your sputum.  Your cough starts to worsen.  You cannot control your cough with suppressants and are losing sleep.  You begin coughing up blood.  You have difficulty breathing.  You develop pain which is getting worse or is uncontrolled with medicine.  You have a fever. MAKE SURE YOU:   Understand these instructions.  Will watch your condition.  Will get help right away if you are not doing well or get worse. Document Released: 01/02/2011 Document Revised: 09/28/2011 Document Reviewed: 01/02/2011 Exit[employer] Patient Information 2015 [employer]. This information is not intended  to replace advice given to you by your health care provider. Make sure you discuss any questions you have with your health care provider.  

## 2014-07-26 NOTE — Telephone Encounter (Signed)
Patient called stating that he was very sick and has a very bad cold, patient was advised of our walk-in hours for patient to be able to see triage nurse for treatment. Patient stated that he would try to come in in the afternoon.

## 2014-08-14 ENCOUNTER — Emergency Department (HOSPITAL_COMMUNITY): Payer: Medicare Other

## 2014-08-14 ENCOUNTER — Inpatient Hospital Stay (HOSPITAL_COMMUNITY)
Admission: EM | Admit: 2014-08-14 | Discharge: 2014-08-24 | DRG: 987 | Disposition: A | Discharge status: Skilled Nursing Facility | Payer: Medicare Other | Attending: Internal Medicine | Admitting: Internal Medicine

## 2014-08-14 ENCOUNTER — Encounter (HOSPITAL_COMMUNITY): Payer: Self-pay | Admitting: Emergency Medicine

## 2014-08-14 DIAGNOSIS — I10 Essential (primary) hypertension: Secondary | ICD-10-CM | POA: Diagnosis present

## 2014-08-14 DIAGNOSIS — J189 Pneumonia, unspecified organism: Secondary | ICD-10-CM | POA: Diagnosis not present

## 2014-08-14 DIAGNOSIS — K7581 Nonalcoholic steatohepatitis (NASH): Secondary | ICD-10-CM | POA: Diagnosis present

## 2014-08-14 DIAGNOSIS — E440 Moderate protein-calorie malnutrition: Secondary | ICD-10-CM | POA: Diagnosis present

## 2014-08-14 DIAGNOSIS — J810 Acute pulmonary edema: Secondary | ICD-10-CM | POA: Diagnosis not present

## 2014-08-14 DIAGNOSIS — R21 Rash and other nonspecific skin eruption: Secondary | ICD-10-CM | POA: Diagnosis present

## 2014-08-14 DIAGNOSIS — G459 Transient cerebral ischemic attack, unspecified: Secondary | ICD-10-CM | POA: Diagnosis present

## 2014-08-14 DIAGNOSIS — F4323 Adjustment disorder with mixed anxiety and depressed mood: Secondary | ICD-10-CM | POA: Diagnosis present

## 2014-08-14 DIAGNOSIS — R0602 Shortness of breath: Secondary | ICD-10-CM | POA: Diagnosis not present

## 2014-08-14 DIAGNOSIS — G4700 Insomnia, unspecified: Secondary | ICD-10-CM | POA: Diagnosis present

## 2014-08-14 DIAGNOSIS — K5900 Constipation, unspecified: Secondary | ICD-10-CM | POA: Diagnosis not present

## 2014-08-14 DIAGNOSIS — D638 Anemia in other chronic diseases classified elsewhere: Secondary | ICD-10-CM | POA: Diagnosis present

## 2014-08-14 DIAGNOSIS — R188 Other ascites: Secondary | ICD-10-CM | POA: Diagnosis present

## 2014-08-14 DIAGNOSIS — D696 Thrombocytopenia, unspecified: Secondary | ICD-10-CM | POA: Diagnosis present

## 2014-08-14 DIAGNOSIS — J90 Pleural effusion, not elsewhere classified: Secondary | ICD-10-CM | POA: Diagnosis not present

## 2014-08-14 DIAGNOSIS — R531 Weakness: Secondary | ICD-10-CM | POA: Diagnosis present

## 2014-08-14 DIAGNOSIS — Z823 Family history of stroke: Secondary | ICD-10-CM

## 2014-08-14 DIAGNOSIS — J811 Chronic pulmonary edema: Secondary | ICD-10-CM

## 2014-08-14 DIAGNOSIS — Z6824 Body mass index (BMI) 24.0-24.9, adult: Secondary | ICD-10-CM

## 2014-08-14 DIAGNOSIS — R29898 Other symptoms and signs involving the musculoskeletal system: Secondary | ICD-10-CM

## 2014-08-14 DIAGNOSIS — K7460 Unspecified cirrhosis of liver: Secondary | ICD-10-CM | POA: Diagnosis present

## 2014-08-14 DIAGNOSIS — J323 Chronic sphenoidal sinusitis: Secondary | ICD-10-CM | POA: Diagnosis present

## 2014-08-14 DIAGNOSIS — I639 Cerebral infarction, unspecified: Secondary | ICD-10-CM

## 2014-08-14 DIAGNOSIS — E785 Hyperlipidemia, unspecified: Secondary | ICD-10-CM | POA: Diagnosis present

## 2014-08-14 DIAGNOSIS — J329 Chronic sinusitis, unspecified: Secondary | ICD-10-CM | POA: Diagnosis present

## 2014-08-14 LAB — BASIC METABOLIC PANEL
Anion gap: 7 (ref 5–15)
BUN: 15 mg/dL (ref 6–23)
CO2: 21 mmol/L (ref 19–32)
Calcium: 8.3 mg/dL — ABNORMAL LOW (ref 8.4–10.5)
Chloride: 106 mmol/L (ref 96–112)
Creatinine, Ser: 0.88 mg/dL (ref 0.50–1.35)
GFR calc Af Amer: >90 mL/min (ref >90)
GFR calc non Af Amer: 88 mL/min — ABNORMAL LOW (ref >90)
Glucose, Bld: 105 mg/dL — ABNORMAL HIGH (ref 70–99)
Potassium: 3.5 mmol/L (ref 3.5–5.1)
Sodium: 134 mmol/L — ABNORMAL LOW (ref 135–145)

## 2014-08-14 LAB — I-STAT CHEM 8, ED
BUN: 14 mg/dL (ref 6–23)
Calcium, Ion: 1.13 mmol/L (ref 1.13–1.30)
Chloride: 105 mmol/L (ref 96–112)
Creatinine, Ser: 0.9 mg/dL (ref 0.50–1.35)
GLUCOSE: 98 mg/dL (ref 70–99)
HEMATOCRIT: 32 % — AB (ref 39.0–52.0)
HEMOGLOBIN: 10.9 g/dL — AB (ref 13.0–17.0)
POTASSIUM: 3.8 mmol/L (ref 3.5–5.1)
Sodium: 137 mmol/L (ref 135–145)
TCO2: 17 mmol/L (ref 0–100)

## 2014-08-14 LAB — HEPATIC FUNCTION PANEL
ALT: 34 U/L (ref 0–53)
AST: 75 U/L — ABNORMAL HIGH (ref 0–37)
Albumin: 2.9 g/dL — ABNORMAL LOW (ref 3.5–5.2)
Alkaline Phosphatase: 121 U/L — ABNORMAL HIGH (ref 39–117)
BILIRUBIN DIRECT: 1 mg/dL — AB (ref 0.0–0.5)
BILIRUBIN INDIRECT: 2.4 mg/dL — AB (ref 0.3–0.9)
TOTAL PROTEIN: 7.1 g/dL (ref 6.0–8.3)
Total Bilirubin: 3.4 mg/dL — ABNORMAL HIGH (ref 0.3–1.2)

## 2014-08-14 LAB — DIFFERENTIAL
BASOS PCT: 1 % (ref 0–1)
Basophils Absolute: 0 10*3/uL (ref 0.0–0.1)
EOS ABS: 0.3 10*3/uL (ref 0.0–0.7)
Eosinophils Relative: 8 % — ABNORMAL HIGH (ref 0–5)
LYMPHS PCT: 19 % (ref 12–46)
Lymphs Abs: 0.7 10*3/uL (ref 0.7–4.0)
MONOS PCT: 14 % — AB (ref 3–12)
Monocytes Absolute: 0.5 10*3/uL (ref 0.1–1.0)
NEUTROS ABS: 2.2 10*3/uL (ref 1.7–7.7)
Neutrophils Relative %: 58 % (ref 43–77)

## 2014-08-14 LAB — PROTIME-INR
INR: 1.63 — ABNORMAL HIGH (ref 0.00–1.49)
PROTHROMBIN TIME: 19.5 s — AB (ref 11.6–15.2)

## 2014-08-14 LAB — BRAIN NATRIURETIC PEPTIDE: B Natriuretic Peptide: 121.5 pg/mL — ABNORMAL HIGH (ref 0.0–100.0)

## 2014-08-14 LAB — CBC
HCT: 31.7 % — ABNORMAL LOW (ref 39.0–52.0)
HCT: 29.8 % — ABNORMAL LOW (ref 39.0–52.0)
Hemoglobin: 10.5 g/dL — ABNORMAL LOW (ref 13.0–17.0)
Hemoglobin: 10.2 g/dL — ABNORMAL LOW (ref 13.0–17.0)
MCH: 30.7 pg (ref 26.0–34.0)
MCH: 31.5 pg (ref 26.0–34.0)
MCHC: 33.1 g/dL (ref 30.0–36.0)
MCHC: 34.2 g/dL (ref 30.0–36.0)
MCV: 92.7 fL (ref 78.0–100.0)
MCV: 92 fL (ref 78.0–100.0)
Platelets: 75 10*3/uL — ABNORMAL LOW (ref 150–400)
Platelets: 48 10*3/uL — ABNORMAL LOW (ref 150–400)
RBC: 3.42 MIL/uL — AB (ref 4.22–5.81)
RBC: 3.24 MIL/uL — ABNORMAL LOW (ref 4.22–5.81)
RDW: 17.3 % — AB (ref 11.5–15.5)
RDW: 17.2 % — ABNORMAL HIGH (ref 11.5–15.5)
WBC: 4.8 10*3/uL (ref 4.0–10.5)
WBC: 3.7 10*3/uL — ABNORMAL LOW (ref 4.0–10.5)

## 2014-08-14 LAB — I-STAT TROPONIN, ED
TROPONIN I, POC: 0.01 ng/mL (ref 0.00–0.08)
Troponin i, poc: 0 ng/mL (ref 0.00–0.08)

## 2014-08-14 LAB — ETHANOL: Alcohol, Ethyl (B): <5 mg/dL (ref 0–9)

## 2014-08-14 LAB — APTT: APTT: 40 s — AB (ref 24–37)

## 2014-08-14 MED ORDER — IOHEXOL 350 MG/ML SOLN
100.0000 mL | Freq: Once | INTRAVENOUS | Status: AC | PRN
  Administered 2014-08-14: 100 mL via INTRAVENOUS

## 2014-08-14 MED ORDER — FUROSEMIDE 10 MG/ML IJ SOLN
60.0000 mg | Freq: Once | INTRAMUSCULAR | Status: AC
Start: 2014-08-14 — End: 2014-08-14
  Administered 2014-08-14: 60 mg via INTRAVENOUS
  Filled 2014-08-14: qty 8

## 2014-08-14 NOTE — ED Provider Notes (Addendum)
CSN: [id number]     Arrival date & time 08/14/14  1655 History   First MD Initiated Contact with Patient 08/14/14 2041     Chief Complaint  Patient presents with  . Shortness of Breath    Patient is a 64 y.o. male presenting with shortness of breath. The history is provided by the patient.  Shortness of Breath Severity:  Severe Onset quality:  Gradual Duration:  2 weeks Timing:  Constant Progression:  Worsening Chronicity:  New Context comment:  It all started after an upper respiratory infection. Relieved by:  Nothing Worsened by:  Activity He saw his PCP and was started on abx.  He finished them about a week ago.  Following the abx he started developing diarrhea.  He went to see his GI doctor today who was worried about his shortness of breath and sent him to the the hospital.    Past Medical History  Diagnosis Date  . Diabetes mellitus   . Hypertension   . High cholesterol   . Anxiety disorder   . Pancytopenia   . Inguinal hernia     Left  . Cirrhosis   . UTI (lower urinary tract infection)   . Depression   . Anxiety   . Leg cramps    Past Surgical History  Procedure Laterality Date  . Tonsillectomy    . Esophagogastroduodenoscopy  11/04/2011    Procedure: ESOPHAGOGASTRODUODENOSCOPY (EGD);  Surgeon: Arta Silence, MD;  Location: [facility];  Service: Endoscopy;  Laterality: N/A;  . Colonoscopy  11/04/2011    Procedure: COLONOSCOPY;  Surgeon: Arta Silence, MD;  Location: [facility];  Service: Endoscopy;  Laterality: N/A;  . Vasectomy  1985   Family History  Problem Relation Age of Onset  . Coronary artery disease    . Hypertension    . Heart failure Father   . Hypertension Father   . Heart disease Father   . Heart failure Mother   . Hypertension Mother   . Heart disease Mother   . Stroke Paternal Grandmother   . Obesity Daughter    History  Substance Use Topics  . Smoking status: Never Smoker   . Smokeless tobacco: Former Systems developer  . Alcohol Use: 1.1  oz/week    1 Glasses of wine, 1 Not specified per week    Review of Systems  Respiratory: Positive for shortness of breath.   All other systems reviewed and are negative.     Allergies  Review of patient's allergies indicates no known allergies.  Home Medications   Prior to Admission medications   Medication Sig Start Date End Date Taking? Authorizing Provider  atorvastatin (LIPITOR) 20 MG tablet Take 1 tablet (20 mg total) by mouth daily. 06/19/14  Yes Lorayne Marek, MD  losartan-hydrochlorothiazide (HYZAAR) 50-12.5 MG per tablet Take 1 tablet by mouth daily. 06/19/14  Yes Lorayne Marek, MD  metFORMIN (GLUCOPHAGE) 1000 MG tablet Take 1 tablet (1,000 mg total) by mouth 2 (two) times daily with a meal. 06/19/14  Yes Deepak Advani, MD  propranolol (INDERAL) 10 MG tablet Take 1 tablet (10 mg total) by mouth 2 (two) times daily. 06/19/14  Yes Deepak Advani, MD  Pseudoeph-Doxylamine-DM-APAP (NYQUIL PO) Take 30 mLs by mouth every 6 (six) hours as needed (cold symptoms).   Yes Historical Provider, MD  Pseudoephedrine-APAP-DM (DAYQUIL PO) Take 30 mLs by mouth every 6 (six) hours as needed (cold symptoms).   Yes Historical Provider, MD  traZODone (DESYREL) 50 MG tablet Take 1 tablet (50 mg total) by  mouth at bedtime as needed for sleep. 06/19/14  Yes Lorayne Marek, MD  amoxicillin-clavulanate (AUGMENTIN) 875-125 MG per tablet Take 1 tablet by mouth 2 (two) times daily. Patient not taking: Reported on 08/14/2014 07/26/14   Tresa Garter, MD  ondansetron (ZOFRAN ODT) 4 MG disintegrating tablet Take 1 tablet (4 mg total) by mouth every 8 (eight) hours as needed for nausea. 05/03/14   Tanna Furry, MD  oxyCODONE-acetaminophen (PERCOCET/ROXICET) 5-325 MG per tablet Take 2 tablets by mouth every 4 (four) hours as needed. 05/03/14   Tanna Furry, MD   BP 115/69 mmHg  Pulse 67  Temp(Src) 96.7 F (35.9 C) (Rectal)  Resp 18  SpO2 99% Physical Exam  Constitutional: He appears well-developed and  well-nourished. No distress.  HENT:  Head: Normocephalic and atraumatic.  Right Ear: External ear normal.  Left Ear: External ear normal.  Eyes: Conjunctivae are normal. Right eye exhibits no discharge. Left eye exhibits no discharge. No scleral icterus.  Neck: Neck supple. No tracheal deviation present.  Cardiovascular: Normal rate, regular rhythm and intact distal pulses.   Pulmonary/Chest: Effort normal. No stridor. No respiratory distress. He has no wheezes. He has rales in the right upper field, the right lower field, the left upper field, the left middle field and the left lower field.  Abdominal: Soft. Bowel sounds are normal. He exhibits no distension. There is no tenderness. There is no rebound and no guarding.  Musculoskeletal: He exhibits edema. He exhibits no tenderness.  Neurological: He is alert. He has normal strength. No cranial nerve deficit (no facial droop, extraocular movements intact, no slurred speech) or sensory deficit. He exhibits normal muscle tone. He displays no seizure activity. Coordination normal.  Skin: Skin is warm and dry. No rash noted.  Psychiatric: He has a normal mood and affect.  Nursing note and vitals reviewed.   ED Course  Procedures (including critical care time) CRITICAL CARE Performed by: IHKVQ,QVZ Total critical care time: 35 Critical care time was exclusive of separately billable procedures and treating other patients. Critical care was necessary to treat or prevent imminent or life-threatening deterioration. Critical care was time spent personally by me on the following activities: development of treatment plan with patient and/or surrogate as well as nursing, discussions with consultants, evaluation of patient's response to treatment, examination of patient, obtaining history from patient or surrogate, ordering and performing treatments and interventions, ordering and review of laboratory studies, ordering and review of radiographic studies,  pulse oximetry and re-evaluation of patient's condition.  Labs Review Labs Reviewed  CBC - Abnormal; Notable for the following:    RBC 3.42 (*)    Hemoglobin 10.5 (*)    HCT 31.7 (*)    RDW 17.3 (*)    Platelets 75 (*)    All other components within normal limits  BASIC METABOLIC PANEL - Abnormal; Notable for the following:    Sodium 134 (*)    Glucose, Bld 105 (*)    Calcium 8.3 (*)    GFR calc non Af Amer 88 (*)    All other components within normal limits  BRAIN NATRIURETIC PEPTIDE - Abnormal; Notable for the following:    B Natriuretic Peptide 121.5 (*)    All other components within normal limits  PROTIME-INR - Abnormal; Notable for the following:    Prothrombin Time 19.5 (*)    INR 1.63 (*)    All other components within normal limits  HEPATIC FUNCTION PANEL - Abnormal; Notable for the following:    Albumin  2.9 (*)    AST 75 (*)    Alkaline Phosphatase 121 (*)    Total Bilirubin 3.4 (*)    Bilirubin, Direct 1.0 (*)    Indirect Bilirubin 2.4 (*)    All other components within normal limits  APTT - Abnormal; Notable for the following:    aPTT 40 (*)    All other components within normal limits  CBC - Abnormal; Notable for the following:    WBC 3.7 (*)    RBC 3.24 (*)    Hemoglobin 10.2 (*)    HCT 29.8 (*)    RDW 17.2 (*)    Platelets 48 (*)    All other components within normal limits  DIFFERENTIAL - Abnormal; Notable for the following:    Monocytes Relative 14 (*)    Eosinophils Relative 8 (*)    All other components within normal limits  I-STAT CHEM 8, ED - Abnormal; Notable for the following:    Hemoglobin 10.9 (*)    HCT 32.0 (*)    All other components within normal limits  ETHANOL  URINE RAPID DRUG SCREEN (HOSP PERFORMED)  URINALYSIS, ROUTINE W REFLEX MICROSCOPIC  I-STAT TROPOININ, ED  I-STAT TROPOININ, ED  I-STAT TROPOININ, ED    Imaging Review Ct Head Wo Contrast  08/14/2014   CLINICAL DATA:  Cold stroke, syncope, new onset right-sided weakness   EXAM: CT HEAD WITHOUT CONTRAST  TECHNIQUE: Contiguous axial images were obtained from the base of the skull through the vertex without intravenous contrast.  COMPARISON:  06/18/2011  FINDINGS: No skull fracture is noted. No intracranial hemorrhage, mass effect or midline shift. No definite acute cortical infarction. No mass lesion is noted on this unenhanced scan. There is residual vascular enhancement from CT scan of the chest same day.  IMPRESSION: No intracranial hemorrhage, mass effect or midline shift. No definite acute cortical infarct. These results were called by telephone at the time of interpretation on 08/14/2014 at 10:09 pm to Dr. Dorie Rank , who verbally acknowledged these results.   Electronically Signed   By: Lahoma Crocker M.D.   On: 08/14/2014 22:09   Ct Angio Chest Pe W/cm &/or Wo Cm  08/14/2014   CLINICAL DATA:  Severe dyspnea with fatigue.  EXAM: CT ANGIOGRAPHY CHEST WITH CONTRAST  TECHNIQUE: Multidetector CT imaging of the chest was performed using the standard protocol during bolus administration of intravenous contrast. Multiplanar CT image reconstructions and MIPs were obtained to evaluate the vascular anatomy.  CONTRAST:  170mL OMNIPAQUE IOHEXOL 350 MG/ML SOLN  COMPARISON:  None.  FINDINGS: Cardiovascular: There is good opacification of the pulmonary arteries with no evidence of pulmonary embolism. The thoracic aorta is normal in caliber and intact.  Lungs: There is extensive interstitial thickening. This probably represents interstitial edema but a component of interstitial fibrosis cannot be excluded. There is a 13 mm spiculated opacity in the right upper lobe laterally and there are a few additional scattered areas of ground-glass opacity in the periphery of both lungs. These could represent more confluent areas of interstitial abnormality but follow-up is necessary to exclude persistence of nodules after resolution of the interstitial fluid.  Central airways: Patent  Effusions: Moderate  left pleural effusion. No right pleural effusion.  Lymphadenopathy: There are a few scattered benign appearing axillary nodes. No hilar or mediastinal adenopathy is evident.  Esophagus: Unremarkable  Upper abdomen: Marked cirrhotic appearing irregularities of the liver. Splenic enlargement. Probable large varices around the distal esophagus and EG junction. Large volume ascites.  Musculoskeletal: No acute findings  Review of the MIP images confirms the above findings.  IMPRESSION: *Negative for pulmonary embolism. *Extensive interstitial thickening, likely pulmonary edema *Can not exclude a 13 mm right upper lobe nodule although it may merely represent more confluent interstitial abnormality. Follow-up CT recommended after resolution of the interstitial infiltrates/edema. *Moderately large left pleural effusion *Cirrhosis with portal hypertension and large volume peritoneal ascites   Electronically Signed   By: Andreas Newport M.D.   On: 08/14/2014 22:00   Dg Chest Port 1 View  08/14/2014   CLINICAL DATA:  Shortness of Breath, history of cirrhosis  EXAM: PORTABLE CHEST - 1 VIEW  COMPARISON:  10/17/2013  FINDINGS: Cardiomediastinal silhouette is stable. Mild interstitial prominence bilaterally without pulmonary edema. No segmental infiltrate.  IMPRESSION: Mild interstitial prominence bilaterally without convincing pulmonary edema. No segmental infiltrate.   Electronically Signed   By: Lahoma Crocker M.D.   On: 08/14/2014 17:54     EKG Interpretation   Date/Time:  Tuesday August 14 2014 17:24:17 EST Ventricular Rate:  62 PR Interval:  132 QRS Duration: 103 QT Interval:  477 QTC Calculation: 484 R Axis:   50 Text Interpretation:  Sinus rhythm Abnormal lateral Q waves Since previous  tracing Q waves are more prominent Confirmed by Canary Brim  MD, MARTHA [phone])  on 08/14/2014 8:18:45 PM     Medications  furosemide (LASIX) injection 60 mg (not administered)  iohexol (OMNIPAQUE) 350 MG/ML injection 100 mL  (100 mLs Intravenous Contrast Given 08/14/14 2131)    MDM   Final diagnoses:  Shortness of breath  Cirrhosis of liver with ascites, unspecified hepatic cirrhosis type  Stroke  Acute pulmonary edema    Pt presented with shortness of breath, worsening leg edema.  Initial CXR unremarkable however CT scan is more suggestive of pulmonary edema.  No PE noted.  Shortly after getting his CT scan pt became acutely confused with slurred speech and right sided weakness.  Concerning for acute stroke.  Code stroke activated.  Discussed with Dr Janann Colonel.  Pt is thrombocytopenic however, not a TPA candidate for that reason.  Plan on admission to [facility] for further stroke evaluation and treatment of his pulmonary edema.  Pt re-examined at 1145 and his speech is back to his baseline when he first arrived.  Able to lift both right arm and leg off the bed now.   Dorie Rank, MD 08/14/14 3664  Dorie Rank, MD 08/15/14 0001

## 2014-08-14 NOTE — ED Notes (Signed)
Patient returned from CT angio and had mental status changes and weakness on R side after having scan done per CT tech. Pt was altered from baseline when he returned. Code stroke activated by Dr. Tomi Bamberger who was present at bedside.

## 2014-08-14 NOTE — ED Notes (Signed)
Pt with Hx of liver cirrhosis reports to ED for SOB, pt poorly tolerates small amounts of activity, Pt's PCP states pt appears to have fluid volume overload.

## 2014-08-15 ENCOUNTER — Inpatient Hospital Stay (HOSPITAL_COMMUNITY): Payer: Medicare Other

## 2014-08-15 ENCOUNTER — Encounter (HOSPITAL_COMMUNITY): Payer: Self-pay | Admitting: *Deleted

## 2014-08-15 DIAGNOSIS — Z6824 Body mass index (BMI) 24.0-24.9, adult: Secondary | ICD-10-CM | POA: Diagnosis not present

## 2014-08-15 DIAGNOSIS — K7581 Nonalcoholic steatohepatitis (NASH): Secondary | ICD-10-CM | POA: Diagnosis present

## 2014-08-15 DIAGNOSIS — G459 Transient cerebral ischemic attack, unspecified: Secondary | ICD-10-CM | POA: Diagnosis present

## 2014-08-15 DIAGNOSIS — K5900 Constipation, unspecified: Secondary | ICD-10-CM | POA: Diagnosis not present

## 2014-08-15 DIAGNOSIS — K219 Gastro-esophageal reflux disease without esophagitis: Secondary | ICD-10-CM | POA: Diagnosis not present

## 2014-08-15 DIAGNOSIS — R21 Rash and other nonspecific skin eruption: Secondary | ICD-10-CM | POA: Diagnosis present

## 2014-08-15 DIAGNOSIS — I639 Cerebral infarction, unspecified: Secondary | ICD-10-CM

## 2014-08-15 DIAGNOSIS — J189 Pneumonia, unspecified organism: Secondary | ICD-10-CM | POA: Diagnosis not present

## 2014-08-15 DIAGNOSIS — Z823 Family history of stroke: Secondary | ICD-10-CM | POA: Diagnosis not present

## 2014-08-15 DIAGNOSIS — J90 Pleural effusion, not elsewhere classified: Secondary | ICD-10-CM | POA: Diagnosis not present

## 2014-08-15 DIAGNOSIS — F4323 Adjustment disorder with mixed anxiety and depressed mood: Secondary | ICD-10-CM | POA: Diagnosis present

## 2014-08-15 DIAGNOSIS — I6789 Other cerebrovascular disease: Secondary | ICD-10-CM

## 2014-08-15 DIAGNOSIS — R531 Weakness: Secondary | ICD-10-CM | POA: Diagnosis present

## 2014-08-15 DIAGNOSIS — K7460 Unspecified cirrhosis of liver: Secondary | ICD-10-CM | POA: Diagnosis not present

## 2014-08-15 DIAGNOSIS — R0602 Shortness of breath: Secondary | ICD-10-CM | POA: Diagnosis present

## 2014-08-15 DIAGNOSIS — G4700 Insomnia, unspecified: Secondary | ICD-10-CM | POA: Diagnosis present

## 2014-08-15 DIAGNOSIS — R188 Other ascites: Secondary | ICD-10-CM | POA: Diagnosis present

## 2014-08-15 DIAGNOSIS — I10 Essential (primary) hypertension: Secondary | ICD-10-CM | POA: Diagnosis present

## 2014-08-15 DIAGNOSIS — J323 Chronic sphenoidal sinusitis: Secondary | ICD-10-CM | POA: Diagnosis present

## 2014-08-15 DIAGNOSIS — D696 Thrombocytopenia, unspecified: Secondary | ICD-10-CM | POA: Diagnosis present

## 2014-08-15 DIAGNOSIS — J948 Other specified pleural conditions: Secondary | ICD-10-CM | POA: Diagnosis not present

## 2014-08-15 DIAGNOSIS — K7469 Other cirrhosis of liver: Secondary | ICD-10-CM | POA: Diagnosis not present

## 2014-08-15 DIAGNOSIS — J810 Acute pulmonary edema: Secondary | ICD-10-CM | POA: Diagnosis present

## 2014-08-15 DIAGNOSIS — E440 Moderate protein-calorie malnutrition: Secondary | ICD-10-CM | POA: Diagnosis present

## 2014-08-15 DIAGNOSIS — D638 Anemia in other chronic diseases classified elsewhere: Secondary | ICD-10-CM | POA: Diagnosis present

## 2014-08-15 DIAGNOSIS — A419 Sepsis, unspecified organism: Secondary | ICD-10-CM | POA: Diagnosis not present

## 2014-08-15 DIAGNOSIS — E785 Hyperlipidemia, unspecified: Secondary | ICD-10-CM | POA: Diagnosis present

## 2014-08-15 DIAGNOSIS — R609 Edema, unspecified: Secondary | ICD-10-CM

## 2014-08-15 DIAGNOSIS — M6289 Other specified disorders of muscle: Secondary | ICD-10-CM

## 2014-08-15 LAB — RAPID URINE DRUG SCREEN, HOSP PERFORMED
AMPHETAMINES: NOT DETECTED
AMPHETAMINES: NOT DETECTED
BARBITURATES: NOT DETECTED
BENZODIAZEPINES: NOT DETECTED
Barbiturates: NOT DETECTED
Benzodiazepines: NOT DETECTED
COCAINE: NOT DETECTED
Cocaine: NOT DETECTED
OPIATES: NOT DETECTED
Opiates: NOT DETECTED
Tetrahydrocannabinol: NOT DETECTED
Tetrahydrocannabinol: NOT DETECTED

## 2014-08-15 LAB — GLUCOSE, CAPILLARY
GLUCOSE-CAPILLARY: 125 mg/dL — AB (ref 70–99)
GLUCOSE-CAPILLARY: 143 mg/dL — AB (ref 70–99)
Glucose-Capillary: 149 mg/dL — ABNORMAL HIGH (ref 70–99)

## 2014-08-15 LAB — URINALYSIS, ROUTINE W REFLEX MICROSCOPIC
Bilirubin Urine: NEGATIVE
GLUCOSE, UA: NEGATIVE mg/dL
Hgb urine dipstick: NEGATIVE
Ketones, ur: NEGATIVE mg/dL
Leukocytes, UA: NEGATIVE
Nitrite: NEGATIVE
PROTEIN: NEGATIVE mg/dL
SPECIFIC GRAVITY, URINE: 1.028 (ref 1.005–1.030)
Urobilinogen, UA: 1 mg/dL (ref 0.0–1.0)
pH: 6.5 (ref 5.0–8.0)

## 2014-08-15 LAB — LIPID PANEL
CHOLESTEROL: 199 mg/dL (ref 0–200)
HDL: 25 mg/dL — ABNORMAL LOW (ref >39)
LDL CALC: 150 mg/dL — AB (ref 0–99)
Total CHOL/HDL Ratio: 8 RATIO
Triglycerides: 121 mg/dL (ref <150)
VLDL: 24 mg/dL (ref 0–40)

## 2014-08-15 LAB — CBG MONITORING, ED: GLUCOSE-CAPILLARY: 90 mg/dL (ref 70–99)

## 2014-08-15 LAB — MRSA PCR SCREENING: MRSA by PCR: NEGATIVE

## 2014-08-15 LAB — HEMOGLOBIN A1C
HEMOGLOBIN A1C: 6 % — AB (ref <5.7)
Mean Plasma Glucose: 126 mg/dL — ABNORMAL HIGH (ref <117)

## 2014-08-15 MED ORDER — ASPIRIN EC 81 MG PO TBEC
81.0000 mg | DELAYED_RELEASE_TABLET | Freq: Every day | ORAL | Status: DC
  Administered 2014-08-15 – 2014-08-24 (×10): 81 mg via ORAL
  Filled 2014-08-15 (×10): qty 1

## 2014-08-15 MED ORDER — STROKE: EARLY STAGES OF RECOVERY BOOK
Freq: Once | Status: AC
  Administered 2014-08-15: 04:00:00

## 2014-08-15 MED ORDER — TRAZODONE HCL 50 MG PO TABS: 50.0000 mg | ORAL_TABLET | Freq: Every evening | ORAL | Status: DC | PRN

## 2014-08-15 MED ORDER — LOSARTAN POTASSIUM 50 MG PO TABS
50.0000 mg | ORAL_TABLET | Freq: Every day | ORAL | Status: DC
  Administered 2014-08-15 – 2014-08-16 (×2): 50 mg via ORAL
  Filled 2014-08-15 (×2): qty 1

## 2014-08-15 MED ORDER — ATORVASTATIN CALCIUM 10 MG PO TABS
20.0000 mg | ORAL_TABLET | Freq: Every day | ORAL | Status: DC
Start: 2014-08-15 — End: 2014-08-24
  Administered 2014-08-15 – 2014-08-24 (×10): 20 mg via ORAL
  Filled 2014-08-15 (×10): qty 2

## 2014-08-15 MED ORDER — INSULIN ASPART 100 UNIT/ML ~~LOC~~ SOLN
0.0000 [IU] | Freq: Three times a day (TID) | SUBCUTANEOUS | Status: DC
  Administered 2014-08-15: 2 [IU] via SUBCUTANEOUS
  Administered 2014-08-15 – 2014-08-17 (×3): 1 [IU] via SUBCUTANEOUS
  Administered 2014-08-18: 2 [IU] via SUBCUTANEOUS
  Administered 2014-08-18 – 2014-08-22 (×7): 1 [IU] via SUBCUTANEOUS
  Administered 2014-08-23: 2 [IU] via SUBCUTANEOUS
  Administered 2014-08-23: 1 [IU] via SUBCUTANEOUS
  Administered 2014-08-24: 2 [IU] via SUBCUTANEOUS

## 2014-08-15 MED ORDER — PROPRANOLOL HCL 10 MG PO TABS
10.0000 mg | ORAL_TABLET | Freq: Two times a day (BID) | ORAL | Status: DC
  Administered 2014-08-15 – 2014-08-24 (×14): 10 mg via ORAL
  Filled 2014-08-15 (×22): qty 1

## 2014-08-15 MED ORDER — HYDROCHLOROTHIAZIDE 12.5 MG PO CAPS
12.5000 mg | ORAL_CAPSULE | Freq: Every day | ORAL | Status: DC
  Administered 2014-08-15 – 2014-08-16 (×2): 12.5 mg via ORAL
  Filled 2014-08-15 (×2): qty 1

## 2014-08-15 MED ORDER — LOSARTAN POTASSIUM-HCTZ 50-12.5 MG PO TABS: 1.0000 | ORAL_TABLET | Freq: Every day | ORAL | Status: DC

## 2014-08-15 MED ORDER — SODIUM CHLORIDE 0.9 % IV BOLUS (SEPSIS)
500.0000 mL | Freq: Once | INTRAVENOUS | Status: AC
  Administered 2014-08-15: 500 mL via INTRAVENOUS

## 2014-08-15 NOTE — Progress Notes (Signed)
Echocardiogram 2D Echocardiogram has been performed.  Garrett Turner 08/15/2014, 1:26 PM

## 2014-08-15 NOTE — Consult Note (Addendum)
Stroke Consult    Chief Complaint: right sided weakness  HPI: Garrett Turner is an 64 y.o. male who presented to ED with shortness of breath and lower extremity swelling. While in the ED noted to have acute onset of confusion, slurred speech and right sided weakness. Platelets noted to be 75,000 so not a tPA candidate. Per ED physician the symptoms rapidly improved, patient transferred to [facility] for further workup.   Patient describes an episode of confusion, where he could not understand people and could not get his words out. Also noted weakness of RUE and RLE. No prior history of this, no prior TIA or CVA. Notes chronic RUE distal>proximal weakness and numbness.  Date last known well: 08/14/2014 Time last known well: 2200 tPA Given: no, platelet count of 75,000 and rapidly improving symptoms  Past Medical History  Diagnosis Date  . Diabetes mellitus   . Hypertension   . High cholesterol   . Anxiety disorder   . Pancytopenia   . Inguinal hernia     Left  . Cirrhosis   . UTI (lower urinary tract infection)   . Depression   . Anxiety   . Leg cramps     Past Surgical History  Procedure Laterality Date  . Tonsillectomy    . Esophagogastroduodenoscopy  11/04/2011    Procedure: ESOPHAGOGASTRODUODENOSCOPY (EGD);  Surgeon: Arta Silence, MD;  Location: [facility];  Service: Endoscopy;  Laterality: N/A;  . Colonoscopy  11/04/2011    Procedure: COLONOSCOPY;  Surgeon: Arta Silence, MD;  Location: [facility];  Service: Endoscopy;  Laterality: N/A;  . Vasectomy  1985    Family History  Problem Relation Age of Onset  . Coronary artery disease    . Hypertension    . Heart failure Father   . Hypertension Father   . Heart disease Father   . Heart failure Mother   . Hypertension Mother   . Heart disease Mother   . Stroke Paternal Grandmother   . Obesity Daughter    Social History:  reports that he has never smoked. He has quit using smokeless tobacco. He reports that he drinks  about 1.1 oz of alcohol per week. He reports that he does not use illicit drugs.  Allergies: No Known Allergies  Medications Prior to Admission  Medication Sig Dispense Refill  . atorvastatin (LIPITOR) 20 MG tablet Take 1 tablet (20 mg total) by mouth daily. 30 tablet 3  . losartan-hydrochlorothiazide (HYZAAR) 50-12.5 MG per tablet Take 1 tablet by mouth daily. 90 tablet 3  . metFORMIN (GLUCOPHAGE) 1000 MG tablet Take 1 tablet (1,000 mg total) by mouth 2 (two) times daily with a meal. 60 tablet 3  . propranolol (INDERAL) 10 MG tablet Take 1 tablet (10 mg total) by mouth 2 (two) times daily. 180 tablet 3  . traZODone (DESYREL) 50 MG tablet Take 1 tablet (50 mg total) by mouth at bedtime as needed for sleep. 30 tablet 3  . amoxicillin-clavulanate (AUGMENTIN) 875-125 MG per tablet Take 1 tablet by mouth 2 (two) times daily. (Patient not taking: Reported on 08/14/2014) 20 tablet 0    ROS: Out of a complete 14 system review, the patient complains of only the following symptoms, and all other reviewed systems are negative.  CT head imaging reviewed and overall unremarkable.   Physical Examination: Filed Vitals:   08/15/14 0316  BP: 114/69  Pulse: 57  Temp: 97.7 F (36.5 C)  Resp: 20   Physical Exam  Constitutional: He appears well-developed and well-nourished.  Psych: Affect appropriate to situation Eyes: No scleral injection HENT: No OP obstrucion Head: Normocephalic.  Cardiovascular: Normal rate and regular rhythm.  Respiratory: Effort normal and breath sounds normal.  GI: Soft. Bowel sounds are normal. No distension. There is no tenderness.  Skin: bilateral LE pitting edema   Neurologic Examination: Mental Status: Alert, oriented, thought content appropriate.  Mild expressive aphasia. No dysarthria.  Some difficulty following multi-step commands. Cranial Nerves: II: funduscopic exam wnl bilaterally, visual fields grossly normal, pupils equal, round, reactive to light and  accommodation III,IV, VI: ptosis not present, extra-ocular motions intact bilaterally V,VII: smile symmetric, facial light touch sensation normal bilaterally VIII: hearing normal bilaterally IX,X: gag reflex present XI: trapezius strength/neck flexion strength normal bilaterally XII: tongue strength normal  Motor: LUE 5/5 strength proximal and distal RUE drift noted, proximal 4+/5, distal 4+/5 LLE proximal 4/5, distal 5/5 RLE proximal 3/5, distal 5-/5 Tone and bulk:normal tone throughout; no atrophy noted Sensory: decreased LT on right side Deep Tendon Reflexes: 2+ and symmetric biceps and brachioradialis, 1+ patellar, absent AJs bilat Plantars: Right: downgoing   Left: downgoing Cerebellar: normal finger-to-nose, unable to test HTS Gait: deferred  Laboratory Studies:   Basic Metabolic Panel:  Recent Labs Lab 08/14/14 1725 08/14/14 2223  NA 134* 137  K 3.5 3.8  CL 106 105  CO2 21  --   GLUCOSE 105* 98  BUN 15 14  CREATININE 0.88 0.90  CALCIUM 8.3*  --     Liver Function Tests:  Recent Labs Lab 08/14/14 1725  AST 75*  ALT 34  ALKPHOS 121*  BILITOT 3.4*  PROT 7.1  ALBUMIN 2.9*   No results for input(s): LIPASE, AMYLASE in the last 168 hours. No results for input(s): AMMONIA in the last 168 hours.  CBC:  Recent Labs Lab 08/14/14 1725 08/14/14 2209 08/14/14 2223  WBC 4.8 3.7*  --   NEUTROABS  --  2.2  --   HGB 10.5* 10.2* 10.9*  HCT 31.7* 29.8* 32.0*  MCV 92.7 92.0  --   PLT 75* 48*  --     Cardiac Enzymes: No results for input(s): CKTOTAL, CKMB, CKMBINDEX, TROPONINI in the last 168 hours.  BNP: Invalid input(s): POCBNP  CBG:  Recent Labs Lab 08/14/14 2150  GLUCAP 57    Microbiology: Results for orders placed or performed during the hospital encounter of 04/13/12  MRSA PCR Screening     Status: Abnormal   Collection Time: 04/13/12 12:58 PM  Result Value Ref Range Status   MRSA by PCR POSITIVE (A) NEGATIVE Final    Comment:         The GeneXpert MRSA Assay (FDA approved for NASAL specimens only), is one component of a comprehensive MRSA colonization surveillance program. It is not intended to diagnose MRSA infection nor to guide or monitor treatment for MRSA infections. RESULT CALLED TO, READ BACK BY AND VERIFIED WITH: ASHLEY,A. AT [phone] ON [id number] BY LOVE,T.    Coagulation Studies:  Recent Labs  08/14/14 1725  LABPROT 19.5*  INR 1.63*    Urinalysis:  Recent Labs Lab 08/14/14 2337  COLORURINE YELLOW  LABSPEC 1.028  PHURINE 6.5  GLUCOSEU NEGATIVE  HGBUR NEGATIVE  BILIRUBINUR NEGATIVE  KETONESUR NEGATIVE  PROTEINUR NEGATIVE  UROBILINOGEN 1.0  NITRITE NEGATIVE  LEUKOCYTESUR NEGATIVE    Lipid Panel:     Component Value Date/Time   CHOL 189 06/19/2014 1210   TRIG 117 06/19/2014 1210   HDL 29* 06/19/2014 1210   CHOLHDL 6.5 06/19/2014 1210  VLDL 23 06/19/2014 1210   LDLCALC 137* 06/19/2014 1210    HgbA1C:  Lab Results  Component Value Date   HGBA1C 6.0 06/19/2014    Urine Drug Screen:     Component Value Date/Time   LABOPIA NONE DETECTED 08/14/2014 2337   COCAINSCRNUR NONE DETECTED 08/14/2014 2337   LABBENZ NONE DETECTED 08/14/2014 2337   AMPHETMU NONE DETECTED 08/14/2014 2337   THCU NONE DETECTED 08/14/2014 2337   LABBARB NONE DETECTED 08/14/2014 2337    Alcohol Level:  Recent Labs Lab 08/14/14 2209  [place] <5    Other results: EKG: normal sinus rhythm.  Imaging: Ct Head Wo Contrast  08/14/2014   CLINICAL DATA:  Cold stroke, syncope, new onset right-sided weakness  EXAM: CT HEAD WITHOUT CONTRAST  TECHNIQUE: Contiguous axial images were obtained from the base of the skull through the vertex without intravenous contrast.  COMPARISON:  06/18/2011  FINDINGS: No skull fracture is noted. No intracranial hemorrhage, mass effect or midline shift. No definite acute cortical infarction. No mass lesion is noted on this unenhanced scan. There is residual vascular enhancement from CT scan  of the chest same day.  IMPRESSION: No intracranial hemorrhage, mass effect or midline shift. No definite acute cortical infarct. These results were called by telephone at the time of interpretation on 08/14/2014 at 10:09 pm to Dr. Dorie Rank , who verbally acknowledged these results.   Electronically Signed   By: Lahoma Crocker M.D.   On: 08/14/2014 22:09   Ct Angio Chest Pe W/cm &/or Wo Cm  08/14/2014   CLINICAL DATA:  Severe dyspnea with fatigue.  EXAM: CT ANGIOGRAPHY CHEST WITH CONTRAST  TECHNIQUE: Multidetector CT imaging of the chest was performed using the standard protocol during bolus administration of intravenous contrast. Multiplanar CT image reconstructions and MIPs were obtained to evaluate the vascular anatomy.  CONTRAST:  117mL OMNIPAQUE IOHEXOL 350 MG/ML SOLN  COMPARISON:  None.  FINDINGS: Cardiovascular: There is good opacification of the pulmonary arteries with no evidence of pulmonary embolism. The thoracic aorta is normal in caliber and intact.  Lungs: There is extensive interstitial thickening. This probably represents interstitial edema but a component of interstitial fibrosis cannot be excluded. There is a 13 mm spiculated opacity in the right upper lobe laterally and there are a few additional scattered areas of ground-glass opacity in the periphery of both lungs. These could represent more confluent areas of interstitial abnormality but follow-up is necessary to exclude persistence of nodules after resolution of the interstitial fluid.  Central airways: Patent  Effusions: Moderate left pleural effusion. No right pleural effusion.  Lymphadenopathy: There are a few scattered benign appearing axillary nodes. No hilar or mediastinal adenopathy is evident.  Esophagus: Unremarkable  Upper abdomen: Marked cirrhotic appearing irregularities of the liver. Splenic enlargement. Probable large varices around the distal esophagus and EG junction. Large volume ascites.  Musculoskeletal: No acute findings   Review of the MIP images confirms the above findings.  IMPRESSION: *Negative for pulmonary embolism. *Extensive interstitial thickening, likely pulmonary edema *Can not exclude a 13 mm right upper lobe nodule although it may merely represent more confluent interstitial abnormality. Follow-up CT recommended after resolution of the interstitial infiltrates/edema. *Moderately large left pleural effusion *Cirrhosis with portal hypertension and large volume peritoneal ascites   Electronically Signed   By: Andreas Newport M.D.   On: 08/14/2014 22:00   Dg Chest Port 1 View  08/14/2014   CLINICAL DATA:  Shortness of Breath, history of cirrhosis  EXAM: PORTABLE CHEST - 1  VIEW  COMPARISON:  10/17/2013  FINDINGS: Cardiomediastinal silhouette is stable. Mild interstitial prominence bilaterally without pulmonary edema. No segmental infiltrate.  IMPRESSION: Mild interstitial prominence bilaterally without convincing pulmonary edema. No segmental infiltrate.   Electronically Signed   By: Lahoma Crocker M.D.   On: 08/14/2014 17:54    Assessment: 63 y.o. male presenting with shortness of breath and lower extremity swelling. While in ED acutely developed confusion, word finding difficulty and right sided weakness. Platelets noted to be 75,000 so not a tPA candidate. Symptoms noted to be rapidly improving in the ED. Based on description some of his RUE weakness/sensory changes may be related to underlying cervical radiculopathy but this would not explain acute onset of LE weakness.Transferred to [facility] for further stroke workup.   Stroke Risk Factors - HTN, DM, HLD  Plan: 1. HgbA1c, fasting lipid panel 2. MRI, MRA  of the brain without contrast 3. PT consult, OT consult, Speech consult 4. Echocardiogram 5. Carotid dopplers 6. Prophylactic therapy-ASA 325mg  7. Risk factor modification 8. Telemetry monitoring 9. Frequent neuro checks 10. NPO until RN stroke swallow screen    Jim Like,  DO [facility] [phone]  If 7pm- 7am, please page neurology on call as listed in [place]. 08/15/2014, 3:21 AM

## 2014-08-15 NOTE — Progress Notes (Signed)
Chaplain responded to spiritual care consult for advanced directive. Chaplain gave pt advanced directive materials and instructions. Chaplain asked pt to have nurse page chaplain should he have any questions. Chaplain offered to keep pt in her prayers. Page chaplain as needed.   08/15/14 1000  Clinical Encounter Type  Visited With Patient;Health care provider  Visit Type Initial;Spiritual support  Referral From Nurse  Spiritual Encounters  Spiritual Needs Prayer  Stress Factors  Patient Stress Factors Exhausted  Fayrene Towner, Barbette Hair, Chaplain 08/15/2014 10:11 AM

## 2014-08-15 NOTE — Progress Notes (Signed)
   08/15/14 1330  Vitals  Temp 98.6 F (37 C)  Temp Source Oral  BP (!) 81/53 mmHg  BP Location Right Arm  BP Method Automatic  Patient Position (if appropriate) Lying  Pulse Rate 71  Pulse Rate Source Dinamap  Resp 16  Oxygen Therapy  SpO2 92 %  O2 Device Room Air  Dr Clementeen Graham was paged at [phone] concerning pt VS. No order was received

## 2014-08-15 NOTE — H&P (Signed)
[facility] History and Physical  Garrett Turner NLG:[id number] DOB: [birth year] DOA: 08/14/2014  PCP: Lorayne Marek, MD [place] Specialists: Dr. Paulita Fujita, GI  Chief Complaint: SOB  HPI: Garrett Turner is a 64 y.o. male who came to the [facility] ED 08/15/2014 after being referred by his gastroenterologist.  He reported mild diarrhea (after a 10-day course of Augmentin for cough productive of green sputum) and progressive SOB, DOE, swelling, and weakness.  The patient was sent to the ER for eval of the SOB and developed the acute onset of right sided weakness and speech deficits after returning from CT scan of his chest.  Code Stroke was initiated by the ED physician.  Patient was not a candidate for TPA due to thrombocytopenia.  He will require transfer to [facility] for further evaluation and treatment.  This is a very pleasant, but unfortunate widower, who has a hx of cirrhosis.  Of note, he is not on lasix or aldactone at home.  Review of Systems: 12 systems reviewed and negative except as stated in HPI.  Past Medical History  Diagnosis Date  . Diabetes mellitus   . Hypertension   . High cholesterol   . Pancytopenia   . Cirrhosis   . Depression   . Anxiety   . Leg cramps    Past Surgical History  Procedure Laterality Date  . Tonsillectomy    . Vasectomy  1985   Social History:  History   Social History Narrative  No tobacco, EtOH, or illicit drug use.  Wife is deceased.  He still works at the [employer].  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  Lives alone.  No Known Allergies  Family History  Problem Relation Age of Onset  . Coronary artery disease    . Hypertension    . Heart failure Father   . Hypertension Father   . Heart disease Father   . Heart failure Mother   . Hypertension Mother   . Heart disease Mother   . Stroke Paternal Grandmother   . Obesity Daughter     Prior to Admission medications   Medication Sig Start Date End Date  Taking? Authorizing Provider  atorvastatin (LIPITOR) 20 MG tablet Take 1 tablet (20 mg total) by mouth daily. 06/19/14  Yes Lorayne Marek, MD  losartan-hydrochlorothiazide (HYZAAR) 50-12.5 MG per tablet Take 1 tablet by mouth daily. 06/19/14  Yes Lorayne Marek, MD  metFORMIN (GLUCOPHAGE) 1000 MG tablet Take 1 tablet (1,000 mg total) by mouth 2 (two) times daily with a meal. 06/19/14  Yes Deepak Advani, MD  propranolol (INDERAL) 10 MG tablet Take 1 tablet (10 mg total) by mouth 2 (two) times daily. 06/19/14  Yes Lorayne Marek, MD  traZODone (DESYREL) 50 MG tablet Take 1 tablet (50 mg total) by mouth at bedtime as needed for sleep. 06/19/14  Yes Lorayne Marek, MD   Physical Exam: Filed Vitals:   08/15/14 0000 08/15/14 0015 08/15/14 0030 08/15/14 0108  BP: 115/63 100/62 112/57 106/71  Pulse: 46 50 52 70  Temp:      TempSrc:      Resp: 18 17 19 18   SpO2: 97% 99% 99% 99%     General:  Awake and oriented x4, NAD  Eyes: PERRL bilaterally  ENT: No oral lesions  Neck: No carotid bruit, supple  Cardiovascular: NR/RR  Respiratory: Diminished breath sounds at lung bases.  No wheeze or ronchi.  Abdomen: Fullness to RUQ but no significant tenderness or guarding  Skin: Petechiae to bilateral  lower extremity.  2+ pitting edema in bilateral lower extremities.  Musculoskeletal: Moves all four extremities though right sided weakness noted  Psychiatric: Flat affect but conversation appropriate; excellent historian.  Neurologic: Has difficulty deviating his tongue to the right.  Decreased grip strength in right hand.  Strength 4/5 in right sided upper and lower extremities.  Labs on Admission:  Basic Metabolic Panel:  Recent Labs Lab 08/14/14 1725 08/14/14 2223  NA 134* 137  K 3.5 3.8  CL 106 105  CO2 21  --   GLUCOSE 105* 98  BUN 15 14  CREATININE 0.88 0.90  CALCIUM 8.3*  --    Liver Function Tests:  Recent Labs Lab 08/14/14 1725  AST 75*  ALT 34  ALKPHOS 121*  BILITOT 3.4*   PROT 7.1  ALBUMIN 2.9*   CBC:  Recent Labs Lab 08/14/14 1725 08/14/14 2209 08/14/14 2223  WBC 4.8 3.7*  --   NEUTROABS  --  2.2  --   HGB 10.5* 10.2* 10.9*  HCT 31.7* 29.8* 32.0*  MCV 92.7 92.0  --   PLT 75* 48*  --     Recent Labs  10/17/13 1324  PROBNP 121.1   CBG:  Recent Labs Lab 08/14/14 2150  GLUCAP 90    Radiological Exams on Admission: Ct Head Wo Contrast  08/14/2014   CLINICAL DATA:  Cold stroke, syncope, new onset right-sided weakness  EXAM: CT HEAD WITHOUT CONTRAST  TECHNIQUE: Contiguous axial images were obtained from the base of the skull through the vertex without intravenous contrast.  COMPARISON:  06/18/2011  FINDINGS: No skull fracture is noted. No intracranial hemorrhage, mass effect or midline shift. No definite acute cortical infarction. No mass lesion is noted on this unenhanced scan. There is residual vascular enhancement from CT scan of the chest same day.  IMPRESSION: No intracranial hemorrhage, mass effect or midline shift. No definite acute cortical infarct. These results were called by telephone at the time of interpretation on 08/14/2014 at 10:09 pm to Dr. Dorie Rank , who verbally acknowledged these results.   Electronically Signed   By: Lahoma Crocker M.D.   On: 08/14/2014 22:09   Ct Angio Chest Pe W/cm &/or Wo Cm  08/14/2014   CLINICAL DATA:  Severe dyspnea with fatigue.  EXAM: CT ANGIOGRAPHY CHEST WITH CONTRAST  TECHNIQUE: Multidetector CT imaging of the chest was performed using the standard protocol during bolus administration of intravenous contrast. Multiplanar CT image reconstructions and MIPs were obtained to evaluate the vascular anatomy.  CONTRAST:  170mL OMNIPAQUE IOHEXOL 350 MG/ML SOLN  COMPARISON:  None.  FINDINGS: Cardiovascular: There is good opacification of the pulmonary arteries with no evidence of pulmonary embolism. The thoracic aorta is normal in caliber and intact.  Lungs: There is extensive interstitial thickening. This probably  represents interstitial edema but a component of interstitial fibrosis cannot be excluded. There is a 13 mm spiculated opacity in the right upper lobe laterally and there are a few additional scattered areas of ground-glass opacity in the periphery of both lungs. These could represent more confluent areas of interstitial abnormality but follow-up is necessary to exclude persistence of nodules after resolution of the interstitial fluid.  Central airways: Patent  Effusions: Moderate left pleural effusion. No right pleural effusion.  Lymphadenopathy: There are a few scattered benign appearing axillary nodes. No hilar or mediastinal adenopathy is evident.  Esophagus: Unremarkable  Upper abdomen: Marked cirrhotic appearing irregularities of the liver. Splenic enlargement. Probable large varices around the distal esophagus and EG  junction. Large volume ascites.  Musculoskeletal: No acute findings  Review of the MIP images confirms the above findings.  IMPRESSION: *Negative for pulmonary embolism. *Extensive interstitial thickening, likely pulmonary edema *Can not exclude a 13 mm right upper lobe nodule although it may merely represent more confluent interstitial abnormality. Follow-up CT recommended after resolution of the interstitial infiltrates/edema. *Moderately large left pleural effusion *Cirrhosis with portal hypertension and large volume peritoneal ascites   Electronically Signed   By: Andreas Newport M.D.   On: 08/14/2014 22:00   Dg Chest Port 1 View  08/14/2014   CLINICAL DATA:  Shortness of Breath, history of cirrhosis  EXAM: PORTABLE CHEST - 1 VIEW  COMPARISON:  10/17/2013  FINDINGS: Cardiomediastinal silhouette is stable. Mild interstitial prominence bilaterally without pulmonary edema. No segmental infiltrate.  IMPRESSION: Mild interstitial prominence bilaterally without convincing pulmonary edema. No segmental infiltrate.   Electronically Signed   By: Lahoma Crocker M.D.   On: 08/14/2014 17:54    Assessment/Plan Active Problems:   Acute CVA (cerebrovascular accident)   TIA (transient ischemic attack)  Anasarca Pulmonary Edema Possible RUL pulmonary follow-up which will require follow-up after diuresis Moderate protein calorie malnutrition Hx of cirrhosis Probable anemia of chronic disease, liver Thrombocytopenia  Admit to inpatient, telemetry, transfer to [facility] for neurology consult Will likely need GI consult in the AM as well.  Will benefit from long-term diuretic therapy given liver disease. MRI brain, carotid ultrasound, echo ordered per CVA protocol.  Aspirin deferred due to thrombocytopenia. Continue home medications as appropriate. SSI coverage for now.  Code Status: FULL Family Communication: I offered to call the patient's daughter and he declined.  He would like to contact her in the AM (she has small children). Disposition Plan: Expect he will stay at least two midnights.    Time spent: 60 minutes  [facility] Pager [phone]  If 7PM-7AM, please contact night-coverage [email or web address] Password [email or web address] 08/15/2014, 1:57 AM

## 2014-08-15 NOTE — Progress Notes (Signed)
Pt arrived via [facility] and VS stable and reports no pain. Garrett Turner E, [place] 08/15/2014 [phone]

## 2014-08-15 NOTE — Progress Notes (Signed)
Bilateral carotid artery duplex completed:  1-39% ICA stenosis.  Vertebral artery flow is antegrade.     

## 2014-08-15 NOTE — Progress Notes (Addendum)
[facility] PROGRESS NOTE  Garrett Turner ZOX:[id number] DOB: 06-24-1951 DOA: 08/14/2014 PCP: Lorayne Marek, MD  Brief narrative: 64 y/o male with DM2, HTN, with NASH cirrhosis ( sees Dr Ethlyn Daniels) presented to [facility] for dyspnea and leg swelling by his GI. In ED developed  rt sided weakness and speech impariment. transferred from [facility] post admission for stroke w/up. Head CT negative. No TPA given due to low platelets.   Assessment/Plan: ?TIA vs acute CVA  head CT negative MRI brain, MRA head negative , 2d echo and carotid doppler.  PT/OT eval. Allow permissive BP. LDL of 150. Continue lipitor. No aspirin due to low platelets. (on reviewing PCP notes earlier this month, he was on baby aspirin.) Will defer to stroke team) Neuro consult appreciated.  Cirrhosis of liver with ? Acute decompensation Secondary to NASH. Has been following with Dr Paulita Fujita. Calculated MELD score is 12 this admission.  Spoke with Dr Paulita Fujita who will ask his partner to see pt in am. Continue  propanolol  Pulmonary edema Appears euvolemic. Dyspnea resolved with 1 dose of IV lasix. BNP normal.  No signs of ascites. Check 2D echo.   Hx of gallstones Patient seen by surgery and given underlying cirrhosis  Have recommended to hold off on sx at this time.  DM type 2  last A1C of 6  on metformin as outpt. Monitor on SSI.  Rt upper lobe nodule  13 mm RUL nodule seen on CTA. Recommended follow up CT after resolution of  interstitial infiltrate/ edema. Can be repeated in 4 -6 weeks as outpt  DVT prophylaxis: SCD  Diet: heart healthy    Code Status:full code Family Communication: none at bedside Disposition Plan: pending completion of w/up . PT eval. home once improved and w/up completed.   Consultants:  neuro  Procedures:  MRI brain  2 d echo  Antibiotics:  none  HPI/Subjective: Seen and examined. Denies any dyspnea. Reports rt sided weakness to be better.   Objective: Filed Vitals:    08/15/14 0730  BP: 102/59  Pulse: 58  Temp: 98.6 F (37 C)  Resp:     Intake/Output Summary (Last 24 hours) at 08/15/14 0919 Last data filed at 08/15/14 0603  Gross per 24 hour  Intake      0 ml  Output    150 ml  Net   -150 ml   Filed Weights   08/15/14 0316  Weight: 84.687 kg (186 lb 11.2 oz)    Exam:   General:  Elderly male lying in bed sleepy  HEENT: no pallor, icteric, moist mucosa  Chest: clear b/l   CVS: NS1&S2, no murmurs, rubs or gallop  Abd: soft, NT, ND, BS+  EXT: warm, no edema  CNS: sleepy but arousable and oriented, cranial nerves intact, power 4+5 in rt extremity, normal sensations.   Data Reviewed: Basic Metabolic Panel:  Recent Labs Lab 08/14/14 1725 08/14/14 2223  NA 134* 137  K 3.5 3.8  CL 106 105  CO2 21  --   GLUCOSE 105* 98  BUN 15 14  CREATININE 0.88 0.90  CALCIUM 8.3*  --    Liver Function Tests:  Recent Labs Lab 08/14/14 1725  AST 75*  ALT 34  ALKPHOS 121*  BILITOT 3.4*  PROT 7.1  ALBUMIN 2.9*   No results for input(s): LIPASE, AMYLASE in the last 168 hours. No results for input(s): AMMONIA in the last 168 hours. CBC:  Recent Labs Lab 08/14/14 1725 08/14/14 2209 08/14/14 2223  WBC 4.8  3.7*  --   NEUTROABS  --  2.2  --   HGB 10.5* 10.2* 10.9*  HCT 31.7* 29.8* 32.0*  MCV 92.7 92.0  --   PLT 75* 48*  --    Cardiac Enzymes: No results for input(s): CKTOTAL, CKMB, CKMBINDEX, TROPONINI in the last 168 hours. BNP (last 3 results)  Recent Labs  10/17/13 1324  PROBNP 121.1   CBG:  Recent Labs Lab 08/14/14 2150  GLUCAP 90    No results found for this or any previous visit (from the past 240 hour(s)).   Studies: Ct Head Wo Contrast  08/14/2014   CLINICAL DATA:  Cold stroke, syncope, new onset right-sided weakness  EXAM: CT HEAD WITHOUT CONTRAST  TECHNIQUE: Contiguous axial images were obtained from the base of the skull through the vertex without intravenous contrast.  COMPARISON:  06/18/2011   FINDINGS: No skull fracture is noted. No intracranial hemorrhage, mass effect or midline shift. No definite acute cortical infarction. No mass lesion is noted on this unenhanced scan. There is residual vascular enhancement from CT scan of the chest same day.  IMPRESSION: No intracranial hemorrhage, mass effect or midline shift. No definite acute cortical infarct. These results were called by telephone at the time of interpretation on 08/14/2014 at 10:09 pm to Dr. Dorie Rank , who verbally acknowledged these results.   Electronically Signed   By: Lahoma Crocker M.D.   On: 08/14/2014 22:09   Ct Angio Chest Pe W/cm &/or Wo Cm  08/14/2014   CLINICAL DATA:  Severe dyspnea with fatigue.  EXAM: CT ANGIOGRAPHY CHEST WITH CONTRAST  TECHNIQUE: Multidetector CT imaging of the chest was performed using the standard protocol during bolus administration of intravenous contrast. Multiplanar CT image reconstructions and MIPs were obtained to evaluate the vascular anatomy.  CONTRAST:  164mL OMNIPAQUE IOHEXOL 350 MG/ML SOLN  COMPARISON:  None.  FINDINGS: Cardiovascular: There is good opacification of the pulmonary arteries with no evidence of pulmonary embolism. The thoracic aorta is normal in caliber and intact.  Lungs: There is extensive interstitial thickening. This probably represents interstitial edema but a component of interstitial fibrosis cannot be excluded. There is a 13 mm spiculated opacity in the right upper lobe laterally and there are a few additional scattered areas of ground-glass opacity in the periphery of both lungs. These could represent more confluent areas of interstitial abnormality but follow-up is necessary to exclude persistence of nodules after resolution of the interstitial fluid.  Central airways: Patent  Effusions: Moderate left pleural effusion. No right pleural effusion.  Lymphadenopathy: There are a few scattered benign appearing axillary nodes. No hilar or mediastinal adenopathy is evident.  Esophagus:  Unremarkable  Upper abdomen: Marked cirrhotic appearing irregularities of the liver. Splenic enlargement. Probable large varices around the distal esophagus and EG junction. Large volume ascites.  Musculoskeletal: No acute findings  Review of the MIP images confirms the above findings.  IMPRESSION: *Negative for pulmonary embolism. *Extensive interstitial thickening, likely pulmonary edema *Can not exclude a 13 mm right upper lobe nodule although it may merely represent more confluent interstitial abnormality. Follow-up CT recommended after resolution of the interstitial infiltrates/edema. *Moderately large left pleural effusion *Cirrhosis with portal hypertension and large volume peritoneal ascites   Electronically Signed   By: Andreas Newport M.D.   On: 08/14/2014 22:00   Mr Jodene Nam Head Wo Contrast  08/15/2014   CLINICAL DATA:  Right foot weakness.  Confusion and slurred speech  EXAM: MRI HEAD WITHOUT CONTRAST  MRA HEAD WITHOUT CONTRAST  TECHNIQUE: Multiplanar, multiecho pulse sequences of the brain and surrounding structures were obtained without intravenous contrast. Angiographic images of the head were obtained using MRA technique without contrast.  COMPARISON:  CT head 08/14/2014  FINDINGS: MRI HEAD FINDINGS  Negative for acute infarct.  Mild chronic microvascular ischemic changes in the white matter. Mild chronic ischemia in the pons.  Ventricle size is normal. Cerebral volume is normal for age. Pituitary is normal in size.  Negative for intracranial hemorrhage or fluid collection.  Negative for mass or edema.  Mucosal edema in the sphenoid sinus with retained secretions.  MRA HEAD FINDINGS  Both vertebral arteries are patent to the basilar without stenosis. Left PICA patent. Right PICA not visualized. AICA, superior cerebellar, and posterior cerebral arteries are patent without significant stenosis  Internal carotid artery is widely patent bilaterally. Hypoplastic right A1 segment, a normal variant. Both  A2 segments are supplied from the left without stenosis. Middle cerebral arteries are patent bilaterally.  Negative for cerebral aneurysm.  IMPRESSION: Negative for acute infarct. Mild chronic microvascular ischemic change in the white matter and pons  Sphenoid sinusitis  Negative MRA head   Electronically Signed   By: Franchot Gallo M.D.   On: 08/15/2014 09:06   Mri Brain Without Contrast  08/15/2014   CLINICAL DATA:  Right foot weakness.  Confusion and slurred speech  EXAM: MRI HEAD WITHOUT CONTRAST  MRA HEAD WITHOUT CONTRAST  TECHNIQUE: Multiplanar, multiecho pulse sequences of the brain and surrounding structures were obtained without intravenous contrast. Angiographic images of the head were obtained using MRA technique without contrast.  COMPARISON:  CT head 08/14/2014  FINDINGS: MRI HEAD FINDINGS  Negative for acute infarct.  Mild chronic microvascular ischemic changes in the white matter. Mild chronic ischemia in the pons.  Ventricle size is normal. Cerebral volume is normal for age. Pituitary is normal in size.  Negative for intracranial hemorrhage or fluid collection.  Negative for mass or edema.  Mucosal edema in the sphenoid sinus with retained secretions.  MRA HEAD FINDINGS  Both vertebral arteries are patent to the basilar without stenosis. Left PICA patent. Right PICA not visualized. AICA, superior cerebellar, and posterior cerebral arteries are patent without significant stenosis  Internal carotid artery is widely patent bilaterally. Hypoplastic right A1 segment, a normal variant. Both A2 segments are supplied from the left without stenosis. Middle cerebral arteries are patent bilaterally.  Negative for cerebral aneurysm.  IMPRESSION: Negative for acute infarct. Mild chronic microvascular ischemic change in the white matter and pons  Sphenoid sinusitis  Negative MRA head   Electronically Signed   By: Franchot Gallo M.D.   On: 08/15/2014 09:06   Dg Chest Port 1 View  08/14/2014   CLINICAL DATA:   Shortness of Breath, history of cirrhosis  EXAM: PORTABLE CHEST - 1 VIEW  COMPARISON:  10/17/2013  FINDINGS: Cardiomediastinal silhouette is stable. Mild interstitial prominence bilaterally without pulmonary edema. No segmental infiltrate.  IMPRESSION: Mild interstitial prominence bilaterally without convincing pulmonary edema. No segmental infiltrate.   Electronically Signed   By: Lahoma Crocker M.D.   On: 08/14/2014 17:54    Scheduled Meds: . atorvastatin  20 mg Oral Daily  . hydrochlorothiazide  12.5 mg Oral Daily  . insulin aspart  0-9 Units Subcutaneous TID WC  . losartan  50 mg Oral Daily  . propranolol  10 mg Oral BID   Continuous Infusions:     Time spent: 25 minutes    Louellen Molder  [facility] Pager [phone] If  7PM-7AM, please contact night-coverage at [email or web address], password [facility] 08/15/2014, 9:19 AM  LOS: 1 day

## 2014-08-15 NOTE — Progress Notes (Signed)
UR complete.  Carlyle Mcelrath RN, MSN 

## 2014-08-15 NOTE — Progress Notes (Signed)
STROKE TEAM PROGRESS NOTE   HISTORY Garrett Turner is an 64 y.o. male who presented to ED with shortness of breath and lower extremity swelling. While in the ED noted to have acute onset of confusion, slurred speech and right sided weakness. Platelets noted to be 75,000 so not a tPA candidate. Per ED physician the symptoms rapidly improved, patient transferred to [facility] for further workup. LKW 08/14/2014 at 2200.  Patient describes an episode of confusion, where he could not understand people and could not get his words out. Also noted weakness of RUE and RLE. No prior history of this, no prior TIA or CVA. Notes chronic RUE distal>proximal weakness and numbness.   Patient was not administered TPA secondary to platelet count of 75,000 and rapidly improving symptoms. He was admitted for further evaluation and treatment.   SUBJECTIVE (INTERVAL HISTORY) His RN is at the bedside.  Overall he feels his condition is stable.    OBJECTIVE Temp:  [96.7 F (35.9 C)-98.6 F (37 C)] 98.6 F (37 C) (01/27 0930) Pulse Rate:  [43-70] 59 (01/27 0930) Cardiac Rhythm:  [-] Sinus bradycardia (01/27 0800) Resp:  [16-25] 16 (01/27 0930) BP: (100-125)/(57-72) 100/60 mmHg (01/27 0930) SpO2:  [96 %-100 %] 98 % (01/27 0930) Weight:  [84.687 kg (186 lb 11.2 oz)] 84.687 kg (186 lb 11.2 oz) (01/27 0316)   Recent Labs Lab 08/14/14 2150  GLUCAP 90    Recent Labs Lab 08/14/14 1725 08/14/14 2223  NA 134* 137  K 3.5 3.8  CL 106 105  CO2 21  --   GLUCOSE 105* 98  BUN 15 14  CREATININE 0.88 0.90  CALCIUM 8.3*  --     Recent Labs Lab 08/14/14 1725  AST 75*  ALT 34  ALKPHOS 121*  BILITOT 3.4*  PROT 7.1  ALBUMIN 2.9*    Recent Labs Lab 08/14/14 1725 08/14/14 2209 08/14/14 2223  WBC 4.8 3.7*  --   NEUTROABS  --  2.2  --   HGB 10.5* 10.2* 10.9*  HCT 31.7* 29.8* 32.0*  MCV 92.7 92.0  --   PLT 75* 48*  --    No results for input(s): CKTOTAL, CKMB, CKMBINDEX, TROPONINI in the last 168  hours.  Recent Labs  08/14/14 1725  LABPROT 19.5*  INR 1.63*    Recent Labs  08/14/14 2337  COLORURINE YELLOW  LABSPEC 1.028  PHURINE 6.5  GLUCOSEU NEGATIVE  HGBUR NEGATIVE  BILIRUBINUR NEGATIVE  KETONESUR NEGATIVE  PROTEINUR NEGATIVE  UROBILINOGEN 1.0  NITRITE NEGATIVE  LEUKOCYTESUR NEGATIVE       Component Value Date/Time   CHOL 199 08/15/2014 0633   TRIG 121 08/15/2014 0633   HDL 25* 08/15/2014 0633   CHOLHDL 8.0 08/15/2014 0633   VLDL 24 08/15/2014 0633   LDLCALC 150* 08/15/2014 0633   Lab Results  Component Value Date   HGBA1C 6.0 06/19/2014      Component Value Date/Time   LABOPIA NONE DETECTED 08/15/2014 0440   COCAINSCRNUR NONE DETECTED 08/15/2014 0440   LABBENZ NONE DETECTED 08/15/2014 0440   AMPHETMU NONE DETECTED 08/15/2014 0440   THCU NONE DETECTED 08/15/2014 0440   LABBARB NONE DETECTED 08/15/2014 0440     Recent Labs Lab 08/14/14 2209  ETH <5    Ct Head Wo Contrast  08/14/2014   CLINICAL DATA:  Cold stroke, syncope, new onset right-sided weakness  EXAM: CT HEAD WITHOUT CONTRAST  TECHNIQUE: Contiguous axial images were obtained from the base of the skull through the vertex without intravenous contrast.  COMPARISON:  06/18/2011  FINDINGS: No skull fracture is noted. No intracranial hemorrhage, mass effect or midline shift. No definite acute cortical infarction. No mass lesion is noted on this unenhanced scan. There is residual vascular enhancement from CT scan of the chest same day.  IMPRESSION: No intracranial hemorrhage, mass effect or midline shift. No definite acute cortical infarct. These results were called by telephone at the time of interpretation on 08/14/2014 at 10:09 pm to Dr. Dorie Rank , who verbally acknowledged these results.   Electronically Signed   By: Lahoma Crocker M.D.   On: 08/14/2014 22:09   Ct Angio Chest Pe W/cm &/or Wo Cm  08/14/2014   CLINICAL DATA:  Severe dyspnea with fatigue.  EXAM: CT ANGIOGRAPHY CHEST WITH CONTRAST   TECHNIQUE: Multidetector CT imaging of the chest was performed using the standard protocol during bolus administration of intravenous contrast. Multiplanar CT image reconstructions and MIPs were obtained to evaluate the vascular anatomy.  CONTRAST:  128mL OMNIPAQUE IOHEXOL 350 MG/ML SOLN  COMPARISON:  None.  FINDINGS: Cardiovascular: There is good opacification of the pulmonary arteries with no evidence of pulmonary embolism. The thoracic aorta is normal in caliber and intact.  Lungs: There is extensive interstitial thickening. This probably represents interstitial edema but a component of interstitial fibrosis cannot be excluded. There is a 13 mm spiculated opacity in the right upper lobe laterally and there are a few additional scattered areas of ground-glass opacity in the periphery of both lungs. These could represent more confluent areas of interstitial abnormality but follow-up is necessary to exclude persistence of nodules after resolution of the interstitial fluid.  Central airways: Patent  Effusions: Moderate left pleural effusion. No right pleural effusion.  Lymphadenopathy: There are a few scattered benign appearing axillary nodes. No hilar or mediastinal adenopathy is evident.  Esophagus: Unremarkable  Upper abdomen: Marked cirrhotic appearing irregularities of the liver. Splenic enlargement. Probable large varices around the distal esophagus and EG junction. Large volume ascites.  Musculoskeletal: No acute findings  Review of the MIP images confirms the above findings.  IMPRESSION: *Negative for pulmonary embolism. *Extensive interstitial thickening, likely pulmonary edema *Can not exclude a 13 mm right upper lobe nodule although it may merely represent more confluent interstitial abnormality. Follow-up CT recommended after resolution of the interstitial infiltrates/edema. *Moderately large left pleural effusion *Cirrhosis with portal hypertension and large volume peritoneal ascites   Electronically  Signed   By: Andreas Newport M.D.   On: 08/14/2014 22:00   Mr Jodene Nam Head Wo Contrast  08/15/2014   CLINICAL DATA:  Right foot weakness.  Confusion and slurred speech  EXAM: MRI HEAD WITHOUT CONTRAST  MRA HEAD WITHOUT CONTRAST  TECHNIQUE: Multiplanar, multiecho pulse sequences of the brain and surrounding structures were obtained without intravenous contrast. Angiographic images of the head were obtained using MRA technique without contrast.  COMPARISON:  CT head 08/14/2014  FINDINGS: MRI HEAD FINDINGS  Negative for acute infarct.  Mild chronic microvascular ischemic changes in the white matter. Mild chronic ischemia in the pons.  Ventricle size is normal. Cerebral volume is normal for age. Pituitary is normal in size.  Negative for intracranial hemorrhage or fluid collection.  Negative for mass or edema.  Mucosal edema in the sphenoid sinus with retained secretions.  MRA HEAD FINDINGS  Both vertebral arteries are patent to the basilar without stenosis. Left PICA patent. Right PICA not visualized. AICA, superior cerebellar, and posterior cerebral arteries are patent without significant stenosis  Internal carotid artery is widely patent bilaterally.  Hypoplastic right A1 segment, a normal variant. Both A2 segments are supplied from the left without stenosis. Middle cerebral arteries are patent bilaterally.  Negative for cerebral aneurysm.  IMPRESSION: Negative for acute infarct. Mild chronic microvascular ischemic change in the white matter and pons  Sphenoid sinusitis  Negative MRA head   Electronically Signed   By: Franchot Gallo M.D.   On: 08/15/2014 09:06   Mri Brain Without Contrast  08/15/2014   CLINICAL DATA:  Right foot weakness.  Confusion and slurred speech  EXAM: MRI HEAD WITHOUT CONTRAST  MRA HEAD WITHOUT CONTRAST  TECHNIQUE: Multiplanar, multiecho pulse sequences of the brain and surrounding structures were obtained without intravenous contrast. Angiographic images of the head were obtained using MRA  technique without contrast.  COMPARISON:  CT head 08/14/2014  FINDINGS: MRI HEAD FINDINGS  Negative for acute infarct.  Mild chronic microvascular ischemic changes in the white matter. Mild chronic ischemia in the pons.  Ventricle size is normal. Cerebral volume is normal for age. Pituitary is normal in size.  Negative for intracranial hemorrhage or fluid collection.  Negative for mass or edema.  Mucosal edema in the sphenoid sinus with retained secretions.  MRA HEAD FINDINGS  Both vertebral arteries are patent to the basilar without stenosis. Left PICA patent. Right PICA not visualized. AICA, superior cerebellar, and posterior cerebral arteries are patent without significant stenosis  Internal carotid artery is widely patent bilaterally. Hypoplastic right A1 segment, a normal variant. Both A2 segments are supplied from the left without stenosis. Middle cerebral arteries are patent bilaterally.  Negative for cerebral aneurysm.  IMPRESSION: Negative for acute infarct. Mild chronic microvascular ischemic change in the white matter and pons  Sphenoid sinusitis  Negative MRA head   Electronically Signed   By: Franchot Gallo M.D.   On: 08/15/2014 09:06   Dg Chest Port 1 View  08/14/2014   CLINICAL DATA:  Shortness of Breath, history of cirrhosis  EXAM: PORTABLE CHEST - 1 VIEW  COMPARISON:  10/17/2013  FINDINGS: Cardiomediastinal silhouette is stable. Mild interstitial prominence bilaterally without pulmonary edema. No segmental infiltrate.  IMPRESSION: Mild interstitial prominence bilaterally without convincing pulmonary edema. No segmental infiltrate.   Electronically Signed   By: Lahoma Crocker M.D.   On: 08/14/2014 17:54     PHYSICAL EXAM Middle aged Caucasian male not in distress. Has 2 + pedal edema bilateral. 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 Afebrile. Head is nontraumatic. Neck is supple without bruit. Hearing is normal. Cardiac exam no murmur or gallop. Lungs are clear to  auscultation. Distal pulses are well felt. Neurological Exam ;  Awake  Alert oriented x 3. Normal speech and language.eye movements full without nystagmus.fundi were not visualized. Vision acuity and fields appear normal. Hearing is normal. Palatal movements are normal. Face symmetric. Tongue midline. Normal strength, tone, reflexes and coordination. Normal sensation. Gait deferred. ASSESSMENT/PLAN Mr. Francis Doenges is a 64 y.o. male with history of mild diarrhea (after a 10-day course of Augmentin for cough productive of green sputum) and progressive SOB, DOE, swelling, and weakness. In ED he developed acute onset of right sided weakness and speech deficits. Code stroke was called . He did not receive IV t-PA due to platelet count of 75,000 and rapidly improving symptoms.   TIA    Resultant  Neuro deficits resolved  MRI  No acute stroke. Sphenoid sinusitis. small vessel disease.  MRA  Unremarkable   Carotid Doppler  pending   2D Echo  pending  SCDs for VTE prophylaxis  Diet Carb Modified thin liquids  no antithrombotic prior to admission, now on no antithrombotics. We recommend low dose aspirin. Will add.  Ongoing aggressive stroke risk factor management  Therapy recommendations:  pending   Disposition:  pending   Hyperlipidemia  Home meds:  lipitor 20, resumed in hospital  LDL 150, goal < 70  Continue statin at discharge  Diabetes type II  HgbA1c 6.0 in Dec 2015, atgoal < 7.0  Controlled  Other Stroke Risk Factors  Family hx stroke (paternal grandmother)  Other Active Problems  Cirrhosis of liver  Pulmonary edema  Hx gallstones  RUL nodule. For CT once edema resolved  Hospital day # [place] for Pager information 08/15/2014 2:17 PM  I have personally examined this patient, reviewed notes, independently viewed imaging studies, participated in medical decision making and plan of care. I have made any  additions or clarifications directly to the above note. Agree with note above. Suspect he had a TIA likely etiology being small vessel disease. He is at risk for recurrent TIAs and strokes. Recommend aspirin 81 mg given his cirrhosis and low platelet count. Brain imaging and neurological exam unremarkable. Continue ongoing stroke workup.  Antony Contras, MD Medical Director [facility] Pager: [phone] 08/15/2014 3:08 PM    To contact Stroke Continuity provider, please refer to [email or web address] After hours, contact General Neurology

## 2014-08-16 ENCOUNTER — Inpatient Hospital Stay (HOSPITAL_COMMUNITY): Payer: Medicare Other

## 2014-08-16 DIAGNOSIS — J329 Chronic sinusitis, unspecified: Secondary | ICD-10-CM | POA: Diagnosis present

## 2014-08-16 LAB — BODY FLUID CELL COUNT WITH DIFFERENTIAL
LYMPHS FL: 51 %
Monocyte-Macrophage-Serous Fluid: 46 % — ABNORMAL LOW (ref 50–90)
Neutrophil Count, Fluid: 3 % (ref 0–25)
WBC FLUID: 71 uL (ref 0–1000)

## 2014-08-16 LAB — URINALYSIS, ROUTINE W REFLEX MICROSCOPIC
Bilirubin Urine: NEGATIVE
GLUCOSE, UA: NEGATIVE mg/dL
HGB URINE DIPSTICK: NEGATIVE
Ketones, ur: NEGATIVE mg/dL
Leukocytes, UA: NEGATIVE
Nitrite: NEGATIVE
PROTEIN: NEGATIVE mg/dL
Specific Gravity, Urine: 1.011 (ref 1.005–1.030)
Urobilinogen, UA: 2 mg/dL — ABNORMAL HIGH (ref 0.0–1.0)
pH: 6.5 (ref 5.0–8.0)

## 2014-08-16 LAB — HEPATIC FUNCTION PANEL
ALBUMIN: 2.1 g/dL — AB (ref 3.5–5.2)
ALT: 25 U/L (ref 0–53)
AST: 53 U/L — AB (ref 0–37)
Alkaline Phosphatase: 94 U/L (ref 39–117)
BILIRUBIN TOTAL: 2.3 mg/dL — AB (ref 0.3–1.2)
Bilirubin, Direct: 0.7 mg/dL — ABNORMAL HIGH (ref 0.0–0.5)
Indirect Bilirubin: 1.6 mg/dL — ABNORMAL HIGH (ref 0.3–0.9)
Total Protein: 5.3 g/dL — ABNORMAL LOW (ref 6.0–8.3)

## 2014-08-16 LAB — GLUCOSE, CAPILLARY
GLUCOSE-CAPILLARY: 134 mg/dL — AB (ref 70–99)
Glucose-Capillary: 100 mg/dL — ABNORMAL HIGH (ref 70–99)
Glucose-Capillary: 137 mg/dL — ABNORMAL HIGH (ref 70–99)

## 2014-08-16 LAB — PROTEIN, BODY FLUID: Total protein, fluid: <3 g/dL

## 2014-08-16 LAB — ALBUMIN, FLUID (OTHER): Albumin, Fluid: <1 g/dL

## 2014-08-16 MED ORDER — SODIUM CHLORIDE 0.9 % IV BOLUS (SEPSIS)
250.0000 mL | Freq: Once | INTRAVENOUS | Status: AC
  Administered 2014-08-16: 250 mL via INTRAVENOUS

## 2014-08-16 MED ORDER — MORPHINE SULFATE 2 MG/ML IJ SOLN
1.0000 mg | INTRAMUSCULAR | Status: DC | PRN
  Administered 2014-08-16: 1 mg via INTRAVENOUS
  Filled 2014-08-16: qty 1

## 2014-08-16 MED ORDER — TRAMADOL HCL 50 MG PO TABS
50.0000 mg | ORAL_TABLET | Freq: Four times a day (QID) | ORAL | Status: DC | PRN
  Administered 2014-08-16: 50 mg via ORAL
  Filled 2014-08-16: qty 1

## 2014-08-16 MED ORDER — FUROSEMIDE 40 MG PO TABS
40.0000 mg | ORAL_TABLET | Freq: Every day | ORAL | Status: DC
  Administered 2014-08-16 – 2014-08-24 (×9): 40 mg via ORAL
  Filled 2014-08-16 (×9): qty 1

## 2014-08-16 MED ORDER — LIDOCAINE HCL (PF) 1 % IJ SOLN
INTRAMUSCULAR | Status: AC
  Filled 2014-08-16: qty 10

## 2014-08-16 MED ORDER — SPIRONOLACTONE 25 MG PO TABS
100.0000 mg | ORAL_TABLET | Freq: Every day | ORAL | Status: DC
  Administered 2014-08-16 – 2014-08-24 (×9): 100 mg via ORAL
  Filled 2014-08-16 (×11): qty 4

## 2014-08-16 NOTE — Progress Notes (Signed)
[facility] PROGRESS NOTE  Garrett Turner TOI:[id number] DOB: 10/12/1950 DOA: 08/14/2014 PCP: Lorayne Marek, MD  Brief narrative: I have seen and examined Garrett Turner at bedside and reviewed his chart. Appreciate neurology/GI. Garrett Turner is a very pleasant 64 y/o male with DM2, HTN, with NASH cirrhosis ( sees Dr Ethlyn Daniels) who presented to [facility] for dyspnea and leg swelling and developed rt sided weakness and speech impairment while in the ED therefore transferred from [facility] post admission for stroke w/up. Head CT negative. No TPA given due to low platelets. Stroke workup including brain MRI/carotid Doppler/2-D echocardiogram unrevealing. However, his blood pressure has been on the low side and this may have been the primary cause of the transient symptoms in the ED. Question is underlying cause which could include medications/infection. He had paracentesis done by Dr. Michail Sermon today and analysis is pending. Will also check UA. Will discontinue her losartan for now in view of low blood pressure but continue propranolol and diuretics per GI. Will also check ammonia level. Patient started on low-dose aspirin per neurology recommendations mindful of thrombocytopenia. Assessment/Plan: ?TIA  On ASA/ lipitor. Follow Neuro recommendations  Cirrhosis of liver with ? Acute decompensation Defer to GI. Hold losartan in view of low normal BP Check UA Follow ascitic fluid analysis. Check ammonia level  Pulmonary edema Appears euvolemic.    Hx of gallstones Patient seen by surgery and given underlying cirrhosis Have recommended to hold off on sx at this time.  DM type 2 last A1C of 6 on metformin as outpt. Monitor on SSI.  Rt upper lobe nodule 13 mm RUL nodule seen on CTA. Recommended follow up CT after resolution of interstitial infiltrate/ edema. Can be repeated in 4 -6 weeks as outpt  DVT prophylaxis: SCD  Diet: heart healthy    Code Status:full code Family Communication:  none at bedside Disposition Plan: pending completion of w/up . PT eval. home once improved and w/up completed.   Consultants:  Neuro  GI  Procedures:  MRI brain  2 d echo  Carotid Doppler  Antibiotics:  none    HPI/Subjective: Feels much better than he did when he came in. Denies shortness of breath.  Objective: Filed Vitals:   08/16/14 1555  BP: 101/60  Pulse: 66  Temp: 98.5 F (36.9 C)  Resp: 20    Intake/Output Summary (Last 24 hours) at 08/16/14 1752 Last data filed at 08/16/14 1539  Gross per 24 hour  Intake      0 ml  Output   1625 ml  Net  -1625 ml   Filed Weights   08/15/14 0316  Weight: 84.687 kg (186 lb 11.2 oz)    Exam:   General:  Sitting up eating.  Cardiovascular: RRR. S1-S2 normal.   Respiratory: Lungs clear  Abdomen: Soft and nontender. Slightly distended.  Musculoskeletal: Bilateral pitting pedal edema.   Data Reviewed: Basic Metabolic Panel:  Recent Labs Lab 08/14/14 1725 08/14/14 2223  NA 134* 137  K 3.5 3.8  CL 106 105  CO2 21  --   GLUCOSE 105* 98  BUN 15 14  CREATININE 0.88 0.90  CALCIUM 8.3*  --    Liver Function Tests:  Recent Labs Lab 08/14/14 1725  AST 75*  ALT 34  ALKPHOS 121*  BILITOT 3.4*  PROT 7.1  ALBUMIN 2.9*   No results for input(s): LIPASE, AMYLASE in the last 168 hours. No results for input(s): AMMONIA in the last 168 hours. CBC:  Recent Labs Lab 08/14/14 1725 08/14/14 2209  08/14/14 2223  WBC 4.8 3.7*  --   NEUTROABS  --  2.2  --   HGB 10.5* 10.2* 10.9*  HCT 31.7* 29.8* 32.0*  MCV 92.7 92.0  --   PLT 75* 48*  --    Cardiac Enzymes: No results for input(s): CKTOTAL, CKMB, CKMBINDEX, TROPONINI in the last 168 hours. BNP (last 3 results)  Recent Labs  10/17/13 1324  PROBNP 121.1   CBG:  Recent Labs Lab 08/15/14 1120 08/15/14 1704 08/15/14 2145 08/16/14 0632 08/16/14 1141  GLUCAP 149* 125* 143* 100* 137*    Recent Results (from the past 240 hour(s))  MRSA PCR  Screening     Status: None   Collection Time: 08/15/14  8:57 AM  Result Value Ref Range Status   MRSA by PCR NEGATIVE NEGATIVE Final    Comment:        The GeneXpert MRSA Assay (FDA approved for NASAL specimens only), is one component of a comprehensive MRSA colonization surveillance program. It is not intended to diagnose MRSA infection nor to guide or monitor treatment for MRSA infections.      Studies: Ct Head Wo Contrast  08/14/2014   CLINICAL DATA:  Cold stroke, syncope, new onset right-sided weakness  EXAM: CT HEAD WITHOUT CONTRAST  TECHNIQUE: Contiguous axial images were obtained from the base of the skull through the vertex without intravenous contrast.  COMPARISON:  06/18/2011  FINDINGS: No skull fracture is noted. No intracranial hemorrhage, mass effect or midline shift. No definite acute cortical infarction. No mass lesion is noted on this unenhanced scan. There is residual vascular enhancement from CT scan of the chest same day.  IMPRESSION: No intracranial hemorrhage, mass effect or midline shift. No definite acute cortical infarct. These results were called by telephone at the time of interpretation on 08/14/2014 at 10:09 pm to Dr. Dorie Rank , who verbally acknowledged these results.   Electronically Signed   By: Lahoma Crocker M.D.   On: 08/14/2014 22:09   Ct Angio Chest Pe W/cm &/or Wo Cm  08/14/2014   CLINICAL DATA:  Severe dyspnea with fatigue.  EXAM: CT ANGIOGRAPHY CHEST WITH CONTRAST  TECHNIQUE: Multidetector CT imaging of the chest was performed using the standard protocol during bolus administration of intravenous contrast. Multiplanar CT image reconstructions and MIPs were obtained to evaluate the vascular anatomy.  CONTRAST:  123mL OMNIPAQUE IOHEXOL 350 MG/ML SOLN  COMPARISON:  None.  FINDINGS: Cardiovascular: There is good opacification of the pulmonary arteries with no evidence of pulmonary embolism. The thoracic aorta is normal in caliber and intact.  Lungs: There is  extensive interstitial thickening. This probably represents interstitial edema but a component of interstitial fibrosis cannot be excluded. There is a 13 mm spiculated opacity in the right upper lobe laterally and there are a few additional scattered areas of ground-glass opacity in the periphery of both lungs. These could represent more confluent areas of interstitial abnormality but follow-up is necessary to exclude persistence of nodules after resolution of the interstitial fluid.  Central airways: Patent  Effusions: Moderate left pleural effusion. No right pleural effusion.  Lymphadenopathy: There are a few scattered benign appearing axillary nodes. No hilar or mediastinal adenopathy is evident.  Esophagus: Unremarkable  Upper abdomen: Marked cirrhotic appearing irregularities of the liver. Splenic enlargement. Probable large varices around the distal esophagus and EG junction. Large volume ascites.  Musculoskeletal: No acute findings  Review of the MIP images confirms the above findings.  IMPRESSION: *Negative for pulmonary embolism. *Extensive interstitial thickening, likely  pulmonary edema *Can not exclude a 13 mm right upper lobe nodule although it may merely represent more confluent interstitial abnormality. Follow-up CT recommended after resolution of the interstitial infiltrates/edema. *Moderately large left pleural effusion *Cirrhosis with portal hypertension and large volume peritoneal ascites   Electronically Signed   By: Andreas Newport M.D.   On: 08/14/2014 22:00   Mr Jodene Nam Head Wo Contrast  08/15/2014   CLINICAL DATA:  Right foot weakness.  Confusion and slurred speech  EXAM: MRI HEAD WITHOUT CONTRAST  MRA HEAD WITHOUT CONTRAST  TECHNIQUE: Multiplanar, multiecho pulse sequences of the brain and surrounding structures were obtained without intravenous contrast. Angiographic images of the head were obtained using MRA technique without contrast.  COMPARISON:  CT head 08/14/2014  FINDINGS: MRI HEAD  FINDINGS  Negative for acute infarct.  Mild chronic microvascular ischemic changes in the white matter. Mild chronic ischemia in the pons.  Ventricle size is normal. Cerebral volume is normal for age. Pituitary is normal in size.  Negative for intracranial hemorrhage or fluid collection.  Negative for mass or edema.  Mucosal edema in the sphenoid sinus with retained secretions.  MRA HEAD FINDINGS  Both vertebral arteries are patent to the basilar without stenosis. Left PICA patent. Right PICA not visualized. AICA, superior cerebellar, and posterior cerebral arteries are patent without significant stenosis  Internal carotid artery is widely patent bilaterally. Hypoplastic right A1 segment, a normal variant. Both A2 segments are supplied from the left without stenosis. Middle cerebral arteries are patent bilaterally.  Negative for cerebral aneurysm.  IMPRESSION: Negative for acute infarct. Mild chronic microvascular ischemic change in the white matter and pons  Sphenoid sinusitis  Negative MRA head   Electronically Signed   By: Franchot Gallo M.D.   On: 08/15/2014 09:06   Mri Brain Without Contrast  08/15/2014   CLINICAL DATA:  Right foot weakness.  Confusion and slurred speech  EXAM: MRI HEAD WITHOUT CONTRAST  MRA HEAD WITHOUT CONTRAST  TECHNIQUE: Multiplanar, multiecho pulse sequences of the brain and surrounding structures were obtained without intravenous contrast. Angiographic images of the head were obtained using MRA technique without contrast.  COMPARISON:  CT head 08/14/2014  FINDINGS: MRI HEAD FINDINGS  Negative for acute infarct.  Mild chronic microvascular ischemic changes in the white matter. Mild chronic ischemia in the pons.  Ventricle size is normal. Cerebral volume is normal for age. Pituitary is normal in size.  Negative for intracranial hemorrhage or fluid collection.  Negative for mass or edema.  Mucosal edema in the sphenoid sinus with retained secretions.  MRA HEAD FINDINGS  Both vertebral  arteries are patent to the basilar without stenosis. Left PICA patent. Right PICA not visualized. AICA, superior cerebellar, and posterior cerebral arteries are patent without significant stenosis  Internal carotid artery is widely patent bilaterally. Hypoplastic right A1 segment, a normal variant. Both A2 segments are supplied from the left without stenosis. Middle cerebral arteries are patent bilaterally.  Negative for cerebral aneurysm.  IMPRESSION: Negative for acute infarct. Mild chronic microvascular ischemic change in the white matter and pons  Sphenoid sinusitis  Negative MRA head   Electronically Signed   By: Franchot Gallo M.D.   On: 08/15/2014 09:06   US Paracentesis  08/16/2014   INDICATION: Ascites; cirrhosis  EXAM: ULTRASOUND-GUIDED PARACENTESIS  COMPARISON:  None.  MEDICATIONS: 10 cc 1% lidocaine  COMPLICATIONS: None immediate  TECHNIQUE: Informed written consent was obtained from the patient after a discussion of the risks, benefits and alternatives to treatment. A  timeout was performed prior to the initiation of the procedure.  Initial ultrasound scanning demonstrates a moderate amount of ascites within the right lower abdominal quadrant. The right lower abdomen was prepped and draped in the usual sterile fashion. 1% lidocaine with epinephrine was used for local anesthesia. Under direct ultrasound guidance, a 19 gauge, 7-cm, Yueh catheter was introduced. An ultrasound image was saved for documentation purposed. The paracentesis was performed. The catheter was removed and a dressing was applied. The patient tolerated the procedure well without immediate post procedural complication.  FINDINGS: A total of approximately 2 liters of yellow fluid was removed. Samples were sent to the laboratory as requested by the clinical team.  IMPRESSION: Successful ultrasound-guided paracentesis yielding 2 liters of peritoneal fluid.  2 liters maximum per MD.  Read by:  Lavonia Drafts [facility]   Electronically Signed    By: Daryll Brod M.D.   On: 08/16/2014 14:38    Scheduled Meds: . aspirin EC  81 mg Oral Daily  . atorvastatin  20 mg Oral Daily  . furosemide  40 mg Oral Daily  . insulin aspart  0-9 Units Subcutaneous TID WC  . lidocaine (PF)      . propranolol  10 mg Oral BID  . spironolactone  100 mg Oral Daily   Continuous Infusions:   Active Problems:   TIA (transient ischemic attack)   Acute pulmonary edema   Right sided weakness   Sinusitis, chronic    Time spent: 15 minutes.    Samanyu Tinnell  [facility] Pager [phone]. If 7PM-7AM, please contact night-coverage at [email or web address], password [facility] 08/16/2014, 5:52 PM  LOS: 2 days

## 2014-08-16 NOTE — Procedures (Signed)
  US guided RLQ para 2 liters yellow fluid Samples to lab per MD  Maximum 2 liters per MD

## 2014-08-16 NOTE — Progress Notes (Signed)
STROKE TEAM PROGRESS NOTE   HISTORY Garrett Turner is an 64 y.o. male who presented to ED with shortness of breath and lower extremity swelling. While in the ED noted to have acute onset of confusion, slurred speech and right sided weakness. Platelets noted to be 75,000 so not a tPA candidate. Per ED physician the symptoms rapidly improved, patient transferred to [facility] for further workup. LKW 08/14/2014 at 2200.  Patient describes an episode of confusion, where he could not understand people and could not get his words out. Also noted weakness of RUE and RLE. No prior history of this, no prior TIA or CVA. Notes chronic RUE distal>proximal weakness and numbness.   Patient was not administered TPA secondary to platelet count of 75,000 and rapidly improving symptoms. He was admitted for further evaluation and treatment.   SUBJECTIVE (INTERVAL HISTORY) Patient Sitting up in bed. No complaints.   OBJECTIVE Temp:  [94 F (34.4 C)-98.7 F (37.1 C)] 98.4 F (36.9 C) (01/28 1005) Pulse Rate:  [59-71] 66 (01/28 1005) Cardiac Rhythm:  [-] Sinus bradycardia (01/27 1954) Resp:  [16-20] 20 (01/28 1005) BP: (81-112)/(43-69) 104/64 mmHg (01/28 1005) SpO2:  [92 %-97 %] 97 % (01/28 1005)   Recent Labs Lab 08/14/14 2150 08/15/14 1120 08/15/14 1704 08/15/14 2145 08/16/14 0632  GLUCAP 90 149* 125* 143* 100*    Recent Labs Lab 08/14/14 1725 08/14/14 2223  NA 134* 137  K 3.5 3.8  CL 106 105  CO2 21  --   GLUCOSE 105* 98  BUN 15 14  CREATININE 0.88 0.90  CALCIUM 8.3*  --     Recent Labs Lab 08/14/14 1725  AST 75*  ALT 34  ALKPHOS 121*  BILITOT 3.4*  PROT 7.1  ALBUMIN 2.9*    Recent Labs Lab 08/14/14 1725 08/14/14 2209 08/14/14 2223  WBC 4.8 3.7*  --   NEUTROABS  --  2.2  --   HGB 10.5* 10.2* 10.9*  HCT 31.7* 29.8* 32.0*  MCV 92.7 92.0  --   PLT 75* 48*  --    No results for input(s): CKTOTAL, CKMB, CKMBINDEX, TROPONINI in the last 168 hours.  Recent Labs   08/14/14 1725  LABPROT 19.5*  INR 1.63*    Recent Labs  08/14/14 2337  COLORURINE YELLOW  LABSPEC 1.028  PHURINE 6.5  GLUCOSEU NEGATIVE  HGBUR NEGATIVE  BILIRUBINUR NEGATIVE  KETONESUR NEGATIVE  PROTEINUR NEGATIVE  UROBILINOGEN 1.0  NITRITE NEGATIVE  LEUKOCYTESUR NEGATIVE       Component Value Date/Time   CHOL 199 08/15/2014 0633   TRIG 121 08/15/2014 0633   HDL 25* 08/15/2014 0633   CHOLHDL 8.0 08/15/2014 0633   VLDL 24 08/15/2014 0633   LDLCALC 150* 08/15/2014 0633   Lab Results  Component Value Date   HGBA1C 6.0* 08/15/2014      Component Value Date/Time   LABOPIA NONE DETECTED 08/15/2014 0440   COCAINSCRNUR NONE DETECTED 08/15/2014 0440   LABBENZ NONE DETECTED 08/15/2014 0440   AMPHETMU NONE DETECTED 08/15/2014 0440   THCU NONE DETECTED 08/15/2014 0440   LABBARB NONE DETECTED 08/15/2014 0440     Recent Labs Lab 08/14/14 2209  ETH <5    Ct Head Wo Contrast  08/14/2014   CLINICAL DATA:  Cold stroke, syncope, new onset right-sided weakness  EXAM: CT HEAD WITHOUT CONTRAST  TECHNIQUE: Contiguous axial images were obtained from the base of the skull through the vertex without intravenous contrast.  COMPARISON:  06/18/2011  FINDINGS: No skull fracture is noted. No intracranial  hemorrhage, mass effect or midline shift. No definite acute cortical infarction. No mass lesion is noted on this unenhanced scan. There is residual vascular enhancement from CT scan of the chest same day.  IMPRESSION: No intracranial hemorrhage, mass effect or midline shift. No definite acute cortical infarct. These results were called by telephone at the time of interpretation on 08/14/2014 at 10:09 pm to Dr. Dorie Rank , who verbally acknowledged these results.   Electronically Signed   By: Lahoma Crocker M.D.   On: 08/14/2014 22:09   Ct Angio Chest Pe W/cm &/or Wo Cm  08/14/2014   CLINICAL DATA:  Severe dyspnea with fatigue.  EXAM: CT ANGIOGRAPHY CHEST WITH CONTRAST  TECHNIQUE: Multidetector CT  imaging of the chest was performed using the standard protocol during bolus administration of intravenous contrast. Multiplanar CT image reconstructions and MIPs were obtained to evaluate the vascular anatomy.  CONTRAST:  166mL OMNIPAQUE IOHEXOL 350 MG/ML SOLN  COMPARISON:  None.  FINDINGS: Cardiovascular: There is good opacification of the pulmonary arteries with no evidence of pulmonary embolism. The thoracic aorta is normal in caliber and intact.  Lungs: There is extensive interstitial thickening. This probably represents interstitial edema but a component of interstitial fibrosis cannot be excluded. There is a 13 mm spiculated opacity in the right upper lobe laterally and there are a few additional scattered areas of ground-glass opacity in the periphery of both lungs. These could represent more confluent areas of interstitial abnormality but follow-up is necessary to exclude persistence of nodules after resolution of the interstitial fluid.  Central airways: Patent  Effusions: Moderate left pleural effusion. No right pleural effusion.  Lymphadenopathy: There are a few scattered benign appearing axillary nodes. No hilar or mediastinal adenopathy is evident.  Esophagus: Unremarkable  Upper abdomen: Marked cirrhotic appearing irregularities of the liver. Splenic enlargement. Probable large varices around the distal esophagus and EG junction. Large volume ascites.  Musculoskeletal: No acute findings  Review of the MIP images confirms the above findings.  IMPRESSION: *Negative for pulmonary embolism. *Extensive interstitial thickening, likely pulmonary edema *Can not exclude a 13 mm right upper lobe nodule although it may merely represent more confluent interstitial abnormality. Follow-up CT recommended after resolution of the interstitial infiltrates/edema. *Moderately large left pleural effusion *Cirrhosis with portal hypertension and large volume peritoneal ascites   Electronically Signed   By: Andreas Newport  M.D.   On: 08/14/2014 22:00   Mr Jodene Nam Head Wo Contrast  08/15/2014   CLINICAL DATA:  Right foot weakness.  Confusion and slurred speech  EXAM: MRI HEAD WITHOUT CONTRAST  MRA HEAD WITHOUT CONTRAST  TECHNIQUE: Multiplanar, multiecho pulse sequences of the brain and surrounding structures were obtained without intravenous contrast. Angiographic images of the head were obtained using MRA technique without contrast.  COMPARISON:  CT head 08/14/2014  FINDINGS: MRI HEAD FINDINGS  Negative for acute infarct.  Mild chronic microvascular ischemic changes in the white matter. Mild chronic ischemia in the pons.  Ventricle size is normal. Cerebral volume is normal for age. Pituitary is normal in size.  Negative for intracranial hemorrhage or fluid collection.  Negative for mass or edema.  Mucosal edema in the sphenoid sinus with retained secretions.  MRA HEAD FINDINGS  Both vertebral arteries are patent to the basilar without stenosis. Left PICA patent. Right PICA not visualized. AICA, superior cerebellar, and posterior cerebral arteries are patent without significant stenosis  Internal carotid artery is widely patent bilaterally. Hypoplastic right A1 segment, a normal variant. Both A2 segments are supplied  from the left without stenosis. Middle cerebral arteries are patent bilaterally.  Negative for cerebral aneurysm.  IMPRESSION: Negative for acute infarct. Mild chronic microvascular ischemic change in the white matter and pons  Sphenoid sinusitis  Negative MRA head   Electronically Signed   By: Franchot Gallo M.D.   On: 08/15/2014 09:06   Mri Brain Without Contrast  08/15/2014   CLINICAL DATA:  Right foot weakness.  Confusion and slurred speech  EXAM: MRI HEAD WITHOUT CONTRAST  MRA HEAD WITHOUT CONTRAST  TECHNIQUE: Multiplanar, multiecho pulse sequences of the brain and surrounding structures were obtained without intravenous contrast. Angiographic images of the head were obtained using MRA technique without contrast.   COMPARISON:  CT head 08/14/2014  FINDINGS: MRI HEAD FINDINGS  Negative for acute infarct.  Mild chronic microvascular ischemic changes in the white matter. Mild chronic ischemia in the pons.  Ventricle size is normal. Cerebral volume is normal for age. Pituitary is normal in size.  Negative for intracranial hemorrhage or fluid collection.  Negative for mass or edema.  Mucosal edema in the sphenoid sinus with retained secretions.  MRA HEAD FINDINGS  Both vertebral arteries are patent to the basilar without stenosis. Left PICA patent. Right PICA not visualized. AICA, superior cerebellar, and posterior cerebral arteries are patent without significant stenosis  Internal carotid artery is widely patent bilaterally. Hypoplastic right A1 segment, a normal variant. Both A2 segments are supplied from the left without stenosis. Middle cerebral arteries are patent bilaterally.  Negative for cerebral aneurysm.  IMPRESSION: Negative for acute infarct. Mild chronic microvascular ischemic change in the white matter and pons  Sphenoid sinusitis  Negative MRA head   Electronically Signed   By: Franchot Gallo M.D.   On: 08/15/2014 09:06   Dg Chest Port 1 View  08/14/2014   CLINICAL DATA:  Shortness of Breath, history of cirrhosis  EXAM: PORTABLE CHEST - 1 VIEW  COMPARISON:  10/17/2013  FINDINGS: Cardiomediastinal silhouette is stable. Mild interstitial prominence bilaterally without pulmonary edema. No segmental infiltrate.  IMPRESSION: Mild interstitial prominence bilaterally without convincing pulmonary edema. No segmental infiltrate.   Electronically Signed   By: Lahoma Crocker M.D.   On: 08/14/2014 17:54   Carotid Doppler  There is 1-39% bilateral ICA stenosis. Vertebral artery flow is antegrade.    2D Echocardiogram  EF 60-65% with no source of embolus.   PHYSICAL EXAM Middle aged Caucasian male not in distress. Has 2 + pedal edema bilateral. 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 Afebrile. Head  is nontraumatic. Neck is supple without bruit. Hearing is normal. Cardiac exam no murmur or gallop. Lungs are clear to auscultation. Distal pulses are well felt. Neurological Exam ;  Awake  Alert oriented x 3. Normal speech and language.eye movements full without nystagmus.fundi were not visualized. Vision acuity and fields appear normal. Hearing is normal. Palatal movements are normal. Face symmetric. Tongue midline. Normal strength, tone, reflexes and coordination. Normal sensation. Gait deferred.   ASSESSMENT/PLAN Garrett Turner is a 64 y.o. male with history of mild diarrhea (after a 10-day course of Augmentin for cough productive of Garrett sputum) and progressive SOB, DOE, swelling, and weakness. In ED he developed acute onset of right sided weakness and speech deficits. Code stroke was called . He did not receive IV t-PA due to platelet count of 75,000 and rapidly improving symptoms.   TIA    Resultant  Neuro deficits resolved  MRI  No acute stroke. Sphenoid sinusitis. small vessel disease.  MRA  Unremarkable   Carotid Doppler  No significant stenosis   2D Echo  No source of embolus   SCDs for VTE prophylaxis  Diet Carb Modified thin liquids  no antithrombotic prior to admission, added  aspirin 81 mg orally every day  Ongoing aggressive stroke risk factor management  Therapy recommendations:  pending   Disposition:  pending   Hyperlipidemia  Home meds:  lipitor 20, resumed in hospital  LDL 150, goal < 70  Continue statin at discharge  Diabetes type II  HgbA1c 6.0 in Dec 2015, atgoal < 7.0  Controlled  Other Stroke Risk Factors  Family hx stroke (paternal grandmother)  Other Active Problems  Cirrhosis of liver  Pulmonary edema  Hx gallstones  RUL nodule. For CT once edema resolved  NO FURTHER STROKE WORKUP INDICATED  Patient has a 10-15% risk of having another stroke over the next year, the highest risk is within 2 weeks of the most recent  stroke/TIA (risk of having a stroke following a stroke or TIA is the same).  Ongoing risk factor control by Primary Care Physician  Stroke Service will sign off. Please call should any needs arise.  Follow up with Dr. Antony Contras, stroke clinic at [facility] in 1 month, order placed.  Hospital day # [place] for Pager information 08/16/2014 11:16 AM  I have personally examined this patient, reviewed notes, independently viewed imaging studies, participated in medical decision making and plan of care. I have made any additions or clarifications directly to the above note. Agree with note above.   Antony Contras, MD Medical Director [facility] Pager: [phone] 08/16/2014 9:59 PM    To contact Stroke Continuity provider, please refer to [email or web address] After hours, contact General Neurology

## 2014-08-16 NOTE — Consult Note (Addendum)
Referring Provider: Dr. Clementeen Graham Primary Care Physician:  Lorayne Marek, MD Primary Gastroenterologist:  Dr. Paulita Fujita  Reason for Consultation:  Cirrhosis  HPI: Garrett Turner is a 64 y.o. 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 He is thought to have had a TIA. Hep C negative and reported history of Hep B vaccination. Reported previous Hep A exposure. Denies abdominal pain, melena, or hematochezia. States that he has not been following a low salt diet at home. Drinks alcohol occasionally and denies history of alcohol abuse. Grade II esophageal varices on EGD in 2013 and on propranolol for that. Benign polyps noted on colonoscopy at that time. Sigmoid diverticulosis noted as well. Reports abdominal pain today in his lower abdomen. LE edema has been occurring prior to admit for several weeks. MELD score of 12.   Past Medical History  Diagnosis Date  . Diabetes mellitus   . Hypertension   . High cholesterol   . Anxiety disorder   . Pancytopenia   . Inguinal hernia     Left  . Cirrhosis   . UTI (lower urinary tract infection)   . Depression   . Anxiety   . Leg cramps     Past Surgical History  Procedure Laterality Date  . Tonsillectomy    . Esophagogastroduodenoscopy  11/04/2011    Procedure: ESOPHAGOGASTRODUODENOSCOPY (EGD);  Surgeon: Arta Silence, MD;  Location: [facility];  Service: Endoscopy;  Laterality: N/A;  . Colonoscopy  11/04/2011    Procedure: COLONOSCOPY;  Surgeon: Arta Silence, MD;  Location: [facility];  Service: Endoscopy;  Laterality: N/A;  . Vasectomy  1985    Prior to Admission medications   Medication Sig Start Date End Date Taking? Authorizing Provider  atorvastatin (LIPITOR) 20 MG tablet Take 1 tablet (20 mg total) by mouth daily. 06/19/14  Yes Lorayne Marek, MD  losartan-hydrochlorothiazide (HYZAAR) 50-12.5 MG per tablet Take 1 tablet by  mouth daily. 06/19/14  Yes Lorayne Marek, MD  metFORMIN (GLUCOPHAGE) 1000 MG tablet Take 1 tablet (1,000 mg total) by mouth 2 (two) times daily with a meal. 06/19/14  Yes Deepak Advani, MD  propranolol (INDERAL) 10 MG tablet Take 1 tablet (10 mg total) by mouth 2 (two) times daily. 06/19/14  Yes Lorayne Marek, MD  traZODone (DESYREL) 50 MG tablet Take 1 tablet (50 mg total) by mouth at bedtime as needed for sleep. 06/19/14  Yes Lorayne Marek, MD  amoxicillin-clavulanate (AUGMENTIN) 875-125 MG per tablet Take 1 tablet by mouth 2 (two) times daily. Patient not taking: Reported on 08/14/2014 07/26/14   Tresa Garter, MD    Scheduled Meds: . aspirin EC  81 mg Oral Daily  . atorvastatin  20 mg Oral Daily  . hydrochlorothiazide  12.5 mg Oral Daily  . insulin aspart  0-9 Units Subcutaneous TID WC  . losartan  50 mg Oral Daily  . propranolol  10 mg Oral BID   Continuous Infusions:  PRN Meds:.morphine injection, traMADol, traZODone  Allergies as of 08/14/2014  . (No Known Allergies)    Family History  Problem Relation Age of Onset  . Coronary artery disease    . Hypertension    . Heart failure Father   . Hypertension Father   . Heart disease Father   . Heart failure Mother   . Hypertension Mother   . Heart disease Mother   . Stroke Paternal Grandmother   . Obesity Daughter  History   Social History  . Marital Status: Married    Spouse Name: N/A    Number of Children: N/A  . Years of Education: N/A   Occupational History  . Not on file.   Social History Main Topics  . Smoking status: Never Smoker   . Smokeless tobacco: Former Systems developer  . Alcohol Use: 1.1 oz/week    1 Glasses of wine, 1 Not specified per week  . Drug Use: No  . Sexual Activity: Not Currently   Other Topics Concern  . Not on file   Social History Narrative    Review of Systems: All negative except as stated above in HPI.  Physical Exam: Vital signs: Filed Vitals:   08/16/14 1005  BP: 104/64   Pulse: 66  Temp: 98.4 F (36.9 C)  Resp: 20   Last BM Date: 08/14/14 General:   Lethargic, thin, pleasant and cooperative in NAD HEENT: anicteric Lungs:  Clear throughout to auscultation.   No wheezes, crackles, or rhonchi. No acute distress. Heart:  Regular rate and rhythm; no murmurs, clicks, rubs,  or gallops. Abdomen: right-sided tenderness with guarding, soft, mildly distended, +BS  Rectal:  Deferred Ext: 2+ - 3+ pitting LE edema Neuro: no asterixis; oriented X 3  GI:  Lab Results:  Recent Labs  08/14/14 1725 08/14/14 2209 08/14/14 2223  WBC 4.8 3.7*  --   HGB 10.5* 10.2* 10.9*  HCT 31.7* 29.8* 32.0*  PLT 75* 48*  --    BMET  Recent Labs  08/14/14 1725 08/14/14 2223  NA 134* 137  K 3.5 3.8  CL 106 105  CO2 21  --   GLUCOSE 105* 98  BUN 15 14  CREATININE 0.88 0.90  CALCIUM 8.3*  --    LFT  Recent Labs  08/14/14 1725  PROT 7.1  ALBUMIN 2.9*  AST 75*  ALT 34  ALKPHOS 121*  BILITOT 3.4*  BILIDIR 1.0*  IBILI 2.4*   PT/INR  Recent Labs  08/14/14 1725  LABPROT 19.5*  INR 1.63*     Studies/Results: Ct Head Wo Contrast  08/14/2014   CLINICAL DATA:  Cold stroke, syncope, new onset right-sided weakness  EXAM: CT HEAD WITHOUT CONTRAST  TECHNIQUE: Contiguous axial images were obtained from the base of the skull through the vertex without intravenous contrast.  COMPARISON:  06/18/2011  FINDINGS: No skull fracture is noted. No intracranial hemorrhage, mass effect or midline shift. No definite acute cortical infarction. No mass lesion is noted on this unenhanced scan. There is residual vascular enhancement from CT scan of the chest same day.  IMPRESSION: No intracranial hemorrhage, mass effect or midline shift. No definite acute cortical infarct. These results were called by telephone at the time of interpretation on 08/14/2014 at 10:09 pm to Dr. Dorie Rank , who verbally acknowledged these results.   Electronically Signed   By: Lahoma Crocker M.D.   On:  08/14/2014 22:09   Ct Angio Chest Pe W/cm &/or Wo Cm  08/14/2014   CLINICAL DATA:  Severe dyspnea with fatigue.  EXAM: CT ANGIOGRAPHY CHEST WITH CONTRAST  TECHNIQUE: Multidetector CT imaging of the chest was performed using the standard protocol during bolus administration of intravenous contrast. Multiplanar CT image reconstructions and MIPs were obtained to evaluate the vascular anatomy.  CONTRAST:  167mL OMNIPAQUE IOHEXOL 350 MG/ML SOLN  COMPARISON:  None.  FINDINGS: Cardiovascular: There is good opacification of the pulmonary arteries with no evidence of pulmonary embolism. The thoracic aorta is normal in caliber and  intact.  Lungs: There is extensive interstitial thickening. This probably represents interstitial edema but a component of interstitial fibrosis cannot be excluded. There is a 13 mm spiculated opacity in the right upper lobe laterally and there are a few additional scattered areas of ground-glass opacity in the periphery of both lungs. These could represent more confluent areas of interstitial abnormality but follow-up is necessary to exclude persistence of nodules after resolution of the interstitial fluid.  Central airways: Patent  Effusions: Moderate left pleural effusion. No right pleural effusion.  Lymphadenopathy: There are a few scattered benign appearing axillary nodes. No hilar or mediastinal adenopathy is evident.  Esophagus: Unremarkable  Upper abdomen: Marked cirrhotic appearing irregularities of the liver. Splenic enlargement. Probable large varices around the distal esophagus and EG junction. Large volume ascites.  Musculoskeletal: No acute findings  Review of the MIP images confirms the above findings.  IMPRESSION: *Negative for pulmonary embolism. *Extensive interstitial thickening, likely pulmonary edema *Can not exclude a 13 mm right upper lobe nodule although it may merely represent more confluent interstitial abnormality. Follow-up CT recommended after resolution of the  interstitial infiltrates/edema. *Moderately large left pleural effusion *Cirrhosis with portal hypertension and large volume peritoneal ascites   Electronically Signed   By: Andreas Newport M.D.   On: 08/14/2014 22:00   Mr Jodene Nam Head Wo Contrast  08/15/2014   CLINICAL DATA:  Right foot weakness.  Confusion and slurred speech  EXAM: MRI HEAD WITHOUT CONTRAST  MRA HEAD WITHOUT CONTRAST  TECHNIQUE: Multiplanar, multiecho pulse sequences of the brain and surrounding structures were obtained without intravenous contrast. Angiographic images of the head were obtained using MRA technique without contrast.  COMPARISON:  CT head 08/14/2014  FINDINGS: MRI HEAD FINDINGS  Negative for acute infarct.  Mild chronic microvascular ischemic changes in the white matter. Mild chronic ischemia in the pons.  Ventricle size is normal. Cerebral volume is normal for age. Pituitary is normal in size.  Negative for intracranial hemorrhage or fluid collection.  Negative for mass or edema.  Mucosal edema in the sphenoid sinus with retained secretions.  MRA HEAD FINDINGS  Both vertebral arteries are patent to the basilar without stenosis. Left PICA patent. Right PICA not visualized. AICA, superior cerebellar, and posterior cerebral arteries are patent without significant stenosis  Internal carotid artery is widely patent bilaterally. Hypoplastic right A1 segment, a normal variant. Both A2 segments are supplied from the left without stenosis. Middle cerebral arteries are patent bilaterally.  Negative for cerebral aneurysm.  IMPRESSION: Negative for acute infarct. Mild chronic microvascular ischemic change in the white matter and pons  Sphenoid sinusitis  Negative MRA head   Electronically Signed   By: Franchot Gallo M.D.   On: 08/15/2014 09:06   Mri Brain Without Contrast  08/15/2014   CLINICAL DATA:  Right foot weakness.  Confusion and slurred speech  EXAM: MRI HEAD WITHOUT CONTRAST  MRA HEAD WITHOUT CONTRAST  TECHNIQUE: Multiplanar,  multiecho pulse sequences of the brain and surrounding structures were obtained without intravenous contrast. Angiographic images of the head were obtained using MRA technique without contrast.  COMPARISON:  CT head 08/14/2014  FINDINGS: MRI HEAD FINDINGS  Negative for acute infarct.  Mild chronic microvascular ischemic changes in the white matter. Mild chronic ischemia in the pons.  Ventricle size is normal. Cerebral volume is normal for age. Pituitary is normal in size.  Negative for intracranial hemorrhage or fluid collection.  Negative for mass or edema.  Mucosal edema in the sphenoid sinus with retained secretions.  MRA HEAD FINDINGS  Both vertebral arteries are patent to the basilar without stenosis. Left PICA patent. Right PICA not visualized. AICA, superior cerebellar, and posterior cerebral arteries are patent without significant stenosis  Internal carotid artery is widely patent bilaterally. Hypoplastic right A1 segment, a normal variant. Both A2 segments are supplied from the left without stenosis. Middle cerebral arteries are patent bilaterally.  Negative for cerebral aneurysm.  IMPRESSION: Negative for acute infarct. Mild chronic microvascular ischemic change in the white matter and pons  Sphenoid sinusitis  Negative MRA head   Electronically Signed   By: Franchot Gallo M.D.   On: 08/15/2014 09:06   Dg Chest Port 1 View  08/14/2014   CLINICAL DATA:  Shortness of Breath, history of cirrhosis  EXAM: PORTABLE CHEST - 1 VIEW  COMPARISON:  10/17/2013  FINDINGS: Cardiomediastinal silhouette is stable. Mild interstitial prominence bilaterally without pulmonary edema. No segmental infiltrate.  IMPRESSION: Mild interstitial prominence bilaterally without convincing pulmonary edema. No segmental infiltrate.   Electronically Signed   By: Lahoma Crocker M.D.   On: 08/14/2014 17:54    Impression/Plan: 64 yo with NASH cirrhosis with volume overload now having abdominal pain. Concern for TIA per neurology workup.  Needs diuretic therapy (Lasix 40 mg/day and Aldactone 100mg /day) now and as outpt. U/S guided paracentesis to check for SBP. Check AFP. Low sodium diet.    LOS: 2 days   Wayne C.  08/16/2014, 12:11 PM

## 2014-08-17 ENCOUNTER — Inpatient Hospital Stay (HOSPITAL_COMMUNITY): Payer: Medicare Other

## 2014-08-17 LAB — COMPREHENSIVE METABOLIC PANEL
ALT: 25 U/L (ref 0–53)
ANION GAP: 4 — AB (ref 5–15)
AST: 55 U/L — AB (ref 0–37)
Albumin: 2.2 g/dL — ABNORMAL LOW (ref 3.5–5.2)
Alkaline Phosphatase: 99 U/L (ref 39–117)
BUN: 9 mg/dL (ref 6–23)
CALCIUM: 8.1 mg/dL — AB (ref 8.4–10.5)
CO2: 27 mmol/L (ref 19–32)
CREATININE: 0.94 mg/dL (ref 0.50–1.35)
Chloride: 105 mmol/L (ref 96–112)
GFR calc Af Amer: >90 mL/min (ref >90)
GFR calc non Af Amer: 86 mL/min — ABNORMAL LOW (ref >90)
GLUCOSE: 115 mg/dL — AB (ref 70–99)
Potassium: 3.8 mmol/L (ref 3.5–5.1)
SODIUM: 136 mmol/L (ref 135–145)
Total Bilirubin: 2.3 mg/dL — ABNORMAL HIGH (ref 0.3–1.2)
Total Protein: 6 g/dL (ref 6.0–8.3)

## 2014-08-17 LAB — GLUCOSE, CAPILLARY
GLUCOSE-CAPILLARY: 129 mg/dL — AB (ref 70–99)
Glucose-Capillary: 158 mg/dL — ABNORMAL HIGH (ref 70–99)
Glucose-Capillary: 114 mg/dL — ABNORMAL HIGH (ref 70–99)
Glucose-Capillary: 99 mg/dL (ref 70–99)
Glucose-Capillary: 110 mg/dL — ABNORMAL HIGH (ref 70–99)
Glucose-Capillary: 133 mg/dL — ABNORMAL HIGH (ref 70–99)

## 2014-08-17 LAB — CBC
HEMATOCRIT: 27.7 % — AB (ref 39.0–52.0)
HEMOGLOBIN: 9.5 g/dL — AB (ref 13.0–17.0)
MCH: 30.9 pg (ref 26.0–34.0)
MCHC: 34.3 g/dL (ref 30.0–36.0)
MCV: 90.2 fL (ref 78.0–100.0)
Platelets: 42 10*3/uL — ABNORMAL LOW (ref 150–400)
RBC: 3.07 MIL/uL — AB (ref 4.22–5.81)
RDW: 17 % — AB (ref 11.5–15.5)
WBC: 2.6 10*3/uL — AB (ref 4.0–10.5)

## 2014-08-17 LAB — AFP TUMOR MARKER: AFP-Tumor Marker: 1.9 ng/mL (ref <6.1)

## 2014-08-17 LAB — URINE CULTURE
COLONY COUNT: NO GROWTH
CULTURE: NO GROWTH

## 2014-08-17 LAB — AMMONIA: AMMONIA: 27 umol/L (ref 11–32)

## 2014-08-17 LAB — MAGNESIUM: Magnesium: 1.6 mg/dL (ref 1.5–2.5)

## 2014-08-17 NOTE — Evaluation (Signed)
Physical Therapy Evaluation Patient Details Name: Marciano Mundt MRN: [id number] DOB: [birth year] Today's Date: 08/17/2014   History of Present Illness  Patient is a 64 y/o male who came to [facility] ED 08/15/2014 after being referred by his gastroenterologist reporting mild diarrhea (after a 10-day course of Augmentin for cough productive of green sputum) and progressive SOB, DOE, swelling, and weakness.  The patient was sent to the ER for eval of the SOB and developed acute onset of right sided weakness and speech deficits after returning from CT scan of his chest. Code Stroke initiated and pt transferred to [facility]. CT and MRI head- unremarkable.  s/p paracentesis 1/28 2L.     Clinical Impression  Patient presents with functional limitations due to deficits listed in PT problem list (see below). Pt with generalized weakness, impaired endurance, dyspnea on exertion and balance deficits impacting safe mobility. Pt fatigues quickly during gait requiring frequent rest breaks with drop on Sa02 on RA. Pt not safe to return home at this time. Pt would benefit from skilled PT and SNF to improve transfers, gait, balance and endurance so pt can maximize independence and return to PLOF.    Follow Up Recommendations SNF;Supervision/Assistance - 24 hour    Equipment Recommendations  None recommended by PT    Recommendations for Other Services OT consult     Precautions / Restrictions Precautions Precautions: Fall Precaution Comments: monitor 02 Restrictions Weight Bearing Restrictions: No      Mobility  Bed Mobility Overal bed mobility: Needs Assistance Bed Mobility: Supine to Sit     Supine to sit: Supervision;HOB elevated     General bed mobility comments: Supervision for safety.  Transfers Overall transfer level: Needs assistance Equipment used: Rolling walker (2 wheeled) Transfers: Sit to/from Stand Sit to Stand: Min guard         General transfer comment: Min guard for safety. Unsteady  initially upon standing.  + dizziness.  Ambulation/Gait Ambulation/Gait assistance: Min assist Ambulation Distance (Feet): 40 Feet Assistive device: Rolling walker (2 wheeled) Gait Pattern/deviations: Step-through pattern;Decreased stride length;Trunk flexed   Gait velocity interpretation: Below normal speed for age/gender General Gait Details: Pt with slow, unsteady gait with decreased knee stability bilaterally with multiple short rest breaks due to dyspnea. Sa02 88% on RA. Cues for pursed lip breathing however pt unable reporting chest tightness.  Stairs            Wheelchair Mobility    Modified Rankin (Stroke Patients Only)       Balance Overall balance assessment: Needs assistance Sitting-balance support: Feet supported;No upper extremity supported Sitting balance-Leahy Scale: Fair Sitting balance - Comments: Able to reach outside BoS and donn socks wtihout difficulty.    Standing balance support: During functional activity Standing balance-Leahy Scale: Fair                               Pertinent Vitals/Pain Pain Assessment: No/denies pain    Home Living Family/patient expects to be discharged to:: Private residence Living Arrangements: Alone   Type of Home: House Home Access: Level entry     Home Layout: One level Home Equipment: Environmental consultant - 2 wheels      Prior Function Level of Independence: Independent               Hand Dominance        Extremity/Trunk Assessment   Upper Extremity Assessment: Defer to OT evaluation  Lower Extremity Assessment: RLE deficits/detail;LLE deficits/detail;Generalized weakness RLE Deficits / Details:  Pitting edema right foot 2+ with pain. Knee instability during gait. LLE Deficits / Details: Pitting edema left foot 1+ with pain.  Knee instability during gait.      Communication   Communication: No difficulties  Cognition Arousal/Alertness: Awake/alert Behavior During Therapy: WFL  for tasks assessed/performed Overall Cognitive Status: Within Functional Limits for tasks assessed                      General Comments      Exercises        Assessment/Plan    PT Assessment Patient needs continued PT services  PT Diagnosis Difficulty walking;Generalized weakness   PT Problem List Decreased strength;Cardiopulmonary status limiting activity;Impaired sensation;Decreased activity tolerance;Decreased balance;Decreased mobility;Decreased safety awareness  PT Treatment Interventions Balance training;Gait training;Patient/family education;Functional mobility training;Therapeutic activities;Therapeutic exercise   PT Goals (Current goals can be found in the Care Plan section) Acute Rehab PT Goals Patient Stated Goal: to get stronger and return home PT Goal Formulation: With patient Time For Goal Achievement: 08/31/14 Potential to Achieve Goals: Fair    Frequency Min 3X/week   Barriers to discharge Decreased caregiver support Pt lives alone    Co-evaluation               End of Session Equipment Utilized During Treatment: Gait belt Activity Tolerance: Patient limited by fatigue Patient left: in chair;with call bell/phone within reach           Time: 1505-1532 PT Time Calculation (min) (ACUTE ONLY): 27 min   Charges:   PT Evaluation $Initial PT Evaluation Tier I: 1 Procedure PT Treatments $Gait Training: 8-22 mins   PT G CodesCandy Sledge A 2014-08-19, 3:50 PM Candy Sledge, [place], DPT [phone]

## 2014-08-17 NOTE — Progress Notes (Signed)
Md notified of rash to buttocks and scrotum. Instructed to monitor. No new orders.

## 2014-08-17 NOTE — Care Management Note (Signed)
    Page 1 of 1   08/23/2014     2:44:43 PM CARE MANAGEMENT NOTE 08/23/2014  Patient:  Garrett Turner, Garrett Turner   Account Number:  [id number]  Date Initiated:  08/15/2014  Documentation initiated by:  Lorne Skeens  Subjective/Objective Assessment:   Patient was admitted with weakness, SOB, cirrhosis of the liver.  Lives at home alone.     Action/Plan:   Will follow for discharge needs pending PT/OT evals and physician ordres.   Anticipated DC Date:  08/24/2014   Anticipated DC Plan:  SKILLED NURSING FACILITY  In-house referral  Clinical Social Worker         Choice offered to / List presented to:             Status of service:  Completed, signed off Medicare Important Message given?  YES (If response is "NO", the following Medicare IM given date fields will be blank) Date Medicare IM given:  08/17/2014 Medicare IM given by:  Lorne Skeens Date Additional Medicare IM given:  08/23/2014 Additional Medicare IM given by:  Lorne Skeens  Discharge Disposition:    Per UR Regulation:  Reviewed for med. necessity/level of care/duration of stay  If discussed at [place] of Stay Meetings, dates discussed:    Comments:  08/23/14 [place], MSN, CM- Additional Medicare IM letter provided.   08/20/14 [place], MSN, CM- Additional Medicare IM letter provided.   08/17/14 [place], MSN, CM- Medicare IM letter provided.

## 2014-08-17 NOTE — Progress Notes (Signed)
[facility] PROGRESS NOTE  Garrett Turner YJE:[id number] DOB: [birth year] DOA: 08/14/2014 PCP: Lorayne Marek, MD  Summary Appreciate neurology/GI. Garrett Turner is a very pleasant 64 y/o male with DM2, HTN, with NASH cirrhosis ( sees Dr Ethlyn Daniels) who presented to [facility] for dyspnea and leg swelling and developed rt sided weakness and speech impairment while in the ED therefore transferred from [facility] post admission for stroke w/up. Head CT negative. No TPA given due to low platelets. Stroke workup including brain MRI/carotid Doppler/2-D echocardiogram unrevealing. However, his blood pressure has been on the low side and this may have been the primary cause of the transient symptoms in the ED. Question is underlying cause which could include medications/infection. He had paracentesis done by Dr. Michail Sermon and this did not suggest SBP. UA unremarkable . I discontinued Losartan for now in view of low blood pressure but continued propranolol and diuretics per GI.  Ammonia level is normal. Patient started on low-dose aspirin per neurology recommendations mindful of thrombocytopenia. Patient reports that he got short of breath while ambulating with physical therapy assistance. He says his oxygen saturation dropped to 88%. Physical therapy has recommended short-term rehabilitation placement. Patient open to it. Since patient continues to have shortness of breath with desaturation, despite diuresis, will obtain follow-up CT chest, if evidence of infection would cover for resistant organisms. Plan ?TIA  On ASA/ lipitor. Follow Neuro recommendations  Cirrhosis of liver with ? Acute decompensation Defer to GI recommendations. Hold losartan in view of low normal BP  Pulmonary edema Repeat CT chest    Hx of gallstones Patient seen by surgery and given underlying cirrhosis Have recommended to hold off on sx at this time.  DM type 2 last A1C of 6 on metformin as outpt. Monitor on SSI.  Rt upper lobe  nodule 13 mm RUL nodule seen on CTA. Recommended follow up CT after resolution of interstitial infiltrate/ edema. Can be repeated in 4 -6 weeks as outpt  DVT prophylaxis: SCD  Diet: heart healthy    Code Status:full code Family Communication: none at bedside Disposition Plan: For short-term rehabilitation..   Consultants:  Neuro  GI  Procedures:  MRI brain  2 d echo  Carotid Doppler  Antibiotics:  none  HPI/Subjective: Was dyspneic earlier but feels better.  Objective: Filed Vitals:   08/17/14 1330  BP: 102/60  Pulse: 62  Temp: 98.7 F (37.1 C)  Resp: 18    Intake/Output Summary (Last 24 hours) at 08/17/14 1741 Last data filed at 08/17/14 1300  Gross per 24 hour  Intake    240 ml  Output   1600 ml  Net  -1360 ml   Filed Weights   08/15/14 0316  Weight: 84.687 kg (186 lb 11.2 oz)    Exam:   General:  Sitting up eating  Cardiovascular: RRR. S1-S2 normal. No murmurs.  Respiratory: Good air entry bilaterally. No wheezing. No rhonchi.  Abdomen: Soft, nontender. Normal bowel sounds.  Musculoskeletal: Bilateral pitting pedal edema.   Data Reviewed: Basic Metabolic Panel:  Recent Labs Lab 08/14/14 1725 08/14/14 2223 08/17/14 0500  NA 134* 137 136  K 3.5 3.8 3.8  CL 106 105 105  CO2 21  --  27  GLUCOSE 105* 98 115*  BUN 15 14 9   CREATININE 0.88 0.90 0.94  CALCIUM 8.3*  --  8.1*  MG  --   --  1.6   Liver Function Tests:  Recent Labs Lab 08/14/14 1725 08/16/14 1707 08/17/14 0500  AST 75* 53* 55*  ALT 34 25 25  ALKPHOS 121* 94 99  BILITOT 3.4* 2.3* 2.3*  PROT 7.1 5.3* 6.0  ALBUMIN 2.9* 2.1* 2.2*   No results for input(s): LIPASE, AMYLASE in the last 168 hours.  Recent Labs Lab 08/17/14 0500  AMMONIA 27   CBC:  Recent Labs Lab 08/14/14 1725 08/14/14 2209 08/14/14 2223 08/17/14 0500  WBC 4.8 3.7*  --  2.6*  NEUTROABS  --  2.2  --   --   HGB 10.5* 10.2* 10.9* 9.5*  HCT 31.7* 29.8* 32.0* 27.7*  MCV 92.7 92.0   --  90.2  PLT 75* 48*  --  42*   Cardiac Enzymes: No results for input(s): CKTOTAL, CKMB, CKMBINDEX, TROPONINI in the last 168 hours. BNP (last 3 results)  Recent Labs  10/17/13 1324  PROBNP 121.1   CBG:  Recent Labs Lab 08/16/14 1655 08/16/14 2158 08/17/14 0635 08/17/14 1153 08/17/14 1656  GLUCAP 114* 134* 99 129* 110*    Recent Results (from the past 240 hour(s))  MRSA PCR Screening     Status: None   Collection Time: 08/15/14  8:57 AM  Result Value Ref Range Status   MRSA by PCR NEGATIVE NEGATIVE Final    Comment:        The GeneXpert MRSA Assay (FDA approved for NASAL specimens only), is one component of a comprehensive MRSA colonization surveillance program. It is not intended to diagnose MRSA infection nor to guide or monitor treatment for MRSA infections.      Studies: US Paracentesis  08/16/2014   INDICATION: Ascites; cirrhosis  EXAM: ULTRASOUND-GUIDED PARACENTESIS  COMPARISON:  None.  MEDICATIONS: 10 cc 1% lidocaine  COMPLICATIONS: None immediate  TECHNIQUE: Informed written consent was obtained from the patient after a discussion of the risks, benefits and alternatives to treatment. A timeout was performed prior to the initiation of the procedure.  Initial ultrasound scanning demonstrates a moderate amount of ascites within the right lower abdominal quadrant. The right lower abdomen was prepped and draped in the usual sterile fashion. 1% lidocaine with epinephrine was used for local anesthesia. Under direct ultrasound guidance, a 19 gauge, 7-cm, Yueh catheter was introduced. An ultrasound image was saved for documentation purposed. The paracentesis was performed. The catheter was removed and a dressing was applied. The patient tolerated the procedure well without immediate post procedural complication.  FINDINGS: A total of approximately 2 liters of yellow fluid was removed. Samples were sent to the laboratory as requested by the clinical team.  IMPRESSION:  Successful ultrasound-guided paracentesis yielding 2 liters of peritoneal fluid.  2 liters maximum per MD.  Read by:  Lavonia Drafts [facility]   Electronically Signed   By: Daryll Brod M.D.   On: 08/16/2014 14:38    Scheduled Meds: . aspirin EC  81 mg Oral Daily  . atorvastatin  20 mg Oral Daily  . furosemide  40 mg Oral Daily  . insulin aspart  0-9 Units Subcutaneous TID WC  . propranolol  10 mg Oral BID  . spironolactone  100 mg Oral Daily   Continuous Infusions:   Active Problems:   TIA (transient ischemic attack)   Acute pulmonary edema   Right sided weakness   Sinusitis, chronic    Time spent: 15 minutes.    Nadea Kirkland  [facility] Pager [phone]. If 7PM-7AM, please contact night-coverage at [email or web address], password [email or web address] 08/17/2014, 5:41 PM  LOS: 3 days

## 2014-08-17 NOTE — Progress Notes (Signed)
Pt noted in bed lying on his side at this time. Pt complained of burning to buttock and irritation to scrotum. Skin assessment done, noted pink rash like to left buttock and scrotum. Pt denied pain or itch. Pt cleansed, dried and repositioned on side. Barrier cream applied to buttocks and scrotum. Paged Md to notify. Will continue to monitor.

## 2014-08-17 NOTE — Progress Notes (Addendum)
Patient ID: Garrett Turner, male   DOB: [birth year], 64 y.o.   MRN: [id number] [facility] Gastroenterology Progress Note  Taveon Enyeart 64 y.o. June 08, 1951   Subjective: More alert. Complaining of right-sided pain. S/P paracentesis yesterday with 2 L of ascites removed. Thinks it is "February 2015."  Objective: Vital signs in last 24 hours: Filed Vitals:   08/17/14 1033  BP: 113/70  Pulse: 82  Temp: 98.2  Resp: 18    Physical Exam: Gen: alert, no acute distress Abd: right-sided tenderness with minimal guarding, less distended, softer, +BS Neuro: no asterixis  Lab Results:  Recent Labs  08/14/14 1725 08/14/14 2223 08/17/14 0500  NA 134* 137 136  K 3.5 3.8 3.8  CL 106 105 105  CO2 21  --  27  GLUCOSE 105* 98 115*  BUN 15 14 9   CREATININE 0.88 0.90 0.94  CALCIUM 8.3*  --  8.1*  MG  --   --  1.6    Recent Labs  08/16/14 1707 08/17/14 0500  AST 53* 55*  ALT 25 25  ALKPHOS 94 99  BILITOT 2.3* 2.3*  PROT 5.3* 6.0  ALBUMIN 2.1* 2.2*    Recent Labs  08/14/14 2209 08/14/14 2223 08/17/14 0500  WBC 3.7*  --  2.6*  NEUTROABS 2.2  --   --   HGB 10.2* 10.9* 9.5*  HCT 29.8* 32.0* 27.7*  MCV 92.0  --  90.2  PLT 48*  --  42*    Recent Labs  08/14/14 1725  LABPROT 19.5*  INR 1.63*      Assessment/Plan:  64 yo with decompensated NASH cirrhosis with ascites that is negative for SBP. Lasix/Aldactone started yesterday and needs to continue as an outpt. Ok to go home tomorrow from a GI standpoint. Dr. Penelope Coop available to see this weekend if necessary. F/U with Dr. Paulita Fujita in 2-3 weeks. Will sign off. Call if questions.  [place] [facility]. 08/17/2014, 10:38 AM

## 2014-08-18 DIAGNOSIS — J189 Pneumonia, unspecified organism: Secondary | ICD-10-CM | POA: Diagnosis present

## 2014-08-18 LAB — CBC
HCT: 29 % — ABNORMAL LOW (ref 39.0–52.0)
HEMOGLOBIN: 10 g/dL — AB (ref 13.0–17.0)
MCH: 31 pg (ref 26.0–34.0)
MCHC: 34.5 g/dL (ref 30.0–36.0)
MCV: 89.8 fL (ref 78.0–100.0)
PLATELETS: 50 10*3/uL — AB (ref 150–400)
RBC: 3.23 MIL/uL — ABNORMAL LOW (ref 4.22–5.81)
RDW: 16.9 % — ABNORMAL HIGH (ref 11.5–15.5)
WBC: 3.5 10*3/uL — ABNORMAL LOW (ref 4.0–10.5)

## 2014-08-18 LAB — GLUCOSE, CAPILLARY
GLUCOSE-CAPILLARY: 139 mg/dL — AB (ref 70–99)
GLUCOSE-CAPILLARY: 155 mg/dL — AB (ref 70–99)
Glucose-Capillary: 105 mg/dL — ABNORMAL HIGH (ref 70–99)

## 2014-08-18 LAB — COMPREHENSIVE METABOLIC PANEL
ALT: 27 U/L (ref 0–53)
ANION GAP: 4 — AB (ref 5–15)
AST: 55 U/L — ABNORMAL HIGH (ref 0–37)
Albumin: 2.2 g/dL — ABNORMAL LOW (ref 3.5–5.2)
Alkaline Phosphatase: 103 U/L (ref 39–117)
BUN: 10 mg/dL (ref 6–23)
CHLORIDE: 103 mmol/L (ref 96–112)
CO2: 27 mmol/L (ref 19–32)
CREATININE: 0.94 mg/dL (ref 0.50–1.35)
Calcium: 8.1 mg/dL — ABNORMAL LOW (ref 8.4–10.5)
GFR calc Af Amer: >90 mL/min (ref >90)
GFR calc non Af Amer: 86 mL/min — ABNORMAL LOW (ref >90)
Glucose, Bld: 113 mg/dL — ABNORMAL HIGH (ref 70–99)
Potassium: 3.8 mmol/L (ref 3.5–5.1)
Sodium: 134 mmol/L — ABNORMAL LOW (ref 135–145)
TOTAL PROTEIN: 6.4 g/dL (ref 6.0–8.3)
Total Bilirubin: 2.5 mg/dL — ABNORMAL HIGH (ref 0.3–1.2)

## 2014-08-18 MED ORDER — LEVOFLOXACIN IN D5W 750 MG/150ML IV SOLN
750.0000 mg | INTRAVENOUS | Status: DC
  Administered 2014-08-18 – 2014-08-19 (×2): 750 mg via INTRAVENOUS
  Filled 2014-08-18 (×3): qty 150

## 2014-08-18 MED ORDER — DIPHENHYDRAMINE HCL 25 MG PO CAPS
25.0000 mg | ORAL_CAPSULE | Freq: Four times a day (QID) | ORAL | Status: DC | PRN
  Filled 2014-08-18 (×2): qty 1

## 2014-08-18 NOTE — Evaluation (Signed)
Occupational Therapy Evaluation Patient Details Name: Garrett Turner MRN: [id number] DOB: Apr 25, 1951 Today's Date: 08/18/2014    History of Present Illness Patient is a 64 y/o male who came to [facility] ED 08/15/2014 after being referred by his gastroenterologist reporting mild diarrhea (after a 10-day course of Augmentin for cough productive of green sputum) and progressive SOB, DOE, swelling, and weakness.  The patient was sent to the ER for eval of the SOB and developed acute onset of right sided weakness and speech deficits after returning from CT scan of his chest. Code Stroke initiated and pt transferred to [facility]. CT and MRI head- unremarkable.  s/p paracentesis 1/28 2L.    Clinical Impression   This 64 yo male admitted with above presents to acute OT with decreased balance, decreased mobility, decreased functional use of RUE, increased pain in right thumb all affecting his ability to care for himself at home alone. Feel pt would benefit from acute OT with follow up OT at SNF to get to an Independent to Mod I level.    Follow Up Recommendations  SNF    Equipment Recommendations  None recommended by OT       Precautions / Restrictions Precautions Precautions: Fall Precaution Comments: monitor 02---93% on RA post ambulating; however 4/4 DOE had to take 3 standing rest breaks with 50 feet of ambulation Restrictions Weight Bearing Restrictions: No      Mobility Bed Mobility               General bed mobility comments: Pt up in recliner upon arrival  Transfers Overall transfer level: Needs assistance Equipment used: None   Sit to Stand: Min guard         General transfer comment: Pt does not report dizzines, but he is having 4/4 DOE with ambulation (having to stop x3 during 50 feet walk), sats 93% on RA when he sat down         ADL Overall ADL's : Needs assistance/impaired Eating/Feeding: Modified independent;Sitting Eating/Feeding Details (indicate cue type and reason):  mainly uses LUE (even though he is right handed) and increased time Grooming: Sitting;Set up Grooming Details (indicate cue type and reason): mainly uses LUE (even though he is right handed) and increased time Upper Body Bathing: Sitting;Set up Upper Body Bathing Details (indicate cue type and reason): mainly uses LUE (even though he is right handed) and increased time Lower Body Bathing: Min guard Lower Body Bathing Details (indicate cue type and reason): mainly uses LUE (even though he is right handed) and increased time Upper Body Dressing : Sitting;Set up Upper Body Dressing Details (indicate cue type and reason): mainly uses LUE (even though he is right handed) and increased time Lower Body Dressing: Min guard Lower Body Dressing Details (indicate cue type and reason): mainly uses LUE (even though he is right handed) and increased time Toilet Transfer: Min guard;Ambulation;Comfort height toilet;Grab bars (no AD)   Toileting- Clothing Manipulation and Hygiene: Min guard;Sit to/from stand                         Pertinent Vitals/Pain Pain Assessment: 0-10 Pain Score: 8  Pain Location: left thumb with movement Pain Descriptors / Indicators: Sharp;Shooting Pain Intervention(s): Monitored during session;Repositioned     Hand Dominance Right   Extremity/Trunk Assessment Upper Extremity Assessment Upper Extremity Assessment: RUE deficits/detail RUE Deficits / Details: Drift for shoulder flexion compared to left, elbow grossly 3/5; forearm grossly 3/5; grip 2/5; pt with pain in  thumb with A/PROM (pt reports his hand has progressively been getting this was for a couple of years and he uses if functionally some, but mainly relies on LUE). 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 RUE Sensation: decreased proprioception (says he often drops things that he is holding in his right hand) RUE Coordination: decreased fine motor;decreased gross motor           Communication  Communication Communication: No difficulties   Cognition Arousal/Alertness: Awake/alert Behavior During Therapy: WFL for tasks assessed/performed Overall Cognitive Status: Within Functional Limits for tasks assessed                                Home Living Family/patient expects to be discharged to:: Private residence Living Arrangements: Alone   Type of Home: House Home Access: Level entry     Home Layout: One level     Bathroom Shower/Tub: Occupational psychologist: Handicapped height     Home Equipment: Environmental consultant - 2 wheels          Prior Functioning/Environment Level of Independence: Independent             OT Diagnosis: Generalized weakness;Hemiplegia dominant side   OT Problem List: Decreased strength;Decreased range of motion;Impaired UE functional use;Pain;Impaired balance (sitting and/or standing);Decreased coordination   OT Treatment/Interventions: Self-care/ADL training;Patient/family education;Therapeutic activities;Therapeutic exercise;Balance training    OT Goals(Current goals can be found in the care plan section) Acute Rehab OT Goals Patient Stated Goal: maybe to rehab before home OT Goal Formulation: With patient Time For Goal Achievement: 08/25/14 Potential to Achieve Goals: Good  OT Frequency: Min 2X/week   Barriers to D/C: Decreased caregiver support             End of Session    Activity Tolerance: Patient limited by fatigue Patient left: in chair;with call bell/phone within reach;with chair alarm set   Time: 1411-1458 OT Time Calculation (min): 47 min Charges:  OT General Charges $OT Visit: 1 Procedure OT Treatments $Self Care/Home Management : 23-37 mins  Almon Register [phone] 08/18/2014, 3:05 PM

## 2014-08-18 NOTE — Progress Notes (Signed)
Pt ambulated to the bathroom to take a shower standby assist x1. Pt stable, neuro intact. Telemetry called to make aware.  Steady gait. Denies pain or discomfort. Barrier cream applied to buttocks and scrotum. Will continue to monitor.

## 2014-08-18 NOTE — Progress Notes (Signed)
[facility] PROGRESS NOTE  Garrett Turner BZJ:[id number] DOB: 1950-07-22 DOA: 08/14/2014 PCP: Lorayne Marek, MD  Summary Appreciate neurology/GI. Garrett Turner is a very pleasant 64 y/o male with DM2, HTN, with NASH cirrhosis ( sees Dr Ethlyn Daniels) who presented to [facility] for dyspnea and leg swelling and developed rt sided weakness and speech impairment while in the ED therefore transferred from [facility] post admission for stroke w/up. Head CT negative. No TPA given due to low platelets. Stroke workup including brain MRI/carotid Doppler/2-D echocardiogram unrevealing. However, his blood pressure has been on the low side and this may have been the primary cause of the transient symptoms in the ED. Question is underlying cause which could include medications/infection. He had paracentesis done by Dr. Michail Sermon and this did not suggest SBP. UA unremarkable . I discontinued Losartan for now in view of low blood pressure but continued propranolol and diuretics per GI. Ammonia level is normal. Patient was started on low-dose aspirin per neurology recommendations mindful of thrombocytopenia. Patient reports that he got short of breath while ambulating with physical therapy assistance again today. CT chest shows "Moderate left pleural effusion is noted which is slightly improved compared to prior exam. Stable multifocal airspace opacities are noted in both lungs which may represent pneumonia, subsegmental atelectasis or possibly scarring. Followup CT scan in 7 in 10 days is recommended to ensure resolution or stability these abnormalities. Stable paraseptal thickening is noted peripherally in both lungs which is most consistent with scarring, but edema cannot be excluded. Continued presence of hepatic cirrhosis with splenomegaly and moderate ascites". Will add Levaquin in view of possibility of pneumonia and continued respiratory distress. Plan ?TIA  On ASA/ lipitor. Follow Neuro recommendations  Cirrhosis of  liver with ? Acute decompensation Defer to GI recommendations. Hold losartan in view of low normal BP  Pulmonary edema/PNA Levaquin    Hx of gallstones Patient seen by surgery and given underlying cirrhosis Have recommended to hold off on sx at this time.  DM type 2 last A1C of 6 on metformin as outpt. Monitor on SSI.  Rt upper lobe nodule 13 mm RUL nodule seen on CTA. Recommended follow up CT after resolution of interstitial infiltrate/ edema. Can be repeated in 4 -6 weeks as outpt  DVT prophylaxis: SCD  Diet: heart healthy    Code Status:full code Family Communication: none at bedside Disposition Plan: For short-term rehabilitation..   Consultants:  Neuro  GI  Procedures:  MRI brain  2 d echo  Carotid Doppler  CT chest  Antibiotics:  Levaquin 08/18/2014>  HPI/Subjective: Says he feels short of breath with minimal exertion, including when eating.  Objective: Filed Vitals:   08/18/14 1400  BP: 93/53  Pulse: 60  Temp: 97.9 F (36.6 C)  Resp: 18    Intake/Output Summary (Last 24 hours) at 08/18/14 1708 Last data filed at 08/18/14 1604  Gross per 24 hour  Intake    780 ml  Output    750 ml  Net     30 ml   Filed Weights   08/15/14 0316  Weight: 84.687 kg (186 lb 11.2 oz)    Exam:   General:  Sitting up eating. Mild respiratory distress.  Cardiovascular: S1-S2 normal. No murmurs. RRR.  Respiratory: Slightly dyspneic. Scattered rhonchi bilaterally.  Abdomen: Uniformly distended but soft and nontender. Normal bowel sounds.  Musculoskeletal: Bilateral pitting pedal edema.   Data Reviewed: Basic Metabolic Panel:  Recent Labs Lab 08/14/14 1725 08/14/14 2223 08/17/14 0500 08/18/14 0627  NA 134*  137 136 134*  K 3.5 3.8 3.8 3.8  CL 106 105 105 103  CO2 21  --  27 27  GLUCOSE 105* 98 115* 113*  BUN 15 14 9 10   CREATININE 0.88 0.90 0.94 0.94  CALCIUM 8.3*  --  8.1* 8.1*  MG  --   --  1.6  --    Liver Function  Tests:  Recent Labs Lab 08/14/14 1725 08/16/14 1707 08/17/14 0500 08/18/14 0627  AST 75* 53* 55* 55*  ALT 34 25 25 27   ALKPHOS 121* 94 99 103  BILITOT 3.4* 2.3* 2.3* 2.5*  PROT 7.1 5.3* 6.0 6.4  ALBUMIN 2.9* 2.1* 2.2* 2.2*   No results for input(s): LIPASE, AMYLASE in the last 168 hours.  Recent Labs Lab 08/17/14 0500  AMMONIA 27   CBC:  Recent Labs Lab 08/14/14 1725 08/14/14 2209 08/14/14 2223 08/17/14 0500 08/18/14 0627  WBC 4.8 3.7*  --  2.6* 3.5*  NEUTROABS  --  2.2  --   --   --   HGB 10.5* 10.2* 10.9* 9.5* 10.0*  HCT 31.7* 29.8* 32.0* 27.7* 29.0*  MCV 92.7 92.0  --  90.2 89.8  PLT 75* 48*  --  42* 50*   Cardiac Enzymes: No results for input(s): CKTOTAL, CKMB, CKMBINDEX, TROPONINI in the last 168 hours. BNP (last 3 results)  Recent Labs  10/17/13 1324  PROBNP 121.1   CBG:  Recent Labs Lab 08/17/14 1656 08/17/14 2113 08/18/14 0646 08/18/14 1108 08/18/14 1610  GLUCAP 110* 133* 105* 139* 155*    Recent Results (from the past 240 hour(s))  MRSA PCR Screening     Status: None   Collection Time: 08/15/14  8:57 AM  Result Value Ref Range Status   MRSA by PCR NEGATIVE NEGATIVE Final    Comment:        The GeneXpert MRSA Assay (FDA approved for NASAL specimens only), is one component of a comprehensive MRSA colonization surveillance program. It is not intended to diagnose MRSA infection nor to guide or monitor treatment for MRSA infections.   Culture, Urine     Status: None   Collection Time: 08/16/14  5:50 PM  Result Value Ref Range Status   Specimen Description URINE, RANDOM  Final   Special Requests NONE  Final   Colony Count NO GROWTH Performed at [facility]   Final   Culture NO GROWTH Performed at [facility]   Final   Report Status 08/17/2014 FINAL  Final     Studies: Ct Chest Wo Contrast  08/17/2014   CLINICAL DATA:  Shortness of breath, pulmonary edema.  EXAM: CT CHEST WITHOUT CONTRAST  TECHNIQUE:  Multidetector CT imaging of the chest was performed following the standard protocol without IV contrast.  COMPARISON:  CT scan of August 14, 2014.  FINDINGS: No pneumothorax is noted. Moderate left pleural effusion is noted which may be slightly improved compared to prior exam. Stable small multifocal opacities are noted in the right upper lobe which may simply represent pneumonia. Opacity is noted peripherally in the left upper lobe laterally which is unchanged and may represent scarring, pneumonia or subsegmental atelectasis. Similar density is seen along the right major fissure laterally in the right lung base. Probable minimal subsegmental atelectasis is noted in the right posterior costophrenic sulcus. Peripheral septal thickening is noted bilaterally which is unchanged compared to prior exam and is most consistent with scarring, or possible edema. No significant osseous abnormality is noted.  Within the visualized portion  the abdomen, there is noted moderate ascites with hepatic cirrhosis. Moderate splenomegaly is noted.  IMPRESSION: Moderate left pleural effusion is noted which is slightly improved compared to prior exam. Stable multifocal airspace opacities are noted in both lungs which may represent pneumonia, subsegmental atelectasis or possibly scarring. Followup CT scan in 7 in 10 days is recommended to ensure resolution or stability these abnormalities.  Stable paraseptal thickening is noted peripherally in both lungs which is most consistent with scarring, but edema cannot be excluded.  Continued presence of hepatic cirrhosis with splenomegaly and moderate ascites.   Electronically Signed   By: Sabino Dick M.D.   On: 08/17/2014 19:57    Scheduled Meds: . aspirin EC  81 mg Oral Daily  . atorvastatin  20 mg Oral Daily  . furosemide  40 mg Oral Daily  . insulin aspart  0-9 Units Subcutaneous TID WC  . levofloxacin (LEVAQUIN) IV  750 mg Intravenous Q24H  . propranolol  10 mg Oral BID  .  spironolactone  100 mg Oral Daily   Continuous Infusions:   Active Problems:   TIA (transient ischemic attack)   Acute pulmonary edema   Right sided weakness   Sinusitis, chronic    Time spent: 20 minutes.    Miyonna Ormiston  [facility] Pager [phone]. If 7PM-7AM, please contact night-coverage at [email or web address], password [facility] 08/18/2014, 5:08 PM  LOS: 4 days

## 2014-08-19 DIAGNOSIS — J189 Pneumonia, unspecified organism: Secondary | ICD-10-CM

## 2014-08-19 DIAGNOSIS — G4700 Insomnia, unspecified: Secondary | ICD-10-CM | POA: Diagnosis present

## 2014-08-19 LAB — GLUCOSE, CAPILLARY
GLUCOSE-CAPILLARY: 123 mg/dL — AB (ref 70–99)
GLUCOSE-CAPILLARY: 144 mg/dL — AB (ref 70–99)
Glucose-Capillary: 140 mg/dL — ABNORMAL HIGH (ref 70–99)
Glucose-Capillary: 150 mg/dL — ABNORMAL HIGH (ref 70–99)
Glucose-Capillary: 130 mg/dL — ABNORMAL HIGH (ref 70–99)

## 2014-08-19 LAB — COMPREHENSIVE METABOLIC PANEL
ALK PHOS: 107 U/L (ref 39–117)
ALT: 27 U/L (ref 0–53)
ANION GAP: 5 (ref 5–15)
AST: 63 U/L — ABNORMAL HIGH (ref 0–37)
Albumin: 2.2 g/dL — ABNORMAL LOW (ref 3.5–5.2)
BILIRUBIN TOTAL: 2.3 mg/dL — AB (ref 0.3–1.2)
BUN: 9 mg/dL (ref 6–23)
CALCIUM: 8 mg/dL — AB (ref 8.4–10.5)
CO2: 24 mmol/L (ref 19–32)
CREATININE: 0.86 mg/dL (ref 0.50–1.35)
Chloride: 105 mmol/L (ref 96–112)
GFR calc non Af Amer: 89 mL/min — ABNORMAL LOW (ref >90)
Glucose, Bld: 139 mg/dL — ABNORMAL HIGH (ref 70–99)
POTASSIUM: 4 mmol/L (ref 3.5–5.1)
Sodium: 134 mmol/L — ABNORMAL LOW (ref 135–145)
Total Protein: 5.8 g/dL — ABNORMAL LOW (ref 6.0–8.3)

## 2014-08-19 LAB — CBC
HCT: 28.6 % — ABNORMAL LOW (ref 39.0–52.0)
Hemoglobin: 9.8 g/dL — ABNORMAL LOW (ref 13.0–17.0)
MCH: 31.3 pg (ref 26.0–34.0)
MCHC: 34.3 g/dL (ref 30.0–36.0)
MCV: 91.4 fL (ref 78.0–100.0)
Platelets: 54 10*3/uL — ABNORMAL LOW (ref 150–400)
RBC: 3.13 MIL/uL — ABNORMAL LOW (ref 4.22–5.81)
RDW: 17 % — AB (ref 11.5–15.5)
WBC: 3.7 10*3/uL — ABNORMAL LOW (ref 4.0–10.5)

## 2014-08-19 MED ORDER — ZOLPIDEM TARTRATE 5 MG PO TABS
5.0000 mg | ORAL_TABLET | Freq: Every evening | ORAL | Status: DC | PRN
  Administered 2014-08-19 – 2014-08-22 (×4): 5 mg via ORAL
  Filled 2014-08-19 (×4): qty 1

## 2014-08-19 NOTE — Progress Notes (Signed)
Pt stated to this nurse that lately he has been feeling depressed about life lately. States that he lost wife 2 years ago this past October and he is very lonesome and lacks companionship. He requested something to help him with relief. He states that with his age and health, he isn't able to work and income is low. He states that he is not a danger to self. Md paged. Will continue to monitor.

## 2014-08-19 NOTE — Progress Notes (Signed)
[facility] PROGRESS NOTE  Garrett Turner DXI:[id number] DOB: Dec 15, 1950 DOA: 08/14/2014 PCP: Lorayne Marek, MD  Summary Appreciate neurology/GI. Garrett Turner is a very pleasant 64 y/o male with DM2, HTN, with NASH cirrhosis ( sees Dr Ethlyn Daniels) who presented to [facility] for dyspnea and leg swelling and developed rt sided weakness and speech impairment while in the ED therefore transferred from [facility] post admission for stroke w/up. Head CT negative. No TPA given due to low platelets. Stroke workup including brain MRI/carotid Doppler/2-D echocardiogram unrevealing. However, his blood pressure has been on the low side and this may have been the primary cause of the transient symptoms in the ED. Question is underlying cause which could include medications/infection. He had paracentesis done by Dr. Michail Sermon and this did not suggest SBP. UA unremarkable . I discontinued Losartan for now in view of low blood pressure but continued propranolol and diuretics per GI. Ammonia level is normal. Patient was started on low-dose aspirin per neurology recommendations mindful of thrombocytopenia. Patient reports that he got short of breath while ambulating with physical therapy assistance again today. CT chest shows "Moderate left pleural effusion is noted which is slightly improved compared to prior exam. Stable multifocal airspace opacities are noted in both lungs which may represent pneumonia, subsegmental atelectasis or possibly scarring. Followup CT scan in 7 in 10 days is recommended to ensure resolution or stability these abnormalities. Stable paraseptal thickening is noted peripherally in both lungs which is most consistent with scarring, but edema cannot be excluded. Continued presence of hepatic cirrhosis with splenomegaly and moderate ascites". He is on day 2 Levaquin for Pneumonia and reports less dyspnea. He needs short-term rehabilitation placement. Plan ?TIA  On ASA/ lipitor. Follow Neuro  recommendations  Cirrhosis of liver with ? Acute decompensation Defer to GI recommendations. Hold losartan in view of low normal BP  Pulmonary edema/PNA Levaquin    Hx of gallstones Patient seen by surgery and given underlying cirrhosis Have recommended to hold off on sx at this time.  DM type 2 last A1C of 6 on metformin as outpt. Monitor on SSI.  Rt upper lobe nodule 13 mm RUL nodule seen on CTA. Recommended follow up CT after resolution of interstitial infiltrate/ edema. Can be repeated in 4 -6 weeks as outpt  DVT prophylaxis: SCD  Diet: heart healthy    Code Status:full code Family Communication: none at bedside Disposition Plan: For short-term rehabilitation.   Consultants:  Neuro  GI  Procedures:  MRI brain  2 d echo  Carotid Doppler  CT chest  Antibiotics:  Levaquin 08/18/2014>  HPI/Subjective: Says he is less dyspneic.  Objective: Filed Vitals:   08/19/14 1015  BP: 118/72  Pulse: 62  Temp:   Resp: 18    Intake/Output Summary (Last 24 hours) at 08/19/14 1342 Last data filed at 08/19/14 0817  Gross per 24 hour  Intake    240 ml  Output    800 ml  Net   -560 ml   Filed Weights   08/15/14 0316  Weight: 84.687 kg (186 lb 11.2 oz)    Exam:   General:  Sitting up eating. Not in distress.  Cardiovascular: S1-S2 heard. No murmurs.  Respiratory: Good air entry bilaterally with some rhonchi. No wheezing.  Abdomen: Soft and nontender.  Musculoskeletal: Pedal edema improved.   Data Reviewed: Basic Metabolic Panel:  Recent Labs Lab 08/14/14 1725 08/14/14 2223 08/17/14 0500 08/18/14 0627 08/19/14 0355  NA 134* 137 136 134* 134*  K 3.5 3.8 3.8 3.8  4.0  CL 106 105 105 103 105  CO2 21  --  27 27 24   GLUCOSE 105* 98 115* 113* 139*  BUN 15 14 9 10 9   CREATININE 0.88 0.90 0.94 0.94 0.86  CALCIUM 8.3*  --  8.1* 8.1* 8.0*  MG  --   --  1.6  --   --    Liver Function Tests:  Recent Labs Lab 08/14/14 1725  08/16/14 1707 08/17/14 0500 08/18/14 0627 08/19/14 0355  AST 75* 53* 55* 55* 63*  ALT 34 25 25 27 27   ALKPHOS 121* 94 99 103 107  BILITOT 3.4* 2.3* 2.3* 2.5* 2.3*  PROT 7.1 5.3* 6.0 6.4 5.8*  ALBUMIN 2.9* 2.1* 2.2* 2.2* 2.2*   No results for input(s): LIPASE, AMYLASE in the last 168 hours.  Recent Labs Lab 08/17/14 0500  AMMONIA 27   CBC:  Recent Labs Lab 08/14/14 1725 08/14/14 2209 08/14/14 2223 08/17/14 0500 08/18/14 0627 08/19/14 0355  WBC 4.8 3.7*  --  2.6* 3.5* 3.7*  NEUTROABS  --  2.2  --   --   --   --   HGB 10.5* 10.2* 10.9* 9.5* 10.0* 9.8*  HCT 31.7* 29.8* 32.0* 27.7* 29.0* 28.6*  MCV 92.7 92.0  --  90.2 89.8 91.4  PLT 75* 48*  --  42* 50* 54*   Cardiac Enzymes: No results for input(s): CKTOTAL, CKMB, CKMBINDEX, TROPONINI in the last 168 hours. BNP (last 3 results)  Recent Labs  10/17/13 1324  PROBNP 121.1   CBG:  Recent Labs Lab 08/18/14 1108 08/18/14 1610 08/18/14 2200 08/19/14 0643 08/19/14 1121  GLUCAP 139* 155* 140* 123* 150*    Recent Results (from the past 240 hour(s))  MRSA PCR Screening     Status: None   Collection Time: 08/15/14  8:57 AM  Result Value Ref Range Status   MRSA by PCR NEGATIVE NEGATIVE Final    Comment:        The GeneXpert MRSA Assay (FDA approved for NASAL specimens only), is one component of a comprehensive MRSA colonization surveillance program. It is not intended to diagnose MRSA infection nor to guide or monitor treatment for MRSA infections.   Culture, Urine     Status: None   Collection Time: 08/16/14  5:50 PM  Result Value Ref Range Status   Specimen Description URINE, RANDOM  Final   Special Requests NONE  Final   Colony Count NO GROWTH Performed at [facility]   Final   Culture NO GROWTH Performed at [facility]   Final   Report Status 08/17/2014 FINAL  Final     Studies: Ct Chest Wo Contrast  08/17/2014   CLINICAL DATA:  Shortness of breath, pulmonary edema.  EXAM:  CT CHEST WITHOUT CONTRAST  TECHNIQUE: Multidetector CT imaging of the chest was performed following the standard protocol without IV contrast.  COMPARISON:  CT scan of August 14, 2014.  FINDINGS: No pneumothorax is noted. Moderate left pleural effusion is noted which may be slightly improved compared to prior exam. Stable small multifocal opacities are noted in the right upper lobe which may simply represent pneumonia. Opacity is noted peripherally in the left upper lobe laterally which is unchanged and may represent scarring, pneumonia or subsegmental atelectasis. Similar density is seen along the right major fissure laterally in the right lung base. Probable minimal subsegmental atelectasis is noted in the right posterior costophrenic sulcus. Peripheral septal thickening is noted bilaterally which is unchanged compared to prior exam and  is most consistent with scarring, or possible edema. No significant osseous abnormality is noted.  Within the visualized portion the abdomen, there is noted moderate ascites with hepatic cirrhosis. Moderate splenomegaly is noted.  IMPRESSION: Moderate left pleural effusion is noted which is slightly improved compared to prior exam. Stable multifocal airspace opacities are noted in both lungs which may represent pneumonia, subsegmental atelectasis or possibly scarring. Followup CT scan in 7 in 10 days is recommended to ensure resolution or stability these abnormalities.  Stable paraseptal thickening is noted peripherally in both lungs which is most consistent with scarring, but edema cannot be excluded.  Continued presence of hepatic cirrhosis with splenomegaly and moderate ascites.   Electronically Signed   By: Sabino Dick M.D.   On: 08/17/2014 19:57    Scheduled Meds: . aspirin EC  81 mg Oral Daily  . atorvastatin  20 mg Oral Daily  . furosemide  40 mg Oral Daily  . insulin aspart  0-9 Units Subcutaneous TID WC  . levofloxacin (LEVAQUIN) IV  750 mg Intravenous Q24H  .  propranolol  10 mg Oral BID  . spironolactone  100 mg Oral Daily   Continuous Infusions:   Active Problems:   TIA (transient ischemic attack)   Acute pulmonary edema   Right sided weakness   Sinusitis, chronic   CAP (community acquired pneumonia)   Insomnia    Time spent: 15 minutes.    Garrett Turner  [facility] Pager [phone]. If 7PM-7AM, please contact night-coverage at [email or web address], password [facility] 08/19/2014, 1:42 PM  LOS: 5 days

## 2014-08-20 LAB — BASIC METABOLIC PANEL
Anion gap: 4 — ABNORMAL LOW (ref 5–15)
BUN: 12 mg/dL (ref 6–23)
CO2: 23 mmol/L (ref 19–32)
CREATININE: 0.92 mg/dL (ref 0.50–1.35)
Calcium: 7.9 mg/dL — ABNORMAL LOW (ref 8.4–10.5)
Chloride: 106 mmol/L (ref 96–112)
GFR calc non Af Amer: 87 mL/min — ABNORMAL LOW (ref >90)
Glucose, Bld: 115 mg/dL — ABNORMAL HIGH (ref 70–99)
Potassium: 4.2 mmol/L (ref 3.5–5.1)
SODIUM: 133 mmol/L — AB (ref 135–145)

## 2014-08-20 LAB — CBC
HCT: 27.8 % — ABNORMAL LOW (ref 39.0–52.0)
HEMOGLOBIN: 9.7 g/dL — AB (ref 13.0–17.0)
MCH: 31.2 pg (ref 26.0–34.0)
MCHC: 34.9 g/dL (ref 30.0–36.0)
MCV: 89.4 fL (ref 78.0–100.0)
PLATELETS: 55 10*3/uL — AB (ref 150–400)
RBC: 3.11 MIL/uL — AB (ref 4.22–5.81)
RDW: 16.9 % — AB (ref 11.5–15.5)
WBC: 4 10*3/uL (ref 4.0–10.5)

## 2014-08-20 LAB — GLUCOSE, CAPILLARY
GLUCOSE-CAPILLARY: 139 mg/dL — AB (ref 70–99)
Glucose-Capillary: 116 mg/dL — ABNORMAL HIGH (ref 70–99)
Glucose-Capillary: 140 mg/dL — ABNORMAL HIGH (ref 70–99)
Glucose-Capillary: 122 mg/dL — ABNORMAL HIGH (ref 70–99)

## 2014-08-20 MED ORDER — LEVOFLOXACIN 750 MG PO TABS
750.0000 mg | ORAL_TABLET | ORAL | Status: AC
  Administered 2014-08-20 – 2014-08-22 (×3): 750 mg via ORAL
  Filled 2014-08-20 (×3): qty 1

## 2014-08-20 NOTE — Progress Notes (Signed)
Physical Therapy Treatment Patient Details Name: Garrett Turner MRN: [id number] DOB: 21-May-1951 Today's Date: 08/20/2014    History of Present Illness Patient is a 64 y/o male who came to [facility] ED 08/15/2014 after being referred by his gastroenterologist reporting mild diarrhea (after a 10-day course of Augmentin for cough productive of green sputum) and progressive SOB, DOE, swelling, and weakness.  The patient was sent to the ER for eval of the SOB and developed acute onset of right sided weakness and speech deficits after returning from CT scan of his chest. Code Stroke initiated and pt transferred to [facility]. CT and MRI head- unremarkable.  s/p paracentesis 1/28 2L.     PT Comments    Pt. Would benefit from more gait training to increase endurance and balance training next session. Didn't report or appear short of breath this session during walking or after sitting down. Pt. Reported pain in stomach because only one BM in four days.   Follow Up Recommendations  SNF;Supervision/Assistance - 24 hour     Equipment Recommendations  None recommended by PT    Recommendations for Other Services OT consult     Precautions / Restrictions Precautions Precautions: Fall Restrictions Weight Bearing Restrictions: No    Mobility  Bed Mobility Overal bed mobility: Needs Assistance Bed Mobility: Supine to Sit     Supine to sit: Supervision;HOB elevated     General bed mobility comments: supervision for safety  Transfers Overall transfer level: Needs assistance Equipment used: Rolling walker (2 wheeled) Transfers: Sit to/from Stand Sit to Stand: Min guard;Supervision         General transfer comment: vc's for hand placement and LE placement before sitting and vc's to go down slower, serial sit to stand x 3  Ambulation/Gait Ambulation/Gait assistance: Min guard Ambulation Distance (Feet): 50 Feet Assistive device: Rolling walker (2 wheeled) Gait Pattern/deviations: Step-through  pattern;Trunk flexed Gait velocity: decreased Gait velocity interpretation: Below normal speed for age/gender General Gait Details: Pt. reported no SOB took one rest break. cues for posture to help breathing   Stairs            Wheelchair Mobility    Modified Rankin (Stroke Patients Only)       Balance                                    Cognition Arousal/Alertness: Awake/alert Behavior During Therapy: WFL for tasks assessed/performed Overall Cognitive Status: Within Functional Limits for tasks assessed                      Exercises      General Comments        Pertinent Vitals/Pain Pain Score: 2  Pain Location: stomach ache Pain Descriptors / Indicators: Aching Pain Intervention(s): Monitored during session;Repositioned    Home Living                      Prior Function            PT Goals (current goals can now be found in the care plan section) Progress towards PT goals: Progressing toward goals    Frequency  Min 3X/week    PT Plan Current plan remains appropriate    Co-evaluation             End of Session Equipment Utilized During Treatment: Gait belt Activity Tolerance: Patient tolerated treatment well Patient left: in  chair;with call bell/phone within reach;with chair alarm set     Time: 1340-1410 PT Time Calculation (min) (ACUTE ONLY): 30 min  Charges:                       G Codes:      Jodi Geralds, SPTA 08/20/2014, 2:46 PM

## 2014-08-20 NOTE — Progress Notes (Signed)
Nurse notified MD, Dr. Sanjuana Letters, via text message that pt was depressed and wanted to be treated. Will monitor for return call/orders   Garrett Turner I 08/20/2014 3:27 PM'

## 2014-08-20 NOTE — Progress Notes (Signed)
Chaplain referred to pt from pt nurse. Chaplain offered emotional support and prayer. Pt concerned about going to SNF due to his wife's poor experience three years ago. Pt stated that he is still weighing the options of SNF vs. home for tomorrow. Pt is supported by two daughters and spiritually supported by his church, [employer] chaplain as needed.    08/20/14 1600  Clinical Encounter Type  Visited With Patient  Visit Type Follow-up;Spiritual support  Referral From Nurse  Spiritual Encounters  Spiritual Needs Prayer;Emotional  Stress Factors  Patient Stress Factors Health changes  Kahlani Graber, Epifanio Lesches 08/20/2014 4:34 PM

## 2014-08-20 NOTE — Clinical Social Work Note (Signed)
Clinical Social Worker has assessed patient at bedside. Full psychosocial assessment to follow.   FL-2 on chart for MD signature.   Glendon Axe, MSW, LCSWA [phone] 08/20/2014 3:30 PM

## 2014-08-20 NOTE — Clinical Social Work Psychosocial (Signed)
Clinical Social Work Department BRIEF PSYCHOSOCIAL ASSESSMENT 08/20/2014  Patient:  Garrett Turner, Garrett Turner     Account Number:  [id number]     Admit date:  08/14/2014  Clinical Social Worker:  Glendon Axe, CLINICAL SOCIAL WORKER  Date/Time:  08/20/2014 07:45 PM  Referred by:  Physician  Date Referred:  08/20/2014 Referred for  SNF Placement   Other Referral:   Interview type:  Patient Other interview type:    PSYCHOSOCIAL DATA Living Status:  ALONE Admitted from facility: n/a  Level of care:  n/a Primary support name:  [name] Primary support relationship to patient:  CHILD, ADULT Degree of support available:   Good    CURRENT CONCERNS Current Concerns  Post-Acute Placement   Other Concerns:    SOCIAL WORK ASSESSMENT / PLAN Clinical Social Worker met with patient at length in reference to post-acute placement for SNF. CSW introduced CSW role and SNF process. CSW also reviewed/ provided SNF list. Pt stated he is depressed and feels alone. Pt reported he lives alone and does not have much family in the area. Pt further reported his daughter lives out of town however checks on him often. Pt reported before his wife passed she received rehab at [place] however he does not desire placement at [facility]. Pt further reported is has heard good things about [facility] and is interested in placement. Pt stated "I'm just depressed, I just don't know what's going to happen to me". Pt further stated he has not slept well since being in the [place] spoke with pt about feelings of depressed and reviewed counseling resources as well. CSW has faxed pt's clinicals to SNF's in [place]. FL-2 on chart for MD signature. CSW will continue to follow pt for continued support and to facilitate pt's discharge needs once medically stable.   Assessment/plan status:  Psychosocial Support/Ongoing Assessment of Needs Other assessment/ plan:   Information/referral to community  resources:   SNF information/ list provided.    PATIENT'S/FAMILY'S RESPONSE TO PLAN OF CARE: Pt alert and oriented X4. Pt reported he is depressed and feels alone. Pt is agreeable to SNF and requested placement at [facility]. Pt pleasant and apperciated social work intervention.     Glendon Axe, MSW, LCSWA [phone] 08/20/2014 8:06 PM

## 2014-08-20 NOTE — Progress Notes (Signed)
[facility] PROGRESS NOTE  Garrett Turner MPN:[id number] DOB: [birth year] DOA: 08/14/2014 PCP: Lorayne Marek, MD  Summary Appreciate neurology/GI. Garrett Turner is a very pleasant 64 y/o male with DM2, HTN, with NASH cirrhosis ( sees Dr Ethlyn Daniels) who presented to [facility] for dyspnea and leg swelling and developed rt sided weakness and speech impairment while in the ED therefore transferred from [facility] post admission for stroke w/up. Head CT negative. No TPA given due to low platelets. Stroke workup including brain MRI/carotid Doppler/2-D echocardiogram unrevealing. However, his blood pressure has been on the low side and this may have been the primary cause of the transient symptoms in the ED. Question is underlying cause which could include medications/infection. He had paracentesis done by Dr. Michail Sermon and this did not suggest SBP. UA unremarkable . I discontinued Losartan for now in view of low blood pressure but continued propranolol and diuretics per GI. Ammonia level is normal. Patient was started on low-dose aspirin per neurology recommendations mindful of thrombocytopenia. Patient reports that he got short of breath while ambulating with physical therapy assistance again today. CT chest shows "Moderate left pleural effusion is noted which is slightly improved compared to prior exam. Stable multifocal airspace opacities are noted in both lungs which may represent pneumonia, subsegmental atelectasis or possibly scarring. Followup CT scan in 7 in 10 days is recommended to ensure resolution or stability these abnormalities. Stable paraseptal thickening is noted peripherally in both lungs which is most consistent with scarring, but edema cannot be excluded. Continued presence of hepatic cirrhosis with splenomegaly and moderate ascites". He is on day 3 Levaquin for Pneumonia and reports continued improvement. He will discharge to short-term rehabilitation once bed becomes available. Plan ?TIA  On ASA/  lipitor. Follow Neuro recommendations  Cirrhosis of liver with ? Acute decompensation Defer to GI recommendations. Hold losartan in view of low normal BP  Pulmonary edema/PNA Levaquin    Hx of gallstones Patient seen by surgery and given underlying cirrhosis Have recommended to hold off on sx at this time.  DM type 2 last A1C of 6 on metformin as outpt. Monitor on SSI.  Rt upper lobe nodule 13 mm RUL nodule seen on CTA. Recommended follow up CT after resolution of interstitial infiltrate/ edema. Can be repeated in 4 -6 weeks as outpt  DVT prophylaxis: SCD  Diet: heart healthy    Code Status:full code Family Communication: none at bedside Disposition Plan: For short-term rehabilitation.   Consultants:  Neuro  GI  Procedures:  MRI brain  2 d echo  Carotid Doppler  CT chest  Antibiotics:  Levaquin 08/18/2014>  HPI/Subjective: Denies any complaints  Objective: Filed Vitals:   08/20/14 1815  BP: 104/60  Pulse: 72  Temp: 98.7 F (37.1 C)  Resp: 20    Intake/Output Summary (Last 24 hours) at 08/20/14 1828 Last data filed at 08/20/14 0086  Gross per 24 hour  Intake    240 ml  Output    950 ml  Net   -710 ml   Filed Weights   08/15/14 0316  Weight: 84.687 kg (186 lb 11.2 oz)    Exam:   General:  Sitting up in chair. Not in distress  Cardiovascular: S1-S2 normal. No murmurs. RRR.  Respiratory: Good air entry bilaterally. No rhonchi or rails.  Abdomen: Soft and nontender. Normal bowel sounds.  Musculoskeletal: No pedal edema.   Data Reviewed: Basic Metabolic Panel:  Recent Labs Lab 08/14/14 1725 08/14/14 2223 08/17/14 0500 08/18/14 7619 08/19/14 0355 08/20/14 0730  NA 134* 137 136 134* 134* 133*  K 3.5 3.8 3.8 3.8 4.0 4.2  CL 106 105 105 103 105 106  CO2 21  --  27 27 24 23   GLUCOSE 105* 98 115* 113* 139* 115*  BUN 15 14 9 10 9 12   CREATININE 0.88 0.90 0.94 0.94 0.86 0.92  CALCIUM 8.3*  --  8.1* 8.1* 8.0* 7.9*  MG   --   --  1.6  --   --   --    Liver Function Tests:  Recent Labs Lab 08/14/14 1725 08/16/14 1707 08/17/14 0500 08/18/14 0627 08/19/14 0355  AST 75* 53* 55* 55* 63*  ALT 34 25 25 27 27   ALKPHOS 121* 94 99 103 107  BILITOT 3.4* 2.3* 2.3* 2.5* 2.3*  PROT 7.1 5.3* 6.0 6.4 5.8*  ALBUMIN 2.9* 2.1* 2.2* 2.2* 2.2*   No results for input(s): LIPASE, AMYLASE in the last 168 hours.  Recent Labs Lab 08/17/14 0500  AMMONIA 27   CBC:  Recent Labs Lab 08/14/14 2209 08/14/14 2223 08/17/14 0500 08/18/14 0627 08/19/14 0355 08/20/14 0730  WBC 3.7*  --  2.6* 3.5* 3.7* 4.0  NEUTROABS 2.2  --   --   --   --   --   HGB 10.2* 10.9* 9.5* 10.0* 9.8* 9.7*  HCT 29.8* 32.0* 27.7* 29.0* 28.6* 27.8*  MCV 92.0  --  90.2 89.8 91.4 89.4  PLT 48*  --  42* 50* 54* 55*   Cardiac Enzymes: No results for input(s): CKTOTAL, CKMB, CKMBINDEX, TROPONINI in the last 168 hours. BNP (last 3 results)  Recent Labs  08/14/14 1725  BNP 121.5*    ProBNP (last 3 results)  Recent Labs  10/17/13 1324  PROBNP 121.1    CBG:  Recent Labs Lab 08/19/14 1623 08/19/14 2209 08/20/14 0641 08/20/14 1130 08/20/14 1645  GLUCAP 144* 130* 116* 140* 139*    Recent Results (from the past 240 hour(s))  MRSA PCR Screening     Status: None   Collection Time: 08/15/14  8:57 AM  Result Value Ref Range Status   MRSA by PCR NEGATIVE NEGATIVE Final    Comment:        The GeneXpert MRSA Assay (FDA approved for NASAL specimens only), is one component of a comprehensive MRSA colonization surveillance program. It is not intended to diagnose MRSA infection nor to guide or monitor treatment for MRSA infections.   Culture, Urine     Status: None   Collection Time: 08/16/14  5:50 PM  Result Value Ref Range Status   Specimen Description URINE, RANDOM  Final   Special Requests NONE  Final   Colony Count NO GROWTH Performed at [facility]   Final   Culture NO GROWTH Performed at [facility]   Final   Report Status 08/17/2014 FINAL  Final     Studies: No results found.  Scheduled Meds: . aspirin EC  81 mg Oral Daily  . atorvastatin  20 mg Oral Daily  . furosemide  40 mg Oral Daily  . insulin aspart  0-9 Units Subcutaneous TID WC  . levofloxacin  750 mg Oral Q24H  . propranolol  10 mg Oral BID  . spironolactone  100 mg Oral Daily   Continuous Infusions:   Active Problems:   TIA (transient ischemic attack)   Acute pulmonary edema   Right sided weakness   Sinusitis, chronic   CAP (community acquired pneumonia)   Insomnia    Time spent: 15  minutes    Garrett Turner  [facility] Pager [phone]. If 7PM-7AM, please contact night-coverage at [email or web address], password [facility] 08/20/2014, 6:28 PM  LOS: 6 days

## 2014-08-20 NOTE — Clinical Social Work Placement (Addendum)
Clinical Social Work Department CLINICAL SOCIAL WORK PLACEMENT NOTE 08/20/2014  Patient:  Garrett Turner, Garrett Turner  Account Number:  [id number] Admit date:  08/14/2014  Clinical Social Worker:  Glendon Axe, CLINICAL SOCIAL WORKER  Date/time:  08/20/2014 08:08 PM  Clinical Social Work is seeking post-discharge placement for this patient at the following level of care:   SKILLED NURSING   (*CSW will update this form in Epic as items are completed)   08/20/2014  Patient/family provided with [facility] Department of Clinical Social Work's list of facilities offering this level of care within the geographic area requested by the patient (or if unable, by the patient's family).  08/20/2014  Patient/family informed of their freedom to choose among providers that offer the needed level of care, that participate in Medicare, Medicaid or managed care program needed by the patient, have an available bed and are willing to accept the patient.  08/20/2014  Patient/family informed of [facility]' ownership interest in [facility], as well as of the fact that they are under no obligation to receive care at this facility.  PASARR submitted to EDS on 08/20/2014 PASARR number received on 08/20/2014  FL2 transmitted to all facilities in geographic area requested by pt/family on  08/20/2014 FL2 transmitted to all facilities within larger geographic area on   Patient informed that his/her managed care company has contracts with or will negotiate with  certain facilities, including the following:   YES     Patient/family informed of bed offers received: 08/21/2014   Patient chooses bed at [facility]  Physician recommends and patient chooses bed at    Patient to be transferred to [place]   on 08/24/2014 Patient to be transferred to facility by [facility]  Patient and family notified of transfer on 08/24/2014 Name of family member notified:  Pt's  dtr, [name]   The following physician request were entered in Epic:   Additional Comments:   Glendon Axe, MSW, [place] [phone] 08/20/2014 8:09 PM

## 2014-08-21 DIAGNOSIS — F4323 Adjustment disorder with mixed anxiety and depressed mood: Secondary | ICD-10-CM | POA: Diagnosis present

## 2014-08-21 LAB — GLUCOSE, CAPILLARY
GLUCOSE-CAPILLARY: 112 mg/dL — AB (ref 70–99)
Glucose-Capillary: 111 mg/dL — ABNORMAL HIGH (ref 70–99)
Glucose-Capillary: 114 mg/dL — ABNORMAL HIGH (ref 70–99)
Glucose-Capillary: 129 mg/dL — ABNORMAL HIGH (ref 70–99)

## 2014-08-21 MED ORDER — ALPRAZOLAM 0.25 MG PO TABS
0.2500 mg | ORAL_TABLET | Freq: Once | ORAL | Status: AC
  Administered 2014-08-21: 0.25 mg via ORAL
  Filled 2014-08-21: qty 1

## 2014-08-21 MED ORDER — ESCITALOPRAM OXALATE 10 MG PO TABS
10.0000 mg | ORAL_TABLET | Freq: Every day | ORAL | Status: DC
  Administered 2014-08-21 – 2014-08-23 (×3): 10 mg via ORAL
  Filled 2014-08-21 (×3): qty 1

## 2014-08-21 NOTE — Progress Notes (Signed)
Pt. Has a BP of 98/41 but is asymptomatic Dr. Sanjuana Letters notified

## 2014-08-21 NOTE — Progress Notes (Signed)
Pt. Has an order for a psych consult regarding suicidal ideation, and a sitter has been ordered, per Dr. Sanjuana Letters

## 2014-08-21 NOTE — Clinical Social Work Note (Signed)
Clinical Social Worker received consult in reference to pt being depressed. CSW encouraged RN to consult Psychiatry.  RN notified MD. CSW continues to follow pt for medical readiness and to facilitate pt's discharge.   Glendon Axe, MSW, LCSWA [phone] 08/21/2014 10:34 AM

## 2014-08-21 NOTE — Progress Notes (Signed)
Patient had some bright red blood in his stool and his H&H has been trending down for a couple of days, Dr. Sanjuana Letters notified

## 2014-08-21 NOTE — Progress Notes (Signed)
Patient has expressed that he has been depressed for the past 2 years since the loss of his wife and he feels helpless and ready to give up. Will add Lexapro and reassess.

## 2014-08-21 NOTE — Progress Notes (Signed)
I was notified by nursing regarding traces of blood seen on patient's bedsheets when he was being cleaned. However patient had a bowel movement after that and reportedly there was no frank blood in the stool. His hemoglobin has remained stable between 9.5 and 10g/dl. Would need to continue to monitor hemoglobin as patient's platelet count is 55 and he is on aspirin. Will check hemoglobin again in a.m. and if evidence of further bleeding would discontinue aspirin and give vitamin K.

## 2014-08-21 NOTE — Progress Notes (Addendum)
[facility] PROGRESS NOTE  Garrett Turner NOB:[id number] DOB: Oct 25, 1950 DOA: 08/14/2014 PCP: Lorayne Marek, MD  Summary Appreciate neurology/GI. Garrett Turner is a very pleasant 64 y/o male with DM2, HTN, with NASH cirrhosis ( sees Dr Ethlyn Daniels) who presented to [facility] for dyspnea and leg swelling and developed rt sided weakness and speech impairment while in the ED therefore transferred from [facility] post admission for stroke w/up. Head CT negative. No TPA given due to low platelets. Stroke workup including brain MRI/carotid Doppler/2-D echocardiogram unrevealing. However, his blood pressure has been on the low side and this may have been the primary cause of the transient symptoms in the ED. Question is underlying cause which could include medications/infection. He had paracentesis done by Dr. Michail Sermon and this did not suggest SBP. UA unremarkable . I discontinued Losartan for now in view of low blood pressure but continued propranolol and diuretics per GI. Ammonia level is normal. Patient was started on low-dose aspirin per neurology recommendations mindful of thrombocytopenia. He had CT chest which showed "Moderate left pleural effusion is noted which is slightly improved compared to prior exam. Stable multifocal airspace opacities are noted in both lungs which may represent pneumonia, subsegmental atelectasis or possibly scarring. Followup CT scan in 7 in 10 days is recommended to ensure resolution or stability these abnormalities. Stable paraseptal thickening is noted peripherally in both lungs which is most consistent with scarring, but edema cannot be excluded. Continued presence of hepatic cirrhosis with splenomegaly and moderate ascites". His shortness of breath has improved, particularly after antibiotics were started. He is on day 4/7 Levaquin for Pneumonia. Plans are for eventual discharge to SNF once bed becomes available. However, patient became severely depressed overnight, and he has  expressed thoughts of wanting to harm himself last night- "he was going to go to [employer] and jump over the bridge". However he says that he does not know how he would do it(harm himself) in the room. He would like [place] to leave him alone, and he does not know what he should do next. He tells me that he was on antidepressives for a while after his wife died 2 years ago as he felt hopeless, but he came off the meds. He feels anxious and would like something for sleep. I have started Lexapro pending Psych consultation. Will also request a bedside sitter. Will give patient one time dose of Xanax.  Plan ?TIA  On ASA/ lipitor. Follow Neuro recommendations  Cirrhosis of liver with ? Acute decompensation Defer to GI recommendations. Continue to hold losartan in view of low normal BP-maybe not resume it at all as BP borderline low.  Pulmonary edema/PNA Levaquin    Hx of gallstones Patient seen by surgery and given underlying cirrhosishave recommended to hold off on sx at this time.  DM type 2 last A1C of 6 on metformin as outpt. Monitor on SSI.  Rt upper lobe nodule 13 mm RUL nodule seen on CTA. Recommended follow up CT after resolution of interstitial infiltrate/ edema. Can be repeated in 4 -6 weeks as outpt  Depression Place on 1 on 1 Lexapro Psych Consultation.  DVT prophylaxis SCD  Diet  heart healthy    Code Status:full code Family Communication: Spoke  With daughter Carolyne Fiscal at [phone] and updated her. She doesn't think that patient can manage by himself at home as "he can't take care of himself". Disposition Plan: For short-term rehabilitation.   Consultants:  Neuro  GI  Procedures:  MRI brain  2 d  echo  Carotid Doppler  CT chest  Antibiotics:  Levaquin 08/18/2014>  HPI/Subjective: "I don't know what to do with myself". Says shortness of breath is better.  Objective: Filed Vitals:   08/21/14 1026  BP: 98/41  Pulse: 63  Temp: 99  F (37.2 C)  Resp: 20    Intake/Output Summary (Last 24 hours) at 08/21/14 1102 Last data filed at 08/21/14 0212  Gross per 24 hour  Intake      0 ml  Output    200 ml  Net   -200 ml   Filed Weights   08/15/14 0316  Weight: 84.687 kg (186 lb 11.2 oz)    Exam:   General:  Apathetic  Cardiovascular: RRR. S1S2 normal. No murmurs.  Respiratory: Good air entry bilaterally.  Abdomen: soft, non tender. Slightly distended.  Musculoskeletal: No pedal edema.  Data Reviewed: Basic Metabolic Panel:  Recent Labs Lab 08/14/14 1725 08/14/14 2223 08/17/14 0500 08/18/14 0627 08/19/14 0355 08/20/14 0730  NA 134* 137 136 134* 134* 133*  K 3.5 3.8 3.8 3.8 4.0 4.2  CL 106 105 105 103 105 106  CO2 21  --  27 27 24 23   GLUCOSE 105* 98 115* 113* 139* 115*  BUN 15 14 9 10 9 12   CREATININE 0.88 0.90 0.94 0.94 0.86 0.92  CALCIUM 8.3*  --  8.1* 8.1* 8.0* 7.9*  MG  --   --  1.6  --   --   --    Liver Function Tests:  Recent Labs Lab 08/14/14 1725 08/16/14 1707 08/17/14 0500 08/18/14 0627 08/19/14 0355  AST 75* 53* 55* 55* 63*  ALT 34 25 25 27 27   ALKPHOS 121* 94 99 103 107  BILITOT 3.4* 2.3* 2.3* 2.5* 2.3*  PROT 7.1 5.3* 6.0 6.4 5.8*  ALBUMIN 2.9* 2.1* 2.2* 2.2* 2.2*   No results for input(s): LIPASE, AMYLASE in the last 168 hours.  Recent Labs Lab 08/17/14 0500  AMMONIA 27   CBC:  Recent Labs Lab 08/14/14 2209 08/14/14 2223 08/17/14 0500 08/18/14 0627 08/19/14 0355 08/20/14 0730  WBC 3.7*  --  2.6* 3.5* 3.7* 4.0  NEUTROABS 2.2  --   --   --   --   --   HGB 10.2* 10.9* 9.5* 10.0* 9.8* 9.7*  HCT 29.8* 32.0* 27.7* 29.0* 28.6* 27.8*  MCV 92.0  --  90.2 89.8 91.4 89.4  PLT 48*  --  42* 50* 54* 55*   Cardiac Enzymes: No results for input(s): CKTOTAL, CKMB, CKMBINDEX, TROPONINI in the last 168 hours. BNP (last 3 results)  Recent Labs  08/14/14 1725  BNP 121.5*    ProBNP (last 3 results)  Recent Labs  10/17/13 1324  PROBNP 121.1     CBG:  Recent Labs Lab 08/19/14 2209 08/20/14 0641 08/20/14 1130 08/20/14 1645 08/20/14 2229  GLUCAP 130* 116* 140* 139* 122*    Recent Results (from the past 240 hour(s))  MRSA PCR Screening     Status: None   Collection Time: 08/15/14  8:57 AM  Result Value Ref Range Status   MRSA by PCR NEGATIVE NEGATIVE Final    Comment:        The GeneXpert MRSA Assay (FDA approved for NASAL specimens only), is one component of a comprehensive MRSA colonization surveillance program. It is not intended to diagnose MRSA infection nor to guide or monitor treatment for MRSA infections.   Culture, Urine     Status: None   Collection Time: 08/16/14  5:50 PM  Result Value Ref Range Status   Specimen Description URINE, RANDOM  Final   Special Requests NONE  Final   Colony Count NO GROWTH Performed at [facility]   Final   Culture NO GROWTH Performed at [facility]   Final   Report Status 08/17/2014 FINAL  Final     Studies: No results found.  Scheduled Meds: . ALPRAZolam  0.25 mg Oral Once  . aspirin EC  81 mg Oral Daily  . atorvastatin  20 mg Oral Daily  . escitalopram  10 mg Oral Daily  . furosemide  40 mg Oral Daily  . insulin aspart  0-9 Units Subcutaneous TID WC  . levofloxacin  750 mg Oral Q24H  . propranolol  10 mg Oral BID  . spironolactone  100 mg Oral Daily   Continuous Infusions:   Active Problems:   TIA (transient ischemic attack)   Acute pulmonary edema   Right sided weakness   Sinusitis, chronic   CAP (community acquired pneumonia)   Insomnia    Time spent: 20 minutes.    Trino Higinbotham  [facility] Pager [phone]. If 7PM-7AM, please contact night-coverage at [email or web address], password [facility] 08/21/2014, 11:02 AM  LOS: 7 days

## 2014-08-21 NOTE — Progress Notes (Signed)
PT Cancellation Note  Patient Details Name: Garrett Turner MRN: [id number] DOB: [birth year]   Cancelled Treatment:    Reason Eval/Treat Not Completed: Fatigue/lethargy limiting ability to participate. Patient stated that he was tired and felt "scared". RN made aware. Will reattempt tomorrow as schedule allows   Jacqualyn Posey 08/21/2014, 12:20 PM

## 2014-08-22 DIAGNOSIS — R188 Other ascites: Secondary | ICD-10-CM

## 2014-08-22 DIAGNOSIS — F4321 Adjustment disorder with depressed mood: Secondary | ICD-10-CM

## 2014-08-22 DIAGNOSIS — G458 Other transient cerebral ischemic attacks and related syndromes: Secondary | ICD-10-CM

## 2014-08-22 LAB — CBC
HEMATOCRIT: 29.1 % — AB (ref 39.0–52.0)
HEMOGLOBIN: 10.2 g/dL — AB (ref 13.0–17.0)
MCH: 31.6 pg (ref 26.0–34.0)
MCHC: 35.1 g/dL (ref 30.0–36.0)
MCV: 90.1 fL (ref 78.0–100.0)
Platelets: 49 10*3/uL — ABNORMAL LOW (ref 150–400)
RBC: 3.23 MIL/uL — AB (ref 4.22–5.81)
RDW: 16.6 % — AB (ref 11.5–15.5)
WBC: 3.5 10*3/uL — ABNORMAL LOW (ref 4.0–10.5)

## 2014-08-22 LAB — COMPREHENSIVE METABOLIC PANEL
ALBUMIN: 2.3 g/dL — AB (ref 3.5–5.2)
ALT: 26 U/L (ref 0–53)
ANION GAP: 4 — AB (ref 5–15)
AST: 62 U/L — ABNORMAL HIGH (ref 0–37)
Alkaline Phosphatase: 155 U/L — ABNORMAL HIGH (ref 39–117)
BILIRUBIN TOTAL: 2.5 mg/dL — AB (ref 0.3–1.2)
BUN: 9 mg/dL (ref 6–23)
CALCIUM: 8.1 mg/dL — AB (ref 8.4–10.5)
CO2: 25 mmol/L (ref 19–32)
Chloride: 105 mmol/L (ref 96–112)
Creatinine, Ser: 0.98 mg/dL (ref 0.50–1.35)
GFR calc Af Amer: >90 mL/min (ref >90)
GFR, EST NON AFRICAN AMERICAN: 84 mL/min — AB (ref >90)
GLUCOSE: 112 mg/dL — AB (ref 70–99)
Potassium: 4.3 mmol/L (ref 3.5–5.1)
SODIUM: 134 mmol/L — AB (ref 135–145)
TOTAL PROTEIN: 6.2 g/dL (ref 6.0–8.3)

## 2014-08-22 LAB — PROTIME-INR
INR: 1.73 — ABNORMAL HIGH (ref 0.00–1.49)
PROTHROMBIN TIME: 20.5 s — AB (ref 11.6–15.2)

## 2014-08-22 LAB — GLUCOSE, CAPILLARY
GLUCOSE-CAPILLARY: 121 mg/dL — AB (ref 70–99)
GLUCOSE-CAPILLARY: 115 mg/dL — AB (ref 70–99)
GLUCOSE-CAPILLARY: 144 mg/dL — AB (ref 70–99)
Glucose-Capillary: 109 mg/dL — ABNORMAL HIGH (ref 70–99)

## 2014-08-22 MED ORDER — BISACODYL 10 MG RE SUPP
10.0000 mg | Freq: Once | RECTAL | Status: AC
  Administered 2014-08-22: 10 mg via RECTAL
  Filled 2014-08-22: qty 1

## 2014-08-22 MED ORDER — HYDROCORTISONE 2.5 % RE CREA
TOPICAL_CREAM | Freq: Two times a day (BID) | RECTAL | Status: DC
  Administered 2014-08-22: 11:00:00 via TOPICAL
  Administered 2014-08-22: 1 via TOPICAL
  Administered 2014-08-23 – 2014-08-24 (×3): via TOPICAL
  Filled 2014-08-22: qty 28.35

## 2014-08-22 NOTE — Progress Notes (Signed)
Occupational Therapy Treatment Patient Details Name: Garrett Turner MRN: [id number] DOB: 25-Sep-1950 Today's Date: 08/22/2014    History of present illness Patient is a 64 y/o male who came to [facility] ED 08/15/2014 reporting mild diarrhea (after a 10-day course of Augmentin for cough productive of green sputum) and progressive SOB, DOE, swelling, and weakness.  Pt developed acute onset of right sided weakness and speech deficits after returning from CT scan of his chest. Code Stroke initiated and pt transferred to [facility]. CT and MRI head- unremarkable.  s/p paracentesis 1/28 2L.    OT comments  Pt exhibiting apparent signs of depression. Pt progressing towards acute OT goals. D/c plan remains appropriate.   Follow Up Recommendations  SNF    Equipment Recommendations  None recommended by OT    Recommendations for Other Services      Precautions / Restrictions Precautions Precautions: Fall Restrictions Weight Bearing Restrictions: No       Mobility Bed Mobility Overal bed mobility: Needs Assistance Bed Mobility: Supine to Sit     Supine to sit: Supervision;HOB elevated     General bed mobility comments: supervision for safety  Transfers Overall transfer level: Needs assistance Equipment used: Rolling walker (2 wheeled) Transfers: Sit to/from [employer] Transfers Sit to Stand: Supervision         General transfer comment: cues for technique with rw    Balance                                   ADL Overall ADL's : Needs assistance/impaired     Grooming: Oral care;Standing;Supervision/safety Grooming Details (indicate cue type and reason): able to use R hand to unscrew lid of toothpaste                             Functional mobility during ADLs: Supervision/safety;Rolling walker General ADL Comments: Pt able to use dominant R hand to assist with basic ADLs. Performed oral care standing at sink. Cues for safe technique needed for transfers  and functional mobility with rw at supervision-minguard level. Provided HEP for strengthening R hand with resistive ball exercises. Encouraged pt to continue with mobility and activity to promote independence and positive sense of wellbeing.       Vision                     Perception     Praxis      Cognition   Behavior During Therapy: Anxious;Flat affect Overall Cognitive Status: Within Functional Limits for tasks assessed                       Extremity/Trunk Assessment               Exercises     Shoulder Instructions       General Comments  Discussed SNF plan with pt with pt agreeable. Pt did express that wife had a bad experience in a SNF and he is worried about what his experience will be like.    Pertinent Vitals/ Pain       Pain Assessment: No/denies pain  Home Living                                          Prior  Functioning/Environment              Frequency Min 2X/week     Progress Toward Goals  OT Goals(current goals can now be found in the care plan section)  Progress towards OT goals: Progressing toward goals  Acute Rehab OT Goals Patient Stated Goal: maybe to rehab before home OT Goal Formulation: With patient Time For Goal Achievement: 08/25/14 Potential to Achieve Goals: Good ADL Goals Pt Will Perform Grooming: with set-up;with supervision;standing Pt Will Transfer to Toilet: with supervision;ambulating;grab bars Pt Will Perform Toileting - Clothing Manipulation and hygiene: with supervision;sit to/from stand Pt/caregiver will Perform Home Exercise Program: Increased ROM;Increased strength;Right Upper extremity;Independently;With written HEP provided;With theraband;With theraputty  Plan Discharge plan remains appropriate    Co-evaluation                 End of Session Equipment Utilized During Treatment: Gait belt;Rolling walker   Activity Tolerance Patient limited by fatigue   Patient  Left in chair;with call bell/phone within reach;with nursing/sitter in room   Nurse Communication          Time: 1000-1025 OT Time Calculation (min): 25 min  Charges: OT General Charges $OT Visit: 1 Procedure OT Treatments $Self Care/Home Management : 23-37 mins  Hortencia Pilar 08/22/2014, 12:51 PM

## 2014-08-22 NOTE — Progress Notes (Signed)
Chaplain followed up with pt. Pt reports that he is "ready for the next step" at SNF, but he is waiting on psych consult. Chaplain facilitated storytelling and offered prayer at pt request.    08/22/14 1600  Clinical Encounter Type  Visited With Patient  Visit Type Follow-up;Spiritual support  Referral From Social work  Spiritual Encounters  Spiritual Needs Prayer;Emotional  Stress Factors  Patient Stress Factors Major life changes  Garrett Turner 08/22/2014 4:24 PM

## 2014-08-22 NOTE — Progress Notes (Addendum)
[facility] PROGRESS NOTE  Garrett Turner EXH:[id number] DOB: [birth year] DOA: 08/14/2014 PCP: Lorayne Marek, MD  Summary Garrett Turner is a very pleasant 63 y/o male with DM2, HTN, with NASH cirrhosis ( sees Dr Ethlyn Daniels) who presented to [facility] for dyspnea and leg swelling and developedrt sided weakness and speech impairment while in the ED therefore transferred from [facility] to [facility] for stroke w/up. Head CT negative. No TPA given due to low platelets. Stroke workup including brain MRI/carotid Doppler/2-D echocardiogram unrevealing. However, his blood pressure has been on the low side and this may have been the primary cause of the transient symptoms in the ED. Question is underlying cause which could include medications/infection.   He had paracentesis done by Dr. Michail Sermon and this did not suggest SBP. UA unremarkable .  Dr Sanjuana Letters discontinued Losartan in view of low blood pressure but continued propranolol and diuretics per GI. Ammonia level is normal. Patient was started on low-dose aspirin per neurology recommendations mindful of thrombocytopenia.   He had CT chest which showed "Moderate left pleural effusion is noted which is slightly improved compared to prior exam. Stable multifocal airspace opacities are noted in both lungs which may represent pneumonia, subsegmental atelectasis or possibly scarring. Followup CT scan in 7 in 10 days is recommended to ensure resolution or stability these abnormalities. Stable paraseptal thickening is noted peripherally in both lungs which is most consistent with scarring, but edema cannot be excluded. Continued presence of hepatic cirrhosis with splenomegaly and moderate ascites".    Patient expressed severe depression on 2/2 and is awaiting a pscy eval- Lexapro has been started. On speaking with daughter, she notes he has had on and off depression in the past.   Plans are for discharge to SNF tomorrow.    Plan  Possible TIA vs neurological symptoms due to  hypotension On ASA/ Lipitor per Neuro-Neuro is aware that platelets are low  Cirrhosis of liver (NASH) with ascites and elevated LFTs and  INR - s/p paracentesis- 2 L removed on 1/28 - note that he is on Propranolol and diuretics now. - no encephalopathy noted - recommend Daily weights at SNF F/u with Dr Paulita Fujita in 2 wks Bmet in 1 wk to ensure he is not being overdiuresed  Pneumonia- CAP Levaquin -today is day 5 - will stop after today  Depression - expressed this on 2/2- started on Lexapro- d/c sitter as he has not intentions to commit suicide currently- psych consult pending  Rash on face - states his dermatologist prescribed hydrocortisone 2.5 %- not available in hospital- pharmacy suggesting to bring it from home- they will discuss with him  Hx of gallstones Patient seen by surgery and given underlying cirrhosishave recommended to hold off on sx at this time.  DM type 2 last A1C of 6 on metformin as outpt which can be continued as long as Cr < 1.5  Constipation - give Dulcolax suppository today  Rt upper lobe nodule/Left Pleural effusion 13 mm RUL nodule seen on CTA. - repeat CT in 7-10 days from 1/29 per radiology recommendations   DVT prophylaxis SCD   Code Status:full code Family Communication: I spoke with daughter Carolyne Fiscal at [phone] today and updated her. She is attempting to get POA Disposition Plan: For short-term rehabilitation. D/c sitter- will need to be 24 hr without sitter prior to going to SNF tomorrow.    Consultants:  Neuro  GI  Procedures:  MRI brain  2 d echo  Carotid Doppler  CT chest  Antibiotics:  Levaquin 08/18/2014>08/22/14  HPI/Subjective: Getting up with PT- feels depressed- no intentions for self harm. No c/o pain. Had only a small BM yesterday after 6 days.   Objective: Filed Vitals:   08/22/14 1000  BP: 112/70  Pulse: 61  Temp: 98.4 F (36.9 C)  Resp: 18    Intake/Output Summary (Last 24 hours) at  08/22/14 1022 Last data filed at 08/21/14 1100  Gross per 24 hour  Intake      0 ml  Output    225 ml  Net   -225 ml   Filed Weights   08/15/14 0316  Weight: 84.687 kg (186 lb 11.2 oz)    Exam:   General:  Alert, no distress  Cardiovascular: RRR. S1S2 normal. No murmurs.  Respiratory: Good air entry bilaterally.  Abdomen: soft, non tender. Slightly distended.  Musculoskeletal: No pedal edema.  Data Reviewed: Basic Metabolic Panel:  Recent Labs Lab 08/17/14 0500 08/18/14 0627 08/19/14 0355 08/20/14 0730 08/22/14 0614  NA 136 134* 134* 133* 134*  K 3.8 3.8 4.0 4.2 4.3  CL 105 103 105 106 105  CO2 27 27 24 23 25   GLUCOSE 115* 113* 139* 115* 112*  BUN 9 10 9 12 9   CREATININE 0.94 0.94 0.86 0.92 0.98  CALCIUM 8.1* 8.1* 8.0* 7.9* 8.1*  MG 1.6  --   --   --   --    Liver Function Tests:  Recent Labs Lab 08/16/14 1707 08/17/14 0500 08/18/14 0627 08/19/14 0355 08/22/14 0614  AST 53* 55* 55* 63* 62*  ALT 25 25 27 27 26   ALKPHOS 94 99 103 107 155*  BILITOT 2.3* 2.3* 2.5* 2.3* 2.5*  PROT 5.3* 6.0 6.4 5.8* 6.2  ALBUMIN 2.1* 2.2* 2.2* 2.2* 2.3*   No results for input(s): LIPASE, AMYLASE in the last 168 hours.  Recent Labs Lab 08/17/14 0500  AMMONIA 27   CBC:  Recent Labs Lab 08/17/14 0500 08/18/14 0627 08/19/14 0355 08/20/14 0730 08/22/14 0614  WBC 2.6* 3.5* 3.7* 4.0 3.5*  HGB 9.5* 10.0* 9.8* 9.7* 10.2*  HCT 27.7* 29.0* 28.6* 27.8* 29.1*  MCV 90.2 89.8 91.4 89.4 90.1  PLT 42* 50* 54* 55* 49*   Cardiac Enzymes: No results for input(s): CKTOTAL, CKMB, CKMBINDEX, TROPONINI in the last 168 hours. BNP (last 3 results)  Recent Labs  08/14/14 1725  BNP 121.5*    ProBNP (last 3 results)  Recent Labs  10/17/13 1324  PROBNP 121.1    CBG:  Recent Labs Lab 08/21/14 0653 08/21/14 1208 08/21/14 1622 08/21/14 2202 08/22/14 0656  GLUCAP 111* 114* 112* 129* 109*    Recent Results (from the past 240 hour(s))  MRSA PCR Screening      Status: None   Collection Time: 08/15/14  8:57 AM  Result Value Ref Range Status   MRSA by PCR NEGATIVE NEGATIVE Final    Comment:        The GeneXpert MRSA Assay (FDA approved for NASAL specimens only), is one component of a comprehensive MRSA colonization surveillance program. It is not intended to diagnose MRSA infection nor to guide or monitor treatment for MRSA infections.   Culture, Urine     Status: None   Collection Time: 08/16/14  5:50 PM  Result Value Ref Range Status   Specimen Description URINE, RANDOM  Final   Special Requests NONE  Final   Colony Count NO GROWTH Performed at [facility]   Final   Culture NO GROWTH Performed at [facility]  Final   Report Status 08/17/2014 FINAL  Final     Studies: No results found.  Scheduled Meds: . aspirin EC  81 mg Oral Daily  . atorvastatin  20 mg Oral Daily  . bisacodyl  10 mg Rectal Once  . escitalopram  10 mg Oral Daily  . furosemide  40 mg Oral Daily  . insulin aspart  0-9 Units Subcutaneous TID WC  . levofloxacin  750 mg Oral Q24H  . propranolol  10 mg Oral BID  . spironolactone  100 mg Oral Daily   Continuous Infusions:      Time spent: 20 minutes.    [facility]  [facility]  If 7PM-7AM, please contact night-coverage at [email or web address], password [facility] 08/22/2014, 10:22 AM  LOS: 8 days

## 2014-08-22 NOTE — Progress Notes (Signed)
Pt takes hydrocortisone lotion/cream for rash on face.  He does not like greasy ointments. Plan: anusol HC = hydrocortisone cream 2.5 % bid to face.  [name], Pharm.D. [phone] 08/22/2014 10:36 AM

## 2014-08-22 NOTE — Progress Notes (Signed)
Pt's daughter requested to speak with Md. Text paged Md to notify.

## 2014-08-22 NOTE — Progress Notes (Signed)
Noted red rash to face. Md paged to notify. Pt denies itch. Will monitor.

## 2014-08-22 NOTE — Progress Notes (Signed)
Physical Therapy Treatment Patient Details Name: Andersson Larrabee MRN: [id number] DOB: May 05, 1951 Today's Date: 08/22/2014    History of Present Illness Patient is a 64 y/o male who came to [facility] ED 08/15/2014 after being referred by his gastroenterologist reporting mild diarrhea (after a 10-day course of Augmentin for cough productive of green sputum) and progressive SOB, DOE, swelling, and weakness.  The patient was sent to the ER for eval of the SOB and developed acute onset of right sided weakness and speech deficits after returning from CT scan of his chest. Code Stroke initiated and pt transferred to [facility]. CT and MRI head- unremarkable.  s/p paracentesis 1/28 2L.     PT Comments    Pt. Had a flat affect today but was agreeable to therapy. Has significant risk for falls and needs assistance from AD while mobile. Would benefit from balance training upon d/c to SNF.   Follow Up Recommendations  SNF;Supervision/Assistance - 24 hour     Equipment Recommendations  None recommended by PT    Recommendations for Other Services OT consult     Precautions / Restrictions Precautions Precautions: Fall Restrictions Weight Bearing Restrictions: No    Mobility  Bed Mobility Overal bed mobility: Needs Assistance Bed Mobility: Sit to Supine     Supine to sit: Supervision;HOB elevated     General bed mobility comments: in chair on arrival; then at the end supervison for safety  Transfers Overall transfer level: Needs assistance Equipment used: Rolling walker (2 wheeled);None Transfers: Sit to/from [facility] Sit to Stand: Min guard         General transfer comment: min g uncontrolled descent.  Ambulation/Gait Ambulation/Gait assistance: Supervision Ambulation Distance (Feet): 5 Feet Assistive device: None Gait Pattern/deviations: Step-through pattern;Trunk flexed   Gait velocity interpretation: at or above normal speed for age/gender General Gait Details: pt.  just went to bathroom in room   Stairs            Wheelchair Mobility    Modified Rankin (Stroke Patients Only)       Balance                                    Cognition Arousal/Alertness: Awake/alert Behavior During Therapy: Flat affect Overall Cognitive Status: Within Functional Limits for tasks assessed                      Exercises      General Comments General comments (skin integrity, edema, etc.): redness around face particularly lips, pharmacist was in during session will be addressed with medication      Pertinent Vitals/Pain Pain Assessment: No/denies pain    Home Living                      Prior Function            PT Goals (current goals can now be found in the care plan section) Acute Rehab PT Goals Patient Stated Goal: maybe to rehab before home Progress towards PT goals: Progressing toward goals    Frequency  Min 3X/week    PT Plan Current plan remains appropriate    Co-evaluation             End of Session Equipment Utilized During Treatment: Gait belt Activity Tolerance: Patient tolerated treatment well Patient left: in bed;with bed alarm set;with nursing/sitter in room     Time:  6213-0865 PT Time Calculation (min) (ACUTE ONLY): 43 min  Charges:                       G Codes:      Jodi Geralds, SPTA 08/22/2014, 11:15 AM

## 2014-08-23 DIAGNOSIS — J90 Pleural effusion, not elsewhere classified: Secondary | ICD-10-CM

## 2014-08-23 DIAGNOSIS — F4323 Adjustment disorder with mixed anxiety and depressed mood: Secondary | ICD-10-CM

## 2014-08-23 LAB — GLUCOSE, CAPILLARY
GLUCOSE-CAPILLARY: 121 mg/dL — AB (ref 70–99)
GLUCOSE-CAPILLARY: 178 mg/dL — AB (ref 70–99)
GLUCOSE-CAPILLARY: 211 mg/dL — AB (ref 70–99)
Glucose-Capillary: 99 mg/dL (ref 70–99)

## 2014-08-23 MED ORDER — DIPHENHYDRAMINE-ZINC ACETATE 2-0.1 % EX CREA
TOPICAL_CREAM | Freq: Three times a day (TID) | CUTANEOUS | Status: DC | PRN
  Filled 2014-08-23: qty 28

## 2014-08-23 MED ORDER — ESCITALOPRAM OXALATE 10 MG PO TABS
20.0000 mg | ORAL_TABLET | Freq: Every day | ORAL | Status: DC
  Administered 2014-08-24: 20 mg via ORAL
  Filled 2014-08-23: qty 2

## 2014-08-23 MED ORDER — MIRTAZAPINE 15 MG PO TBDP
15.0000 mg | ORAL_TABLET | Freq: Every day | ORAL | Status: DC
  Administered 2014-08-23: 15 mg via ORAL
  Filled 2014-08-23 (×3): qty 1

## 2014-08-23 MED ORDER — ZOLPIDEM TARTRATE 5 MG PO TABS
5.0000 mg | ORAL_TABLET | Freq: Every evening | ORAL | Status: DC | PRN
  Administered 2014-08-23: 5 mg via ORAL
  Filled 2014-08-23: qty 1

## 2014-08-23 NOTE — Progress Notes (Signed)
[facility] PROGRESS NOTE  Garrett Turner DTO:[id number] DOB: [birth year] DOA: 08/14/2014 PCP: Lorayne Marek, MD  Assessment/Plan: 64 y/o male with PMH of DM2, HTN, with NASH cirrhosis ( sees Dr Ethlyn Daniels) who presented to [facility] for dyspnea and leg swelling and developedrt sided weakness and speech impairment while in the ED therefore transferred from [facility] to [facility] for stroke w/up.  -Head CT negative. No TPA given due to low platelets. Stroke workup including brain MRI/carotid Doppler/2-D echocardiogram unrevealing. However, his blood pressure has been on the low side and this may have been the primary cause of the transient symptoms in the ED. Question is underlying cause which could include medications/infection.   -He had paracentesis done by Dr. Michail Sermon and this did not suggest SBP. UA unremarkable . Dr Sanjuana Letters discontinued Losartan in view of low blood pressure but continued propranolol and diuretics per GI. Ammonia level is normal. Patient was started on low-dose aspirin per neurology recommendations mindful of thrombocytopenia.   He had CT chest which showed "Moderate left pleural effusion is noted which is slightly improved compared to prior exam. Stable multifocal airspace opacities are noted in both lungs which may represent pneumonia, subsegmental atelectasis or possibly scarring. Followup CT scan in 7 in 10 days is recommended to ensure resolution or stability these abnormalities. Stable paraseptal thickening is noted peripherally in both lungs which is most consistent with scarring, but edema cannot be excluded. Continued presence of hepatic cirrhosis with splenomegaly and moderate ascites".   -Patient expressed severe depression on 2/2 Lexapro. Remeron per psychiatry evalution;    1. Possible TIA vs neurological symptoms due to hypotension; MRI head: no acute infarcts;  -On ASA/ Lipitor per Neuro-Neuro is aware that platelets are low; d/w patient, needs close monitoring for s/s of  bleeding;   2. Cirrhosis of liver (NASH) with ascites and elevated LFTs and INR - s/p paracentesis- 2 L removed on 1/28 - note that he is on Propranolol and diuretics now. - no encephalopathy noted - recommend Daily weights at SNF; F/u with Dr Paulita Fujita in 2 wks; Bmet in 1 wk to ensure he is not being over diuresed -avoid benzo  3. Pneumonia- CAP Levaquin completed  4. Depression, h/o SI; currently denies suicidal ideations or homicidal ideations  -patient was evaluated by psychiatry, started on lexapro, Remeron;  5. Rash on face - states his dermatologist prescribed hydrocortisone 2.5 %- not available in hospital- pharmacy suggesting to bring it from home- they will discuss with him 6. Hx of gallstones -Patient seen by surgery and given underlying cirrhosishave recommended to hold off on sx at this time. 7. DM type 2last A1C of 6 -on metformin as outpt which can be continued as long as Cr < 1.5 8. Rt upper lobe nodule/Left Pleural effusion -13 mm RUL nodule seen on CTA. - repeat CT in 7-10 days from 1/29 per radiology recommendations   DVT prophylaxis SCD  Code Status: full  Family Communication: d/w patient, updated his daughter at [phone]  (indicate person spoken with, relationship, and if by phone, the number) Disposition Plan: SNF on 2/5  Consultants:  Neuro  GI  Procedures:  MRI brain  2 d echo  Carotid Doppler  CT chest  Antibiotics:  Levaquin 08/18/2014>08/23/14 HPI/Subjective: Alert, oriented   Objective: Filed Vitals:   08/23/14 0959  BP: 92/40  Pulse: 67  Temp: 98.2 F (36.8 C)  Resp: 18   No intake or output data in the 24 hours ending 08/23/14 1247 Filed Weights   08/15/14 0316  Weight: 84.687 kg (186 lb 11.2 oz)    Exam:   General:  alert  Cardiovascular: s1,s2 rrr  Respiratory: CTA BL  Abdomen: soft, nt, nd  Musculoskeletal: no LE edema   Data Reviewed: Basic Metabolic Panel:  Recent Labs Lab 08/17/14 0500  08/18/14 0627 08/19/14 0355 08/20/14 0730 08/22/14 0614  NA 136 134* 134* 133* 134*  K 3.8 3.8 4.0 4.2 4.3  CL 105 103 105 106 105  CO2 27 27 24 23 25   GLUCOSE 115* 113* 139* 115* 112*  BUN 9 10 9 12 9   CREATININE 0.94 0.94 0.86 0.92 0.98  CALCIUM 8.1* 8.1* 8.0* 7.9* 8.1*  MG 1.6  --   --   --   --    Liver Function Tests:  Recent Labs Lab 08/16/14 1707 08/17/14 0500 08/18/14 0627 08/19/14 0355 08/22/14 0614  AST 53* 55* 55* 63* 62*  ALT 25 25 27 27 26   ALKPHOS 94 99 103 107 155*  BILITOT 2.3* 2.3* 2.5* 2.3* 2.5*  PROT 5.3* 6.0 6.4 5.8* 6.2  ALBUMIN 2.1* 2.2* 2.2* 2.2* 2.3*   No results for input(s): LIPASE, AMYLASE in the last 168 hours.  Recent Labs Lab 08/17/14 0500  AMMONIA 27   CBC:  Recent Labs Lab 08/17/14 0500 08/18/14 0627 08/19/14 0355 08/20/14 0730 08/22/14 0614  WBC 2.6* 3.5* 3.7* 4.0 3.5*  HGB 9.5* 10.0* 9.8* 9.7* 10.2*  HCT 27.7* 29.0* 28.6* 27.8* 29.1*  MCV 90.2 89.8 91.4 89.4 90.1  PLT 42* 50* 54* 55* 49*   Cardiac Enzymes: No results for input(s): CKTOTAL, CKMB, CKMBINDEX, TROPONINI in the last 168 hours. BNP (last 3 results)  Recent Labs  08/14/14 1725  BNP 121.5*    ProBNP (last 3 results)  Recent Labs  10/17/13 1324  PROBNP 121.1    CBG:  Recent Labs Lab 08/22/14 1149 08/22/14 1641 08/22/14 2154 08/23/14 0700 08/23/14 1135  GLUCAP 121* 115* 144* 121* 178*    Recent Results (from the past 240 hour(s))  MRSA PCR Screening     Status: None   Collection Time: 08/15/14  8:57 AM  Result Value Ref Range Status   MRSA by PCR NEGATIVE NEGATIVE Final    Comment:        The GeneXpert MRSA Assay (FDA approved for NASAL specimens only), is one component of a comprehensive MRSA colonization surveillance program. It is not intended to diagnose MRSA infection nor to guide or monitor treatment for MRSA infections.   Culture, Urine     Status: None   Collection Time: 08/16/14  5:50 PM  Result Value Ref Range Status    Specimen Description URINE, RANDOM  Final   Special Requests NONE  Final   Colony Count NO GROWTH Performed at [facility]   Final   Culture NO GROWTH Performed at [facility]   Final   Report Status 08/17/2014 FINAL  Final     Studies: No results found.  Scheduled Meds: . aspirin EC  81 mg Oral Daily  . atorvastatin  20 mg Oral Daily  . [START ON 08/24/2014] escitalopram  20 mg Oral Daily  . furosemide  40 mg Oral Daily  . hydrocortisone   Topical BID  . insulin aspart  0-9 Units Subcutaneous TID WC  . mirtazapine  15 mg Oral QHS  . propranolol  10 mg Oral BID  . spironolactone  100 mg Oral Daily   Continuous Infusions:   Principal Problem:   Adjustment disorder with mixed anxiety  and depressed mood Active Problems:   Acute pulmonary edema   Right sided weakness   Sinusitis, chronic   CAP (community acquired pneumonia)   Insomnia    Time spent: >35 minutes     Kinnie Feil  [facility] Pager [phone]. If 7PM-7AM, please contact night-coverage at [email or web address], password [facility] 08/23/2014, 12:47 PM  LOS: 9 days

## 2014-08-23 NOTE — Progress Notes (Signed)
Left a message for Clinical Psych SW, [name]. Pt pending psych consult.

## 2014-08-23 NOTE — Progress Notes (Signed)
Spoke with [name], Clinical SW who gave the number to get in touch with Behavioral Health in reference to a psych consult. Called spoke with assessment department and gave information for pt to have psych consult per MD order. Pt stated this morning that he has been struggling with depression lately off and on and really needs to speak with someone. He says that he has no intention on harming himself at this time. Will continue to monitor.

## 2014-08-23 NOTE — Clinical Social Work Note (Signed)
Clinical Social Worker notified by attending MD that Psychiatry would like for patient to remain in hospital for another day. CSW to contact [place] with updates.   CSW continues to follow pt for continued support and to facilitate pt's discharge needs once medically stable.   FL-2 on chart for MD siganture.  Glendon Axe, MSW, LCSWA [phone] 08/23/2014 11:17 AM

## 2014-08-23 NOTE — Consult Note (Signed)
[facility] Face-to-Face Psychiatry Consult   Reason for Consult:  Depression and anxiety Referring Physician:  Dr. Daleen Bo  Patient Identification: Garrett Turner MRN:  [id number] Principal Diagnosis: Adjustment disorder with mixed anxiety and depressed mood Diagnosis:   Patient Active Problem List   Diagnosis Date Noted  . Adjustment disorder with mixed anxiety and depressed mood [F43.23] 08/21/2014  . Insomnia [G47.00] 08/19/2014  . CAP (community acquired pneumonia) [J18.9] 08/18/2014  . Sinusitis, chronic [J32.9] 08/16/2014  . Acute CVA (cerebrovascular accident) [I63.9] 08/15/2014  . Edema [R60.9]   . Acute pulmonary edema [J81.0]   . Right sided weakness [M62.89]   . Anemia, unspecified [D64.9] 02/14/2014  . Type II or unspecified type diabetes mellitus with neurological manifestations, not stated as uncontrolled(250.60) [E11.49] 04/24/2013  . Left leg cellulitis [U31.497] 04/13/2012  . Lactic acid acidosis [E87.2] 04/13/2012  . Sepsis(995.91) [A41.9] 04/13/2012  . GERD (gastroesophageal reflux disease) [K21.9] 04/13/2012  . Cirrhosis of liver [K74.60] 04/13/2012  . Thrombocytopenia [D69.6] 04/13/2012  . Depression with anxiety [F41.8] 04/13/2012  . Bilateral inguinal hernia-containing fat and ascites [K40.20] 09/11/2011  . UTI (lower urinary tract infection) [N39.0] 06/16/2011  . Intractable pain [R52] 06/14/2011  . Fall [W19.XXXA] 06/14/2011  . DM (diabetes mellitus) [E11.9] 06/14/2011  . HTN (hypertension) [I10] 06/14/2011  . Hyperlipidemia [E78.5] 06/14/2011  . Cirrhosis [K74.60] 06/14/2011    Total Time spent with patient: 45 minutes  Subjective:   Garrett Turner is a 64 y.o. male patient admitted with DKA and depression.  HPI: Garrett Turner is a 64 years old male seen, reviewed available medical records and case discussed with hospitalist for psychiatric consultation and evaluation of increased symptoms of depression and anxiety. Patient reported he has been  suffering with a significant symptoms of depression, sadness, disturbance of sleep and appetite, isolation, poor socialization, feeling helpless and worthless. Patient also has passive suicidal ideation. 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 Patient also had multiple part-time works here and there before become unemployed. Patient has a history of depression/nervous breakdown about 10 years ago and seen a psychiatrist in the local community. Patient endorses passive suicidal ideation but denies active intent or plan. Patient has the daughter with 3 children and a couple of friends who is a support system. Patient reportedly suffering with the depression and spiraling down the hill without real social connections over 2 years. Patient believes in the lesion consider himself as a [employer] but does not actively participate in church activities. Patient is willing to participate in medication management. Patient stated that he was not ready to move out of the hospital both physically and mentally today and requested additional time to stay in hospital.  Medical history: 64 yo male no known med hx presented to [facility] ED 1/17 after found to be unresponsive. Was hypothermic, tachypneic, tachycardic, severe sepsis, b/l PNA, DKA with severe metabolic acidosis. Was intubated in ED. Thought to have developed DKA >> encephalopathy >> asp pna >> sepsis. Was placed on insulin gtt, abx, and pulm and nephro consulted. hgba1c 13.2. AKI failed to improve with IV fluids. CT head done for ams., neg. Had high anion gap acidosis which resolved. Now has NAGAcidosis. Also had anemia and thrombocytopenia. Per discussion with family today, she was doing well before Saturday. On Saturday 1/16 she was having some cough and cold symptoms, her brother was sick who lives next to her. Then on Sunday she was walking back from her bathroom and was  found down on the floor in her house by  her brother who was incidentally checking on her. Has hx of diabetes per sister but was not taking any medication per sisters. No other medical problems.   HPI Elements:  Location:  Depression. Quality:  Poor . Severity:  Moderate to severe. Timing:  Loss of his wife. Duration:  2 years ago. Context:  Worsening personal care and neglect of medical care.  Past Medical History:  Past Medical History  Diagnosis Date  . Diabetes mellitus   . Hypertension   . High cholesterol   . Anxiety disorder   . Pancytopenia   . Inguinal hernia     Left  . Cirrhosis   . UTI (lower urinary tract infection)   . Depression   . Anxiety   . Leg cramps     Past Surgical History  Procedure Laterality Date  . Tonsillectomy    . Esophagogastroduodenoscopy  11/04/2011    Procedure: ESOPHAGOGASTRODUODENOSCOPY (EGD);  Surgeon: Arta Silence, MD;  Location: [facility];  Service: Endoscopy;  Laterality: N/A;  . Colonoscopy  11/04/2011    Procedure: COLONOSCOPY;  Surgeon: Arta Silence, MD;  Location: [facility];  Service: Endoscopy;  Laterality: N/A;  . Vasectomy  1985   Family History:  Family History  Problem Relation Age of Onset  . Coronary artery disease    . Hypertension    . Heart failure Father   . Hypertension Father   . Heart disease Father   . Heart failure Mother   . Hypertension Mother   . Heart disease Mother   . Stroke Paternal Grandmother   . Obesity Daughter    Social History:  History  Alcohol Use  . 1.1 oz/week  . 1 Glasses of wine, 1 Not specified per week     History  Drug Use No    History   Social History  . Marital Status: Married    Spouse Name: N/A    Number of Children: N/A  . Years of Education: N/A   Social History Main Topics  . Smoking status: Never Smoker   . Smokeless tobacco: Former Systems developer  . Alcohol Use: 1.1 oz/week    1 Glasses of wine, 1 Not specified per week  . Drug Use: No  . Sexual Activity: Not Currently   Other Topics Concern  .  None   Social History Narrative   Additional Social History:       Allergies:  No Known Allergies  Vitals: Blood pressure 111/80, pulse 79, temperature 98.2 F (36.8 C), temperature source Oral, resp. rate 16, height 6\' 1"  (1.854 m), weight 84.687 kg (186 lb 11.2 oz), SpO2 99 %.  Risk to Self: Is patient at risk for suicide?: No Risk to Others:   Prior Inpatient Therapy:   Prior Outpatient Therapy:    Current Facility-Administered Medications  Medication Dose Route Frequency Provider Last Rate Last Dose  . aspirin EC tablet 81 mg  81 mg Oral Daily Donzetta Starch, NP   81 mg at 08/22/14 1000  . atorvastatin (LIPITOR) tablet 20 mg  20 mg Oral Daily Eber Jones, MD   20 mg at 08/22/14 1000  . diphenhydrAMINE (BENADRYL) capsule 25 mg  25 mg Oral Q6H PRN Ritta Slot, NP      . escitalopram (LEXAPRO) tablet 10 mg  10 mg Oral Daily Simbiso Ranga, MD   10 mg at 08/22/14 1000  . furosemide (LASIX) tablet 40 mg  40 mg Oral Daily  Lear Ng, MD   40 mg at 08/22/14 1000  . hydrocortisone (ANUSOL-HC) 2.5 % rectal cream   Topical BID [name], [facility]   1 application at 37/16/96 2142  . insulin aspart (novoLOG) injection 0-9 Units  0-9 Units Subcutaneous TID WC Eber Jones, MD   1 Units at 08/23/14 [phone]  . morphine 2 MG/ML injection 1 mg  1 mg Intravenous Q3H PRN Simbiso Ranga, MD   1 mg at 08/16/14 1244  . propranolol (INDERAL) tablet 10 mg  10 mg Oral BID Eber Jones, MD   10 mg at 08/22/14 2141  . spironolactone (ALDACTONE) tablet 100 mg  100 mg Oral Daily Lear Ng, MD   100 mg at 08/22/14 1000  . traMADol (ULTRAM) tablet 50 mg  50 mg Oral Q6H PRN Simbiso Ranga, MD   50 mg at 08/16/14 1048  . traZODone (DESYREL) tablet 50 mg  50 mg Oral QHS PRN Eber Jones, MD      . zolpidem [facility]) tablet 5 mg  5 mg Oral QHS PRN Nat Math, MD   5 mg at 08/22/14 2142    Musculoskeletal: Strength & Muscle Tone: decreased Gait & Station:  unable to stand Patient leans: N/A  Psychiatric Specialty Exam: Physical Exam as per history and physical   ROS depression, anxiety, decreased psychomotor activity and concentration   Blood pressure 111/80, pulse 79, temperature 98.2 F (36.8 C), temperature source Oral, resp. rate 16, height 6\' 1"  (1.854 m), weight 84.687 kg (186 lb 11.2 oz), SpO2 99 %.Body mass index is 24.64 kg/(m^2).  General Appearance: Guarded  Eye Contact::  Good  Speech:  Clear and Coherent and Slow  Volume:  Decreased  Mood:  Anxious, Depressed and Worthless  Affect:  Congruent and Depressed  Thought Process:  Coherent and Goal Directed  Orientation:  Full (Time, Place, and Person)  Thought Content:  Rumination  Suicidal Thoughts:  Yes.  without intent/plan  Homicidal Thoughts:  No  Memory:  Immediate;   Fair Recent;   Fair  Judgement:  Fair  Insight:  Fair  Psychomotor Activity:  Psychomotor Retardation  Concentration:  Fair  Recall:  Good  Fund of Knowledge:Good  Language: Good  Akathisia:  NA  Handed:  Right  AIMS (if indicated):     Assets:  Communication Skills Desire for Improvement Housing Leisure Time Resilience Social Support Talents/Skills  ADL's:  Impaired  Cognition: WNL  Sleep:      Medical Decision Making: New problem, with additional work up planned, Review of Psycho-Social Stressors (1), Review or order clinical lab tests (1), Discuss test with performing physician (1), Established Problem, Worsening (2), Review or order medicine tests (1), Review of Medication Regimen & Side Effects (2) and Review of New Medication or Change in Dosage (2)  Treatment Plan Summary: Daily contact with patient to assess and evaluate symptoms and progress in treatment and Medication management  Plan:  Increase Lexapro 20 mg PO QD - starting tomorrow for depression Start Remeron 15 mg PO Qhs starting tonight for poor appetite, sleep and mood Discontinue Trazodone - not helpful No evidence of  imminent risk to self or others at present.   Patient does not meet criteria for psychiatric inpatient admission. Supportive therapy provided about ongoing stressors.  Appreciate psychiatric consultation and follow up as clinically required Please contact [phone] or [phone] if needs further assistance   Disposition: Patient benefit from the out-of-home placement with mental health services  [facility]  R. 08/23/2014 9:28 AM

## 2014-08-24 LAB — GLUCOSE, CAPILLARY
GLUCOSE-CAPILLARY: 107 mg/dL — AB (ref 70–99)
Glucose-Capillary: 188 mg/dL — ABNORMAL HIGH (ref 70–99)

## 2014-08-24 MED ORDER — ESCITALOPRAM OXALATE 20 MG PO TABS: 20.0000 mg | ORAL_TABLET | Freq: Every day | ORAL | Status: DC

## 2014-08-24 MED ORDER — HYDROCORTISONE 2.5 % RE CREA: TOPICAL_CREAM | Freq: Two times a day (BID) | RECTAL | Status: DC

## 2014-08-24 MED ORDER — FUROSEMIDE 40 MG PO TABS: 40.0000 mg | ORAL_TABLET | Freq: Every day | ORAL | Status: DC

## 2014-08-24 MED ORDER — MIRTAZAPINE 15 MG PO TBDP: 15.0000 mg | ORAL_TABLET | Freq: Every day | ORAL | Status: DC

## 2014-08-24 MED ORDER — DIPHENHYDRAMINE-ZINC ACETATE 2-0.1 % EX CREA: TOPICAL_CREAM | Freq: Three times a day (TID) | CUTANEOUS | Status: AC | PRN

## 2014-08-24 MED ORDER — SPIRONOLACTONE 100 MG PO TABS: 100.0000 mg | ORAL_TABLET | Freq: Every day | ORAL | Status: DC

## 2014-08-24 MED ORDER — ASPIRIN 81 MG PO TBEC: 81.0000 mg | DELAYED_RELEASE_TABLET | Freq: Every day | ORAL | Status: DC

## 2014-08-24 MED ORDER — TRAMADOL HCL 50 MG PO TABS: 50.0000 mg | ORAL_TABLET | Freq: Four times a day (QID) | ORAL | Status: DC | PRN

## 2014-08-24 NOTE — Progress Notes (Signed)
Report called to karen at the SNF.

## 2014-08-24 NOTE — Progress Notes (Signed)
Patient left for SNF.

## 2014-08-24 NOTE — Progress Notes (Signed)
Tried to call report to SNF. Nurse not available to take report. Will try again.

## 2014-08-24 NOTE — Consult Note (Signed)
Psychiatry Consult follow-up note  Reason for Consult:  Depression and anxiety Referring Physician:  Dr. Daleen Bo  Patient Identification: Garrett Turner MRN:  [id number] Principal Diagnosis: Adjustment disorder with mixed anxiety and depressed mood Diagnosis:   Patient Active Problem List   Diagnosis Date Noted  . Adjustment disorder with mixed anxiety and depressed mood [F43.23] 08/21/2014  . Insomnia [G47.00] 08/19/2014  . CAP (community acquired pneumonia) [J18.9] 08/18/2014  . Sinusitis, chronic [J32.9] 08/16/2014  . Acute CVA (cerebrovascular accident) [I63.9] 08/15/2014  . Edema [R60.9]   . Acute pulmonary edema [J81.0]   . Right sided weakness [M62.89]   . Anemia, unspecified [D64.9] 02/14/2014  . Type II or unspecified type diabetes mellitus with neurological manifestations, not stated as uncontrolled(250.60) [E11.49] 04/24/2013  . Left leg cellulitis [Q73.419] 04/13/2012  . Lactic acid acidosis [E87.2] 04/13/2012  . Sepsis(995.91) [A41.9] 04/13/2012  . GERD (gastroesophageal reflux disease) [K21.9] 04/13/2012  . Cirrhosis of liver [K74.60] 04/13/2012  . Thrombocytopenia [D69.6] 04/13/2012  . Depression with anxiety [F41.8] 04/13/2012  . Bilateral inguinal hernia-containing fat and ascites [K40.20] 09/11/2011  . UTI (lower urinary tract infection) [N39.0] 06/16/2011  . Intractable pain [R52] 06/14/2011  . Fall [W19.XXXA] 06/14/2011  . DM (diabetes mellitus) [E11.9] 06/14/2011  . HTN (hypertension) [I10] 06/14/2011  . Hyperlipidemia [E78.5] 06/14/2011  . Cirrhosis [K74.60] 06/14/2011    Total Time spent with patient: 20 minutes  Subjective:   Garrett Turner is a 64 y.o. male patient admitted with DKA and depression.  HPI: Garrett Turner is a 64 years old male seen, reviewed available medical records and case discussed with hospitalist for psychiatric consultation and evaluation of increased symptoms of depression and anxiety. Patient reported he has been suffering  with a significant symptoms of depression, sadness, disturbance of sleep and appetite, isolation, poor socialization, feeling helpless and worthless. Patient also has passive suicidal ideation. 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 Patient also had multiple part-time works here and there before become unemployed. Patient has a history of depression/nervous breakdown about 10 years ago and seen a psychiatrist in the local community. Patient endorses passive suicidal ideation but denies active intent or plan. Patient has the daughter with 3 children and a couple of friends who is a support system. Patient reportedly suffering with the depression and spiraling down the hill without real social connections over 2 years. Patient believes in the lesion consider himself as a [employer] but does not actively participate in church activities. Patient is willing to participate in medication management. Patient stated that he was not ready to move out of the hospital both physically and mentally today and requested additional time to stay in hospital.   Interval history: Patient seen for psychiatric consultation follow-up today. Patient has been compliant with his medication management for depression and insomnia. Patient reportedly started feeling better and also slept really good last night. Patient was able to eat his breakfast without any difficulties. Patient felt he is ready to go to rehabilitation when needed today. Patient denies current suicidal, homicidal, ideation, intention or plans. Patient has no evidence of psychosis. Recommended no other medication changes during this visit.  Past Medical History:  Past Medical History  Diagnosis Date  . Diabetes mellitus   . Hypertension   . High cholesterol   . Anxiety disorder   . Pancytopenia   . Inguinal hernia     Left  . Cirrhosis   . UTI (lower urinary tract infection)   .  Depression   .  Anxiety   . Leg cramps     Past Surgical History  Procedure Laterality Date  . Tonsillectomy    . Esophagogastroduodenoscopy  11/04/2011    Procedure: ESOPHAGOGASTRODUODENOSCOPY (EGD);  Surgeon: Arta Silence, MD;  Location: [facility];  Service: Endoscopy;  Laterality: N/A;  . Colonoscopy  11/04/2011    Procedure: COLONOSCOPY;  Surgeon: Arta Silence, MD;  Location: [facility];  Service: Endoscopy;  Laterality: N/A;  . Vasectomy  1985   Family History:  Family History  Problem Relation Age of Onset  . Coronary artery disease    . Hypertension    . Heart failure Father   . Hypertension Father   . Heart disease Father   . Heart failure Mother   . Hypertension Mother   . Heart disease Mother   . Stroke Paternal Grandmother   . Obesity Daughter    Social History:  History  Alcohol Use  . 1.1 oz/week  . 1 Glasses of wine, 1 Not specified per week     History  Drug Use No    History   Social History  . Marital Status: Married    Spouse Name: N/A    Number of Children: N/A  . Years of Education: N/A   Social History Main Topics  . Smoking status: Never Smoker   . Smokeless tobacco: Former Systems developer  . Alcohol Use: 1.1 oz/week    1 Glasses of wine, 1 Not specified per week  . Drug Use: No  . Sexual Activity: Not Currently   Other Topics Concern  . None   Social History Narrative   Additional Social History:       Allergies:  No Known Allergies  Vitals: Blood pressure 113/62, pulse 65, temperature 97.9 F (36.6 C), temperature source Oral, resp. rate 16, height 6\' 1"  (1.854 m), weight 84.687 kg (186 lb 11.2 oz), SpO2 95 %.  Risk to Self: Is patient at risk for suicide?: No Risk to Others:   Prior Inpatient Therapy:   Prior Outpatient Therapy:    Current Facility-Administered Medications  Medication Dose Route Frequency Provider Last Rate Last Dose  . aspirin EC tablet 81 mg  81 mg Oral Daily Donzetta Starch, NP   81 mg at 08/23/14 0944  . atorvastatin  (LIPITOR) tablet 20 mg  20 mg Oral Daily Eber Jones, MD   20 mg at 08/23/14 0944  . diphenhydrAMINE-zinc acetate (BENADRYL) 2-0.1 % cream   Topical TID PRN Kinnie Feil, MD      . escitalopram (LEXAPRO) tablet 20 mg  20 mg Oral Daily Durward Parcel, MD      . furosemide (LASIX) tablet 40 mg  40 mg Oral Daily Lear Ng, MD   40 mg at 08/23/14 0944  . hydrocortisone (ANUSOL-HC) 2.5 % rectal cream   Topical BID [name], RPH      . insulin aspart (novoLOG) injection 0-9 Units  0-9 Units Subcutaneous TID WC Eber Jones, MD   2 Units at 08/23/14 1207  . mirtazapine (REMERON SOL-TAB) disintegrating tablet 15 mg  15 mg Oral QHS Durward Parcel, MD   15 mg at 08/23/14 2300  . morphine 2 MG/ML injection 1 mg  1 mg Intravenous Q3H PRN Simbiso Ranga, MD   1 mg at 08/16/14 1244  . propranolol (INDERAL) tablet 10 mg  10 mg Oral BID Eber Jones, MD   10 mg at 08/23/14 0944  . spironolactone (ALDACTONE)  tablet 100 mg  100 mg Oral Daily Lear Ng, MD   100 mg at 08/23/14 0944  . traMADol (ULTRAM) tablet 50 mg  50 mg Oral Q6H PRN Simbiso Ranga, MD   50 mg at 08/16/14 1048  . zolpidem (AMBIEN) tablet 5 mg  5 mg Oral QHS PRN Jeryl Columbia, NP   5 mg at 08/23/14 2300    Musculoskeletal: Strength & Muscle Tone: decreased Gait & Station: unable to stand Patient leans: N/A  Psychiatric Specialty Exam: Physical Exam as per history and physical   ROS depression, anxiety, decreased psychomotor activity and concentration   Blood pressure 113/62, pulse 65, temperature 97.9 F (36.6 C), temperature source Oral, resp. rate 16, height 6\' 1"  (1.854 m), weight 84.687 kg (186 lb 11.2 oz), SpO2 95 %.Body mass index is 24.64 kg/(m^2).  General Appearance: Guarded  Eye Contact::  Good  Speech:  Clear and Coherent and Slow  Volume:  Decreased  Mood:  Depressed  Affect:  Constricted and Depressed  Thought Process:  Coherent and Goal  Directed  Orientation:  Full (Time, Place, and Person)  Thought Content:  WDL  Suicidal Thoughts:  No  Homicidal Thoughts:  No  Memory:  Immediate;   Fair Recent;   Fair  Judgement:  Fair  Insight:  Fair  Psychomotor Activity:  Psychomotor Retardation  Concentration:  Fair  Recall:  Good  Fund of Knowledge:Good  Language: Good  Akathisia:  NA  Handed:  Right  AIMS (if indicated):     Assets:  Communication Skills Desire for Improvement Housing Leisure Time Resilience Social Support Talents/Skills  ADL's:  Impaired  Cognition: WNL  Sleep:      Medical Decision Making: New problem, with additional work up planned, Review of Psycho-Social Stressors (1), Review or order clinical lab tests (1), Discuss test with performing physician (1), Established Problem, Worsening (2), Review or order medicine tests (1), Review of Medication Regimen & Side Effects (2) and Review of New Medication or Change in Dosage (2)  Treatment Plan Summary: Daily contact with patient to assess and evaluate symptoms and progress in treatment and Medication management  Plan:  Continue Lexapro 20 mg PO QD for depression Continue Remeron 15 mg PO Qhs for poor appetite, sleep and mood No evidence of imminent risk to self or others at present.   Patient does not meet criteria for psychiatric inpatient admission. Supportive therapy provided about ongoing stressors.  Psychiatric consultation will sign off at this time and will be referred to the outpatient psychiatric services  Please contact [phone] or [phone] if needs further assistance   Disposition: Patient benefit from the out-of-home placement with mental health services  Garrett Turner,JANARDHAHA R. 08/24/2014 8:10 AM

## 2014-08-24 NOTE — Discharge Summary (Signed)
Physician Discharge Summary  Garrett Turner CHE:[id number] DOB: 1951/03/29 DOA: 08/14/2014  PCP: Lorayne Marek, MD  Admit date: 08/14/2014 Discharge date: 08/24/2014  Time spent: >35 minutes  Recommendations for Outpatient Follow-up:  SNF F/u with neurology in 3-4 weeks F/u with gastroenterology in 2-3 weeks BMP, CBC in 1 week  Discharge Diagnoses:  Principal Problem:   Adjustment disorder with mixed anxiety and depressed mood Active Problems:   Acute pulmonary edema   Right sided weakness   Sinusitis, chronic   CAP (community acquired pneumonia)   Insomnia   Discharge Condition: stable   Diet recommendation: low sodium, DM  Filed Weights   08/15/14 0316  Weight: 84.687 kg (186 lb 11.2 oz)    History of present illness:  64 y/o male with PMH of DM2, HTN, with NASH cirrhosis ( sees Dr Ethlyn Daniels) who presented to [facility] for dyspnea and leg swelling and developed rt sided weakness and speech impairment while in the ED therefore transferred from [facility] to [facility] for stroke w/up.  -Head CT negative. No TPA given due to low platelets. Stroke workup including brain MRI/carotid Doppler/2-D echocardiogram unrevealing. However, his blood pressure has been on the low side and this may have been the primary cause of the transient symptoms in the ED. Question is underlying cause which could include medications/infection.  -He had paracentesis done by Dr. Michail Sermon and this did not suggest SBP. UA unremarkable . Dr Sanjuana Letters discontinued Losartan in view of low blood pressure but continued propranolol and diuretics per GI. Ammonia level is normal. Patient was started on low-dose aspirin per neurology recommendations mindful of thrombocytopenia.  He had CT chest which showed "Moderate left pleural effusion is noted which is slightly improved compared to prior exam. Stable multifocal airspace opacities are noted in both lungs which may represent pneumonia, subsegmental atelectasis or possibly scarring. Followup  CT scan in 7 in 10 days is recommended to ensure resolution or stability these abnormalities. Stable paraseptal thickening is noted peripherally in both lungs which is most consistent with scarring, but edema cannot be excluded. Continued presence of hepatic cirrhosis with splenomegaly and moderate ascites".  -Patient expressed severe depression on Lexapro. Remeron per psychiatry evalution;   Hospital Course:  1. Possible TIA vs neurological symptoms due to hypotension; MRI head: no acute infarcts;  - On ASA/ Lipitor per Neuro-Neuro is aware that platelets are low; d/w patient, needs close monitoring for s/s of bleeding; repeat CBC 5-7 days at SNF 2. Cirrhosis of liver (NASH) with ascites and elevated LFTs and INR  - s/p paracentesis- 2 L removed on 1/28  - note that he is on Propranolol and diuretics now. - no encephalopathy noted  - recommend Daily weights at SNF; F/u with Dr Paulita Fujita in 2 wks; Bmet in 1 wk to ensure he is not being over diuresed; repeat BMP in 5-7 days   -avoid benzo  3. Pneumonia- CAP Levaquin completed  4. Depression, h/o SI; currently denies suicidal ideations or homicidal ideations  -patient was evaluated by psychiatry, started on lexapro, Remeron; currently denies any suicidal or homicidal ideations or plans; cont outpatient follow up 5. Rash on face  - states his dermatologist prescribed hydrocortisone 2.5 % 6. Hx of gallstones  -Patient seen by surgery and given underlying cirrhosis have recommended to hold off on sx at this time.  7. DM type 2 last A1C of 6  - on metformin as outpt which can be continued as long as Cr < 1.5  -currently stable on diet control;  8. Rt upper lobe nodule/Left Pleural effusion  -13 mm RUL nodule seen on CTA. - repeat CT in 7-10 days from 1/29 per radiology recommendations    Recently updated his daughter  Consultants:  Neuro  GI  Procedures:  MRI brain  2 d echo  Carotid Doppler  CT chest  Antibiotics: Levaquin  08/18/2014>08/23/14 Discharge Exam: Filed Vitals:   08/24/14 0546  BP: 113/62  Pulse: 65  Temp: 97.9 F (36.6 C)  Resp: 16    General: alert, oriented  Cardiovascular: s1,s2 rrr Respiratory: CTA BL  Discharge Instructions  Discharge Instructions    Ambulatory referral to Neurology    Complete by:  As directed   Stroke patient. Dr. Leonie Man prefers follow up in 1 month     Diet - low sodium heart healthy    Complete by:  As directed      Discharge instructions    Complete by:  As directed   Please follow up with neurology in 1 month Please follow up with gastroenterology in 2-3 weeks     Increase activity slowly    Complete by:  As directed             Medication List    STOP taking these medications        amoxicillin-clavulanate 875-125 MG per tablet  Commonly known as:  AUGMENTIN     losartan-hydrochlorothiazide 50-12.5 MG per tablet  Commonly known as:  HYZAAR     metFORMIN 1000 MG tablet  Commonly known as:  GLUCOPHAGE     traZODone 50 MG tablet  Commonly known as:  DESYREL      TAKE these medications        aspirin 81 MG EC tablet  Take 1 tablet (81 mg total) by mouth daily.     atorvastatin 20 MG tablet  Commonly known as:  LIPITOR  Take 1 tablet (20 mg total) by mouth daily.     diphenhydrAMINE-zinc acetate cream  Commonly known as:  BENADRYL  Apply topically 3 (three) times daily as needed for itching.     escitalopram 20 MG tablet  Commonly known as:  LEXAPRO  Take 1 tablet (20 mg total) by mouth daily.     furosemide 40 MG tablet  Commonly known as:  LASIX  Take 1 tablet (40 mg total) by mouth daily.     hydrocortisone 2.5 % rectal cream  Commonly known as:  ANUSOL-HC  Apply topically 2 (two) times daily.     mirtazapine 15 MG disintegrating tablet  Commonly known as:  REMERON SOL-TAB  Take 1 tablet (15 mg total) by mouth at bedtime.     propranolol 10 MG tablet  Commonly known as:  INDERAL  Take 1 tablet (10 mg total) by mouth 2  (two) times daily.     spironolactone 100 MG tablet  Commonly known as:  ALDACTONE  Take 1 tablet (100 mg total) by mouth daily.     traMADol 50 MG tablet  Commonly known as:  ULTRAM  Take 1 tablet (50 mg total) by mouth every 6 (six) hours as needed for moderate pain.       No Known Allergies     Follow-up Information    Follow up with SETHI,PRAMOD, MD In 1 month.   Specialties:  Neurology, Radiology   Why:  Stroke Clinic, Office will call you with appointment date & time   Contact information:   [address] [place]  [address] [phone]  Follow up with Lorayne Marek, MD In 1 week.   Specialty:  Internal Medicine   Contact information:   [place] [address] [phone]        The results of significant diagnostics from this hospitalization (including imaging, microbiology, ancillary and laboratory) are listed below for reference.    Significant Diagnostic Studies: Ct Head Wo Contrast  08/14/2014   CLINICAL DATA:  Cold stroke, syncope, new onset right-sided weakness  EXAM: CT HEAD WITHOUT CONTRAST  TECHNIQUE: Contiguous axial images were obtained from the base of the skull through the vertex without intravenous contrast.  COMPARISON:  06/18/2011  FINDINGS: No skull fracture is noted. No intracranial hemorrhage, mass effect or midline shift. No definite acute cortical infarction. No mass lesion is noted on this unenhanced scan. There is residual vascular enhancement from CT scan of the chest same day.  IMPRESSION: No intracranial hemorrhage, mass effect or midline shift. No definite acute cortical infarct. These results were called by telephone at the time of interpretation on 08/14/2014 at 10:09 pm to Dr. Dorie Rank , who verbally acknowledged these results.   Electronically Signed   By: Lahoma Crocker M.D.   On: 08/14/2014 22:09   Ct Chest Wo Contrast  08/17/2014   CLINICAL DATA:  Shortness of breath, pulmonary edema.  EXAM: CT  CHEST WITHOUT CONTRAST  TECHNIQUE: Multidetector CT imaging of the chest was performed following the standard protocol without IV contrast.  COMPARISON:  CT scan of August 14, 2014.  FINDINGS: No pneumothorax is noted. Moderate left pleural effusion is noted which may be slightly improved compared to prior exam. Stable small multifocal opacities are noted in the right upper lobe which may simply represent pneumonia. Opacity is noted peripherally in the left upper lobe laterally which is unchanged and may represent scarring, pneumonia or subsegmental atelectasis. Similar density is seen along the right major fissure laterally in the right lung base. Probable minimal subsegmental atelectasis is noted in the right posterior costophrenic sulcus. Peripheral septal thickening is noted bilaterally which is unchanged compared to prior exam and is most consistent with scarring, or possible edema. No significant osseous abnormality is noted.  Within the visualized portion the abdomen, there is noted moderate ascites with hepatic cirrhosis. Moderate splenomegaly is noted.  IMPRESSION: Moderate left pleural effusion is noted which is slightly improved compared to prior exam. Stable multifocal airspace opacities are noted in both lungs which may represent pneumonia, subsegmental atelectasis or possibly scarring. Followup CT scan in 7 in 10 days is recommended to ensure resolution or stability these abnormalities.  Stable paraseptal thickening is noted peripherally in both lungs which is most consistent with scarring, but edema cannot be excluded.  Continued presence of hepatic cirrhosis with splenomegaly and moderate ascites.   Electronically Signed   By: Sabino Dick M.D.   On: 08/17/2014 19:57   Ct Angio Chest Pe W/cm &/or Wo Cm  08/14/2014   CLINICAL DATA:  Severe dyspnea with fatigue.  EXAM: CT ANGIOGRAPHY CHEST WITH CONTRAST  TECHNIQUE: Multidetector CT imaging of the chest was performed using the standard protocol  during bolus administration of intravenous contrast. Multiplanar CT image reconstructions and MIPs were obtained to evaluate the vascular anatomy.  CONTRAST:  176mL OMNIPAQUE IOHEXOL 350 MG/ML SOLN  COMPARISON:  None.  FINDINGS: Cardiovascular: There is good opacification of the pulmonary arteries with no evidence of pulmonary embolism. The thoracic aorta is normal in caliber and intact.  Lungs: There is extensive interstitial thickening. This probably represents  interstitial edema but a component of interstitial fibrosis cannot be excluded. There is a 13 mm spiculated opacity in the right upper lobe laterally and there are a few additional scattered areas of ground-glass opacity in the periphery of both lungs. These could represent more confluent areas of interstitial abnormality but follow-up is necessary to exclude persistence of nodules after resolution of the interstitial fluid.  Central airways: Patent  Effusions: Moderate left pleural effusion. No right pleural effusion.  Lymphadenopathy: There are a few scattered benign appearing axillary nodes. No hilar or mediastinal adenopathy is evident.  Esophagus: Unremarkable  Upper abdomen: Marked cirrhotic appearing irregularities of the liver. Splenic enlargement. Probable large varices around the distal esophagus and EG junction. Large volume ascites.  Musculoskeletal: No acute findings  Review of the MIP images confirms the above findings.  IMPRESSION: *Negative for pulmonary embolism. *Extensive interstitial thickening, likely pulmonary edema *Can not exclude a 13 mm right upper lobe nodule although it may merely represent more confluent interstitial abnormality. Follow-up CT recommended after resolution of the interstitial infiltrates/edema. *Moderately large left pleural effusion *Cirrhosis with portal hypertension and large volume peritoneal ascites   Electronically Signed   By: Andreas Newport M.D.   On: 08/14/2014 22:00   Mr Jodene Nam Head Wo  Contrast  08/15/2014   CLINICAL DATA:  Right foot weakness.  Confusion and slurred speech  EXAM: MRI HEAD WITHOUT CONTRAST  MRA HEAD WITHOUT CONTRAST  TECHNIQUE: Multiplanar, multiecho pulse sequences of the brain and surrounding structures were obtained without intravenous contrast. Angiographic images of the head were obtained using MRA technique without contrast.  COMPARISON:  CT head 08/14/2014  FINDINGS: MRI HEAD FINDINGS  Negative for acute infarct.  Mild chronic microvascular ischemic changes in the white matter. Mild chronic ischemia in the pons.  Ventricle size is normal. Cerebral volume is normal for age. Pituitary is normal in size.  Negative for intracranial hemorrhage or fluid collection.  Negative for mass or edema.  Mucosal edema in the sphenoid sinus with retained secretions.  MRA HEAD FINDINGS  Both vertebral arteries are patent to the basilar without stenosis. Left PICA patent. Right PICA not visualized. AICA, superior cerebellar, and posterior cerebral arteries are patent without significant stenosis  Internal carotid artery is widely patent bilaterally. Hypoplastic right A1 segment, a normal variant. Both A2 segments are supplied from the left without stenosis. Middle cerebral arteries are patent bilaterally.  Negative for cerebral aneurysm.  IMPRESSION: Negative for acute infarct. Mild chronic microvascular ischemic change in the white matter and pons  Sphenoid sinusitis  Negative MRA head   Electronically Signed   By: Franchot Gallo M.D.   On: 08/15/2014 09:06   Mri Brain Without Contrast  08/15/2014   CLINICAL DATA:  Right foot weakness.  Confusion and slurred speech  EXAM: MRI HEAD WITHOUT CONTRAST  MRA HEAD WITHOUT CONTRAST  TECHNIQUE: Multiplanar, multiecho pulse sequences of the brain and surrounding structures were obtained without intravenous contrast. Angiographic images of the head were obtained using MRA technique without contrast.  COMPARISON:  CT head 08/14/2014  FINDINGS: MRI  HEAD FINDINGS  Negative for acute infarct.  Mild chronic microvascular ischemic changes in the white matter. Mild chronic ischemia in the pons.  Ventricle size is normal. Cerebral volume is normal for age. Pituitary is normal in size.  Negative for intracranial hemorrhage or fluid collection.  Negative for mass or edema.  Mucosal edema in the sphenoid sinus with retained secretions.  MRA HEAD FINDINGS  Both vertebral arteries are patent to  the basilar without stenosis. Left PICA patent. Right PICA not visualized. AICA, superior cerebellar, and posterior cerebral arteries are patent without significant stenosis  Internal carotid artery is widely patent bilaterally. Hypoplastic right A1 segment, a normal variant. Both A2 segments are supplied from the left without stenosis. Middle cerebral arteries are patent bilaterally.  Negative for cerebral aneurysm.  IMPRESSION: Negative for acute infarct. Mild chronic microvascular ischemic change in the white matter and pons  Sphenoid sinusitis  Negative MRA head   Electronically Signed   By: Franchot Gallo M.D.   On: 08/15/2014 09:06   US Paracentesis  08/16/2014   INDICATION: Ascites; cirrhosis  EXAM: ULTRASOUND-GUIDED PARACENTESIS  COMPARISON:  None.  MEDICATIONS: 10 cc 1% lidocaine  COMPLICATIONS: None immediate  TECHNIQUE: Informed written consent was obtained from the patient after a discussion of the risks, benefits and alternatives to treatment. A timeout was performed prior to the initiation of the procedure.  Initial ultrasound scanning demonstrates a moderate amount of ascites within the right lower abdominal quadrant. The right lower abdomen was prepped and draped in the usual sterile fashion. 1% lidocaine with epinephrine was used for local anesthesia. Under direct ultrasound guidance, a 19 gauge, 7-cm, Yueh catheter was introduced. An ultrasound image was saved for documentation purposed. The paracentesis was performed. The catheter was removed and a dressing  was applied. The patient tolerated the procedure well without immediate post procedural complication.  FINDINGS: A total of approximately 2 liters of yellow fluid was removed. Samples were sent to the laboratory as requested by the clinical team.  IMPRESSION: Successful ultrasound-guided paracentesis yielding 2 liters of peritoneal fluid.  2 liters maximum per MD.  Read by:  Lavonia Drafts [facility]   Electronically Signed   By: Daryll Brod M.D.   On: 08/16/2014 14:38   Dg Chest Port 1 View  08/14/2014   CLINICAL DATA:  Shortness of Breath, history of cirrhosis  EXAM: PORTABLE CHEST - 1 VIEW  COMPARISON:  10/17/2013  FINDINGS: Cardiomediastinal silhouette is stable. Mild interstitial prominence bilaterally without pulmonary edema. No segmental infiltrate.  IMPRESSION: Mild interstitial prominence bilaterally without convincing pulmonary edema. No segmental infiltrate.   Electronically Signed   By: Lahoma Crocker M.D.   On: 08/14/2014 17:54    Microbiology: Recent Results (from the past 240 hour(s))  MRSA PCR Screening     Status: None   Collection Time: 08/15/14  8:57 AM  Result Value Ref Range Status   MRSA by PCR NEGATIVE NEGATIVE Final    Comment:        The GeneXpert MRSA Assay (FDA approved for NASAL specimens only), is one component of a comprehensive MRSA colonization surveillance program. It is not intended to diagnose MRSA infection nor to guide or monitor treatment for MRSA infections.   Culture, Urine     Status: None   Collection Time: 08/16/14  5:50 PM  Result Value Ref Range Status   Specimen Description URINE, RANDOM  Final   Special Requests NONE  Final   Colony Count NO GROWTH Performed at [facility]   Final   Culture NO GROWTH Performed at [facility]   Final   Report Status 08/17/2014 FINAL  Final     Labs: Basic Metabolic Panel:  Recent Labs Lab 08/18/14 0627 08/19/14 0355 08/20/14 0730 08/22/14 0614  NA 134* 134* 133* 134*  K 3.8 4.0 4.2  4.3  CL 103 105 106 105  CO2 27 24 23 25   GLUCOSE 113* 139* 115*  112*  BUN 10 9 12 9   CREATININE 0.94 0.86 0.92 0.98  CALCIUM 8.1* 8.0* 7.9* 8.1*   Liver Function Tests:  Recent Labs Lab 08/18/14 0627 08/19/14 0355 08/22/14 0614  AST 55* 63* 62*  ALT 27 27 26   ALKPHOS 103 107 155*  BILITOT 2.5* 2.3* 2.5*  PROT 6.4 5.8* 6.2  ALBUMIN 2.2* 2.2* 2.3*   No results for input(s): LIPASE, AMYLASE in the last 168 hours. No results for input(s): AMMONIA in the last 168 hours. CBC:  Recent Labs Lab 08/18/14 0627 08/19/14 0355 08/20/14 0730 08/22/14 0614  WBC 3.5* 3.7* 4.0 3.5*  HGB 10.0* 9.8* 9.7* 10.2*  HCT 29.0* 28.6* 27.8* 29.1*  MCV 89.8 91.4 89.4 90.1  PLT 50* 54* 55* 49*   Cardiac Enzymes: No results for input(s): CKTOTAL, CKMB, CKMBINDEX, TROPONINI in the last 168 hours. BNP: BNP (last 3 results)  Recent Labs  08/14/14 1725  BNP 121.5*    ProBNP (last 3 results)  Recent Labs  10/17/13 1324  PROBNP 121.1    CBG:  Recent Labs Lab 08/23/14 0700 08/23/14 1135 08/23/14 1625 08/23/14 2236 08/24/14 0631  GLUCAP 121* 178* 99 211* 107*       Signed:  Ahlaya Ende N  [facility] 08/24/2014, 10:11 AM

## 2014-08-24 NOTE — Clinical Social Work Note (Signed)
Clinical Social Worker facilitated patient discharge including contacting patient family and facility to confirm patient discharge plans.  Clinical information faxed to facility and family agreeable with plan.  CSW arranged ambulance transport via [facility] to [facility].  RN to call report prior to discharge.  Clinical Social Worker will sign off for now as social work intervention is no longer needed. Please consult [place] again if new need arises.  Glendon Axe, MSW, LCSWA [phone] 08/24/2014 10:27 AM

## 2014-08-28 ENCOUNTER — Encounter: Payer: Self-pay | Admitting: Adult Health

## 2014-08-28 ENCOUNTER — Non-Acute Institutional Stay (SKILLED_NURSING_FACILITY): Payer: Medicare Other | Admitting: Adult Health

## 2014-08-28 DIAGNOSIS — R911 Solitary pulmonary nodule: Secondary | ICD-10-CM

## 2014-08-28 DIAGNOSIS — I10 Essential (primary) hypertension: Secondary | ICD-10-CM

## 2014-08-28 DIAGNOSIS — J189 Pneumonia, unspecified organism: Secondary | ICD-10-CM

## 2014-08-28 DIAGNOSIS — R188 Other ascites: Secondary | ICD-10-CM

## 2014-08-28 DIAGNOSIS — K7460 Unspecified cirrhosis of liver: Secondary | ICD-10-CM

## 2014-08-28 DIAGNOSIS — D696 Thrombocytopenia, unspecified: Secondary | ICD-10-CM

## 2014-08-28 DIAGNOSIS — E785 Hyperlipidemia, unspecified: Secondary | ICD-10-CM

## 2014-08-28 DIAGNOSIS — G459 Transient cerebral ischemic attack, unspecified: Secondary | ICD-10-CM

## 2014-08-28 DIAGNOSIS — F32A Depression, unspecified: Secondary | ICD-10-CM

## 2014-08-28 DIAGNOSIS — F329 Major depressive disorder, single episode, unspecified: Secondary | ICD-10-CM

## 2014-08-28 DIAGNOSIS — J90 Pleural effusion, not elsewhere classified: Secondary | ICD-10-CM

## 2014-08-28 DIAGNOSIS — R5381 Other malaise: Secondary | ICD-10-CM

## 2014-08-28 DIAGNOSIS — E119 Type 2 diabetes mellitus without complications: Secondary | ICD-10-CM

## 2014-08-28 NOTE — Progress Notes (Signed)
Patient ID: Garrett Turner, male   DOB: 1951-03-19, 64 y.o.   MRN: [id number]   08/28/2014  Facility:  Nursing Home Location:  [place] Room Number: 508-P LEVEL OF CARE:  SNF (31)   Chief Complaint  Patient presents with  . Hospitalization Follow-up    Physical deconditioning, TIA, cirrhosis of liver, depression, diabetes mellitus, right upper lobe nodule, hyperlipidemia, hypertension, pleural effusion and thrombocytopenia    HISTORY OF PRESENT ILLNESS:  This is a 64 year old male who has been admitted to [facility] 08/24/14 from [facility]. He has past medical history of diabetes mellitus,2, hypertension and NASH cirrhosis (sees Dr. Ethlyn Daniels). He presented to the ED with dyspnea, leg swelling, right sided weakness and speech impairment. Stroke work up was negative. CT chest showed moderate left pleural effusion which is slightly improved compared to prior exam.  Patient was seen sitting on the chair in his room. He is alert and oriented. He is concerned about his Metformin which he said is not on his medication list. No complaints of pain. He has been admitted for a short-term rehabilitation.  PAST MEDICAL HISTORY:  Past Medical History  Diagnosis Date  . Diabetes mellitus   . Hypertension   . High cholesterol   . Anxiety disorder   . Pancytopenia   . Inguinal hernia     Left  . Cirrhosis   . UTI (lower urinary tract infection)   . Depression   . Anxiety   . Leg cramps     CURRENT MEDICATIONS: Reviewed per MAR/see medication list  No Known Allergies   REVIEW OF SYSTEMS:  GENERAL: no change in appetite, no fatigue, no weight changes, no fever, chills RESPIRATORY: no cough,  DOE, wheezing, hemoptysis CARDIAC: no chest pain, or palpitations GI: no abdominal pain, diarrhea, heart burn, nausea or vomiting  PHYSICAL EXAMINATION  GENERAL: no acute distress, normal body habitus EYES: conjunctivae normal, sclerae normal, normal eye  lids NECK: supple, trachea midline, no neck masses, no thyroid tenderness, no thyromegaly LYMPHATICS: no LAN in the neck, no supraclavicular LAN RESPIRATORY: breathing is even & unlabored, BS CTAB CARDIAC: RRR, no murmur,no extra heart sounds, BLE edema 1+ GI: abdomen soft, normal BS, no masses, no tenderness, no hepatomegaly, no splenomegaly EXTREMITIES: Able to move 4 extremities PSYCHIATRIC: the patient is alert & oriented to person, affect & behavior appropriate  LABS/RADIOLOGY: Labs reviewed: Basic Metabolic Panel:  Recent Labs  08/17/14 0500  08/19/14 0355 08/20/14 0730 08/22/14 0614  NA 136  < > 134* 133* 134*  K 3.8  < > 4.0 4.2 4.3  CL 105  < > 105 106 105  CO2 27  < > 24 23 25   GLUCOSE 115*  < > 139* 115* 112*  BUN 9  < > 9 12 9   CREATININE 0.94  < > 0.86 0.92 0.98  CALCIUM 8.1*  < > 8.0* 7.9* 8.1*  MG 1.6  --   --   --   --   < > = values in this interval not displayed. Liver Function Tests:  Recent Labs  08/18/14 0627 08/19/14 0355 08/22/14 0614  AST 55* 63* 62*  ALT 27 27 26   ALKPHOS 103 107 155*  BILITOT 2.5* 2.3* 2.5*  PROT 6.4 5.8* 6.2  ALBUMIN 2.2* 2.2* 2.3*    Recent Labs  05/02/14 1842  LIPASE 46    Recent Labs  08/17/14 0500  AMMONIA 27   CBC:  Recent Labs  10/17/13 1929 05/02/14  1842  08/14/14 2209  08/19/14 0355 08/20/14 0730 08/22/14 0614  WBC 3.1* 6.5  < > 3.7*  < > 3.7* 4.0 3.5*  NEUTROABS 1.6* 5.5  --  2.2  --   --   --   --   HGB 9.7* 10.2*  < > 10.2*  < > 9.8* 9.7* 10.2*  HCT 27.9* 29.4*  < > 29.8*  < > 28.6* 27.8* 29.1*  MCV 94.6 92.7  < > 92.0  < > 91.4 89.4 90.1  PLT 44* 102*  < > 48*  < > 54* 55* 49*  < > = values in this interval not displayed.  Lipid Panel:  Recent Labs  02/14/14 1003 06/19/14 1210 08/15/14 0633  HDL 41 29* 25*   CBG:  Recent Labs  08/23/14 2236 08/24/14 0631 08/24/14 1156  GLUCAP 211* 107* 188*    Ct Head Wo Contrast  08/14/2014   CLINICAL DATA:  Cold stroke, syncope, new  onset right-sided weakness  EXAM: CT HEAD WITHOUT CONTRAST  TECHNIQUE: Contiguous axial images were obtained from the base of the skull through the vertex without intravenous contrast.  COMPARISON:  06/18/2011  FINDINGS: No skull fracture is noted. No intracranial hemorrhage, mass effect or midline shift. No definite acute cortical infarction. No mass lesion is noted on this unenhanced scan. There is residual vascular enhancement from CT scan of the chest same day.  IMPRESSION: No intracranial hemorrhage, mass effect or midline shift. No definite acute cortical infarct. These results were called by telephone at the time of interpretation on 08/14/2014 at 10:09 pm to Dr. Dorie Rank , who verbally acknowledged these results.   Electronically Signed   By: Lahoma Crocker M.D.   On: 08/14/2014 22:09   Ct Chest Wo Contrast  08/17/2014   CLINICAL DATA:  Shortness of breath, pulmonary edema.  EXAM: CT CHEST WITHOUT CONTRAST  TECHNIQUE: Multidetector CT imaging of the chest was performed following the standard protocol without IV contrast.  COMPARISON:  CT scan of August 14, 2014.  FINDINGS: No pneumothorax is noted. Moderate left pleural effusion is noted which may be slightly improved compared to prior exam. Stable small multifocal opacities are noted in the right upper lobe which may simply represent pneumonia. Opacity is noted peripherally in the left upper lobe laterally which is unchanged and may represent scarring, pneumonia or subsegmental atelectasis. Similar density is seen along the right major fissure laterally in the right lung base. Probable minimal subsegmental atelectasis is noted in the right posterior costophrenic sulcus. Peripheral septal thickening is noted bilaterally which is unchanged compared to prior exam and is most consistent with scarring, or possible edema. No significant osseous abnormality is noted.  Within the visualized portion the abdomen, there is noted moderate ascites with hepatic  cirrhosis. Moderate splenomegaly is noted.  IMPRESSION: Moderate left pleural effusion is noted which is slightly improved compared to prior exam. Stable multifocal airspace opacities are noted in both lungs which may represent pneumonia, subsegmental atelectasis or possibly scarring. Followup CT scan in 7 in 10 days is recommended to ensure resolution or stability these abnormalities.  Stable paraseptal thickening is noted peripherally in both lungs which is most consistent with scarring, but edema cannot be excluded.  Continued presence of hepatic cirrhosis with splenomegaly and moderate ascites.   Electronically Signed   By: Sabino Dick M.D.   On: 08/17/2014 19:57   Ct Angio Chest Pe W/cm &/or Wo Cm  08/14/2014   CLINICAL DATA:  Severe dyspnea with fatigue.  EXAM: CT ANGIOGRAPHY CHEST WITH CONTRAST  TECHNIQUE: Multidetector CT imaging of the chest was performed using the standard protocol during bolus administration of intravenous contrast. Multiplanar CT image reconstructions and MIPs were obtained to evaluate the vascular anatomy.  CONTRAST:  135mL OMNIPAQUE IOHEXOL 350 MG/ML SOLN  COMPARISON:  None.  FINDINGS: Cardiovascular: There is good opacification of the pulmonary arteries with no evidence of pulmonary embolism. The thoracic aorta is normal in caliber and intact.  Lungs: There is extensive interstitial thickening. This probably represents interstitial edema but a component of interstitial fibrosis cannot be excluded. There is a 13 mm spiculated opacity in the right upper lobe laterally and there are a few additional scattered areas of ground-glass opacity in the periphery of both lungs. These could represent more confluent areas of interstitial abnormality but follow-up is necessary to exclude persistence of nodules after resolution of the interstitial fluid.  Central airways: Patent  Effusions: Moderate left pleural effusion. No right pleural effusion.  Lymphadenopathy: There are a few scattered  benign appearing axillary nodes. No hilar or mediastinal adenopathy is evident.  Esophagus: Unremarkable  Upper abdomen: Marked cirrhotic appearing irregularities of the liver. Splenic enlargement. Probable large varices around the distal esophagus and EG junction. Large volume ascites.  Musculoskeletal: No acute findings  Review of the MIP images confirms the above findings.  IMPRESSION: *Negative for pulmonary embolism. *Extensive interstitial thickening, likely pulmonary edema *Can not exclude a 13 mm right upper lobe nodule although it may merely represent more confluent interstitial abnormality. Follow-up CT recommended after resolution of the interstitial infiltrates/edema. *Moderately large left pleural effusion *Cirrhosis with portal hypertension and large volume peritoneal ascites   Electronically Signed   By: Andreas Newport M.D.   On: 08/14/2014 22:00   Mr Jodene Nam Head Wo Contrast  08/15/2014   CLINICAL DATA:  Right foot weakness.  Confusion and slurred speech  EXAM: MRI HEAD WITHOUT CONTRAST  MRA HEAD WITHOUT CONTRAST  TECHNIQUE: Multiplanar, multiecho pulse sequences of the brain and surrounding structures were obtained without intravenous contrast. Angiographic images of the head were obtained using MRA technique without contrast.  COMPARISON:  CT head 08/14/2014  FINDINGS: MRI HEAD FINDINGS  Negative for acute infarct.  Mild chronic microvascular ischemic changes in the white matter. Mild chronic ischemia in the pons.  Ventricle size is normal. Cerebral volume is normal for age. Pituitary is normal in size.  Negative for intracranial hemorrhage or fluid collection.  Negative for mass or edema.  Mucosal edema in the sphenoid sinus with retained secretions.  MRA HEAD FINDINGS  Both vertebral arteries are patent to the basilar without stenosis. Left PICA patent. Right PICA not visualized. AICA, superior cerebellar, and posterior cerebral arteries are patent without significant stenosis  Internal carotid  artery is widely patent bilaterally. Hypoplastic right A1 segment, a normal variant. Both A2 segments are supplied from the left without stenosis. Middle cerebral arteries are patent bilaterally.  Negative for cerebral aneurysm.  IMPRESSION: Negative for acute infarct. Mild chronic microvascular ischemic change in the white matter and pons  Sphenoid sinusitis  Negative MRA head   Electronically Signed   By: Franchot Gallo M.D.   On: 08/15/2014 09:06   Mri Brain Without Contrast  08/15/2014   CLINICAL DATA:  Right foot weakness.  Confusion and slurred speech  EXAM: MRI HEAD WITHOUT CONTRAST  MRA HEAD WITHOUT CONTRAST  TECHNIQUE: Multiplanar, multiecho pulse sequences of the brain and surrounding structures were obtained without intravenous contrast. Angiographic images of the head were obtained  using MRA technique without contrast.  COMPARISON:  CT head 08/14/2014  FINDINGS: MRI HEAD FINDINGS  Negative for acute infarct.  Mild chronic microvascular ischemic changes in the white matter. Mild chronic ischemia in the pons.  Ventricle size is normal. Cerebral volume is normal for age. Pituitary is normal in size.  Negative for intracranial hemorrhage or fluid collection.  Negative for mass or edema.  Mucosal edema in the sphenoid sinus with retained secretions.  MRA HEAD FINDINGS  Both vertebral arteries are patent to the basilar without stenosis. Left PICA patent. Right PICA not visualized. AICA, superior cerebellar, and posterior cerebral arteries are patent without significant stenosis  Internal carotid artery is widely patent bilaterally. Hypoplastic right A1 segment, a normal variant. Both A2 segments are supplied from the left without stenosis. Middle cerebral arteries are patent bilaterally.  Negative for cerebral aneurysm.  IMPRESSION: Negative for acute infarct. Mild chronic microvascular ischemic change in the white matter and pons  Sphenoid sinusitis  Negative MRA head   Electronically Signed   By: Franchot Gallo M.D.   On: 08/15/2014 09:06   US Paracentesis  08/16/2014   INDICATION: Ascites; cirrhosis  EXAM: ULTRASOUND-GUIDED PARACENTESIS  COMPARISON:  None.  MEDICATIONS: 10 cc 1% lidocaine  COMPLICATIONS: None immediate  TECHNIQUE: Informed written consent was obtained from the patient after a discussion of the risks, benefits and alternatives to treatment. A timeout was performed prior to the initiation of the procedure.  Initial ultrasound scanning demonstrates a moderate amount of ascites within the right lower abdominal quadrant. The right lower abdomen was prepped and draped in the usual sterile fashion. 1% lidocaine with epinephrine was used for local anesthesia. Under direct ultrasound guidance, a 19 gauge, 7-cm, Yueh catheter was introduced. An ultrasound image was saved for documentation purposed. The paracentesis was performed. The catheter was removed and a dressing was applied. The patient tolerated the procedure well without immediate post procedural complication.  FINDINGS: A total of approximately 2 liters of yellow fluid was removed. Samples were sent to the laboratory as requested by the clinical team.  IMPRESSION: Successful ultrasound-guided paracentesis yielding 2 liters of peritoneal fluid.  2 liters maximum per MD.  Read by:  Lavonia Drafts [facility]   Electronically Signed   By: Daryll Brod M.D.   On: 08/16/2014 14:38   Dg Chest Port 1 View  08/14/2014   CLINICAL DATA:  Shortness of Breath, history of cirrhosis  EXAM: PORTABLE CHEST - 1 VIEW  COMPARISON:  10/17/2013  FINDINGS: Cardiomediastinal silhouette is stable. Mild interstitial prominence bilaterally without pulmonary edema. No segmental infiltrate.  IMPRESSION: Mild interstitial prominence bilaterally without convincing pulmonary edema. No segmental infiltrate.   Electronically Signed   By: Lahoma Crocker M.D.   On: 08/14/2014 17:54    ASSESSMENT/PLAN:  Physical deconditioning - for rehabilitation TIA - stable; continue aspirin 81  mg by mouth daily and Lipitor 20 mg by mouth daily; follow-up with Dr. Leonie Man, neurology Cirrhosis of liver ([facility]) with ascites -  S/P paracenteses, 2 L removed on 1/28; follow-up with Dr. Ethlyn Daniels, gastroenterology in 2 weeks; continue Lasix 40 mg by mouth daily and Aldactone 100 mg PO daily; daily weights Pneumonia - Levaquin course was completed in hospital Depression - mood is stable; continue Lexapro 20 mg by mouth daily and Remeron 15 mg by mouth daily at bedtime Diabetes mellitus, type II - hemoglobin A1c 6; restart metformin 1000 mg by mouth twice a day and CBG twice a day Right upper lobe nodule -  for repeat CT of chest/lungs Thrombocytopenia - platelet 49; monitor platelet count Hyperlipidemia - continue Lipitor 20 mg by mouth daily Hypertension - continue Inderal 10 mg by mouth twice a day; BP/heart rate twice a day 1 week    Goals of care:  Short-term rehabilitation   Labs/test ordered:  CBC, CMP and BMP in 1 week; repeat CT of chest/lung   Spent 50 minutes in patient care.   [facility], NP [employer] [phone]

## 2014-08-30 ENCOUNTER — Encounter: Payer: Self-pay | Admitting: Adult Health

## 2014-08-30 ENCOUNTER — Non-Acute Institutional Stay (SKILLED_NURSING_FACILITY): Payer: Medicare Other | Admitting: Adult Health

## 2014-08-30 DIAGNOSIS — E43 Unspecified severe protein-calorie malnutrition: Secondary | ICD-10-CM

## 2014-08-30 DIAGNOSIS — W19XXXA Unspecified fall, initial encounter: Secondary | ICD-10-CM

## 2014-08-30 DIAGNOSIS — R001 Bradycardia, unspecified: Secondary | ICD-10-CM

## 2014-08-30 DIAGNOSIS — R188 Other ascites: Secondary | ICD-10-CM

## 2014-08-30 DIAGNOSIS — K7460 Unspecified cirrhosis of liver: Secondary | ICD-10-CM

## 2014-08-30 NOTE — Progress Notes (Signed)
Patient ID: Garrett Turner, male   DOB: 1950-08-10, 64 y.o.   MRN: [id number]   08/30/2014  Facility:  [facility] Home Location:  [place] Room Number: 508-P LEVEL OF CARE:  SNF (31)   Chief Complaint  Patient presents with  . Acute Visit    S/P Fall, Bradycardia, Liver cirrhosis and Protein-Calorie malnutrition, severe    HISTORY OF PRESENT ILLNESS:  This is a 64 year old male who had a fall in his room sustaining no injury. He is currently taking Inderal, Aldactone and Lasix. BP 122/66 and HR 60. No ascites noted and BLE edema 1+. Complained of being dizzy when he fell down. Noted albumin to be low - 2.5.   PAST MEDICAL HISTORY:  Past Medical History  Diagnosis Date  . Diabetes mellitus   . Hypertension   . High cholesterol   . Anxiety disorder   . Pancytopenia   . Inguinal hernia     Left  . Cirrhosis   . UTI (lower urinary tract infection)   . Depression   . Anxiety   . Leg cramps     CURRENT MEDICATIONS: Reviewed per MAR/see medication list  No Known Allergies   REVIEW OF SYSTEMS:  GENERAL: no change in appetite, no fatigue, no weight changes, no fever, chills RESPIRATORY: no cough,  DOE, wheezing, hemoptysis CARDIAC: no chest pain, or palpitations GI: no abdominal pain, diarrhea, heart burn, nausea or vomiting  PHYSICAL EXAMINATION  GENERAL: no acute distress, normal body habitus NECK: supple, trachea midline, no neck masses, no thyroid tenderness, no thyromegaly LYMPHATICS: no LAN in the neck, no supraclavicular LAN RESPIRATORY: breathing is even & unlabored, BS CTAB CARDIAC: RRR, no murmur,no extra heart sounds, BLE edema 1+ GI: abdomen soft, normal BS, no masses, no tenderness, no hepatomegaly, no splenomegaly EXTREMITIES: Able to move 4 extremities PSYCHIATRIC: the patient is alert & oriented to person, affect & behavior appropriate  LABS/RADIOLOGY: 08/29/14  sodium 137 potassium 4.4 glucose 136 BUN 17 creatinine 0.91  total bilirubin 2.2 alkaline phosphatase 143 SGOT 63 SGPT 33 total protein 6.5 albumin 2.5 calcium 8.4 08/27/14  WBC 3.7 hemoglobin 10.3 hematocrit 29.5 MCV 90.2 Labs reviewed: Basic Metabolic Panel:  Recent Labs  08/17/14 0500  08/19/14 0355 08/20/14 0730 08/22/14 0614  NA 136  < > 134* 133* 134*  K 3.8  < > 4.0 4.2 4.3  CL 105  < > 105 106 105  CO2 27  < > 24 23 25   GLUCOSE 115*  < > 139* 115* 112*  BUN 9  < > 9 12 9   CREATININE 0.94  < > 0.86 0.92 0.98  CALCIUM 8.1*  < > 8.0* 7.9* 8.1*  MG 1.6  --   --   --   --   < > = values in this interval not displayed. Liver Function Tests:  Recent Labs  08/18/14 0627 08/19/14 0355 08/22/14 0614  AST 55* 63* 62*  ALT 27 27 26   ALKPHOS 103 107 155*  BILITOT 2.5* 2.3* 2.5*  PROT 6.4 5.8* 6.2  ALBUMIN 2.2* 2.2* 2.3*    Recent Labs  05/02/14 1842  LIPASE 46    Recent Labs  08/17/14 0500  AMMONIA 27   CBC:  Recent Labs  10/17/13 1929 05/02/14 1842  08/14/14 2209  08/19/14 0355 08/20/14 0730 08/22/14 0614  WBC 3.1* 6.5  < > 3.7*  < > 3.7* 4.0 3.5*  NEUTROABS 1.6* 5.5  --  2.2  --   --   --   --  HGB 9.7* 10.2*  < > 10.2*  < > 9.8* 9.7* 10.2*  HCT 27.9* 29.4*  < > 29.8*  < > 28.6* 27.8* 29.1*  MCV 94.6 92.7  < > 92.0  < > 91.4 89.4 90.1  PLT 44* 102*  < > 48*  < > 54* 55* 49*  < > = values in this interval not displayed.  Lipid Panel:  Recent Labs  02/14/14 1003 06/19/14 1210 08/15/14 0633  HDL 41 29* 25*   CBG:  Recent Labs  08/23/14 2236 08/24/14 0631 08/24/14 1156  GLUCAP 211* 107* 188*    Ct Head Wo Contrast  08/14/2014   CLINICAL DATA:  Cold stroke, syncope, new onset right-sided weakness  EXAM: CT HEAD WITHOUT CONTRAST  TECHNIQUE: Contiguous axial images were obtained from the base of the skull through the vertex without intravenous contrast.  COMPARISON:  06/18/2011  FINDINGS: No skull fracture is noted. No intracranial hemorrhage, mass effect or midline shift. No definite acute cortical  infarction. No mass lesion is noted on this unenhanced scan. There is residual vascular enhancement from CT scan of the chest same day.  IMPRESSION: No intracranial hemorrhage, mass effect or midline shift. No definite acute cortical infarct. These results were called by telephone at the time of interpretation on 08/14/2014 at 10:09 pm to Dr. Dorie Rank , who verbally acknowledged these results.   Electronically Signed   By: Lahoma Crocker M.D.   On: 08/14/2014 22:09   Ct Chest Wo Contrast  08/17/2014   CLINICAL DATA:  Shortness of breath, pulmonary edema.  EXAM: CT CHEST WITHOUT CONTRAST  TECHNIQUE: Multidetector CT imaging of the chest was performed following the standard protocol without IV contrast.  COMPARISON:  CT scan of August 14, 2014.  FINDINGS: No pneumothorax is noted. Moderate left pleural effusion is noted which may be slightly improved compared to prior exam. Stable small multifocal opacities are noted in the right upper lobe which may simply represent pneumonia. Opacity is noted peripherally in the left upper lobe laterally which is unchanged and may represent scarring, pneumonia or subsegmental atelectasis. Similar density is seen along the right major fissure laterally in the right lung base. Probable minimal subsegmental atelectasis is noted in the right posterior costophrenic sulcus. Peripheral septal thickening is noted bilaterally which is unchanged compared to prior exam and is most consistent with scarring, or possible edema. No significant osseous abnormality is noted.  Within the visualized portion the abdomen, there is noted moderate ascites with hepatic cirrhosis. Moderate splenomegaly is noted.  IMPRESSION: Moderate left pleural effusion is noted which is slightly improved compared to prior exam. Stable multifocal airspace opacities are noted in both lungs which may represent pneumonia, subsegmental atelectasis or possibly scarring. Followup CT scan in 7 in 10 days is recommended to ensure  resolution or stability these abnormalities.  Stable paraseptal thickening is noted peripherally in both lungs which is most consistent with scarring, but edema cannot be excluded.  Continued presence of hepatic cirrhosis with splenomegaly and moderate ascites.   Electronically Signed   By: Sabino Dick M.D.   On: 08/17/2014 19:57   Ct Angio Chest Pe W/cm &/or Wo Cm  08/14/2014   CLINICAL DATA:  Severe dyspnea with fatigue.  EXAM: CT ANGIOGRAPHY CHEST WITH CONTRAST  TECHNIQUE: Multidetector CT imaging of the chest was performed using the standard protocol during bolus administration of intravenous contrast. Multiplanar CT image reconstructions and MIPs were obtained to evaluate the vascular anatomy.  CONTRAST:  110mL OMNIPAQUE IOHEXOL  350 MG/ML SOLN  COMPARISON:  None.  FINDINGS: Cardiovascular: There is good opacification of the pulmonary arteries with no evidence of pulmonary embolism. The thoracic aorta is normal in caliber and intact.  Lungs: There is extensive interstitial thickening. This probably represents interstitial edema but a component of interstitial fibrosis cannot be excluded. There is a 13 mm spiculated opacity in the right upper lobe laterally and there are a few additional scattered areas of ground-glass opacity in the periphery of both lungs. These could represent more confluent areas of interstitial abnormality but follow-up is necessary to exclude persistence of nodules after resolution of the interstitial fluid.  Central airways: Patent  Effusions: Moderate left pleural effusion. No right pleural effusion.  Lymphadenopathy: There are a few scattered benign appearing axillary nodes. No hilar or mediastinal adenopathy is evident.  Esophagus: Unremarkable  Upper abdomen: Marked cirrhotic appearing irregularities of the liver. Splenic enlargement. Probable large varices around the distal esophagus and EG junction. Large volume ascites.  Musculoskeletal: No acute findings  Review of the MIP  images confirms the above findings.  IMPRESSION: *Negative for pulmonary embolism. *Extensive interstitial thickening, likely pulmonary edema *Can not exclude a 13 mm right upper lobe nodule although it may merely represent more confluent interstitial abnormality. Follow-up CT recommended after resolution of the interstitial infiltrates/edema. *Moderately large left pleural effusion *Cirrhosis with portal hypertension and large volume peritoneal ascites   Electronically Signed   By: Andreas Newport M.D.   On: 08/14/2014 22:00   Mr Jodene Nam Head Wo Contrast  08/15/2014   CLINICAL DATA:  Right foot weakness.  Confusion and slurred speech  EXAM: MRI HEAD WITHOUT CONTRAST  MRA HEAD WITHOUT CONTRAST  TECHNIQUE: Multiplanar, multiecho pulse sequences of the brain and surrounding structures were obtained without intravenous contrast. Angiographic images of the head were obtained using MRA technique without contrast.  COMPARISON:  CT head 08/14/2014  FINDINGS: MRI HEAD FINDINGS  Negative for acute infarct.  Mild chronic microvascular ischemic changes in the white matter. Mild chronic ischemia in the pons.  Ventricle size is normal. Cerebral volume is normal for age. Pituitary is normal in size.  Negative for intracranial hemorrhage or fluid collection.  Negative for mass or edema.  Mucosal edema in the sphenoid sinus with retained secretions.  MRA HEAD FINDINGS  Both vertebral arteries are patent to the basilar without stenosis. Left PICA patent. Right PICA not visualized. AICA, superior cerebellar, and posterior cerebral arteries are patent without significant stenosis  Internal carotid artery is widely patent bilaterally. Hypoplastic right A1 segment, a normal variant. Both A2 segments are supplied from the left without stenosis. Middle cerebral arteries are patent bilaterally.  Negative for cerebral aneurysm.  IMPRESSION: Negative for acute infarct. Mild chronic microvascular ischemic change in the white matter and pons   Sphenoid sinusitis  Negative MRA head   Electronically Signed   By: Franchot Gallo M.D.   On: 08/15/2014 09:06   Mri Brain Without Contrast  08/15/2014   CLINICAL DATA:  Right foot weakness.  Confusion and slurred speech  EXAM: MRI HEAD WITHOUT CONTRAST  MRA HEAD WITHOUT CONTRAST  TECHNIQUE: Multiplanar, multiecho pulse sequences of the brain and surrounding structures were obtained without intravenous contrast. Angiographic images of the head were obtained using MRA technique without contrast.  COMPARISON:  CT head 08/14/2014  FINDINGS: MRI HEAD FINDINGS  Negative for acute infarct.  Mild chronic microvascular ischemic changes in the white matter. Mild chronic ischemia in the pons.  Ventricle size is normal. Cerebral volume is  normal for age. Pituitary is normal in size.  Negative for intracranial hemorrhage or fluid collection.  Negative for mass or edema.  Mucosal edema in the sphenoid sinus with retained secretions.  MRA HEAD FINDINGS  Both vertebral arteries are patent to the basilar without stenosis. Left PICA patent. Right PICA not visualized. AICA, superior cerebellar, and posterior cerebral arteries are patent without significant stenosis  Internal carotid artery is widely patent bilaterally. Hypoplastic right A1 segment, a normal variant. Both A2 segments are supplied from the left without stenosis. Middle cerebral arteries are patent bilaterally.  Negative for cerebral aneurysm.  IMPRESSION: Negative for acute infarct. Mild chronic microvascular ischemic change in the white matter and pons  Sphenoid sinusitis  Negative MRA head   Electronically Signed   By: Franchot Gallo M.D.   On: 08/15/2014 09:06   US Paracentesis  08/16/2014   INDICATION: Ascites; cirrhosis  EXAM: ULTRASOUND-GUIDED PARACENTESIS  COMPARISON:  None.  MEDICATIONS: 10 cc 1% lidocaine  COMPLICATIONS: None immediate  TECHNIQUE: Informed written consent was obtained from the patient after a discussion of the risks, benefits and  alternatives to treatment. A timeout was performed prior to the initiation of the procedure.  Initial ultrasound scanning demonstrates a moderate amount of ascites within the right lower abdominal quadrant. The right lower abdomen was prepped and draped in the usual sterile fashion. 1% lidocaine with epinephrine was used for local anesthesia. Under direct ultrasound guidance, a 19 gauge, 7-cm, Yueh catheter was introduced. An ultrasound image was saved for documentation purposed. The paracentesis was performed. The catheter was removed and a dressing was applied. The patient tolerated the procedure well without immediate post procedural complication.  FINDINGS: A total of approximately 2 liters of yellow fluid was removed. Samples were sent to the laboratory as requested by the clinical team.  IMPRESSION: Successful ultrasound-guided paracentesis yielding 2 liters of peritoneal fluid.  2 liters maximum per MD.  Read by:  Lavonia Drafts [facility]   Electronically Signed   By: Daryll Brod M.D.   On: 08/16/2014 14:38   Dg Chest Port 1 View  08/14/2014   CLINICAL DATA:  Shortness of Breath, history of cirrhosis  EXAM: PORTABLE CHEST - 1 VIEW  COMPARISON:  10/17/2013  FINDINGS: Cardiomediastinal silhouette is stable. Mild interstitial prominence bilaterally without pulmonary edema. No segmental infiltrate.  IMPRESSION: Mild interstitial prominence bilaterally without convincing pulmonary edema. No segmental infiltrate.   Electronically Signed   By: Lahoma Crocker M.D.   On: 08/14/2014 17:54    ASSESSMENT/PLAN:  S/P Fall - check orthostatic BP/HR BID X 1 week Cirrhosis of liver ([facility])  -  Decrease Lasix to 20 mg PO Q D Bradycardia - decrease Inderal to 10 mg PO daily Protein-Calorie Malnutrition, severe - RD consult    [facility], NP [employer] [phone]

## 2014-09-05 ENCOUNTER — Non-Acute Institutional Stay (SKILLED_NURSING_FACILITY): Payer: Medicare Other | Admitting: Internal Medicine

## 2014-09-05 DIAGNOSIS — D696 Thrombocytopenia, unspecified: Secondary | ICD-10-CM

## 2014-09-05 DIAGNOSIS — J189 Pneumonia, unspecified organism: Secondary | ICD-10-CM

## 2014-09-05 DIAGNOSIS — R5381 Other malaise: Secondary | ICD-10-CM | POA: Diagnosis not present

## 2014-09-05 DIAGNOSIS — I639 Cerebral infarction, unspecified: Secondary | ICD-10-CM | POA: Diagnosis not present

## 2014-09-05 DIAGNOSIS — I10 Essential (primary) hypertension: Secondary | ICD-10-CM | POA: Diagnosis not present

## 2014-09-05 DIAGNOSIS — F418 Other specified anxiety disorders: Secondary | ICD-10-CM

## 2014-09-05 DIAGNOSIS — R188 Other ascites: Secondary | ICD-10-CM

## 2014-09-05 DIAGNOSIS — K7460 Unspecified cirrhosis of liver: Secondary | ICD-10-CM

## 2014-09-05 DIAGNOSIS — R911 Solitary pulmonary nodule: Secondary | ICD-10-CM | POA: Diagnosis not present

## 2014-09-05 DIAGNOSIS — E119 Type 2 diabetes mellitus without complications: Secondary | ICD-10-CM | POA: Diagnosis not present

## 2014-09-12 ENCOUNTER — Non-Acute Institutional Stay (SKILLED_NURSING_FACILITY): Payer: Medicare Other | Admitting: Adult Health

## 2014-09-12 ENCOUNTER — Encounter: Payer: Self-pay | Admitting: Adult Health

## 2014-09-12 DIAGNOSIS — F329 Major depressive disorder, single episode, unspecified: Secondary | ICD-10-CM

## 2014-09-12 DIAGNOSIS — R5381 Other malaise: Secondary | ICD-10-CM

## 2014-09-12 DIAGNOSIS — F32A Depression, unspecified: Secondary | ICD-10-CM

## 2014-09-12 DIAGNOSIS — E119 Type 2 diabetes mellitus without complications: Secondary | ICD-10-CM

## 2014-09-12 DIAGNOSIS — G459 Transient cerebral ischemic attack, unspecified: Secondary | ICD-10-CM

## 2014-09-12 DIAGNOSIS — R911 Solitary pulmonary nodule: Secondary | ICD-10-CM

## 2014-09-12 DIAGNOSIS — D696 Thrombocytopenia, unspecified: Secondary | ICD-10-CM

## 2014-09-12 DIAGNOSIS — E785 Hyperlipidemia, unspecified: Secondary | ICD-10-CM

## 2014-09-12 NOTE — Progress Notes (Signed)
Patient ID: Garrett Turner, male   DOB: 11-19-1950, 64 y.o.   MRN: [id number]   09/12/2014  Facility:  [facility] Home Location:  [place] Room Number: 508-P LEVEL OF CARE:  SNF (31)   Chief Complaint  Patient presents with  . Discharge Note    Physical deconditioning, TIA, cirrhosis of liver, depression, diabetes mellitus, right upper lobe nodule, hyperlipidemia, hypertension and thrombocytopenia    HISTORY OF PRESENT ILLNESS:  This is a 64 year old male who is for discharge home with Home health PT for gait, balance and endurance, OT for ADL independence and safety awareness, Nursing for disease management, Social Worker for resource information and referral and Home health aide for bathing safety. He has been admitted to [facility] 08/24/14 from [facility]. He has past medical history of diabetes mellitus,2, hypertension and NASH cirrhosis (sees Dr. Ethlyn Daniels). He presented to the ED with dyspnea, leg swelling, right sided weakness and speech impairment. Stroke work up was negative. CT chest showed moderate left pleural effusion which is slightly improved compared to prior exam.  Patient's HR noted to be in the 50s and SBPs has been in the 100s. Inderal was decreased to once a day and eventually discontinued. HR today after therapy exercises was 76. Patient ambulates along the hallway with walker. No edema nor ascites noted. He is alert and oriented. Recently added Lactulose to medications due to elevated ammonia level. Patient needs BMP, ammonia level and platelet count done in 1 week @ PCP's office.  Patient was admitted to this facility for short-term rehabilitation after the patient's recent hospitalization.  Patient has completed SNF rehabilitation and therapy has cleared the patient for discharge.   PAST MEDICAL HISTORY:  Past Medical History  Diagnosis Date  . Diabetes mellitus   . Hypertension   . High cholesterol   . Anxiety disorder   .  Pancytopenia   . Inguinal hernia     Left  . Cirrhosis   . UTI (lower urinary tract infection)   . Depression   . Anxiety   . Leg cramps     CURRENT MEDICATIONS: Reviewed per MAR/see medication list  No Known Allergies   REVIEW OF SYSTEMS:  GENERAL: no change in appetite, no fatigue, no weight changes, no fever, chills RESPIRATORY: no cough,  DOE, wheezing, hemoptysis CARDIAC: no chest pain, or palpitations GI: no abdominal pain, diarrhea, heart burn, nausea or vomiting  PHYSICAL EXAMINATION  GENERAL: no acute distress, normal body habitus NECK: supple, trachea midline, no neck masses, no thyroid tenderness, no thyromegaly LYMPHATICS: no LAN in the neck, no supraclavicular LAN RESPIRATORY: breathing is even & unlabored, BS CTAB CARDIAC: RRR, no murmur,no extra heart sounds GI: abdomen soft, normal BS, no masses, no tenderness, no hepatomegaly, no splenomegaly EXTREMITIES: Able to move 4 extremities PSYCHIATRIC: the patient is alert & oriented to person, affect & behavior appropriate  LABS/RADIOLOGY: 09/12/14  sodium 134 potassium 4.2 glucose 116 BUN 18 creatinine 0.94 calcium 8.6 09/10/14  ammonia 119 sodium 133 potassium 4.3 glucose 179 BUN 23 creatinine 1.06 calcium 9.3 09/05/14  sodium 133 potassium 4.4 glucose 152 BUN 19 creatinine 1.07 calcium 8.7 ammonia 97 09/03/14  ammonia 108  08/31/14  ammonia 95 08/29/14  sodium 137 potassium 4.4 glucose 136 BUN 17 creatinine 0.91 SGOT 63 SGPT 33 total protein 6.5 albumin Green 2.5 calcium 8.4 08/27/14  WBC 3.7 hemoglobin 10.3 hematocrit 29.5 MCV 90.2  Labs reviewed: Basic Metabolic Panel:  Recent Labs  08/17/14 0500  08/19/14 0355 08/20/14 0730 08/22/14 0614  NA 136  < > 134* 133* 134*  K 3.8  < > 4.0 4.2 4.3  CL 105  < > 105 106 105  CO2 27  < > 24 23 25   GLUCOSE 115*  < > 139* 115* 112*  BUN 9  < > 9 12 9   CREATININE 0.94  < > 0.86 0.92 0.98  CALCIUM 8.1*  < > 8.0* 7.9* 8.1*  MG 1.6  --   --   --   --   < > = values  in this interval not displayed. Liver Function Tests:  Recent Labs  08/18/14 0627 08/19/14 0355 08/22/14 0614  AST 55* 63* 62*  ALT 27 27 26   ALKPHOS 103 107 155*  BILITOT 2.5* 2.3* 2.5*  PROT 6.4 5.8* 6.2  ALBUMIN 2.2* 2.2* 2.3*    Recent Labs  05/02/14 1842  LIPASE 46    Recent Labs  08/17/14 0500  AMMONIA 27   CBC:  Recent Labs  10/17/13 1929 05/02/14 1842  08/14/14 2209  08/19/14 0355 08/20/14 0730 08/22/14 0614  WBC 3.1* 6.5  < > 3.7*  < > 3.7* 4.0 3.5*  NEUTROABS 1.6* 5.5  --  2.2  --   --   --   --   HGB 9.7* 10.2*  < > 10.2*  < > 9.8* 9.7* 10.2*  HCT 27.9* 29.4*  < > 29.8*  < > 28.6* 27.8* 29.1*  MCV 94.6 92.7  < > 92.0  < > 91.4 89.4 90.1  PLT 44* 102*  < > 48*  < > 54* 55* 49*  < > = values in this interval not displayed.  Lipid Panel:  Recent Labs  02/14/14 1003 06/19/14 1210 08/15/14 0633  HDL 41 29* 25*   CBG:  Recent Labs  08/23/14 2236 08/24/14 0631 08/24/14 1156  GLUCAP 211* 107* 188*    Ct Head Wo Contrast  08/14/2014   CLINICAL DATA:  Cold stroke, syncope, new onset right-sided weakness  EXAM: CT HEAD WITHOUT CONTRAST  TECHNIQUE: Contiguous axial images were obtained from the base of the skull through the vertex without intravenous contrast.  COMPARISON:  06/18/2011  FINDINGS: No skull fracture is noted. No intracranial hemorrhage, mass effect or midline shift. No definite acute cortical infarction. No mass lesion is noted on this unenhanced scan. There is residual vascular enhancement from CT scan of the chest same day.  IMPRESSION: No intracranial hemorrhage, mass effect or midline shift. No definite acute cortical infarct. These results were called by telephone at the time of interpretation on 08/14/2014 at 10:09 pm to Dr. Dorie Rank , who verbally acknowledged these results.   Electronically Signed   By: Lahoma Crocker M.D.   On: 08/14/2014 22:09   Ct Chest Wo Contrast  08/17/2014   CLINICAL DATA:  Shortness of breath, pulmonary  edema.  EXAM: CT CHEST WITHOUT CONTRAST  TECHNIQUE: Multidetector CT imaging of the chest was performed following the standard protocol without IV contrast.  COMPARISON:  CT scan of August 14, 2014.  FINDINGS: No pneumothorax is noted. Moderate left pleural effusion is noted which may be slightly improved compared to prior exam. Stable small multifocal opacities are noted in the right upper lobe which may simply represent pneumonia. Opacity is noted peripherally in the left upper lobe laterally which is unchanged and may represent scarring, pneumonia or subsegmental atelectasis. Similar density is seen along the right major fissure laterally in the right lung base. Probable  minimal subsegmental atelectasis is noted in the right posterior costophrenic sulcus. Peripheral septal thickening is noted bilaterally which is unchanged compared to prior exam and is most consistent with scarring, or possible edema. No significant osseous abnormality is noted.  Within the visualized portion the abdomen, there is noted moderate ascites with hepatic cirrhosis. Moderate splenomegaly is noted.  IMPRESSION: Moderate left pleural effusion is noted which is slightly improved compared to prior exam. Stable multifocal airspace opacities are noted in both lungs which may represent pneumonia, subsegmental atelectasis or possibly scarring. Followup CT scan in 7 in 10 days is recommended to ensure resolution or stability these abnormalities.  Stable paraseptal thickening is noted peripherally in both lungs which is most consistent with scarring, but edema cannot be excluded.  Continued presence of hepatic cirrhosis with splenomegaly and moderate ascites.   Electronically Signed   By: Sabino Dick M.D.   On: 08/17/2014 19:57   Ct Angio Chest Pe W/cm &/or Wo Cm  08/14/2014   CLINICAL DATA:  Severe dyspnea with fatigue.  EXAM: CT ANGIOGRAPHY CHEST WITH CONTRAST  TECHNIQUE: Multidetector CT imaging of the chest was performed using the  standard protocol during bolus administration of intravenous contrast. Multiplanar CT image reconstructions and MIPs were obtained to evaluate the vascular anatomy.  CONTRAST:  171mL OMNIPAQUE IOHEXOL 350 MG/ML SOLN  COMPARISON:  None.  FINDINGS: Cardiovascular: There is good opacification of the pulmonary arteries with no evidence of pulmonary embolism. The thoracic aorta is normal in caliber and intact.  Lungs: There is extensive interstitial thickening. This probably represents interstitial edema but a component of interstitial fibrosis cannot be excluded. There is a 13 mm spiculated opacity in the right upper lobe laterally and there are a few additional scattered areas of ground-glass opacity in the periphery of both lungs. These could represent more confluent areas of interstitial abnormality but follow-up is necessary to exclude persistence of nodules after resolution of the interstitial fluid.  Central airways: Patent  Effusions: Moderate left pleural effusion. No right pleural effusion.  Lymphadenopathy: There are a few scattered benign appearing axillary nodes. No hilar or mediastinal adenopathy is evident.  Esophagus: Unremarkable  Upper abdomen: Marked cirrhotic appearing irregularities of the liver. Splenic enlargement. Probable large varices around the distal esophagus and EG junction. Large volume ascites.  Musculoskeletal: No acute findings  Review of the MIP images confirms the above findings.  IMPRESSION: *Negative for pulmonary embolism. *Extensive interstitial thickening, likely pulmonary edema *Can not exclude a 13 mm right upper lobe nodule although it may merely represent more confluent interstitial abnormality. Follow-up CT recommended after resolution of the interstitial infiltrates/edema. *Moderately large left pleural effusion *Cirrhosis with portal hypertension and large volume peritoneal ascites   Electronically Signed   By: Andreas Newport M.D.   On: 08/14/2014 22:00   Mr Jodene Nam Head  Wo Contrast  08/15/2014   CLINICAL DATA:  Right foot weakness.  Confusion and slurred speech  EXAM: MRI HEAD WITHOUT CONTRAST  MRA HEAD WITHOUT CONTRAST  TECHNIQUE: Multiplanar, multiecho pulse sequences of the brain and surrounding structures were obtained without intravenous contrast. Angiographic images of the head were obtained using MRA technique without contrast.  COMPARISON:  CT head 08/14/2014  FINDINGS: MRI HEAD FINDINGS  Negative for acute infarct.  Mild chronic microvascular ischemic changes in the white matter. Mild chronic ischemia in the pons.  Ventricle size is normal. Cerebral volume is normal for age. Pituitary is normal in size.  Negative for intracranial hemorrhage or fluid collection.  Negative for  mass or edema.  Mucosal edema in the sphenoid sinus with retained secretions.  MRA HEAD FINDINGS  Both vertebral arteries are patent to the basilar without stenosis. Left PICA patent. Right PICA not visualized. AICA, superior cerebellar, and posterior cerebral arteries are patent without significant stenosis  Internal carotid artery is widely patent bilaterally. Hypoplastic right A1 segment, a normal variant. Both A2 segments are supplied from the left without stenosis. Middle cerebral arteries are patent bilaterally.  Negative for cerebral aneurysm.  IMPRESSION: Negative for acute infarct. Mild chronic microvascular ischemic change in the white matter and pons  Sphenoid sinusitis  Negative MRA head   Electronically Signed   By: Franchot Gallo M.D.   On: 08/15/2014 09:06   Mri Brain Without Contrast  08/15/2014   CLINICAL DATA:  Right foot weakness.  Confusion and slurred speech  EXAM: MRI HEAD WITHOUT CONTRAST  MRA HEAD WITHOUT CONTRAST  TECHNIQUE: Multiplanar, multiecho pulse sequences of the brain and surrounding structures were obtained without intravenous contrast. Angiographic images of the head were obtained using MRA technique without contrast.  COMPARISON:  CT head 08/14/2014  FINDINGS:  MRI HEAD FINDINGS  Negative for acute infarct.  Mild chronic microvascular ischemic changes in the white matter. Mild chronic ischemia in the pons.  Ventricle size is normal. Cerebral volume is normal for age. Pituitary is normal in size.  Negative for intracranial hemorrhage or fluid collection.  Negative for mass or edema.  Mucosal edema in the sphenoid sinus with retained secretions.  MRA HEAD FINDINGS  Both vertebral arteries are patent to the basilar without stenosis. Left PICA patent. Right PICA not visualized. AICA, superior cerebellar, and posterior cerebral arteries are patent without significant stenosis  Internal carotid artery is widely patent bilaterally. Hypoplastic right A1 segment, a normal variant. Both A2 segments are supplied from the left without stenosis. Middle cerebral arteries are patent bilaterally.  Negative for cerebral aneurysm.  IMPRESSION: Negative for acute infarct. Mild chronic microvascular ischemic change in the white matter and pons  Sphenoid sinusitis  Negative MRA head   Electronically Signed   By: Franchot Gallo M.D.   On: 08/15/2014 09:06   US Paracentesis  08/16/2014   INDICATION: Ascites; cirrhosis  EXAM: ULTRASOUND-GUIDED PARACENTESIS  COMPARISON:  None.  MEDICATIONS: 10 cc 1% lidocaine  COMPLICATIONS: None immediate  TECHNIQUE: Informed written consent was obtained from the patient after a discussion of the risks, benefits and alternatives to treatment. A timeout was performed prior to the initiation of the procedure.  Initial ultrasound scanning demonstrates a moderate amount of ascites within the right lower abdominal quadrant. The right lower abdomen was prepped and draped in the usual sterile fashion. 1% lidocaine with epinephrine was used for local anesthesia. Under direct ultrasound guidance, a 19 gauge, 7-cm, Yueh catheter was introduced. An ultrasound image was saved for documentation purposed. The paracentesis was performed. The catheter was removed and a  dressing was applied. The patient tolerated the procedure well without immediate post procedural complication.  FINDINGS: A total of approximately 2 liters of yellow fluid was removed. Samples were sent to the laboratory as requested by the clinical team.  IMPRESSION: Successful ultrasound-guided paracentesis yielding 2 liters of peritoneal fluid.  2 liters maximum per MD.  Read by:  [facility]   Electronically Signed   By: Daryll Brod M.D.   On: 08/16/2014 14:38   Dg Chest Port 1 View  08/14/2014   CLINICAL DATA:  Shortness of Breath, history of cirrhosis  EXAM: PORTABLE  CHEST - 1 VIEW  COMPARISON:  10/17/2013  FINDINGS: Cardiomediastinal silhouette is stable. Mild interstitial prominence bilaterally without pulmonary edema. No segmental infiltrate.  IMPRESSION: Mild interstitial prominence bilaterally without convincing pulmonary edema. No segmental infiltrate.   Electronically Signed   By: Lahoma Crocker M.D.   On: 08/14/2014 17:54    ASSESSMENT/PLAN:  Physical deconditioning - for home health PT, OT, skilled nurse, social worker and home health aide.  TIA - stable; continue aspirin 81 mg by mouth daily and Lipitor 20 mg by mouth daily; follow-up with Dr. Leonie Man, neurology Cirrhosis of liver (NASH) with ascites -  S/P paracenteses, 2 L removed on 1/28; follow-up with Dr. Ethlyn Daniels, gastroenterology ; continue Lasix 40 mg by mouth daily and Aldactone 100 mg PO daily; monitor BMP and ammonia level @ PCP in 1 week; Lactulose 30 gm/45 ml PO TID recently added Depression - mood is stable; continue Lexapro 20 mg by mouth daily and Remeron 15 mg by mouth daily at bedtime Diabetes mellitus, type II - hemoglobin A1c 6; continue metformin 1000 mg by mouth twice a day and CBG twice a day Right upper lobe nodule - for repeat CT of chest/lungs Thrombocytopenia - platelet 58; monitor platelet count in 1 week @ PCP's office Hyperlipidemia - continue Lipitor 20 mg by mouth daily Hypertension - Inderal was  recently discontinued due to hypotension and bradycardia    I have filled out patient's discharge paperwork and written prescriptions.  Patient will receive home health PT, OT, Social worker, Nursing and CNA.  Total discharge time: Greater than 30 minutes  Discharge time involved coordination of the discharge process with social worker, nursing staff and therapy department. Medical justification for home health services verified.    [facility], NP [employer] [phone]

## 2014-09-22 NOTE — Progress Notes (Signed)
Patient ID: Garrett Turner, male   DOB: 09-29-1950, 64 y.o.   MRN: [id number]      [facility]   PCP: Lorayne Marek, MD  No Known Allergies  Chief Complaint  Patient presents with  . New Admit To SNF     HPI:  64 year old patient is here for short term rehabilitation post hospital admission from 08/14/14-08/24/14 with dyspnea and right sided weakness and leg swelling. Stroke workup was negative including ct head, mri brain, carotid doppler and echocardiogram. There were concerns for TIA vs neurological symptom in setting of hypotension. He was noted to have ascites and underwent paracentesis. 2 litre fluid was removed. He was also treated for pneumonia. Psychiatry evaluated and treated patient for depression. He has had 1 witnessed fall in facility with dizziness He has PMH of DM2, HTN, with NASH cirrhosis. He is seen in his room today. He denies any dizziness today. He has had 2 bowel movement this am and had 4 yesterday and would like his medication adjusted. Recently his inderal and lasix dosing was reduced with low bp readings.  Review of Systems:  Constitutional: Negative for fever, chills, diaphoresis.  HENT: Negative for headache, congestion, difficulty swallowing.   Eyes: Negative for eye pain, blurred vision, double vision and discharge.  Respiratory: Negative for cough. Has some shortness of breath with exertion. Cardiovascular: Negative for chest pain, palpitations  Gastrointestinal: Negative for heartburn, nausea, vomiting, abdominal pain. Had a bowel movement this am Genitourinary: Negative for dysuria Musculoskeletal: Negative for falls Skin: Negative for itching, rash.  Neurological: Negative for dizziness, tingling, focal weakness Psychiatric/Behavioral: positive for depression   Past Medical History  Diagnosis Date  . Diabetes mellitus   . Hypertension   . High cholesterol   . Anxiety disorder   . Pancytopenia   . Inguinal  hernia     Left  . Cirrhosis   . UTI (lower urinary tract infection)   . Depression   . Anxiety   . Leg cramps    Past Surgical History  Procedure Laterality Date  . Tonsillectomy    . Esophagogastroduodenoscopy  11/04/2011    Procedure: ESOPHAGOGASTRODUODENOSCOPY (EGD);  Surgeon: Arta Silence, MD;  Location: [facility];  Service: Endoscopy;  Laterality: N/A;  . Colonoscopy  11/04/2011    Procedure: COLONOSCOPY;  Surgeon: Arta Silence, MD;  Location: [facility];  Service: Endoscopy;  Laterality: N/A;  . Vasectomy  1985   Social History:   reports that he has never smoked. He has quit using smokeless tobacco. He reports that he drinks about 1.1 oz of alcohol per week. He reports that he does not use illicit drugs.  Family History  Problem Relation Age of Onset  . Coronary artery disease    . Hypertension    . Heart failure Father   . Hypertension Father   . Heart disease Father   . Heart failure Mother   . Hypertension Mother   . Heart disease Mother   . Stroke Paternal Grandmother   . Obesity Daughter     Medications: Patient's Medications  New Prescriptions   No medications on file  Previous Medications   ASPIRIN EC 81 MG EC TABLET    Take 1 tablet (81 mg total) by mouth daily.   ATORVASTATIN (LIPITOR) 20 MG TABLET    Take 1 tablet (20 mg total) by mouth daily.   DIPHENHYDRAMINE-ZINC ACETATE (BENADRYL) CREAM    Apply topically 3 (three) times daily as needed for itching.  ESCITALOPRAM (LEXAPRO) 20 MG TABLET    Take 1 tablet (20 mg total) by mouth daily.   FUROSEMIDE (LASIX) 40 MG TABLET    Take 1 tablet (40 mg total) by mouth daily.   HYDROCORTISONE (ANUSOL-HC) 2.5 % RECTAL CREAM    Apply topically 2 (two) times daily.   MIRTAZAPINE (REMERON SOL-TAB) 15 MG DISINTEGRATING TABLET    Take 1 tablet (15 mg total) by mouth at bedtime.   PROPRANOLOL (INDERAL) 10 MG TABLET    Take 1 tablet (10 mg total) by mouth 2 (two) times daily.   SPIRONOLACTONE (ALDACTONE) 100 MG  TABLET    Take 1 tablet (100 mg total) by mouth daily.   TRAMADOL (ULTRAM) 50 MG TABLET    Take 1 tablet (50 mg total) by mouth every 6 (six) hours as needed for moderate pain.  Modified Medications   No medications on file  Discontinued Medications   No medications on file     Physical Exam: Filed Vitals:   09/05/14 2205  BP: 104/60  Pulse: 64  Temp: 98.3 F (36.8 C)  Resp: 16  SpO2: 96%    General- elderly male, in no acute distress Head- normocephalic, atraumatic Throat- moist mucus membrane Eyes- no pallor, no icterus, no discharge, normal conjunctiva, normal sclera Neck- no cervical lymphadenopathy Cardiovascular- normal s1,s2, no murmurs, 1+ leg edema Respiratory- bilateral decreased air entry, shortness of breath in between sentences, no wheeze, no rhonchi, no crackles, no use of accessory muscles Abdomen- bowel sounds present, soft, non tender Musculoskeletal- able to move all 4 extremities, generalized weakness Neurological- no focal deficit Skin- warm and dry Psychiatry- alert and oriented to person, place and time, normal mood and affect    Labs reviewed: Basic Metabolic Panel:  Recent Labs  08/17/14 0500  08/19/14 0355 08/20/14 0730 08/22/14 0614  NA 136  < > 134* 133* 134*  K 3.8  < > 4.0 4.2 4.3  CL 105  < > 105 106 105  CO2 27  < > 24 23 25   GLUCOSE 115*  < > 139* 115* 112*  BUN 9  < > 9 12 9   CREATININE 0.94  < > 0.86 0.92 0.98  CALCIUM 8.1*  < > 8.0* 7.9* 8.1*  MG 1.6  --   --   --   --   < > = values in this interval not displayed. Liver Function Tests:  Recent Labs  08/18/14 0627 08/19/14 0355 08/22/14 0614  AST 55* 63* 62*  ALT 27 27 26   ALKPHOS 103 107 155*  BILITOT 2.5* 2.3* 2.5*  PROT 6.4 5.8* 6.2  ALBUMIN 2.2* 2.2* 2.3*    Recent Labs  05/02/14 1842  LIPASE 46    Recent Labs  08/17/14 0500  AMMONIA 27   CBC:  Recent Labs  10/17/13 1929 05/02/14 1842  08/14/14 2209  08/19/14 0355 08/20/14 0730  08/22/14 0614  WBC 3.1* 6.5  < > 3.7*  < > 3.7* 4.0 3.5*  NEUTROABS 1.6* 5.5  --  2.2  --   --   --   --   HGB 9.7* 10.2*  < > 10.2*  < > 9.8* 9.7* 10.2*  HCT 27.9* 29.4*  < > 29.8*  < > 28.6* 27.8* 29.1*  MCV 94.6 92.7  < > 92.0  < > 91.4 89.4 90.1  PLT 44* 102*  < > 48*  < > 54* 55* 49*  < > = values in this interval not displayed.   Assessment/Plan  Physical deconditioning Will have him work with physical therapy and occupational therapy team to help with gait training and muscle strengthening exercises.fall precautions. Skin care. Encourage to be out of bed.   Cirrhosis of liver  with ascites. S/P paracenteses. Has f/u with GI. Due to increased leg edema, will change his lasix back to 40 mg daily for now with holding parameters. Monitor bmp. Continue aldactone 100 mg daily. Change lactulose to 30 cc bid from tid with his increased frequency of bowel movement. Adjust further if needed with goal for bowel movement 2-3 a day.  TIA stable. continue aspirin 81 mg daily and Lipitor 20 mg daily. Has f/u with neurology  Pneumonia  Completed course of levaquin, monitor clinically  RUL lung nodule Pending repeat ct scan  HTN Monitor bp, continue inderal 10 mg daily for now  Depression  Stable this visit. Continue Lexapro 20 mg daily and Remeron 15 mg daily   Diabetes mellitus, type II continue metformin 1000 mg bid and monitor cbg  Thrombocytopenia No bleeding reported. monitor platelet count   Goals of care: short term rehabilitation   Labs/tests ordered: cbc, bmp, ct chest  Family/ staff Communication: reviewed care plan with patient and nursing supervisor    Blanchie Serve, MD  [place] [phone] (Monday-Friday 8 am - 5 pm) [phone] (afterhours)

## 2014-09-28 ENCOUNTER — Telehealth: Payer: Self-pay

## 2014-09-28 ENCOUNTER — Telehealth: Payer: Self-pay | Admitting: General Practice

## 2014-09-28 NOTE — Telephone Encounter (Signed)
Spoke with Tiffany visiting nurse She states when patient is working with physical therapist His o2 stats are dropping and requesting dr to write order for 02 Instructed patient would need to be seen by dr and than evaluated by specialist To determine if oxygen is required and how many liters

## 2014-09-28 NOTE — Telephone Encounter (Signed)
Tiffany, patient's home health nurse from [facility], calling to report changes in patient's health. Tiffany states that when patient ambulates, his O2 stats drop below 80. Jonelle Sidle is recommending that patient have Oxygen tank. Tiffany also states that patient was prescribed lactulose while in skilled facility, and that he needs that prescription  Refilled. Please follow up with Tiffany at the number provided.

## 2014-10-01 ENCOUNTER — Ambulatory Visit: Payer: Self-pay | Admitting: Neurology

## 2014-10-04 ENCOUNTER — Inpatient Hospital Stay (HOSPITAL_COMMUNITY): Payer: Medicare Other

## 2014-10-04 ENCOUNTER — Encounter: Payer: Self-pay | Admitting: Internal Medicine

## 2014-10-04 ENCOUNTER — Emergency Department (HOSPITAL_COMMUNITY): Payer: Medicare Other

## 2014-10-04 ENCOUNTER — Ambulatory Visit (HOSPITAL_BASED_OUTPATIENT_CLINIC_OR_DEPARTMENT_OTHER): Payer: Medicare Other | Admitting: Internal Medicine

## 2014-10-04 ENCOUNTER — Inpatient Hospital Stay (HOSPITAL_COMMUNITY)
Admission: EM | Admit: 2014-10-04 | Discharge: 2014-10-09 | DRG: 871 | Disposition: A | Discharge status: Home Health | Payer: Medicare Other | Attending: Internal Medicine | Admitting: Internal Medicine

## 2014-10-04 ENCOUNTER — Encounter (HOSPITAL_COMMUNITY): Payer: Self-pay | Admitting: Emergency Medicine

## 2014-10-04 VITALS — BP 124/76 | HR 81 | Temp 97.6°F | Resp 15 | Wt 161.8 lb

## 2014-10-04 DIAGNOSIS — E871 Hypo-osmolality and hyponatremia: Secondary | ICD-10-CM | POA: Diagnosis present

## 2014-10-04 DIAGNOSIS — R188 Other ascites: Secondary | ICD-10-CM

## 2014-10-04 DIAGNOSIS — E872 Acidosis: Secondary | ICD-10-CM | POA: Diagnosis present

## 2014-10-04 DIAGNOSIS — Z8249 Family history of ischemic heart disease and other diseases of the circulatory system: Secondary | ICD-10-CM | POA: Diagnosis not present

## 2014-10-04 DIAGNOSIS — E139 Other specified diabetes mellitus without complications: Secondary | ICD-10-CM

## 2014-10-04 DIAGNOSIS — Z87891 Personal history of nicotine dependence: Secondary | ICD-10-CM

## 2014-10-04 DIAGNOSIS — R6520 Severe sepsis without septic shock: Secondary | ICD-10-CM

## 2014-10-04 DIAGNOSIS — Y95 Nosocomial condition: Secondary | ICD-10-CM | POA: Diagnosis present

## 2014-10-04 DIAGNOSIS — J9601 Acute respiratory failure with hypoxia: Secondary | ICD-10-CM | POA: Diagnosis present

## 2014-10-04 DIAGNOSIS — I639 Cerebral infarction, unspecified: Secondary | ICD-10-CM

## 2014-10-04 DIAGNOSIS — K219 Gastro-esophageal reflux disease without esophagitis: Secondary | ICD-10-CM | POA: Diagnosis present

## 2014-10-04 DIAGNOSIS — A403 Sepsis due to Streptococcus pneumoniae: Secondary | ICD-10-CM | POA: Diagnosis not present

## 2014-10-04 DIAGNOSIS — E119 Type 2 diabetes mellitus without complications: Secondary | ICD-10-CM | POA: Insufficient documentation

## 2014-10-04 DIAGNOSIS — A419 Sepsis, unspecified organism: Principal | ICD-10-CM | POA: Diagnosis present

## 2014-10-04 DIAGNOSIS — Z7982 Long term (current) use of aspirin: Secondary | ICD-10-CM | POA: Diagnosis not present

## 2014-10-04 DIAGNOSIS — K7460 Unspecified cirrhosis of liver: Secondary | ICD-10-CM

## 2014-10-04 DIAGNOSIS — J189 Pneumonia, unspecified organism: Secondary | ICD-10-CM | POA: Diagnosis present

## 2014-10-04 DIAGNOSIS — R911 Solitary pulmonary nodule: Secondary | ICD-10-CM | POA: Diagnosis present

## 2014-10-04 DIAGNOSIS — F329 Major depressive disorder, single episode, unspecified: Secondary | ICD-10-CM | POA: Insufficient documentation

## 2014-10-04 DIAGNOSIS — A401 Sepsis due to streptococcus, group B: Secondary | ICD-10-CM | POA: Diagnosis not present

## 2014-10-04 DIAGNOSIS — R6521 Severe sepsis with septic shock: Secondary | ICD-10-CM | POA: Diagnosis present

## 2014-10-04 DIAGNOSIS — Z8701 Personal history of pneumonia (recurrent): Secondary | ICD-10-CM

## 2014-10-04 DIAGNOSIS — J90 Pleural effusion, not elsewhere classified: Secondary | ICD-10-CM | POA: Diagnosis present

## 2014-10-04 DIAGNOSIS — E43 Unspecified severe protein-calorie malnutrition: Secondary | ICD-10-CM | POA: Diagnosis present

## 2014-10-04 DIAGNOSIS — B951 Streptococcus, group B, as the cause of diseases classified elsewhere: Secondary | ICD-10-CM | POA: Diagnosis present

## 2014-10-04 DIAGNOSIS — K7581 Nonalcoholic steatohepatitis (NASH): Secondary | ICD-10-CM | POA: Diagnosis present

## 2014-10-04 DIAGNOSIS — Z79899 Other long term (current) drug therapy: Secondary | ICD-10-CM | POA: Diagnosis not present

## 2014-10-04 DIAGNOSIS — E780 Pure hypercholesterolemia: Secondary | ICD-10-CM

## 2014-10-04 DIAGNOSIS — R0902 Hypoxemia: Secondary | ICD-10-CM

## 2014-10-04 DIAGNOSIS — R7881 Bacteremia: Secondary | ICD-10-CM | POA: Diagnosis not present

## 2014-10-04 DIAGNOSIS — I10 Essential (primary) hypertension: Secondary | ICD-10-CM | POA: Diagnosis present

## 2014-10-04 DIAGNOSIS — E785 Hyperlipidemia, unspecified: Secondary | ICD-10-CM | POA: Diagnosis present

## 2014-10-04 DIAGNOSIS — R0602 Shortness of breath: Secondary | ICD-10-CM

## 2014-10-04 DIAGNOSIS — F418 Other specified anxiety disorders: Secondary | ICD-10-CM | POA: Diagnosis present

## 2014-10-04 DIAGNOSIS — F419 Anxiety disorder, unspecified: Secondary | ICD-10-CM | POA: Insufficient documentation

## 2014-10-04 DIAGNOSIS — D696 Thrombocytopenia, unspecified: Secondary | ICD-10-CM | POA: Diagnosis not present

## 2014-10-04 DIAGNOSIS — R091 Pleurisy: Secondary | ICD-10-CM

## 2014-10-04 HISTORY — DX: Hepatitis a without hepatic coma: B15.9

## 2014-10-04 HISTORY — DX: Pneumonia, unspecified organism: J18.9

## 2014-10-04 HISTORY — DX: Unspecified cirrhosis of liver: K74.60

## 2014-10-04 HISTORY — DX: Type 2 diabetes mellitus without complications: E11.9

## 2014-10-04 HISTORY — DX: Nonalcoholic steatohepatitis (NASH): K75.81

## 2014-10-04 LAB — CBC
HCT: 28.2 % — ABNORMAL LOW (ref 39.0–52.0)
HCT: 24.6 % — ABNORMAL LOW (ref 39.0–52.0)
HEMOGLOBIN: 8.6 g/dL — AB (ref 13.0–17.0)
Hemoglobin: 9.8 g/dL — ABNORMAL LOW (ref 13.0–17.0)
MCH: 31.6 pg (ref 26.0–34.0)
MCH: 31.9 pg (ref 26.0–34.0)
MCHC: 34.8 g/dL (ref 30.0–36.0)
MCHC: 35 g/dL (ref 30.0–36.0)
MCV: 91 fL (ref 78.0–100.0)
MCV: 91.1 fL (ref 78.0–100.0)
PLATELETS: 51 10*3/uL — AB (ref 150–400)
Platelets: 43 10*3/uL — ABNORMAL LOW (ref 150–400)
RBC: 3.1 MIL/uL — ABNORMAL LOW (ref 4.22–5.81)
RBC: 2.7 MIL/uL — ABNORMAL LOW (ref 4.22–5.81)
RDW: 17 % — AB (ref 11.5–15.5)
RDW: 17.2 % — ABNORMAL HIGH (ref 11.5–15.5)
WBC: 9.9 10*3/uL (ref 4.0–10.5)
WBC: 7.2 10*3/uL (ref 4.0–10.5)

## 2014-10-04 LAB — APTT: aPTT: 38 seconds — ABNORMAL HIGH (ref 24–37)

## 2014-10-04 LAB — CBC WITH DIFFERENTIAL/PLATELET
Basophils Absolute: 0 10*3/uL (ref 0.0–0.1)
Basophils Relative: 0 % (ref 0–1)
Eosinophils Absolute: 0.1 10*3/uL (ref 0.0–0.7)
Eosinophils Relative: 1 % (ref 0–5)
HCT: 32 % — ABNORMAL LOW (ref 39.0–52.0)
Hemoglobin: 11 g/dL — ABNORMAL LOW (ref 13.0–17.0)
Lymphocytes Relative: 7 % — ABNORMAL LOW (ref 12–46)
Lymphs Abs: 0.7 10*3/uL (ref 0.7–4.0)
MCH: 31.8 pg (ref 26.0–34.0)
MCHC: 34.4 g/dL (ref 30.0–36.0)
MCV: 92.5 fL (ref 78.0–100.0)
Monocytes Absolute: 0.6 10*3/uL (ref 0.1–1.0)
Monocytes Relative: 6 % (ref 3–12)
NEUTROS PCT: 86 % — AB (ref 43–77)
Neutro Abs: 8.3 10*3/uL — ABNORMAL HIGH (ref 1.7–7.7)
Platelets: 71 10*3/uL — ABNORMAL LOW (ref 150–400)
RBC: 3.46 MIL/uL — ABNORMAL LOW (ref 4.22–5.81)
RDW: 17.1 % — ABNORMAL HIGH (ref 11.5–15.5)
WBC: 9.7 10*3/uL (ref 4.0–10.5)

## 2014-10-04 LAB — COMPREHENSIVE METABOLIC PANEL WITH GFR
ALT: 33 U/L (ref 0–53)
AST: 54 U/L — ABNORMAL HIGH (ref 0–37)
Albumin: 2.4 g/dL — ABNORMAL LOW (ref 3.5–5.2)
Alkaline Phosphatase: 101 U/L (ref 39–117)
Anion gap: 13 (ref 5–15)
BUN: 21 mg/dL (ref 6–23)
CO2: 15 mmol/L — ABNORMAL LOW (ref 19–32)
Calcium: 8.2 mg/dL — ABNORMAL LOW (ref 8.4–10.5)
Chloride: 105 mmol/L (ref 96–112)
Creatinine, Ser: 1.18 mg/dL (ref 0.50–1.35)
GFR calc Af Amer: 73 mL/min — ABNORMAL LOW
GFR calc non Af Amer: 63 mL/min — ABNORMAL LOW
Glucose, Bld: 153 mg/dL — ABNORMAL HIGH (ref 70–99)
Potassium: 4.3 mmol/L (ref 3.5–5.1)
Sodium: 133 mmol/L — ABNORMAL LOW (ref 135–145)
Total Bilirubin: 4.1 mg/dL — ABNORMAL HIGH (ref 0.3–1.2)
Total Protein: 7.2 g/dL (ref 6.0–8.3)

## 2014-10-04 LAB — COMPREHENSIVE METABOLIC PANEL
ALK PHOS: 118 U/L — AB (ref 39–117)
ALT: 36 U/L (ref 0–53)
ANION GAP: 17 — AB (ref 5–15)
AST: 66 U/L — AB (ref 0–37)
Albumin: 2.7 g/dL — ABNORMAL LOW (ref 3.5–5.2)
BUN: 20 mg/dL (ref 6–23)
CO2: 14 mmol/L — ABNORMAL LOW (ref 19–32)
Calcium: 9 mg/dL (ref 8.4–10.5)
Chloride: 105 mmol/L (ref 96–112)
Creatinine, Ser: 1.28 mg/dL (ref 0.50–1.35)
GFR calc Af Amer: 66 mL/min — ABNORMAL LOW (ref >90)
GFR, EST NON AFRICAN AMERICAN: 57 mL/min — AB (ref >90)
GLUCOSE: 139 mg/dL — AB (ref 70–99)
Potassium: 4.6 mmol/L (ref 3.5–5.1)
SODIUM: 136 mmol/L (ref 135–145)
TOTAL PROTEIN: 7.9 g/dL (ref 6.0–8.3)
Total Bilirubin: 3.7 mg/dL — ABNORMAL HIGH (ref 0.3–1.2)

## 2014-10-04 LAB — D-DIMER, QUANTITATIVE: D-Dimer, Quant: >20 ug/mL-FEU — ABNORMAL HIGH (ref 0.00–0.48)

## 2014-10-04 LAB — PROTIME-INR
INR: 1.58 — ABNORMAL HIGH (ref 0.00–1.49)
INR: 1.66 — ABNORMAL HIGH (ref 0.00–1.49)
Prothrombin Time: 19 seconds — ABNORMAL HIGH (ref 11.6–15.2)
Prothrombin Time: 19.8 s — ABNORMAL HIGH (ref 11.6–15.2)

## 2014-10-04 LAB — FIBRINOGEN: FIBRINOGEN: 324 mg/dL (ref 204–475)

## 2014-10-04 LAB — TYPE AND SCREEN
ABO/RH(D): A POS
Antibody Screen: NEGATIVE

## 2014-10-04 LAB — CBG MONITORING, ED
Glucose-Capillary: 114 mg/dL — ABNORMAL HIGH (ref 70–99)
Glucose-Capillary: 144 mg/dL — ABNORMAL HIGH (ref 70–99)

## 2014-10-04 LAB — MRSA PCR SCREENING: MRSA by PCR: NEGATIVE

## 2014-10-04 LAB — INFLUENZA PANEL BY PCR (TYPE A & B)
H1N1 flu by pcr: NOT DETECTED
INFLBPCR: NEGATIVE
Influenza A By PCR: NEGATIVE

## 2014-10-04 LAB — LACTIC ACID, PLASMA
Lactic Acid, Venous: 4.2 mmol/L (ref 0.5–2.0)
Lactic Acid, Venous: 2.6 mmol/L (ref 0.5–2.0)

## 2014-10-04 LAB — I-STAT CG4 LACTIC ACID, ED
LACTIC ACID, VENOUS: 4.46 mmol/L — AB (ref 0.5–2.0)
LACTIC ACID, VENOUS: 2.45 mmol/L — AB (ref 0.5–2.0)

## 2014-10-04 LAB — ABO/RH: ABO/RH(D): A POS

## 2014-10-04 LAB — TROPONIN I
TROPONIN I: 0.04 ng/mL — AB (ref <0.031)
Troponin I: <0.03 ng/mL
Troponin I: <0.03 ng/mL (ref <0.031)

## 2014-10-04 LAB — LACTATE DEHYDROGENASE: LDH: 218 U/L (ref 94–250)

## 2014-10-04 LAB — BRAIN NATRIURETIC PEPTIDE: B Natriuretic Peptide: 65.8 pg/mL (ref 0.0–100.0)

## 2014-10-04 LAB — GLUCOSE, POCT (MANUAL RESULT ENTRY): POC Glucose: 104 mg/dl — AB (ref 70–99)

## 2014-10-04 LAB — GLUCOSE, CAPILLARY: Glucose-Capillary: 204 mg/dL — ABNORMAL HIGH (ref 70–99)

## 2014-10-04 LAB — PROCALCITONIN: Procalcitonin: 1.16 ng/mL

## 2014-10-04 LAB — POCT GLYCOSYLATED HEMOGLOBIN (HGB A1C): Hemoglobin A1C: 6.2

## 2014-10-04 LAB — AMMONIA: Ammonia: 48 umol/L — ABNORMAL HIGH (ref 11–32)

## 2014-10-04 MED ORDER — INSULIN ASPART 100 UNIT/ML ~~LOC~~ SOLN
0.0000 [IU] | Freq: Three times a day (TID) | SUBCUTANEOUS | Status: DC
  Administered 2014-10-05: 1 [IU] via SUBCUTANEOUS
  Administered 2014-10-05: 3 [IU] via SUBCUTANEOUS
  Administered 2014-10-06: 1 [IU] via SUBCUTANEOUS
  Administered 2014-10-06: 2 [IU] via SUBCUTANEOUS
  Administered 2014-10-06: 3 [IU] via SUBCUTANEOUS
  Administered 2014-10-07: 1 [IU] via SUBCUTANEOUS
  Administered 2014-10-07 – 2014-10-08 (×3): 2 [IU] via SUBCUTANEOUS
  Administered 2014-10-08: 3 [IU] via SUBCUTANEOUS
  Administered 2014-10-08 – 2014-10-09 (×2): 2 [IU] via SUBCUTANEOUS
  Administered 2014-10-09: 1 [IU] via SUBCUTANEOUS

## 2014-10-04 MED ORDER — SODIUM CHLORIDE 0.9 % IV BOLUS (SEPSIS)
500.0000 mL | INTRAVENOUS | Status: AC
  Administered 2014-10-04: 500 mL via INTRAVENOUS

## 2014-10-04 MED ORDER — ALUM & MAG HYDROXIDE-SIMETH 200-200-20 MG/5ML PO SUSP: 30.0000 mL | Freq: Four times a day (QID) | ORAL | Status: DC | PRN

## 2014-10-04 MED ORDER — HYDROCODONE-ACETAMINOPHEN 5-325 MG PO TABS: 1.0000 | ORAL_TABLET | ORAL | Status: DC | PRN

## 2014-10-04 MED ORDER — IOHEXOL 350 MG/ML SOLN
80.0000 mL | Freq: Once | INTRAVENOUS | Status: AC | PRN
  Administered 2014-10-04: 80 mL via INTRAVENOUS

## 2014-10-04 MED ORDER — DEXTROSE 5 % IV SOLN
2.0000 g | Freq: Once | INTRAVENOUS | Status: AC
  Administered 2014-10-04: 2 g via INTRAVENOUS
  Filled 2014-10-04: qty 2

## 2014-10-04 MED ORDER — SODIUM CHLORIDE 0.9 % IV BOLUS (SEPSIS)
1000.0000 mL | Freq: Once | INTRAVENOUS | Status: AC
  Administered 2014-10-04: 1000 mL via INTRAVENOUS

## 2014-10-04 MED ORDER — DEXTROSE 5 % IV SOLN
1.0000 g | Freq: Three times a day (TID) | INTRAVENOUS | Status: DC
  Administered 2014-10-04 – 2014-10-07 (×9): 1 g via INTRAVENOUS
  Filled 2014-10-04 (×12): qty 1

## 2014-10-04 MED ORDER — VANCOMYCIN HCL IN DEXTROSE 1-5 GM/200ML-% IV SOLN
1000.0000 mg | Freq: Two times a day (BID) | INTRAVENOUS | Status: DC
  Administered 2014-10-05 – 2014-10-07 (×5): 1000 mg via INTRAVENOUS
  Filled 2014-10-04 (×8): qty 200

## 2014-10-04 MED ORDER — ONDANSETRON HCL 4 MG PO TABS: 4.0000 mg | ORAL_TABLET | Freq: Four times a day (QID) | ORAL | Status: DC | PRN

## 2014-10-04 MED ORDER — INSULIN ASPART 100 UNIT/ML ~~LOC~~ SOLN
0.0000 [IU] | Freq: Every day | SUBCUTANEOUS | Status: DC
  Administered 2014-10-04: 2 [IU] via SUBCUTANEOUS

## 2014-10-04 MED ORDER — OSELTAMIVIR PHOSPHATE 75 MG PO CAPS
75.0000 mg | ORAL_CAPSULE | Freq: Two times a day (BID) | ORAL | Status: DC
  Administered 2014-10-04: 75 mg via ORAL
  Filled 2014-10-04 (×5): qty 1

## 2014-10-04 MED ORDER — PROPRANOLOL HCL 10 MG PO TABS
10.0000 mg | ORAL_TABLET | Freq: Two times a day (BID) | ORAL | Status: DC
  Administered 2014-10-04: 10 mg via ORAL
  Filled 2014-10-04 (×3): qty 1

## 2014-10-04 MED ORDER — SODIUM CHLORIDE 0.9 % IV SOLN
INTRAVENOUS | Status: DC
  Administered 2014-10-04: 23:00:00 via INTRAVENOUS
  Administered 2014-10-04: 10 mL/h via INTRAVENOUS

## 2014-10-04 MED ORDER — HEPARIN SODIUM (PORCINE) 5000 UNIT/ML IJ SOLN
5000.0000 [IU] | Freq: Three times a day (TID) | INTRAMUSCULAR | Status: DC
  Administered 2014-10-04 – 2014-10-05 (×2): 5000 [IU] via SUBCUTANEOUS
  Filled 2014-10-04 (×5): qty 1

## 2014-10-04 MED ORDER — ASPIRIN 81 MG PO CHEW
81.0000 mg | CHEWABLE_TABLET | Freq: Every day | ORAL | Status: DC
  Administered 2014-10-04 – 2014-10-05 (×2): 81 mg via ORAL
  Filled 2014-10-04 (×3): qty 1

## 2014-10-04 MED ORDER — PIPERACILLIN-TAZOBACTAM 3.375 G IVPB
3.3750 g | Freq: Once | INTRAVENOUS | Status: AC
  Administered 2014-10-04: 3.375 g via INTRAVENOUS
  Filled 2014-10-04: qty 50

## 2014-10-04 MED ORDER — VANCOMYCIN HCL IN DEXTROSE 1-5 GM/200ML-% IV SOLN: 1000.0000 mg | Freq: Once | INTRAVENOUS | Status: DC

## 2014-10-04 MED ORDER — ONDANSETRON HCL 4 MG/2ML IJ SOLN: 4.0000 mg | Freq: Four times a day (QID) | INTRAMUSCULAR | Status: DC | PRN

## 2014-10-04 MED ORDER — GUAIFENESIN-DM 100-10 MG/5ML PO SYRP
5.0000 mL | ORAL_SOLUTION | ORAL | Status: DC | PRN
  Filled 2014-10-04: qty 5

## 2014-10-04 MED ORDER — SODIUM CHLORIDE 0.9 % IJ SOLN
3.0000 mL | Freq: Two times a day (BID) | INTRAMUSCULAR | Status: DC
  Administered 2014-10-05 – 2014-10-09 (×7): 3 mL via INTRAVENOUS

## 2014-10-04 MED ORDER — SODIUM CHLORIDE 0.9 % IV BOLUS (SEPSIS)
1000.0000 mL | INTRAVENOUS | Status: AC
  Administered 2014-10-04 (×2): 1000 mL via INTRAVENOUS

## 2014-10-04 MED ORDER — ESCITALOPRAM OXALATE 20 MG PO TABS
20.0000 mg | ORAL_TABLET | Freq: Every day | ORAL | Status: DC
  Administered 2014-10-04 – 2014-10-05 (×2): 20 mg via ORAL
  Filled 2014-10-04 (×2): qty 1

## 2014-10-04 MED ORDER — LACTULOSE 10 GM/15ML PO SOLN
20.0000 g | Freq: Two times a day (BID) | ORAL | Status: DC
  Administered 2014-10-04 – 2014-10-09 (×9): 20 g via ORAL
  Filled 2014-10-04 (×14): qty 30

## 2014-10-04 MED ORDER — VANCOMYCIN HCL IN DEXTROSE 1-5 GM/200ML-% IV SOLN
1000.0000 mg | Freq: Once | INTRAVENOUS | Status: AC
  Administered 2014-10-04: 1000 mg via INTRAVENOUS
  Filled 2014-10-04: qty 200

## 2014-10-04 NOTE — Progress Notes (Signed)
ANTIBIOTIC CONSULT NOTE - INITIAL  Pharmacy Consult for cefepime and vancomycin Indication: HCAP  No Known Allergies  Patient Measurements: Height: 6\' 1"  (185.4 cm) Weight: 165 lb (74.844 kg) IBW/kg (Calculated) : 79.9   Vital Signs: Temp: 97.9 F (36.6 C) (03/17 1206) Temp Source: Oral (03/17 1206) BP: 124/70 mmHg (03/17 1300) Pulse Rate: 99 (03/17 1300) Intake/Output from previous day:   Intake/Output from this shift:    Labs:  Recent Labs  10/04/14 1125  WBC 9.7  HGB 11.0*  PLT 71*  CREATININE 1.28   Estimated Creatinine Clearance: 60.9 mL/min (by C-G formula based on Cr of 1.28). No results for input(s): VANCOTROUGH, VANCOPEAK, VANCORANDOM, GENTTROUGH, GENTPEAK, GENTRANDOM, TOBRATROUGH, TOBRAPEAK, TOBRARND, AMIKACINPEAK, AMIKACINTROU, AMIKACIN in the last 72 hours.   Microbiology: No results found for this or any previous visit (from the past 720 hour(s)).  Medical History: Past Medical History  Diagnosis Date  . Diabetes mellitus   . Hypertension   . High cholesterol   . Anxiety disorder   . Pancytopenia   . Inguinal hernia     Left  . Cirrhosis   . UTI (lower urinary tract infection)   . Depression   . Anxiety   . Leg cramps     Medications:  No abxs  Assessment: 64 yo M sent to ED from MD office. Pharmacy consulted to dose cefepime and vancomycin for HCAP.  Wt 74.8 kg, WBC 9.7, creat 1.28; Af. 3/17 CXR: interstitial infiltrates B, peripheral predominance, question infection or edema.   Cefepime 3/17>> vanc 3/17>>  3/17 BCx2>> 3/17 sputum>> 3/17 urinalysis>> 3/17 flu>>  Goal of Therapy:  Vancomycin trough level 15-20 mcg/ml  Plan:  -cefepime 2 mg IV x 1 in ED, then cefepime 1 gm IV q8h -vancomycin 1 mg IV x 1 in ED, then vancomycin 1 gm IV q12h -f/u renal fxn, wbc, temp, culture data -steady-state vancomycin trough as needed  [name], Pharm.D. [phone] 10/04/2014 1:47 PM

## 2014-10-04 NOTE — ED Notes (Signed)
CBG results given to [place]

## 2014-10-04 NOTE — ED Notes (Signed)
Lab called to notify nurse of lactic 4.2, doctor paged.

## 2014-10-04 NOTE — ED Notes (Signed)
Patient states was recently in nursing facility and he said his oxygen drops at times.   Patient states suffering from cirrhosis and has too much fluid build up in abdomen.   Patient states he went to his doctor this morning and they advised him to come here for additional work up.

## 2014-10-04 NOTE — ED Notes (Signed)
Pt transported to CT ?

## 2014-10-04 NOTE — Progress Notes (Signed)
Patient here for follow up Complains of SOB Weakness and not feeling well in general Requesting a prescription for oxygen o2 stats in office is between 82% and 85% Patient placed on two liters of oxygen in office

## 2014-10-04 NOTE — ED Notes (Signed)
Patient transported to X-ray 

## 2014-10-04 NOTE — ED Notes (Signed)
Lab results reported to [place].

## 2014-10-04 NOTE — Progress Notes (Signed)
MRN: [id number] Name: Garrett Turner  Sex: male Age: 64 y.o. DOB: August 31, 1950  Allergies: Review of patient's allergies indicates no known allergies.  Chief Complaint  Patient presents with  . Follow-up    HPI: Patient is 64 y.o. male who has to of hypertension hyperlipidemia cirrhosis was hospitalized last month and eventually was in nursing home comes today for followup complaining of shortness of breath, currently his oxygen saturation is 82 to 85 %, after 2 L oxygen his situation is around 90%, patient denies any chest pain denies any headache dizziness but is feeling tired.  Past Medical History  Diagnosis Date  . Diabetes mellitus   . Hypertension   . High cholesterol   . Anxiety disorder   . Pancytopenia   . Inguinal hernia     Left  . Cirrhosis   . UTI (lower urinary tract infection)   . Depression   . Anxiety   . Leg cramps     Past Surgical History  Procedure Laterality Date  . Tonsillectomy    . Esophagogastroduodenoscopy  11/04/2011    Procedure: ESOPHAGOGASTRODUODENOSCOPY (EGD);  Surgeon: Arta Silence, MD;  Location: [facility];  Service: Endoscopy;  Laterality: N/A;  . Colonoscopy  11/04/2011    Procedure: COLONOSCOPY;  Surgeon: Arta Silence, MD;  Location: [facility];  Service: Endoscopy;  Laterality: N/A;  . Vasectomy  1985      Medication List       This list is accurate as of: 10/04/14 10:41 AM.  Always use your most recent med list.               aspirin 81 MG EC tablet  Take 1 tablet (81 mg total) by mouth daily.     atorvastatin 20 MG tablet  Commonly known as:  LIPITOR  Take 1 tablet (20 mg total) by mouth daily.     diphenhydrAMINE-zinc acetate cream  Commonly known as:  BENADRYL  Apply topically 3 (three) times daily as needed for itching.     escitalopram 20 MG tablet  Commonly known as:  LEXAPRO  Take 1 tablet (20 mg total) by mouth daily.     furosemide 40 MG tablet  Commonly known as:  LASIX  Take 1 tablet (40  mg total) by mouth daily.     hydrocortisone 2.5 % rectal cream  Commonly known as:  ANUSOL-HC  Apply topically 2 (two) times daily.     losartan-hydrochlorothiazide 50-12.5 MG per tablet  Commonly known as:  HYZAAR  Take 1 tablet by mouth daily.     metFORMIN 1000 MG tablet  Commonly known as:  GLUCOPHAGE  Take 1,000 mg by mouth 2 (two) times daily with a meal.     mirtazapine 15 MG disintegrating tablet  Commonly known as:  REMERON SOL-TAB  Take 1 tablet (15 mg total) by mouth at bedtime.     propranolol 10 MG tablet  Commonly known as:  INDERAL  Take 1 tablet (10 mg total) by mouth 2 (two) times daily.     spironolactone 100 MG tablet  Commonly known as:  ALDACTONE  Take 1 tablet (100 mg total) by mouth daily.     traMADol 50 MG tablet  Commonly known as:  ULTRAM  Take 1 tablet (50 mg total) by mouth every 6 (six) hours as needed for moderate pain.     traZODone 50 MG tablet  Commonly known as:  DESYREL  Take 50 mg by mouth at bedtime.  Meds ordered this encounter  Medications  . traZODone (DESYREL) 50 MG tablet    Sig: Take 50 mg by mouth at bedtime.  Marland Kitchen losartan-hydrochlorothiazide (HYZAAR) 50-12.5 MG per tablet    Sig: Take 1 tablet by mouth daily.  . metFORMIN (GLUCOPHAGE) 1000 MG tablet    Sig: Take 1,000 mg by mouth 2 (two) times daily with a meal.    Immunization History  Administered Date(s) Administered  . Influenza Split 04/14/2012, 04/24/2013  . Influenza,inj,Quad PF,36+ Mos 06/19/2014  . Pneumococcal Polysaccharide-23 04/14/2012, 04/24/2013  . Td 04/04/2012    Family History  Problem Relation Age of Onset  . Coronary artery disease    . Hypertension    . Heart failure Father   . Hypertension Father   . Heart disease Father   . Heart failure Mother   . Hypertension Mother   . Heart disease Mother   . Stroke Paternal Grandmother   . Obesity Daughter     History  Substance Use Topics  . Smoking status: Never Smoker   . Smokeless  tobacco: Former Systems developer  . Alcohol Use: 1.1 oz/week    1 Glasses of wine, 1 Standard drinks or equivalent per week    Review of Systems   As noted in HPI  Filed Vitals:   10/04/14 0942  BP: 124/76  Pulse: 81  Temp: 97.6 F (36.4 C)  Resp: 15    Physical Exam  Physical Exam  Constitutional:  Patient is currently on oxygen, looks weaker short of breath  Cardiovascular: Normal rate and regular rhythm.   Pulmonary/Chest:  Decreased breath sounds on the left, no wheezing  Abdominal: There is no tenderness.  Musculoskeletal: He exhibits no edema.    CBC    Component Value Date/Time   WBC 3.5* 08/22/2014 0614   RBC 3.23* 08/22/2014 0614   RBC 3.07* 02/14/2014 1003   HGB 10.2* 08/22/2014 0614   HCT 29.1* 08/22/2014 0614   PLT 49* 08/22/2014 0614   MCV 90.1 08/22/2014 0614   LYMPHSABS 0.7 08/14/2014 2209   MONOABS 0.5 08/14/2014 2209   EOSABS 0.3 08/14/2014 2209   BASOSABS 0.0 08/14/2014 2209    CMP     Component Value Date/Time   NA 134* 08/22/2014 0614   K 4.3 08/22/2014 0614   CL 105 08/22/2014 0614   CO2 25 08/22/2014 0614   GLUCOSE 112* 08/22/2014 0614   BUN 9 08/22/2014 0614   CREATININE 0.98 08/22/2014 0614   CREATININE 1.25 02/14/2014 1003   CALCIUM 8.1* 08/22/2014 0614   PROT 6.2 08/22/2014 0614   ALBUMIN 2.3* 08/22/2014 0614   AST 62* 08/22/2014 0614   ALT 26 08/22/2014 0614   ALKPHOS 155* 08/22/2014 0614   BILITOT 2.5* 08/22/2014 0614   GFRNONAA 84* 08/22/2014 0614   GFRNONAA 60 02/14/2014 1003   GFRAA >90 08/22/2014 0614   GFRAA 69 02/14/2014 1003    Lab Results  Component Value Date/Time   CHOL 199 08/15/2014 06:33 AM    No components found for: HGA1C  Lab Results  Component Value Date/Time   AST 62* 08/22/2014 06:14 AM    Assessment and Plan  Other specified diabetes mellitus without complications - Plan:  Results for orders placed or performed in visit on 10/04/14  Glucose (CBG)  Result Value Ref Range   POC Glucose 104.0 (A)  70 - 99 mg/dl  HgB A1c  Result Value Ref Range   Hemoglobin A1C 6.20    Diabetes is well controlled .   SOB (  shortness of breath)/.Hypoxia  Cirrhosis of liver with ascites, unspecified hepatic cirrhosis type   Patient is currently short of breath and desaturating and 80s, have advised patient to go to the emergency room for further evaluation, if up after the discharge.   This note has been created with Surveyor, quantity. Any transcriptional errors are unintentional.    Lorayne Marek, MD

## 2014-10-04 NOTE — ED Notes (Signed)
CBG results given to Carlyn Reichert RN

## 2014-10-04 NOTE — Consult Note (Signed)
PULMONARY / CRITICAL CARE MEDICINE   Name: Garrett Turner MRN: [id number] DOB: Oct 03, 1950    ADMISSION DATE:  10/04/2014 CONSULTATION DATE:  3/17  REFERRING MD :  Candiss Norse   CHIEF COMPLAINT:  Hypotension, sepsis   INITIAL PRESENTATION:  64 yo male with hx NASH cirrhosis, DM, depression, known RUL lung nodule, recent admission for PNA who was referred from outpt MD with hypoxia, SOB.  Initially admitted by [facility] but remained mildly hypotensive despite fluids and lactate >4 so PCCM consulted for ?ICU admit.     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  Initially admitted by [facility] but remained mildly hypotensive despite fluids and lactate >4 so PCCM consulted for ?ICU admit.   C/o mild SOB and general malaise but mild.  Was going for a routine f/u when MD sent him to ER.  Pt denies chest pain, purulent sputum, cough, hemoptysis, chest pain, leg/calf pain.     PAST MEDICAL HISTORY :   has a past medical history of Diabetes mellitus; Hypertension; High cholesterol; Anxiety disorder; Pancytopenia; Inguinal hernia; Cirrhosis; UTI (lower urinary tract infection); Depression; Anxiety; and Leg cramps.  has past surgical history that includes Tonsillectomy; Esophagogastroduodenoscopy (11/04/2011); Colonoscopy (11/04/2011); and Vasectomy (1985). Prior to Admission medications   Medication Sig Start Date End Date Taking? Authorizing Provider  aspirin EC 81 MG EC tablet Take 1 tablet (81 mg total) by mouth daily. 08/24/14  Yes Kinnie Feil, MD  atorvastatin (LIPITOR) 20 MG tablet Take 1 tablet (20 mg total) by mouth daily. 06/19/14  Yes Lorayne Marek, MD  diphenhydrAMINE-zinc acetate (BENADRYL) cream Apply topically 3 (three) times daily as needed  for itching. 08/24/14  Yes Kinnie Feil, MD  escitalopram (LEXAPRO) 20 MG tablet Take 1 tablet (20 mg total) by mouth daily. 08/24/14  Yes Kinnie Feil, MD  furosemide (LASIX) 40 MG tablet Take 1 tablet (40 mg total) by mouth daily. 08/24/14  Yes Kinnie Feil, MD  hydrocortisone (ANUSOL-HC) 2.5 % rectal cream Apply topically 2 (two) times daily. 08/24/14  Yes Kinnie Feil, MD  metFORMIN (GLUCOPHAGE) 1000 MG tablet Take 1,000 mg by mouth 2 (two) times daily with a meal.   Yes Historical Provider, MD  mirtazapine (REMERON SOL-TAB) 15 MG disintegrating tablet Take 1 tablet (15 mg total) by mouth at bedtime. 08/24/14  Yes Kinnie Feil, MD  propranolol (INDERAL) 10 MG tablet Take 1 tablet (10 mg total) by mouth 2 (two) times daily. 06/19/14  Yes Lorayne Marek, MD  spironolactone (ALDACTONE) 100 MG tablet Take 1 tablet (100 mg total) by mouth daily. 08/24/14  Yes Kinnie Feil, MD  traMADol (ULTRAM) 50 MG tablet Take 1 tablet (50 mg total) by mouth every 6 (six) hours as needed for moderate pain. 08/24/14  Yes Kinnie Feil, MD   No Known Allergies  FAMILY HISTORY:  indicated that his mother is deceased. He indicated that his father is deceased. He indicated that both of his sisters are alive. He indicated that only one of his two brothers is alive.  SOCIAL HISTORY:  reports that he has never smoked. He has quit using smokeless tobacco. He reports that he drinks about 1.1 oz of alcohol per week. He reports that he does not use  illicit drugs.  REVIEW OF SYSTEMS:  As per HPI - All other systems reviewed and were neg.    SUBJECTIVE:   VITAL SIGNS: Temp:  [97.6 F (36.4 C)-97.9 F (36.6 C)] 97.9 F (36.6 C) (03/17 1206) Pulse Rate:  [78-99] 78 (03/17 1630) Resp:  [15-30] 19 (03/17 1630) BP: (99-145)/(51-82) 102/57 mmHg (03/17 1630) SpO2:  [85 %-100 %] 100 % (03/17 1630) Weight:  [161 lb 12.8 oz (73.392 kg)-165 lb (74.844 kg)] 165 lb (74.844 kg) (03/17 1041) HEMODYNAMICS:    VENTILATOR SETTINGS:   INTAKE / OUTPUT: No intake or output data in the 24 hours ending 10/04/14 1658  PHYSICAL EXAMINATION: General:  Pleasant, thin male, NAD  Neuro:  Awake, alert, appropriate, MAE HEENT:  Mm moist, no JVD Cardiovascular:  s1s2 rrr Lungs:  resps even non labored on 2L [place], few scattered rhonchi, no audible wheeze  Abdomen:  Soft, non tender, +bs, no hsm Musculoskeletal:  Warm and dry, no edema   LABS:  CBC  Recent Labs Lab 10/04/14 1125 10/04/14 1430  WBC 9.7 9.9  HGB 11.0* 9.8*  HCT 32.0* 28.2*  PLT 71* 43*   Coag's  Recent Labs Lab 10/04/14 1125 10/04/14 1430  APTT 38*  --   INR 1.58* 1.66*   BMET  Recent Labs Lab 10/04/14 1125 10/04/14 1430  NA 136 133*  K 4.6 4.3  CL 105 105  CO2 14* 15*  BUN 20 21  CREATININE 1.28 1.18  GLUCOSE 139* 153*   Electrolytes  Recent Labs Lab 10/04/14 1125 10/04/14 1430  CALCIUM 9.0 8.2*   Sepsis Markers  Recent Labs Lab 10/04/14 1426 10/04/14 1430  LATICACIDVEN 4.46* 4.2*   ABG No results for input(s): PHART, PCO2ART, PO2ART in the last 168 hours. Liver Enzymes  Recent Labs Lab 10/04/14 1125 10/04/14 1430  AST 66* 54*  ALT 36 33  ALKPHOS 118* 101  BILITOT 3.7* 4.1*  ALBUMIN 2.7* 2.4*   Cardiac Enzymes  Recent Labs Lab 10/04/14 1125 10/04/14 1430  TROPONINI 0.04* <0.03   Glucose  Recent Labs Lab 10/04/14 1202  GLUCAP 114*    Imaging No results found.   ASSESSMENT / PLAN:  PULMONARY Acute hypoxic respiratory failure - r/t HCAP +/- viral illness RUL nodule  P:   Send full RVP  Supplemental O2 as needed  IV abx as below  Empiric tamiflu  PRN BD  Aggressive pulm hygiene  F/u CT scan for lung nodule - was awaiting f/u outpt scan from last PNA  Pt DNI per Dr. Ledell Noss note   CARDIOVASCULAR Sepsis  Hypotension -- per pt baseline SBP ~100 Hx HTN P:  Cont volume - ensure 30cc/kg  F/u lactate, pct  Hold home rx   RENAL Hyponatremia - mild  P:   F/u  chem   GASTROINTESTINAL NASH - stable  P:   F/u LFT's  Monitor INR intermittently  Diet as tol  F/u ammonia  Hold home diuretics  F/u Dr. Paulita Fujita   HEMATOLOGIC Thrombocytopenia - chronic. Mild  P:  SQ heparin  F/u cbc   INFECTIOUS HCAP  R/o flu - PCR neg  P:   BCx2 3/17>>> UC 3/17>>> Sputum 3/17>>>  Cefepime 3/17>>> Vanc 3/17>>> Tamiflu 3/17>>>  ENDOCRINE DM  P:   SSI  NEUROLOGIC No active issue  P:   Monitor    FAMILY  - Updates:  Pt, family updated at bedside     F/u lactate to ensure clearing.  BP likely near baseline.  Cont gentle volume.  If lactate clearing, ok for SDU.  Will d/w [facility].   Nickolas Madrid, NP 10/04/2014  4:58 PM Pager: [phone] or [phone]

## 2014-10-04 NOTE — ED Provider Notes (Signed)
CSN: [id number]     Arrival date & time 10/04/14  1030 History   First MD Initiated Contact with Patient 10/04/14 1052     Chief Complaint  Patient presents with  . sent by MD     cirrhosis, complications of fluid build up in abdomen, low oxygen.      (Consider location/radiation/quality/duration/timing/severity/associated sxs/prior Treatment) HPI Comments: Pt with sob x 52month that's worse today--no fever or cough, no anginal type chest pain--pt notes doe and orthopnea- no leg pain or edema, no recent blood loss, has h/o chirrohsis but no current abd distetntion, has been eval for home o2 in past-saw his pcp today and sent her for evaluation due to low o2 sats  The history is provided by the patient and a relative.    Past Medical History  Diagnosis Date  . Diabetes mellitus   . Hypertension   . High cholesterol   . Anxiety disorder   . Pancytopenia   . Inguinal hernia     Left  . Cirrhosis   . UTI (lower urinary tract infection)   . Depression   . Anxiety   . Leg cramps    Past Surgical History  Procedure Laterality Date  . Tonsillectomy    . Esophagogastroduodenoscopy  11/04/2011    Procedure: ESOPHAGOGASTRODUODENOSCOPY (EGD);  Surgeon: Arta Silence, MD;  Location: [facility];  Service: Endoscopy;  Laterality: N/A;  . Colonoscopy  11/04/2011    Procedure: COLONOSCOPY;  Surgeon: Arta Silence, MD;  Location: [facility];  Service: Endoscopy;  Laterality: N/A;  . Vasectomy  1985   Family History  Problem Relation Age of Onset  . Coronary artery disease    . Hypertension    . Heart failure Father   . Hypertension Father   . Heart disease Father   . Heart failure Mother   . Hypertension Mother   . Heart disease Mother   . Stroke Paternal Grandmother   . Obesity Daughter    History  Substance Use Topics  . Smoking status: Never Smoker   . Smokeless tobacco: Former Systems developer  . Alcohol Use: 1.1 oz/week    1 Glasses of wine, 1 Standard drinks or equivalent per  week    Review of Systems  All other systems reviewed and are negative.     Allergies  Review of patient's allergies indicates no known allergies.  Home Medications   Prior to Admission medications   Medication Sig Start Date End Date Taking? Authorizing Provider  aspirin EC 81 MG EC tablet Take 1 tablet (81 mg total) by mouth daily. 08/24/14   Kinnie Feil, MD  atorvastatin (LIPITOR) 20 MG tablet Take 1 tablet (20 mg total) by mouth daily. 06/19/14   Lorayne Marek, MD  diphenhydrAMINE-zinc acetate (BENADRYL) cream Apply topically 3 (three) times daily as needed for itching. 08/24/14   Kinnie Feil, MD  escitalopram (LEXAPRO) 20 MG tablet Take 1 tablet (20 mg total) by mouth daily. 08/24/14   Kinnie Feil, MD  furosemide (LASIX) 40 MG tablet Take 1 tablet (40 mg total) by mouth daily. 08/24/14   Kinnie Feil, MD  hydrocortisone (ANUSOL-HC) 2.5 % rectal cream Apply topically 2 (two) times daily. 08/24/14   Kinnie Feil, MD  losartan-hydrochlorothiazide (HYZAAR) 50-12.5 MG per tablet Take 1 tablet by mouth daily.    Historical Provider, MD  metFORMIN (GLUCOPHAGE) 1000 MG tablet Take 1,000 mg by mouth 2 (two) times daily with a meal.    Historical Provider, MD  mirtazapine (  REMERON SOL-TAB) 15 MG disintegrating tablet Take 1 tablet (15 mg total) by mouth at bedtime. 08/24/14   Kinnie Feil, MD  propranolol (INDERAL) 10 MG tablet Take 1 tablet (10 mg total) by mouth 2 (two) times daily. Patient not taking: Reported on 09/12/2014 06/19/14   Lorayne Marek, MD  spironolactone (ALDACTONE) 100 MG tablet Take 1 tablet (100 mg total) by mouth daily. 08/24/14   Kinnie Feil, MD  traMADol (ULTRAM) 50 MG tablet Take 1 tablet (50 mg total) by mouth every 6 (six) hours as needed for moderate pain. 08/24/14   Kinnie Feil, MD  traZODone (DESYREL) 50 MG tablet Take 50 mg by mouth at bedtime.    Historical Provider, MD   BP 145/82 mmHg  Pulse 82  Temp(Src) 97.8 F (36.6 C) (Oral)  Resp  18  Ht 6\' 1"  (1.854 m)  Wt 165 lb (74.844 kg)  BMI 21.77 kg/m2  SpO2 86% Physical Exam  Constitutional: He is oriented to person, place, and time. He appears well-developed and well-nourished.  Non-toxic appearance. No distress.  HENT:  Head: Normocephalic and atraumatic.  Eyes: Conjunctivae, EOM and lids are normal. Pupils are equal, round, and reactive to light.  Neck: Normal range of motion. Neck supple. No tracheal deviation present. No thyroid mass present.  Cardiovascular: Normal rate, regular rhythm and normal heart sounds.  Exam reveals no gallop.   No murmur heard. Pulmonary/Chest: Effort normal and breath sounds normal. No stridor. No respiratory distress. He has no decreased breath sounds. He has no wheezes. He has no rhonchi. He has no rales.  Abdominal: Soft. Normal appearance and bowel sounds are normal. He exhibits no distension. There is no tenderness. There is no rebound and no CVA tenderness.  Musculoskeletal: Normal range of motion. He exhibits no edema or tenderness.  Neurological: He is alert and oriented to person, place, and time. He has normal strength. No cranial nerve deficit or sensory deficit. GCS eye subscore is 4. GCS verbal subscore is 5. GCS motor subscore is 6.  Skin: Skin is warm and dry. No abrasion and no rash noted.  Psychiatric: His speech is normal and behavior is normal. His mood appears anxious.  Nursing note and vitals reviewed.   ED Course  Procedures (including critical care time) Labs Review Labs Reviewed  BRAIN NATRIURETIC PEPTIDE  PROTIME-INR  APTT  CBC WITH DIFFERENTIAL/PLATELET  COMPREHENSIVE METABOLIC PANEL  TROPONIN I    Imaging Review No results found.   EKG Interpretation   Date/Time:  Thursday October 04 2014 11:22:31 EDT Ventricular Rate:  73 PR Interval:  149 QRS Duration: 105 QT Interval:  456 QTC Calculation: 502 R Axis:   43 Text Interpretation:  Sinus rhythm Borderline T wave abnormalities  Prolonged QT interval  Artifact in lead(s) I II aVR V1 No significant  change since last tracing Confirmed by Leeloo Silverthorne  MD, Javione Gunawan ([id number]) on  10/04/2014 1:22:49 PM      MDM   Final diagnoses:  SOB (shortness of breath)     Patient started on antibiotics for suspected Hcap. Blood cultures obtained prior to this. Will be admitted to stepdown after discussion with the hospitalist   Lacretia Leigh, MD 10/04/14 1323

## 2014-10-04 NOTE — ED Notes (Signed)
Spoke to Dr Candiss Norse about pts lactic acid of 4.2. And BP trending down. He said to give another liter bolus of Normal Saline.

## 2014-10-04 NOTE — Progress Notes (Signed)
Patient being sent to the ED for evaluation Spoke with [place] in the er Patient went to ed with oxygen tank from our office

## 2014-10-04 NOTE — ED Notes (Signed)
i-stat CG4 result shown to Dr. Thurnell Garbe

## 2014-10-04 NOTE — H&P (Addendum)
Patient Demographics  Garrett Turner, is a 64 y.o. male  MRN: [id number]   DOB - [birth year]  Admit Date - 10/04/2014  Outpatient Primary MD for the patient is Lorayne Marek, MD   With History of -  Past Medical History  Diagnosis Date  . Diabetes mellitus   . Hypertension   . High cholesterol   . Anxiety disorder   . Pancytopenia   . Inguinal hernia     Left  . Cirrhosis   . UTI (lower urinary tract infection)   . Depression   . Anxiety   . Leg cramps       Past Surgical History  Procedure Laterality Date  . Tonsillectomy    . Esophagogastroduodenoscopy  11/04/2011    Procedure: ESOPHAGOGASTRODUODENOSCOPY (EGD);  Surgeon: Arta Silence, MD;  Location: [facility];  Service: Endoscopy;  Laterality: N/A;  . Colonoscopy  11/04/2011    Procedure: COLONOSCOPY;  Surgeon: Arta Silence, MD;  Location: [facility];  Service: Endoscopy;  Laterality: N/A;  . Vasectomy  1985    in for   Chief Complaint  Patient presents with  . sent by MD     cirrhosis, complications of fluid build up in abdomen, low oxygen.      HPI  Garrett Turner  is a 64 y.o. male,  with history of cirrhosis due to [place], recent admission for pneumonia, ascites which is in good control on diuretics, depression, DM type II, right upper lobe nodule needs a repeat CT scan in the outpatient setting and pulmonary follow-up, history of gallstones, who comes in referred from the local free clinic for evaluation of shortness of breath and hypoxia.  In the ER workup consistent with HCAP, sepsis with metabolic acidosis and I was called to admit the patient. Patient himself agrees to experiencing chills and subjective fevers this morning with severe right gutters, does have shortness of breath which is ongoing for the last few days  noticed by his home health nurse, he does not have cough, no headache, no chest pain or palpitations, no abdominal pain, no blood in stool or urine or dysuria. No focal joint pains or aches. No body aches. No focal weakness.    Review of Systems    In addition to the HPI above,   +ve Fever-chills, No Headache, No changes with Vision or hearing, No problems swallowing food or Liquids, No Chest pain, Cough , ++ Shortness of Breath, No Abdominal pain, No Nausea or Vommitting, Bowel movements are regular, No Blood in stool or Urine, No dysuria, No new skin rashes or bruises, No new joints pains-aches,  No new weakness, tingling, numbness in any extremity, No recent weight gain or loss, No polyuria, polydypsia or polyphagia, No significant Mental Stressors.  A full 10 point Review of Systems was done, except as stated above, all other Review of Systems were negative.   Social History History  Substance Use Topics  . Smoking  status: Never Smoker   . Smokeless tobacco: Former Systems developer  . Alcohol Use: 1.1 oz/week    1 Glasses of wine, 1 Standard drinks or equivalent per week      Family History Family History  Problem Relation Age of Onset  . Coronary artery disease    . Hypertension    . Heart failure Father   . Hypertension Father   . Heart disease Father   . Heart failure Mother   . Hypertension Mother   . Heart disease Mother   . Stroke Paternal Grandmother   . Obesity Daughter       Prior to Admission medications   Medication Sig Start Date End Date Taking? Authorizing Provider  aspirin EC 81 MG EC tablet Take 1 tablet (81 mg total) by mouth daily. 08/24/14  Yes Kinnie Feil, MD  atorvastatin (LIPITOR) 20 MG tablet Take 1 tablet (20 mg total) by mouth daily. 06/19/14  Yes Lorayne Marek, MD  diphenhydrAMINE-zinc acetate (BENADRYL) cream Apply topically 3 (three) times daily as needed for itching. 08/24/14  Yes Kinnie Feil, MD  escitalopram (LEXAPRO) 20 MG tablet  Take 1 tablet (20 mg total) by mouth daily. 08/24/14  Yes Kinnie Feil, MD  furosemide (LASIX) 40 MG tablet Take 1 tablet (40 mg total) by mouth daily. 08/24/14   Kinnie Feil, MD  hydrocortisone (ANUSOL-HC) 2.5 % rectal cream Apply topically 2 (two) times daily. 08/24/14   Kinnie Feil, MD  losartan-hydrochlorothiazide (HYZAAR) 50-12.5 MG per tablet Take 1 tablet by mouth daily.    Historical Provider, MD  metFORMIN (GLUCOPHAGE) 1000 MG tablet Take 1,000 mg by mouth 2 (two) times daily with a meal.    Historical Provider, MD  mirtazapine (REMERON SOL-TAB) 15 MG disintegrating tablet Take 1 tablet (15 mg total) by mouth at bedtime. 08/24/14   Kinnie Feil, MD  propranolol (INDERAL) 10 MG tablet Take 1 tablet (10 mg total) by mouth 2 (two) times daily. Patient not taking: Reported on 09/12/2014 06/19/14   Lorayne Marek, MD  spironolactone (ALDACTONE) 100 MG tablet Take 1 tablet (100 mg total) by mouth daily. 08/24/14   Kinnie Feil, MD  traMADol (ULTRAM) 50 MG tablet Take 1 tablet (50 mg total) by mouth every 6 (six) hours as needed for moderate pain. 08/24/14   Kinnie Feil, MD  traZODone (DESYREL) 50 MG tablet Take 50 mg by mouth at bedtime.    Historical Provider, MD    No Known Allergies  Physical Exam  Vitals  Blood pressure 124/70, pulse 99, temperature 97.9 F (36.6 C), temperature source Oral, resp. rate 25, height 6\' 1"  (1.854 m), weight 74.844 kg (165 lb), SpO2 99 %.   1. General elderly frail white male lying in bed in NAD,     2. Normal affect and insight, Not Suicidal or Homicidal, Awake Alert, Oriented X 3.  3. No F.N deficits, ALL C.Nerves Intact, Strength 5/5 all 4 extremities, Sensation intact all 4 extremities, Plantars down going.  4. Ears and Eyes appear Normal, Conjunctivae clear, PERRLA. Moist Oral Mucosa.  5. Supple Neck, No JVD, No cervical lymphadenopathy appriciated, No Carotid Bruits.  6. Symmetrical Chest wall movement, Good air movement bilateral  few bilateral crackles. RRR, No Gallops, Rubs or Murmurs, No Parasternal Heave.  8. Positive Bowel Sounds, Abdomen Soft, No tenderness, No organomegaly appriciated,No rebound -guarding or rigidity.  9.  No Cyanosis, Normal Skin Turgor, No Skin Rash or Bruise.  10. Good muscle tone,  joints  appear normal , no effusions, Normal ROM.  11. No Palpable Lymph Nodes in Neck or Axillae     Data Review  CBC  Recent Labs Lab 10/04/14 1125  WBC 9.7  HGB 11.0*  HCT 32.0*  PLT 71*  MCV 92.5  MCH 31.8  MCHC 34.4  RDW 17.1*  LYMPHSABS 0.7  MONOABS 0.6  EOSABS 0.1  BASOSABS 0.0   ------------------------------------------------------------------------------------------------------------------  Chemistries   Recent Labs Lab 10/04/14 1125  NA 136  K 4.6  CL 105  CO2 14*  GLUCOSE 139*  BUN 20  CREATININE 1.28  CALCIUM 9.0  AST 66*  ALT 36  ALKPHOS 118*  BILITOT 3.7*   ------------------------------------------------------------------------------------------------------------------ estimated creatinine clearance is 60.9 mL/min (by C-G formula based on Cr of 1.28). ------------------------------------------------------------------------------------------------------------------ No results for input(s): TSH, T4TOTAL, T3FREE, THYROIDAB in the last 72 hours.  Invalid input(s): FREET3   Coagulation profile  Recent Labs Lab 10/04/14 1125  INR 1.58*   -------------------------------------------------------------------------------------------------------------------  Recent Labs  10/04/14 1125  DDIMER >20.00*   -------------------------------------------------------------------------------------------------------------------  Cardiac Enzymes  Recent Labs Lab 10/04/14 1125  TROPONINI 0.04*   ------------------------------------------------------------------------------------------------------------------ Invalid input(s):  POCBNP   ---------------------------------------------------------------------------------------------------------------  Urinalysis    Component Value Date/Time   COLORURINE YELLOW 08/16/2014 1750   APPEARANCEUR CLEAR 08/16/2014 1750   LABSPEC 1.011 08/16/2014 1750   PHURINE 6.5 08/16/2014 1750   GLUCOSEU NEGATIVE 08/16/2014 1750   HGBUR NEGATIVE 08/16/2014 1750   BILIRUBINUR NEGATIVE 08/16/2014 1750   KETONESUR NEGATIVE 08/16/2014 1750   PROTEINUR NEGATIVE 08/16/2014 1750   UROBILINOGEN 2.0* 08/16/2014 1750   NITRITE NEGATIVE 08/16/2014 1750   LEUKOCYTESUR NEGATIVE 08/16/2014 1750    ----------------------------------------------------------------------------------------------------------------  Imaging results:   Dg Chest 2 View  10/04/2014   CLINICAL DATA:  Shortness of breath, weakness, hypertension, diabetes, cirrhosis  EXAM: CHEST  2 VIEW  COMPARISON:  08/14/2014  FINDINGS: Normal heart size, mediastinal contours and pulmonary vascularity.  Atherosclerotic calcification aorta.  Decreased lung volumes with interstitial infiltrates in the mid to lower lungs bilaterally, peripheral predominance, question infection or edema.  No pleural effusion or pneumothorax.  Bones unremarkable.  IMPRESSION: Interstitial infiltrates bilaterally, peripheral predominance, question infection or edema.   Electronically Signed   By: Lavonia Dana M.D.   On: 10/04/2014 12:08    My personal review of EKG: Rhythm NSR,  no Acute ST changes    Assessment & Plan   1.  Sepsis with acute hypoxic respiratory failure secondary to HCAP. Will be admitted to stepdown unit, pan culture, IV antibiotic per HCAP protocol dosed by pharmacy, IV fluid bolus and maintenance, supportive care with nebulizer treatments and oxygen,  rule out influenza place on Tamiflu till we get results,since his d-dimer is extremely elevated will check CT angiogram of the chest and bilateral lower extremity venous duplex. Clinical  suspicion for PE is low.    2. Garrett Turner. Stable. Currently hypotensive requiring IV fluid bolus, will hold diuretics. Continue low-dose Inderal if tolerated by blood pressure after bolus. Follows with Dr. Paulita Fujita.   3.DM type II. Hold Glucophage, check A1c. Sliding scale.    4. Chr. Thrombocytopenia. Chronic due to Garrett Turner. No acute issues. I am placing him on prophylactic dose heparin, monitor platelet counts closely.   5.Depression. Continue Lexapro and Desyrel.    6. Essential hypertension. Blood pressure soft. Hold blood pressure medications   7. High ++ d-dimer -  likely due to 1 above. Check CT angiogram, check for extended a venous duplex, hydrate, will check DIC panel one time. Clinically  low clinical suspicion for DIC-blood clot burden.     Addendum. At 4:20 PM. Despite 3 L of IV fluid patient is still hypotensive with elevated lactate at 4.2, have discussed with critical care they will consult. He will go to stepdown. If he continues to decline they will take over.     DVT Prophylaxis Heparin    AM Labs Ordered, also please review Full Orders  Family Communication: Admission, patients condition and plan of care including tests being ordered have been discussed with the patient and friend bedside who indicate understanding and agree with the plan and Code Status.  Code Status no intubation  Likely DC to  home  Condition GUARDED    Time spent in minutes : 35    Gustaf Mccarter K M.D on 10/04/2014 at 1:40 PM  Between 7am to 7pm - Pager - [phone]  After 7pm go to [email or web address] - password [email or web address]  [facility]  Office  [phone]

## 2014-10-05 ENCOUNTER — Inpatient Hospital Stay: Payer: Self-pay | Admitting: Internal Medicine

## 2014-10-05 LAB — BASIC METABOLIC PANEL
ANION GAP: 5 (ref 5–15)
BUN: 19 mg/dL (ref 6–23)
CALCIUM: 7.7 mg/dL — AB (ref 8.4–10.5)
CHLORIDE: 109 mmol/L (ref 96–112)
CO2: 20 mmol/L (ref 19–32)
CREATININE: 1.05 mg/dL (ref 0.50–1.35)
GFR calc non Af Amer: 73 mL/min — ABNORMAL LOW (ref >90)
GFR, EST AFRICAN AMERICAN: 84 mL/min — AB (ref >90)
Glucose, Bld: 170 mg/dL — ABNORMAL HIGH (ref 70–99)
Potassium: 3.8 mmol/L (ref 3.5–5.1)
Sodium: 134 mmol/L — ABNORMAL LOW (ref 135–145)

## 2014-10-05 LAB — GLUCOSE, CAPILLARY
GLUCOSE-CAPILLARY: 115 mg/dL — AB (ref 70–99)
GLUCOSE-CAPILLARY: 125 mg/dL — AB (ref 70–99)
Glucose-Capillary: 201 mg/dL — ABNORMAL HIGH (ref 70–99)
Glucose-Capillary: 126 mg/dL — ABNORMAL HIGH (ref 70–99)

## 2014-10-05 LAB — URINALYSIS, ROUTINE W REFLEX MICROSCOPIC
GLUCOSE, UA: NEGATIVE mg/dL
HGB URINE DIPSTICK: NEGATIVE
Ketones, ur: 15 mg/dL — AB
Leukocytes, UA: NEGATIVE
Nitrite: NEGATIVE
Protein, ur: NEGATIVE mg/dL
SPECIFIC GRAVITY, URINE: 1.009 (ref 1.005–1.030)
Urobilinogen, UA: 1 mg/dL (ref 0.0–1.0)
pH: 6 (ref 5.0–8.0)

## 2014-10-05 LAB — CBC
HEMATOCRIT: 24 % — AB (ref 39.0–52.0)
HEMOGLOBIN: 8.4 g/dL — AB (ref 13.0–17.0)
MCH: 31.7 pg (ref 26.0–34.0)
MCHC: 35 g/dL (ref 30.0–36.0)
MCV: 90.6 fL (ref 78.0–100.0)
Platelets: 45 10*3/uL — ABNORMAL LOW (ref 150–400)
RBC: 2.65 MIL/uL — AB (ref 4.22–5.81)
RDW: 17.2 % — ABNORMAL HIGH (ref 11.5–15.5)
WBC: 6.1 10*3/uL (ref 4.0–10.5)

## 2014-10-05 LAB — CORTISOL: Cortisol, Plasma: 36.6 ug/dL

## 2014-10-05 LAB — HEMOGLOBIN A1C
Hgb A1c MFr Bld: 6.6 % — ABNORMAL HIGH (ref 4.8–5.6)
Mean Plasma Glucose: 143 mg/dL

## 2014-10-05 LAB — PROCALCITONIN: Procalcitonin: 1.62 ng/mL

## 2014-10-05 LAB — TROPONIN I: Troponin I: <0.03 ng/mL (ref <0.031)

## 2014-10-05 LAB — HAPTOGLOBIN: Haptoglobin: 45 mg/dL (ref 34–200)

## 2014-10-05 MED ORDER — ESCITALOPRAM OXALATE 10 MG PO TABS
10.0000 mg | ORAL_TABLET | Freq: Every day | ORAL | Status: DC
  Administered 2014-10-06 – 2014-10-09 (×4): 10 mg via ORAL
  Filled 2014-10-05 (×4): qty 1

## 2014-10-05 MED ORDER — ENSURE COMPLETE PO LIQD
237.0000 mL | Freq: Two times a day (BID) | ORAL | Status: DC
  Administered 2014-10-05 – 2014-10-09 (×7): 237 mL via ORAL

## 2014-10-05 MED ORDER — OXYCODONE HCL 5 MG PO TABS
5.0000 mg | ORAL_TABLET | Freq: Four times a day (QID) | ORAL | Status: DC | PRN
  Administered 2014-10-06 – 2014-10-07 (×2): 5 mg via ORAL
  Filled 2014-10-05 (×2): qty 1

## 2014-10-05 MED ORDER — MIRTAZAPINE 15 MG PO TBDP
15.0000 mg | ORAL_TABLET | Freq: Every day | ORAL | Status: DC
  Administered 2014-10-05 – 2014-10-08 (×4): 15 mg via ORAL
  Filled 2014-10-05 (×6): qty 1

## 2014-10-05 NOTE — Progress Notes (Signed)
[place] TEAM 1 - Stepdown/ICU TEAM Progress Note  Garrett Turner UDJ:[id number] DOB: 1951-01-05 DOA: 10/04/2014 PCP: Lorayne Marek, MD  Admit HPI / Brief Narrative: 64 y.o. Male with history of cirrhosis due to [facility], recent admission for pneumonia, ascites which is in good control on diuretics, depression, DM type II, right upper lobe nodule needs a repeat CT scan in the outpatient setting and pulmonary follow-up, and a history of gallstones, who came to the ED in referral from the local free clinic for evaluation of shortness of breath and hypoxia.  In the ER workup was consistent with HCAP + sepsis with metabolic acidosis. Patient noted chills and subjective fevers with severe rigors, as well as shortness of breath for a few days.  HPI/Subjective: Pt is alert and oriented.  He is very hungry.  He presently denies cp, sob, abdom pain, n/v.    Assessment/Plan:  Septic shock secondary to HCAP Baseline SBP reportedly ~100 - BP now stable at baseline - hold BP meds for now and follow - stop IVF   Acute hypoxic respiratory failure due to HCAP CTa chest notes subpleural reticulation B suggestive of NSIP - outpt high resolution CT chest suggested - pt has been afebrile here, w/ normal WBC, but reported rigors at home - procalcitonin is signif elevated - cont emperic tx for HCAP w/ planned 7 day abx course - flu panel negative so will stop tamiflu   1/2 Blood cx + gram+ cocci in pairs/chains Possible contaminant - cont empiric vanc until speciation/sensitivity available   Chronic L pleural effusion Not clinically significant at this time - follow  NASH - cirrhosis of the liver  Resume maintenance medications - avoid diuretics for now   Hyponatremia Likely due to cirrhosis - stable - follow   DM2 Reasonably controlled at this time - follow trend   Chronic Thrombocytopenia Due to cirrhosis - stable - will follow  RUL lung nodule  13 mm RUL nodule seen on CTA January 2016 - not  noted on CTa chest this admit - for high resolution CT chest as outpt as noted above   Depression  Elevated d-Dimer No PE on CTA chest   Code Status: DO NOT INTUBATE  Family Communication: no family present at time of exam Disposition Plan: SDU   Consultants: PCCM  Procedures: none  Antibiotics: Zosyn 3/17 Cefepime 3/17> Vanc 3/17>  Tamiflu 3/17>3/18  DVT prophylaxis: SCDs  Objective: Blood pressure 88/50, pulse 68, temperature 97.7 F (36.5 C), temperature source Oral, resp. rate 23, height 6\' 1"  (1.854 m), weight 78.2 kg (172 lb 6.4 oz), SpO2 96 %.  Intake/Output Summary (Last 24 hours) at 10/05/14 1023 Last data filed at 10/05/14 1023  Gross per 24 hour  Intake   1913 ml  Output    400 ml  Net   1513 ml   Exam: General: No acute respiratory distress Lungs: fine diffuse crackles - no wheeze  Cardiovascular: Regular rate and rhythm without murmur gallop or rub normal S1 and S2 Abdomen: Nontender, nondistended, soft, bowel sounds positive, no rebound, no ascites, no appreciable mass Extremities: No significant cyanosis, clubbing, or edema bilateral lower extremities  Data Reviewed: Basic Metabolic Panel:  Recent Labs Lab 10/04/14 1125 10/04/14 1430 10/05/14 0040  NA 136 133* 134*  K 4.6 4.3 3.8  CL 105 105 109  CO2 14* 15* 20  GLUCOSE 139* 153* 170*  BUN 20 21 19   CREATININE 1.28 1.18 1.05  CALCIUM 9.0 8.2* 7.7*    Liver Function  Tests:  Recent Labs Lab 10/04/14 1125 10/04/14 1430  AST 66* 54*  ALT 36 33  ALKPHOS 118* 101  BILITOT 3.7* 4.1*  PROT 7.9 7.2  ALBUMIN 2.7* 2.4*    Recent Labs Lab 10/04/14 1430  AMMONIA 48*   Coags:  Recent Labs Lab 10/04/14 1125 10/04/14 1430  INR 1.58* 1.66*    Recent Labs Lab 10/04/14 1125  APTT 38*   CBC:  Recent Labs Lab 10/04/14 1125 10/04/14 1430 10/04/14 2007 10/05/14 0040  WBC 9.7 9.9 7.2 6.1  NEUTROABS 8.3*  --   --   --   HGB 11.0* 9.8* 8.6* 8.4*  HCT 32.0* 28.2* 24.6*  24.0*  MCV 92.5 91.0 91.1 90.6  PLT 71* 43* 51* 45*    Cardiac Enzymes:  Recent Labs Lab 10/04/14 1125 10/04/14 1430 10/04/14 2007 10/05/14 0040  TROPONINI 0.04* <0.03 <0.03 <0.03   CBG:  Recent Labs Lab 10/04/14 1202 10/04/14 1759 10/04/14 2220 10/05/14 0811  GLUCAP 114* 144* 204* 115*    Recent Results (from the past 240 hour(s))  Culture, blood (routine x 2)     Status: None (Preliminary result)   Collection Time: 10/04/14  2:01 PM  Result Value Ref Range Status   Specimen Description BLOOD ARM RIGHT  Final   Special Requests BOTTLES DRAWN AEROBIC AND ANAEROBIC 10CC  Final   Culture   Final    GRAM POSITIVE COCCI IN PAIRS AND CHAINS Note: Gram Stain Report Called to,Read Back By and Verified With: TATA CARBONE @ 8:44 AM 10/05/14 BY DWEEKS Performed at [facility]    Report Status PENDING  Incomplete  MRSA PCR Screening     Status: None   Collection Time: 10/04/14  8:17 PM  Result Value Ref Range Status   MRSA by PCR NEGATIVE NEGATIVE Final    Comment:        The GeneXpert MRSA Assay (FDA approved for NASAL specimens only), is one component of a comprehensive MRSA colonization surveillance program. It is not intended to diagnose MRSA infection nor to guide or monitor treatment for MRSA infections.      Studies:  Recent x-ray studies have been reviewed in detail by the Attending Physician  Scheduled Meds:  Scheduled Meds: . aspirin  81 mg Oral Daily  . ceFEPime (MAXIPIME) IV  1 g Intravenous 3 times per day  . escitalopram  20 mg Oral Daily  . heparin  5,000 Units Subcutaneous 3 times per day  . insulin aspart  0-5 Units Subcutaneous QHS  . insulin aspart  0-9 Units Subcutaneous TID WC  . lactulose  20 g Oral BID  . propranolol  10 mg Oral BID  . sodium chloride  3 mL Intravenous Q12H  . vancomycin  1,000 mg Intravenous Q12H    Time spent on care of this patient: 35 mins   Buryl Bamber T , MD   [facility] Office   [phone] Pager - Text Page per Shea Evans as per below:  On-Call/Text Page:      [email or web address]      password [email or web address]  If 7PM-7AM, please contact night-coverage [email or web address] Password [email or web address] 10/05/2014, 10:23 AM   LOS: 1 day

## 2014-10-05 NOTE — Progress Notes (Signed)
INITIAL NUTRITION ASSESSMENT  DOCUMENTATION CODES Per approved criteria  -Severe malnutrition in the context of chronic illness   INTERVENTION: Ensure Enlive po BID, each supplement provides 350 kcal and 20 grams of protein  Encouraged use of supplements at home  NUTRITION DIAGNOSIS: Malnutrition related to chronic illness as evidenced by severe fat and muscle depletion.   Goal: Pt to meet >/= 90% of their estimated nutrition needs   Monitor:  PO intake, supplement acceptance, weight trends  Reason for Assessment: Pt identified as at nutrition risk on the Malnutrition Screen Tool  64 y.o. male  Admitting Dx: Sepsis  ASSESSMENT: Pt with hx of NASH cirrhosis, DM who was recently admitted with PNA then referred to hospital from outpt MD due to hypoxia and SOB.  Per CCM pt in acute septic shock due to HCAP +/- viral illness.   Per pt he has had a poor appetite for awhile. He also had a lot of fluid on him but didn't realize it. Once the fluid came off he realized how much weight he had lost.  He then went to SNF for 20 days and ate well there, likely in part because he didn't have to take care of himself like he had been. He reports living alone and has trouble cooking for himself. He has a daughter who lives nearby but she has a 46, 16, and 66 year old and can only do so much for him.  He reports that he really liked being at SNF, they took very good care of me per pt.   Nutrition Focused Physical Exam:  Subcutaneous Fat:  Orbital Region: severe depletion Upper Arm Region: severe depletion Thoracic and Lumbar Region: severe depletion  Muscle:  Temple Region: severe depletion Clavicle Bone Region: severe depletion Clavicle and Acromion Bone Region: severe depletion Scapular Bone Region: severe depletion Dorsal Hand: severe depletion Patellar Region: severe depletion Anterior Thigh Region: severe depletion Posterior Calf Region: severe depletion   Edema: not  present   Height: Ht Readings from Last 1 Encounters:  10/04/14 6\' 1"  (1.854 m)    Weight: Wt Readings from Last 1 Encounters:  10/05/14 172 lb 6.4 oz (78.2 kg)    Ideal Body Weight: 83.6 kg   % Ideal Body Weight: 94%  Wt Readings from Last 10 Encounters:  10/05/14 172 lb 6.4 oz (78.2 kg)  10/04/14 161 lb 12.8 oz (73.392 kg)  09/12/14 164 lb (74.39 kg)  08/30/14 186 lb (84.369 kg)  08/28/14 186 lb (84.369 kg)  08/15/14 186 lb 11.2 oz (84.687 kg)  06/19/14 197 lb 9.6 oz (89.631 kg)  05/02/14 203 lb (92.08 kg)  02/14/14 205 lb 12.8 oz (93.35 kg)  07/25/13 207 lb 3.2 oz (93.985 kg)    Usual Body Weight: he's unsure, was 190 lb he lost weight and fluid was masking it  % Usual Body Weight: 91%  BMI:  Body mass index is 22.75 kg/(m^2).  Estimated Nutritional Needs: Kcal: 2000-2200 Protein: 100-125 grams Fluid: > 2 L/day  Skin: WDL  Diet Order: Diet regular  EDUCATION NEEDS: -No education needs identified at this time   Intake/Output Summary (Last 24 hours) at 10/05/14 1229 Last data filed at 10/05/14 1119  Gross per 24 hour  Intake   1913 ml  Output    650 ml  Net   1263 ml    Last BM: 3/16   Labs:   Recent Labs Lab 10/04/14 1125 10/04/14 1430 10/05/14 0040  NA 136 133* 134*  K 4.6 4.3  3.8  CL 105 105 109  CO2 14* 15* 20  BUN 20 21 19   CREATININE 1.28 1.18 1.05  CALCIUM 9.0 8.2* 7.7*  GLUCOSE 139* 153* 170*    CBG (last 3)   Recent Labs  10/04/14 2220 10/05/14 0811 10/05/14 1120  GLUCAP 204* 115* 125*    Scheduled Meds: . ceFEPime (MAXIPIME) IV  1 g Intravenous 3 times per day  . [START ON 10/06/2014] escitalopram  10 mg Oral Daily  . insulin aspart  0-5 Units Subcutaneous QHS  . insulin aspart  0-9 Units Subcutaneous TID WC  . lactulose  20 g Oral BID  . mirtazapine  15 mg Oral QHS  . sodium chloride  3 mL Intravenous Q12H  . vancomycin  1,000 mg Intravenous Q12H    Continuous Infusions:    Past Medical History  Diagnosis  Date  . Hypertension   . High cholesterol   . Anxiety disorder   . Pancytopenia   . UTI (lower urinary tract infection)   . Depression   . Anxiety   . Leg cramps   . Type II diabetes mellitus   . Liver cirrhosis secondary to NASH   . Infectious hepatitis 1971  . Pneumonia ~ 07/2014; 09/2014    Past Surgical History  Procedure Laterality Date  . Tonsillectomy    . Esophagogastroduodenoscopy  11/04/2011    Procedure: ESOPHAGOGASTRODUODENOSCOPY (EGD);  Surgeon: Arta Silence, MD;  Location: [facility];  Service: Endoscopy;  Laterality: N/A;  . Colonoscopy  11/04/2011    Procedure: COLONOSCOPY;  Surgeon: Arta Silence, MD;  Location: [facility];  Service: Endoscopy;  Laterality: N/A;  . Vasectomy  1985  . Fixation kyphoplasty lumbar spine      "L5"  . Cardiac catheterization  "years ago"  . Inguinal hernia repair Bilateral 06/2013    [place], [place], [place] Pager [phone] After Hours Pager

## 2014-10-05 NOTE — Progress Notes (Signed)
CRITICAL VALUE ALERT  Critical value received:  Blood culture; gm (+) cocci in pairs and chains  Date of notification:  10/05/2014   Time of notification:  0816  Critical value read back:yes  Nurse who received alert:  Donnella Bi, RN  MD notified (1st page): Dr. Thereasa Solo  Time of first page:  0906  MD notified (2nd page):  Time of second page:  Responding MD: Dr. Thereasa Solo  Time MD responded:  Seen by Dr. Thereasa Solo

## 2014-10-05 NOTE — Consult Note (Signed)
PULMONARY / CRITICAL CARE MEDICINE   Name: Garrett Turner MRN: [id number] DOB: [birth year]    ADMISSION DATE:  10/04/2014 CONSULTATION DATE:  3/17  REFERRING MD :  Candiss Norse   CHIEF COMPLAINT:  Hypotension, sepsis   INITIAL PRESENTATION:  64 yo male with hx NASH cirrhosis, DM, depression, known RUL lung nodule, recent admission for PNA who was referred from outpt MD with hypoxia, SOB.  Initially admitted by [facility] but remained mildly hypotensive despite fluids and lactate >4 so PCCM consulted for ?ICU admit.     STUDIES:  CTA chest 3/17>>> neg PE, chronic small L effusion, persistent subpleural reticulation  SIGNIFICANT EVENTS:  SUBJECTIVE: No events overnight, vitals stable and no new complaints.  VITAL SIGNS: Temp:  [97.6 F (36.4 C)-98.1 F (36.7 C)] 97.6 F (36.4 C) (03/18 1118) Pulse Rate:  [55-99] 56 (03/18 1118) Resp:  [17-25] 20 (03/18 1118) BP: (86-124)/(50-70) 86/59 mmHg (03/18 1118) SpO2:  [96 %-100 %] 98 % (03/18 1118) Weight:  [74.844 kg (165 lb)-78.2 kg (172 lb 6.4 oz)] 78.2 kg (172 lb 6.4 oz) (03/18 0500)   HEMODYNAMICS:   VENTILATOR SETTINGS:   INTAKE / OUTPUT:  Intake/Output Summary (Last 24 hours) at 10/05/14 1229 Last data filed at 10/05/14 1119  Gross per 24 hour  Intake   1913 ml  Output    650 ml  Net   1263 ml   PHYSICAL EXAMINATION: General:  Pleasant, thin male, NAD  Neuro:  Awake, alert, appropriate, MAE HEENT:  Mm moist, no JVD Cardiovascular:  s1s2 rrr Lungs:  resps even non labored on 2L [place], few scattered rhonchi, no audible wheeze  Abdomen:  Soft, non tender, +bs, no hsm Musculoskeletal:  Warm and dry, no edema   LABS:  CBC  Recent Labs Lab 10/04/14 1430 10/04/14 2007 10/05/14 0040  WBC 9.9 7.2 6.1  HGB 9.8* 8.6* 8.4*  HCT 28.2* 24.6* 24.0*  PLT 43* 51* 45*   Coag's  Recent Labs Lab 10/04/14 1125 10/04/14 1430  APTT 38*  --   INR 1.58* 1.66*   BMET  Recent Labs Lab 10/04/14 1125 10/04/14 1430 10/05/14 0040   NA 136 133* 134*  K 4.6 4.3 3.8  CL 105 105 109  CO2 14* 15* 20  BUN 20 21 19   CREATININE 1.28 1.18 1.05  GLUCOSE 139* 153* 170*   Electrolytes  Recent Labs Lab 10/04/14 1125 10/04/14 1430 10/05/14 0040  CALCIUM 9.0 8.2* 7.7*   Sepsis Markers  Recent Labs Lab 10/04/14 1430 10/04/14 1727 10/04/14 1730 10/04/14 1734 10/05/14 0040  LATICACIDVEN 4.2*  --  2.6* 2.45*  --   PROCALCITON  --  1.16  --   --  1.62   ABG No results for input(s): PHART, PCO2ART, PO2ART in the last 168 hours. Liver Enzymes  Recent Labs Lab 10/04/14 1125 10/04/14 1430  AST 66* 54*  ALT 36 33  ALKPHOS 118* 101  BILITOT 3.7* 4.1*  ALBUMIN 2.7* 2.4*   Cardiac Enzymes  Recent Labs Lab 10/04/14 1430 10/04/14 2007 10/05/14 0040  TROPONINI <0.03 <0.03 <0.03   Glucose  Recent Labs Lab 10/04/14 1202 10/04/14 1759 10/04/14 2220 10/05/14 0811 10/05/14 1120  GLUCAP 114* 144* 204* 115* 125*   Imaging Dg Chest 2 View  10/04/2014   CLINICAL DATA:  Shortness of breath, weakness, hypertension, diabetes, cirrhosis  EXAM: CHEST  2 VIEW  COMPARISON:  08/14/2014  FINDINGS: Normal heart size, mediastinal contours and pulmonary vascularity.  Atherosclerotic calcification aorta.  Decreased lung volumes with interstitial infiltrates  in the mid to lower lungs bilaterally, peripheral predominance, question infection or edema.  No pleural effusion or pneumothorax.  Bones unremarkable.  IMPRESSION: Interstitial infiltrates bilaterally, peripheral predominance, question infection or edema.   Electronically Signed   By: Lavonia Dana M.D.   On: 10/04/2014 12:08   Ct Angio Chest Pe W/cm &/or Wo Cm  10/04/2014   CLINICAL DATA:  Shortness of breath since January 2016.  Cirrhosis.  EXAM: CT ANGIOGRAPHY CHEST WITH CONTRAST  TECHNIQUE: Multidetector CT imaging of the chest was performed using the standard protocol during bolus administration of intravenous contrast. Multiplanar CT image reconstructions and MIPs were  obtained to evaluate the vascular anatomy.  CONTRAST:  40mL OMNIPAQUE IOHEXOL 350 MG/ML SOLN  COMPARISON:  08/17/2014  FINDINGS: THORACIC INLET/BODY WALL:  Gynecomastia which is symmetric.  MEDIASTINUM:  Prominent left ventricular size. No pericardial effusion. No aortic aneurysm or dissection. There is diffuse atherosclerosis, including the left coronary circulation. No pulmonary arterial filling defect (when accounting for motion at the level of the superior segment right lower lobe). No lymphadenopathy.  LUNG WINDOWS:  Moderate left pleural effusion which is stable from previous. The fluid is layering and simple in density.  There is subpleural reticulation which is similar to 08/17/2014. More nodular areas of ground-glass density noted on previous imaging appeares decreased or resolved. This could have represented acute alveolitis. There is no traction bronchiectasis or honeycombing to suggest fibrosis. No typical pulmonary edema. No consolidating pneumonia.  UPPER ABDOMEN:  Cirrhosis with splenomegaly and ascites. There are large varices in the epigastrium, neighboring the gastric cardia.  OSSEOUS:  No acute fracture.  No suspicious lytic or blastic lesions.  Review of the MIP images confirms the above findings.  IMPRESSION: 1. No evidence of pulmonary embolism. 2. Chronic, small to moderate left pleural effusion. 3. Persistent subpleural reticulation throughout the bilateral lungs. Persistence raises the possibility of interstitial lung disease (especially NSIP). Given patient's continued/worsening shortness of breath, consider outpatient high-resolution chest CT. 4. Cirrhosis with varices and large ascites.   Electronically Signed   By: Monte Fantasia M.D.   On: 10/04/2014 16:28   ASSESSMENT / PLAN:  PULMONARY Acute hypoxic respiratory failure - r/t HCAP +/- viral illness RUL nodule P:   Supplemental O2 as needed  IV abx as below  Empiric tamiflu  PRN BD  Aggressive pulm hygiene  CT scan for  lung nodule noted will need high resolution CT as outpatient and outpatient follow up for pulmonary nodule. Pt DNI per Dr. Ledell Noss note.  CARDIOVASCULAR Sepsis  Hypotension -- per pt baseline SBP ~100 Hx HTN P:  Cont volume - ensure 30cc/kg  F/u lactate, pct  Hold home rx Lactic acid clearing  RENAL Hyponatremia - mild  P:   F/u chem   GASTROINTESTINAL NASH - stable  P:   F/u LFT's  Monitor INR intermittently  Diet as tol  Amonia noted Hold home diuretics  F/u Dr. Paulita Fujita   HEMATOLOGIC Thrombocytopenia - chronic. Mild  P:  SQ heparin  F/u cbc   INFECTIOUS HCAP  R/o flu - PCR neg  P:   BCx2 3/17>>> UC 3/17>>> Sputum 3/17>>>  Cefepime 3/17>>> Vanc 3/17>>> Tamiflu 3/17>>>  ENDOCRINE DM  P:   SSI  NEUROLOGIC No active issue  P:   Monitor    FAMILY  - Updates:  Pt, family updated at bedside   Code status noted, PCCM will sign off, please call back if needed.  Rush Farmer, M.D. [facility] Pulmonary/Critical Care Medicine. Pager:  [phone]. After hours pager: [phone].  10/05/2014  12:29 PM

## 2014-10-06 ENCOUNTER — Inpatient Hospital Stay (HOSPITAL_COMMUNITY): Payer: Medicare Other

## 2014-10-06 DIAGNOSIS — A401 Sepsis due to streptococcus, group B: Secondary | ICD-10-CM

## 2014-10-06 DIAGNOSIS — E43 Unspecified severe protein-calorie malnutrition: Secondary | ICD-10-CM | POA: Insufficient documentation

## 2014-10-06 LAB — COMPREHENSIVE METABOLIC PANEL
ALK PHOS: 98 U/L (ref 39–117)
ALT: 32 U/L (ref 0–53)
AST: 55 U/L — ABNORMAL HIGH (ref 0–37)
Albumin: 1.9 g/dL — ABNORMAL LOW (ref 3.5–5.2)
Anion gap: 4 — ABNORMAL LOW (ref 5–15)
BUN: 15 mg/dL (ref 6–23)
CO2: 18 mmol/L — ABNORMAL LOW (ref 19–32)
CREATININE: 0.85 mg/dL (ref 0.50–1.35)
Calcium: 7.6 mg/dL — ABNORMAL LOW (ref 8.4–10.5)
Chloride: 111 mmol/L (ref 96–112)
GFR calc Af Amer: >90 mL/min (ref >90)
GFR, EST NON AFRICAN AMERICAN: 89 mL/min — AB (ref >90)
GLUCOSE: 110 mg/dL — AB (ref 70–99)
Potassium: 3.5 mmol/L (ref 3.5–5.1)
Sodium: 133 mmol/L — ABNORMAL LOW (ref 135–145)
TOTAL PROTEIN: 6 g/dL (ref 6.0–8.3)
Total Bilirubin: 2.1 mg/dL — ABNORMAL HIGH (ref 0.3–1.2)

## 2014-10-06 LAB — CBC
HCT: 25.8 % — ABNORMAL LOW (ref 39.0–52.0)
HEMOGLOBIN: 8.9 g/dL — AB (ref 13.0–17.0)
MCH: 31.3 pg (ref 26.0–34.0)
MCHC: 34.5 g/dL (ref 30.0–36.0)
MCV: 90.8 fL (ref 78.0–100.0)
Platelets: 48 10*3/uL — ABNORMAL LOW (ref 150–400)
RBC: 2.84 MIL/uL — AB (ref 4.22–5.81)
RDW: 17 % — AB (ref 11.5–15.5)
WBC: 3.9 10*3/uL — ABNORMAL LOW (ref 4.0–10.5)

## 2014-10-06 LAB — GLUCOSE, CAPILLARY
GLUCOSE-CAPILLARY: 142 mg/dL — AB (ref 70–99)
Glucose-Capillary: 180 mg/dL — ABNORMAL HIGH (ref 70–99)
Glucose-Capillary: 225 mg/dL — ABNORMAL HIGH (ref 70–99)
Glucose-Capillary: 139 mg/dL — ABNORMAL HIGH (ref 70–99)

## 2014-10-06 LAB — LACTIC ACID, PLASMA: Lactic Acid, Venous: 1.6 mmol/L (ref 0.5–2.0)

## 2014-10-06 LAB — PROCALCITONIN: Procalcitonin: 1.13 ng/mL

## 2014-10-06 LAB — HEMOGLOBIN A1C
Hgb A1c MFr Bld: 6.6 % — ABNORMAL HIGH (ref 4.8–5.6)
Mean Plasma Glucose: 143 mg/dL

## 2014-10-06 LAB — URINE CULTURE

## 2014-10-06 LAB — AMMONIA: Ammonia: 45 umol/L — ABNORMAL HIGH (ref 11–32)

## 2014-10-06 MED ORDER — OXYCODONE HCL 5 MG PO TABS
10.0000 mg | ORAL_TABLET | Freq: Once | ORAL | Status: AC
  Administered 2014-10-06: 10 mg via ORAL
  Filled 2014-10-06: qty 2

## 2014-10-06 MED ORDER — COUMADIN BOOK
Freq: Once | Status: DC
  Filled 2014-10-06: qty 1

## 2014-10-06 NOTE — Progress Notes (Signed)
Transferred patient to 2J58 per hospital bed.  He is alert and oriented.  He stated that he will call his daughter to notify her of this transfer.

## 2014-10-06 NOTE — Evaluation (Signed)
Physical Therapy Evaluation Patient Details Name: Garrett Turner MRN: [id number] DOB: [birth year] Today's Date: 10/06/2014   History of Present Illness  Patient is a 64 yo male admitted 10/04/14 with SOB and hypoxia.  Patient with HCAP and sepsis, and has developed Lt lateral chest pleuritic pain.   PMH:  cirrhosis due to NASH, pneumonia, DM, HTN, anxiety, depression  Clinical Impression  Patient presents with problems listed below.  Will benefit from acute PT to maximize functional mobility prior to discharge home.  Patient will need to function at Mod I level to return home with prn assist.  If unable to achieve this level, may need to consider ST-SNF.    Follow Up Recommendations Home health PT;Supervision/Assistance - 24 hour    Equipment Recommendations  None recommended by PT    Recommendations for Other Services       Precautions / Restrictions Precautions Precautions: Fall Restrictions Weight Bearing Restrictions: No      Mobility  Bed Mobility Overal bed mobility: Needs Assistance Bed Mobility: Supine to Sit;Sit to Supine     Supine to sit: Min guard Sit to supine: Min guard   General bed mobility comments: Patient using bed rail to move to sitting.  Able to move LE's off of and onto bed.  Assist for safety only.  Transfers Overall transfer level: Needs assistance Equipment used: None Transfers: Sit to/from Stand Sit to Stand: Min assist         General transfer comment: Assist to steady during transfers for safety.  Ambulation/Gait Ambulation/Gait assistance: Min assist;+2 safety/equipment Ambulation Distance (Feet): 24 Feet Assistive device: 1 person hand held assist Gait Pattern/deviations: Step-through pattern;Decreased stride length;Shuffle Gait velocity: Decreased Gait velocity interpretation: Below normal speed for age/gender General Gait Details: Verbal cues to stand for several seconds before ambulation.  Patient with slow shuffling gait  pattern, using hand-hold assist for balance.  Stairs            Wheelchair Mobility    Modified Rankin (Stroke Patients Only)       Balance                                             Pertinent Vitals/Pain Pain Assessment: 0-10 Pain Score: 5  Pain Location: Lt lateral chest Pain Descriptors / Indicators: Aching;Sharp Pain Intervention(s): Monitored during session;Repositioned    Home Living Family/patient expects to be discharged to:: Private residence Living Arrangements: Alone Available Help at Discharge: Family;Friend(s);Neighbor;Available PRN/intermittently (Daughter, friend, and neighbor assist patient) Type of Home: House Home Access: Level entry     Home Layout: One level Home Equipment: Walker - 2 wheels;Grab bars - toilet;Grab bars - tub/shower;Hand held shower head;Shower seat      Prior Function Level of Independence: Independent with assistive device(s);Needs assistance   Gait / Transfers Assistance Needed: Patient uses RW for ambulation  ADL's / Homemaking Assistance Needed: Daughter assists with housekeeping.  Daughter and friend run errands/groceries.        Hand Dominance   Dominant Hand: Right    Extremity/Trunk Assessment   Upper Extremity Assessment: Overall WFL for tasks assessed           Lower Extremity Assessment: Generalized weakness         Communication   Communication: No difficulties  Cognition Arousal/Alertness: Awake/alert Behavior During Therapy: WFL for tasks assessed/performed Overall Cognitive Status: Within Functional Limits for tasks assessed  General Comments      Exercises        Assessment/Plan    PT Assessment Patient needs continued PT services  PT Diagnosis Difficulty walking;Abnormality of gait;Generalized weakness;Acute pain   PT Problem List Decreased strength;Decreased activity tolerance;Decreased balance;Decreased mobility;Decreased  knowledge of use of DME;Cardiopulmonary status limiting activity;Pain  PT Treatment Interventions DME instruction;Gait training;Functional mobility training;Therapeutic activities;Therapeutic exercise;Patient/family education   PT Goals (Current goals can be found in the Care Plan section) Acute Rehab PT Goals Patient Stated Goal: To go home PT Goal Formulation: With patient Time For Goal Achievement: 10/13/14 Potential to Achieve Goals: Good    Frequency Min 3X/week   Barriers to discharge Decreased caregiver support Patient lives alone.  Has prn assist only.    Co-evaluation               End of Session Equipment Utilized During Treatment: Gait belt Activity Tolerance: Patient limited by fatigue (Dyspnea 3/4 with gait, with O2 sats remaining at 96%) Patient left: in bed;with call bell/phone within reach;with bed alarm set Nurse Communication: Mobility status         Time: 4920-1007 PT Time Calculation (min) (ACUTE ONLY): 20 min   Charges:   PT Evaluation $Initial PT Evaluation Tier I: 1 Procedure     PT G CodesDespina Pole 10-13-2014, 6:06 PM Carita Pian. Sanjuana Kava, [place] Pager [phone]

## 2014-10-06 NOTE — Progress Notes (Signed)
[place] TEAM 1 - Stepdown/ICU TEAM Progress Note  Codylee Patil IOE:[id number] DOB: [birth year] DOA: 10/04/2014 PCP: Lorayne Marek, MD  Admit HPI / Brief Narrative: 64 y.o. Male with history of cirrhosis due to [facility], recent admission for pneumonia, ascites which is in good control on diuretics, depression, DM type II, right upper lobe nodule needs a repeat CT scan in the outpatient setting and pulmonary follow-up, and a history of gallstones, who came to the ED in referral from the local free clinic for evaluation of shortness of breath and hypoxia.  In the ER workup was consistent with HCAP + sepsis with metabolic acidosis. Patient noted chills and subjective fevers with severe rigors, as well as shortness of breath for a few days.  HPI/Subjective: Pt is alert and oriented.  He has developed a severe L lateral chest pleuritic pain that has worsened over the course of the day.  He denies sob, n/v, or abdom pain.     Assessment/Plan:  Septic shock secondary to HCAP Baseline SBP reportedly ~100 - BP now stable and better than reported baseline - hold BP meds for now and follow   Acute hypoxic respiratory failure due to HCAP CTa chest notes subpleural reticulation B suggestive of NSIP - outpt high resolution CT chest suggested - pt has been afebrile here, w/ normal WBC, but reported rigors at home - procalcitonin is signif elevated - cont emperic tx for HCAP w/ planned 7 day abx course - flu panel negative so stopped tamiflu   L pleuritic chest wall pain Likely simple pulled intercostal muscle, but will obtain f/u CXR to r/o acute issue - good air movement noted in L lung fields   1/2 Blood cx + Group B strep Should be more than adequately covered w/ the current abx regimen - source unclear - UA unremarkable - will plan for f/u blood cx 2 days after completing abx course   Chronic L pleural effusion Not clinically significant at this time - follow  NASH - cirrhosis of the liver    Resume maintenance medications - avoid diuretics for now   Hyponatremia Likely due to cirrhosis - stable   DM2 Reasonably controlled at this time with episodic spikes - follow trend   Chronic Thrombocytopenia Due to cirrhosis - stable - will follow  RUL lung nodule  13 mm RUL nodule seen on CTA January 2016 - not noted on CTa chest this admit - will need high resolution CT chest as outpt as noted above   Depression  Elevated d-Dimer No PE on CTA chest   Code Status: DO NOT INTUBATE  Family Communication: no family present at time of exam Disposition Plan: stable for transfer to med bed - f/u CXR - begin PT/OT - watch volume status   Consultants: PCCM  Procedures: none  Antibiotics: Zosyn 3/17 Cefepime 3/17> Vanc 3/17>  Tamiflu 3/17>3/18  DVT prophylaxis: SCDs  Objective: Blood pressure 108/66, pulse 68, temperature 97.6 F (36.4 C), temperature source Oral, resp. rate 16, height 6\' 1"  (1.854 m), weight 72.031 kg (158 lb 12.8 oz), SpO2 96 %.  Intake/Output Summary (Last 24 hours) at 10/06/14 1615 Last data filed at 10/06/14 1134  Gross per 24 hour  Intake      0 ml  Output    900 ml  Net   -900 ml   Exam: General: No acute respiratory distress Lungs: fine diffuse crackles - no wheeze - good air movement th/o B lung fields  Cardiovascular: Regular rate and rhythm without  murmur gallop or rub  Abdomen: Nontender, nondistended, soft, bowel sounds positive, no rebound, no ascites, no appreciable mass Extremities: No significant cyanosis, clubbing, or edema bilateral lower extremities  Data Reviewed: Basic Metabolic Panel:  Recent Labs Lab 10/04/14 1125 10/04/14 1430 10/05/14 0040 10/06/14 0311  NA 136 133* 134* 133*  K 4.6 4.3 3.8 3.5  CL 105 105 109 111  CO2 14* 15* 20 18*  GLUCOSE 139* 153* 170* 110*  BUN 20 21 19 15   CREATININE 1.28 1.18 1.05 0.85  CALCIUM 9.0 8.2* 7.7* 7.6*    Liver Function Tests:  Recent Labs Lab 10/04/14 1125  10/04/14 1430 10/06/14 0311  AST 66* 54* 55*  ALT 36 33 32  ALKPHOS 118* 101 98  BILITOT 3.7* 4.1* 2.1*  PROT 7.9 7.2 6.0  ALBUMIN 2.7* 2.4* 1.9*    Recent Labs Lab 10/04/14 1430 10/06/14 0311  AMMONIA 48* 45*   Coags:  Recent Labs Lab 10/04/14 1125 10/04/14 1430  INR 1.58* 1.66*    Recent Labs Lab 10/04/14 1125  APTT 38*   CBC:  Recent Labs Lab 10/04/14 1125 10/04/14 1430 10/04/14 2007 10/05/14 0040 10/06/14 0311  WBC 9.7 9.9 7.2 6.1 3.9*  NEUTROABS 8.3*  --   --   --   --   HGB 11.0* 9.8* 8.6* 8.4* 8.9*  HCT 32.0* 28.2* 24.6* 24.0* 25.8*  MCV 92.5 91.0 91.1 90.6 90.8  PLT 71* 43* 51* 45* 48*    Cardiac Enzymes:  Recent Labs Lab 10/04/14 1125 10/04/14 1430 10/04/14 2007 10/05/14 0040  TROPONINI 0.04* <0.03 <0.03 <0.03   CBG:  Recent Labs Lab 10/05/14 1824 10/05/14 2241 10/06/14 0724 10/06/14 1134 10/06/14 1547  GLUCAP 201* 126* 142* 180* 225*    Recent Results (from the past 240 hour(s))  Culture, blood (routine x 2)     Status: None (Preliminary result)   Collection Time: 10/04/14  2:01 PM  Result Value Ref Range Status   Specimen Description BLOOD ARM RIGHT  Final   Special Requests BOTTLES DRAWN AEROBIC AND ANAEROBIC 10CC  Final   Culture   Final    GROUP B STREP(S.AGALACTIAE)ISOLATED Note: Gram Stain Report Called to,Read Back By and Verified With: TATA CARBONE @ 8:44 AM 10/05/14 BY DWEEKS Performed at [facility]    Report Status PENDING  Incomplete  Culture, blood (routine x 2)     Status: None (Preliminary result)   Collection Time: 10/04/14  2:13 PM  Result Value Ref Range Status   Specimen Description BLOOD RIGHT FOREARM  Final   Special Requests BOTTLES DRAWN AEROBIC AND ANAEROBIC 10CC  Final   Culture   Final           BLOOD CULTURE RECEIVED NO GROWTH TO DATE CULTURE WILL BE HELD FOR 5 DAYS BEFORE ISSUING A FINAL NEGATIVE REPORT Performed at [facility]    Report Status PENDING  Incomplete  MRSA  PCR Screening     Status: None   Collection Time: 10/04/14  8:17 PM  Result Value Ref Range Status   MRSA by PCR NEGATIVE NEGATIVE Final    Comment:        The GeneXpert MRSA Assay (FDA approved for NASAL specimens only), is one component of a comprehensive MRSA colonization surveillance program. It is not intended to diagnose MRSA infection nor to guide or monitor treatment for MRSA infections.      Studies:  Recent x-ray studies have been reviewed in detail by the Attending Physician  Scheduled Meds:  Scheduled Meds: . ceFEPime (MAXIPIME) IV  1 g Intravenous 3 times per day  . escitalopram  10 mg Oral Daily  . feeding supplement (ENSURE COMPLETE)  237 mL Oral BID BM  . insulin aspart  0-5 Units Subcutaneous QHS  . insulin aspart  0-9 Units Subcutaneous TID WC  . lactulose  20 g Oral BID  . mirtazapine  15 mg Oral QHS  . sodium chloride  3 mL Intravenous Q12H  . vancomycin  1,000 mg Intravenous Q12H    Time spent on care of this patient: 35 mins   Mariel Gaudin T , MD   [facility] Office  [phone] Pager - Text Page per Shea Evans as per below:  On-Call/Text Page:      [email or web address]      password [email or web address]  If 7PM-7AM, please contact night-coverage [email or web address] Password [email or web address] 10/06/2014, 4:15 PM   LOS: 2 days

## 2014-10-07 DIAGNOSIS — A403 Sepsis due to Streptococcus pneumoniae: Secondary | ICD-10-CM

## 2014-10-07 LAB — COMPREHENSIVE METABOLIC PANEL
ALBUMIN: 2.2 g/dL — AB (ref 3.5–5.2)
ALT: 35 U/L (ref 0–53)
AST: 65 U/L — ABNORMAL HIGH (ref 0–37)
Alkaline Phosphatase: 115 U/L (ref 39–117)
Anion gap: 5 (ref 5–15)
BILIRUBIN TOTAL: 2.3 mg/dL — AB (ref 0.3–1.2)
BUN: 13 mg/dL (ref 6–23)
CHLORIDE: 108 mmol/L (ref 96–112)
CO2: 20 mmol/L (ref 19–32)
Calcium: 7.9 mg/dL — ABNORMAL LOW (ref 8.4–10.5)
Creatinine, Ser: 0.89 mg/dL (ref 0.50–1.35)
GFR calc Af Amer: >90 mL/min (ref >90)
GFR, EST NON AFRICAN AMERICAN: 88 mL/min — AB (ref >90)
Glucose, Bld: 147 mg/dL — ABNORMAL HIGH (ref 70–99)
Potassium: 3.9 mmol/L (ref 3.5–5.1)
Sodium: 133 mmol/L — ABNORMAL LOW (ref 135–145)
Total Protein: 6.6 g/dL (ref 6.0–8.3)

## 2014-10-07 LAB — CBC
HEMATOCRIT: 29.4 % — AB (ref 39.0–52.0)
Hemoglobin: 9.9 g/dL — ABNORMAL LOW (ref 13.0–17.0)
MCH: 31.1 pg (ref 26.0–34.0)
MCHC: 33.7 g/dL (ref 30.0–36.0)
MCV: 92.5 fL (ref 78.0–100.0)
Platelets: 80 10*3/uL — ABNORMAL LOW (ref 150–400)
RBC: 3.18 MIL/uL — ABNORMAL LOW (ref 4.22–5.81)
RDW: 17.1 % — AB (ref 11.5–15.5)
WBC: 6.7 10*3/uL (ref 4.0–10.5)

## 2014-10-07 LAB — GLUCOSE, CAPILLARY
GLUCOSE-CAPILLARY: 131 mg/dL — AB (ref 70–99)
GLUCOSE-CAPILLARY: 198 mg/dL — AB (ref 70–99)
Glucose-Capillary: 172 mg/dL — ABNORMAL HIGH (ref 70–99)
Glucose-Capillary: 172 mg/dL — ABNORMAL HIGH (ref 70–99)

## 2014-10-07 LAB — CULTURE, BLOOD (ROUTINE X 2)

## 2014-10-07 LAB — VANCOMYCIN, TROUGH: Vancomycin Tr: 16.5 ug/mL (ref 10.0–20.0)

## 2014-10-07 MED ORDER — LEVOFLOXACIN IN D5W 750 MG/150ML IV SOLN
750.0000 mg | INTRAVENOUS | Status: DC
  Administered 2014-10-07 – 2014-10-08 (×2): 750 mg via INTRAVENOUS
  Filled 2014-10-07 (×2): qty 150

## 2014-10-07 MED ORDER — OXYCODONE HCL 5 MG PO TABS
5.0000 mg | ORAL_TABLET | Freq: Four times a day (QID) | ORAL | Status: DC | PRN
  Administered 2014-10-07: 10 mg via ORAL
  Filled 2014-10-07: qty 2

## 2014-10-07 MED ORDER — BISACODYL 5 MG PO TBEC: 5.0000 mg | DELAYED_RELEASE_TABLET | Freq: Once | ORAL | Status: DC

## 2014-10-07 MED ORDER — RISAQUAD PO CAPS
2.0000 | ORAL_CAPSULE | Freq: Every day | ORAL | Status: DC
  Administered 2014-10-07 – 2014-10-09 (×3): 2 via ORAL
  Filled 2014-10-07 (×3): qty 2

## 2014-10-07 NOTE — Progress Notes (Addendum)
ANTIBIOTIC CONSULT NOTE - FOLLOW UP  Pharmacy Consult for Vancomycin/cefepime Indication: HCAP No Known Allergies  Patient Measurements: Height: 6\' 1"  (185.4 cm) Weight: 166 lb 3.2 oz (75.388 kg) IBW/kg (Calculated) : 79.9  Vital Signs: Temp: 97.8 F (36.6 C) (03/20 0535) Temp Source: Oral (03/20 0535) BP: 121/88 mmHg (03/20 0535) Pulse Rate: 75 (03/20 0535) Intake/Output from previous day: 03/19 0701 - 03/20 0700 In: 540 [P.O.:490; IV Piggyback:50] Out: 250 [Urine:250] Intake/Output from this shift: Total I/O In: 360 [P.O.:360] Out: -   Labs:  Recent Labs  10/05/14 0040 10/06/14 0311 10/07/14 0600  WBC 6.1 3.9* 6.7  HGB 8.4* 8.9* 9.9*  PLT 45* 48* 80*  CREATININE 1.05 0.85 0.89   Estimated Creatinine Clearance: 88.2 mL/min (by C-G formula based on Cr of 0.89).  Recent Labs  10/07/14 0600  VANCOTROUGH 16.5     Assessment: Day #4/7 for HCAP. Pt has been afebrile, wbc 6.7, PCT 1.62>1.13, LA 4.2>>1.6; MRSA pcr (-). Flu PCR neg, tamiflu d/c'd. Vancomycin trough this morning = 16.5 at goal. Renal function remains stable. Scr 0.89, est. crcl ~ 85 ml/min.  Cefepime 3/17>> vanc 3/17>> Tamiflu 3/17 >>3/18  3/17 BCx2>> 1/2 Group B strep (S - Amp, clinda, lvq, pen, vanc, R- erythro) 3/18 urine >> insignificant growth 3/17 flu pcr>> negative  Goal of Therapy:  Vancomycin trough level 15-20 mcg/ml  Plan:  - Continue vancomycin 1g IV Q 12 hrs - Continue cefepime 1g IV Q 8 hrs - Continue monitor renal function - Please consider recheck blood culture to r/o GBS bacteremia   Maryanna Shape, PharmD, BCPS  Clinical Pharmacist  Pager: [phone]   10/07/2014,11:50 AM    Addendum: Now to change vanc/cefepime to Levaquin. Est. Crcl. ~ 85 ml/min. Repeat blood culture ordered  Plan:  Levaquin 750 mg IV Q 24 hrs F/u culture, change to PO repeat blood cx negative.  Maryanna Shape, PharmD, BCPS  Clinical Pharmacist  Pager: [phone]

## 2014-10-07 NOTE — Progress Notes (Signed)
*  Preliminary Results* Bilateral lower extremity venous duplex completed. Bilateral lower extremities are negative for deep vein thrombosis. There is no evidence of Baker's cyst bilaterally.  10/07/2014  Maudry Mayhew, RVT, RDCS, RDMS

## 2014-10-07 NOTE — Evaluation (Signed)
Occupational Therapy Evaluation Patient Details Name: Garrett Turner MRN: [id number] DOB: 1950-11-20 Today's Date: 10/07/2014    History of Present Illness Patient is a 64 yo male admitted 10/04/14 with SOB and hypoxia.  Patient with HCAP and sepsis, and has developed Lt lateral chest pleuritic pain.   PMH:  cirrhosis due to NASH, pneumonia, DM, HTN, anxiety, depression   Clinical Impression   PTA pt reports independence with ADLs with use of RW. His daughter assists with homecare and groceries. Pt is currently limited by left lateral chest pain and decreased cardiopulmonary status, as well as generalized weakness. Pt will benefit from acute OT to progress to Mod I level to return home with intermittent supervision from family.     Follow Up Recommendations  Home health OT;Supervision/Assistance - 24 hour    Equipment Recommendations  None recommended by OT    Recommendations for Other Services       Precautions / Restrictions Precautions Precautions: Fall Restrictions Weight Bearing Restrictions: No      Mobility Bed Mobility Overal bed mobility: Modified Independent                Transfers Overall transfer level: Needs assistance Equipment used: None Transfers: Sit to/from Stand Sit to Stand: Supervision         General transfer comment: Supervision for safety.          ADL Overall ADL's : Needs assistance/impaired                                       General ADL Comments: Pt at Supervision level for functional mobility and ADLs today. Pt was able to don Bil socks sitting in bed. Supervision for sit<>stand from EOB and pt wanted to ambulate in hallway to address endurance and cardiopulmonary status. Pt took 2 standing rest breaks with pursed lip breathing during ambulation. After return to room, SPO2 was noted to be 88% on RA. After ~23minute sitting, SPO2 returned to 95%. Suspect that left lateral chest pain is limiting pt's ability to  take deep breathes and contributing to SPO2 drop.      Vision Vision Assessment?: No apparent visual deficits          Pertinent Vitals/Pain Pain Assessment: 0-10 Pain Score: 3  Pain Location: Lt lateral chest Pain Descriptors / Indicators: Aching;Sharp Pain Intervention(s): Limited activity within patient's tolerance;Monitored during session;Repositioned     Hand Dominance Right   Extremity/Trunk Assessment Upper Extremity Assessment Upper Extremity Assessment: Overall WFL for tasks assessed   Lower Extremity Assessment Lower Extremity Assessment: Generalized weakness   Cervical / Trunk Assessment Cervical / Trunk Assessment: Normal   Communication Communication Communication: No difficulties   Cognition Arousal/Alertness: Awake/alert Behavior During Therapy: WFL for tasks assessed/performed Overall Cognitive Status: Within Functional Limits for tasks assessed                                Home Living Family/patient expects to be discharged to:: Private residence Living Arrangements: Alone Available Help at Discharge: Family;Friend(s);Neighbor;Available PRN/intermittently (daughter, friend, and neighbor assist patient) Type of Home: House Home Access: Level entry     Home Layout: One level     Bathroom Shower/Tub: Occupational psychologist: Handicapped height Bathroom Accessibility: Yes How Accessible: Accessible via walker Home Equipment: Park - 2 wheels;Grab bars - toilet;Grab bars - tub/shower;Hand  held shower head;Shower seat          Prior Functioning/Environment Level of Independence: Independent with assistive device(s)  Gait / Transfers Assistance Needed: Patient uses RW for ambulation ADL's / Homemaking Assistance Needed: Daughter assists with housekeeping.  Daughter and friend run errands/groceries.        OT Diagnosis: Generalized weakness;Other (comment) (decreased cardiopulmonary status)   OT Problem List:  Decreased strength;Decreased activity tolerance;Impaired balance (sitting and/or standing);Cardiopulmonary status limiting activity;Pain   OT Treatment/Interventions: Self-care/ADL training;Therapeutic exercise;Energy conservation;DME and/or AE instruction;Therapeutic activities;Patient/family education;Balance training    OT Goals(Current goals can be found in the care plan section) Acute Rehab OT Goals Patient Stated Goal: to have my independence OT Goal Formulation: With patient Time For Goal Achievement: 10/21/14 Potential to Achieve Goals: Good ADL Goals Pt Will Perform Grooming: with modified independence;standing Pt Will Perform Lower Body Bathing: with modified independence;sit to/from stand Pt Will Perform Lower Body Dressing: with modified independence;sit to/from stand Pt Will Transfer to Toilet: with modified independence;ambulating Pt Will Perform Toileting - Clothing Manipulation and hygiene: with modified independence;sit to/from stand Pt Will Perform Tub/Shower Transfer: with modified independence;ambulating  OT Frequency: Min 2X/week    End of Session Equipment Utilized During Treatment: Gait belt;Rolling walker  Activity Tolerance: Patient tolerated treatment well Patient left: in bed;with call bell/phone within reach   Time: 1515-1539 OT Time Calculation (min): 24 min Charges:  OT General Charges $OT Visit: 1 Procedure OT Evaluation $Initial OT Evaluation Tier I: 1 Procedure OT Treatments $Therapeutic Activity: 8-22 mins G-Codes:    Villa Herb M 10/10/2014, 4:03 PM   Cyndie Chime, OTR/L Occupational Therapist [phone] (pager)

## 2014-10-07 NOTE — Progress Notes (Signed)
Progress Note  Garrett Turner ZJQ:[id number] DOB: September 29, 1950 DOA: 10/04/2014 PCP: Lorayne Marek, MD  Admit HPI / Brief Narrative: 64 y.o. Male with history of cirrhosis due to [facility], recent admission for pneumonia, ascites which is in good control on diuretics, depression, DM type II, right upper lobe nodule needs a repeat CT scan in the outpatient setting and pulmonary follow-up, and a history of gallstones, who came to the ED in referral from the local free clinic for evaluation of shortness of breath and hypoxia.  In the ER workup was consistent with HCAP + sepsis with metabolic acidosis. Patient noted chills and subjective fevers with severe rigors, as well as shortness of breath for a few days.  HPI/Subjective: Pt is alert and oriented.  He has developed a severe L lateral chest pleuritic pain that has worsened over the course of the day.  He denies sob, n/v, or abdom pain.     Assessment/Plan:  Septic shock secondary to HCAP Baseline SBP reportedly ~100 - BP now stable and better than reported baseline - hold BP meds for now and follow   Acute hypoxic respiratory failure due to HCAP CTa chest notes subpleural reticulation B suggestive of NSIP - outpt high resolution CT chest suggested - pt has been afebrile here, w/ normal WBC, but reported rigors at home - procalcitonin is signif elevated - cont emperic tx for HCAP w/ planned 7 day abx course - flu panel negative so stopped tamiflu  - In addition IV vancomycin and cefepime to levofloxacin 3/20 ( Streptococcus sensitive to levofloxacin on blood culture)  L pleuritic chest wall pain Likely simple pulled intercostal muscle, but will obtain f/u CXR to r/o acute issue - good air movement noted in L lung fields  - Incentive spirometry, when necessary pain medicine.  1/2 Blood cx + Group B strep Sensitive to levofloxacin, antibiotics changed to levofloxacin, repeat blood cultures.  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 rate 18, height 6\' 1"  (1.854 m), weight 75.388 kg (166 lb 3.2 oz), SpO2 95 %.  Intake/Output Summary (Last 24 hours) at 10/07/14 1428 Last data filed at 10/07/14 2409  Gross per 24 hour  Intake    660 ml  Output    100 ml  Net    560 ml   Exam: General: No acute respiratory distress Lungs: fine diffuse crackles - no wheeze - good air movement th/o B lung fields  Cardiovascular: Regular rate and rhythm without murmur gallop or rub  Abdomen: Nontender, nondistended, soft, bowel sounds positive, no rebound, no ascites, no appreciable  mass Extremities: No significant cyanosis, clubbing, or edema bilateral lower extremities  Data Reviewed: Basic Metabolic Panel:  Recent Labs Lab 10/04/14 1125 10/04/14 1430 10/05/14 0040 10/06/14 0311 10/07/14 0600  NA 136 133* 134* 133* 133*  K 4.6 4.3 3.8 3.5 3.9  CL 105 105 109 111 108  CO2 14* 15* 20 18* 20  GLUCOSE 139* 153* 170* 110* 147*  BUN 20 21 19 15 13   CREATININE 1.28 1.18 1.05 0.85 0.89  CALCIUM 9.0 8.2* 7.7* 7.6* 7.9*    Liver  Function Tests:  Recent Labs Lab 10/04/14 1125 10/04/14 1430 10/06/14 0311 10/07/14 0600  AST 66* 54* 55* 65*  ALT 36 33 32 35  ALKPHOS 118* 101 98 115  BILITOT 3.7* 4.1* 2.1* 2.3*  PROT 7.9 7.2 6.0 6.6  ALBUMIN 2.7* 2.4* 1.9* 2.2*    Recent Labs Lab 10/04/14 1430 10/06/14 0311  AMMONIA 48* 45*   Coags:  Recent Labs Lab 10/04/14 1125 10/04/14 1430  INR 1.58* 1.66*    Recent Labs Lab 10/04/14 1125  APTT 38*   CBC:  Recent Labs Lab 10/04/14 1125 10/04/14 1430 10/04/14 2007 10/05/14 0040 10/06/14 0311 10/07/14 0600  WBC 9.7 9.9 7.2 6.1 3.9* 6.7  NEUTROABS 8.3*  --   --   --   --   --   HGB 11.0* 9.8* 8.6* 8.4* 8.9* 9.9*  HCT 32.0* 28.2* 24.6* 24.0* 25.8* 29.4*  MCV 92.5 91.0 91.1 90.6 90.8 92.5  PLT 71* 43* 51* 45* 48* 80*    Cardiac Enzymes:  Recent Labs Lab 10/04/14 1125 10/04/14 1430 10/04/14 2007 10/05/14 0040  TROPONINI 0.04* <0.03 <0.03 <0.03   CBG:  Recent Labs Lab 10/06/14 1134 10/06/14 1547 10/06/14 2155 10/07/14 0823 10/07/14 1142  GLUCAP 180* 225* 139* 131* 198*    Recent Results (from the past 240 hour(s))  Culture, blood (routine x 2)     Status: None   Collection Time: 10/04/14  2:01 PM  Result Value Ref Range Status   Specimen Description BLOOD ARM RIGHT  Final   Special Requests BOTTLES DRAWN AEROBIC AND ANAEROBIC 10CC  Final   Culture   Final    GROUP B STREP(S.AGALACTIAE)ISOLATED Note: This organism DOES NOT demonstrate inducible Clindamycin resistance in vitro. Note: Gram Stain Report Called to,Read Back By and Verified With: TATA CARBONE @ 8:44 AM 10/05/14 BY DWEEKS Performed at [facility]    Report Status 10/07/2014 FINAL  Final   Organism ID, Bacteria GROUP B STREP(S.AGALACTIAE)ISOLATED  Final      Susceptibility   Group b strep(s.agalactiae)isolated - MIC*    AMPICILLIN <=0.25 SENSITIVE Sensitive     PENICILLIN <=0.06 SENSITIVE Sensitive     CLINDAMYCIN <=0.25 SENSITIVE Sensitive      ERYTHROMYCIN 2 RESISTANT Resistant     VANCOMYCIN 0.5 SENSITIVE Sensitive     LEVOFLOXACIN 1 SENSITIVE Sensitive     * GROUP B STREP(S.AGALACTIAE)ISOLATED  Culture, blood (routine x 2)     Status: None (Preliminary result)   Collection Time: 10/04/14  2:13 PM  Result Value Ref Range Status   Specimen Description BLOOD RIGHT FOREARM  Final   Special Requests BOTTLES DRAWN AEROBIC AND ANAEROBIC 10CC  Final   Culture   Final           BLOOD CULTURE RECEIVED NO GROWTH TO DATE CULTURE WILL BE HELD FOR 5 DAYS BEFORE ISSUING A FINAL NEGATIVE REPORT Performed at [facility]    Report Status PENDING  Incomplete  MRSA PCR Screening  Status: None   Collection Time: 10/04/14  8:17 PM  Result Value Ref Range Status   MRSA by PCR NEGATIVE NEGATIVE Final    Comment:        The GeneXpert MRSA Assay (FDA approved for NASAL specimens only), is one component of a comprehensive MRSA colonization surveillance program. It is not intended to diagnose MRSA infection nor to guide or monitor treatment for MRSA infections.   Urine culture     Status: None   Collection Time: 10/05/14  5:43 AM  Result Value Ref Range Status   Specimen Description URINE, RANDOM  Final   Special Requests NONE  Final   Colony Count   Final    2,000 COLONIES/ML Performed at [facility]    Culture   Final    INSIGNIFICANT GROWTH Performed at [facility]    Report Status 10/06/2014 FINAL  Final     Studies:  Recent x-ray studies have been reviewed in detail by the Attending Physician  Scheduled Meds:  Scheduled Meds: . acidophilus  2 capsule Oral Daily  . escitalopram  10 mg Oral Daily  . feeding supplement (ENSURE COMPLETE)  237 mL Oral BID BM  . insulin aspart  0-5 Units Subcutaneous QHS  . insulin aspart  0-9 Units Subcutaneous TID WC  . lactulose  20 g Oral BID  . mirtazapine  15 mg Oral QHS  . sodium chloride  3 mL Intravenous Q12H    Time spent on care of this patient:  35 mins   Thaddeus Evitts , MD   [phone] If 7PM-7AM, please contact night-coverage [email or web address] Password [email or web address] 10/07/2014, 2:28 PM   LOS: 3 days

## 2014-10-07 NOTE — Progress Notes (Signed)
Patient complains of constant abd pain 7/10 "with jabs" that are 8/10. No BM since Friday despite BID Lactulose. ABD distended, +BS in all quadrants. On-call provider made aware. Will await any new orders and continue to monitor.

## 2014-10-08 LAB — CBC
HEMATOCRIT: 27.6 % — AB (ref 39.0–52.0)
Hemoglobin: 9.6 g/dL — ABNORMAL LOW (ref 13.0–17.0)
MCH: 32.5 pg (ref 26.0–34.0)
MCHC: 34.8 g/dL (ref 30.0–36.0)
MCV: 93.6 fL (ref 78.0–100.0)
PLATELETS: 72 10*3/uL — AB (ref 150–400)
RBC: 2.95 MIL/uL — ABNORMAL LOW (ref 4.22–5.81)
RDW: 17.2 % — AB (ref 11.5–15.5)
WBC: 4.7 10*3/uL (ref 4.0–10.5)

## 2014-10-08 LAB — BASIC METABOLIC PANEL
ANION GAP: 7 (ref 5–15)
BUN: 11 mg/dL (ref 6–23)
CHLORIDE: 108 mmol/L (ref 96–112)
CO2: 20 mmol/L (ref 19–32)
CREATININE: 0.83 mg/dL (ref 0.50–1.35)
Calcium: 8.2 mg/dL — ABNORMAL LOW (ref 8.4–10.5)
GFR calc Af Amer: >90 mL/min (ref >90)
GFR calc non Af Amer: >90 mL/min (ref >90)
GLUCOSE: 122 mg/dL — AB (ref 70–99)
Potassium: 4.3 mmol/L (ref 3.5–5.1)
Sodium: 135 mmol/L (ref 135–145)

## 2014-10-08 LAB — GLUCOSE, CAPILLARY
GLUCOSE-CAPILLARY: 191 mg/dL — AB (ref 70–99)
Glucose-Capillary: 152 mg/dL — ABNORMAL HIGH (ref 70–99)
Glucose-Capillary: 209 mg/dL — ABNORMAL HIGH (ref 70–99)
Glucose-Capillary: 185 mg/dL — ABNORMAL HIGH (ref 70–99)

## 2014-10-08 MED ORDER — METRONIDAZOLE 0.75 % EX GEL
1.0000 "application " | Freq: Two times a day (BID) | CUTANEOUS | Status: DC
  Administered 2014-10-08 – 2014-10-09 (×2): 1 via TOPICAL
  Filled 2014-10-08 (×2): qty 45

## 2014-10-08 MED ORDER — MAGNESIUM CITRATE PO SOLN
0.5000 | Freq: Once | ORAL | Status: AC
  Administered 2014-10-08: 0.5 via ORAL
  Filled 2014-10-08: qty 296

## 2014-10-08 NOTE — Progress Notes (Addendum)
Progress Note  Garrett Turner MPN:[id number] DOB: April 08, 1951 DOA: 10/04/2014 PCP: Lorayne Marek, MD  Admit HPI / Brief Narrative: 64 y.o. Male with history of cirrhosis due to [facility], recent admission for pneumonia, ascites which is in good control on diuretics, depression, DM type II, right upper lobe nodule needs a repeat CT scan in the outpatient setting and pulmonary follow-up, and a history of gallstones, who came to the ED in referral from the local free clinic for evaluation of shortness of breath and hypoxia.  In the ER workup was consistent with HCAP + sepsis with metabolic acidosis. Patient noted chills and subjective fevers with severe rigors, as well as shortness of breath for a few days.  HPI/Subjective: Pt is alert and oriented.  He has developed a severe L lateral chest pleuritic pain that has worsened over the course of the day.  He denies sob, n/v, or abdom pain.     Assessment/Plan:  Septic shock secondary to HCAP Baseline SBP reportedly ~100 - BP now stable and better than reported baseline - hold BP meds for now and follow   Acute hypoxic respiratory failure due to HCAP CTa chest notes subpleural reticulation B suggestive of NSIP - outpt high resolution CT chest suggested - pt has been afebrile here, w/ normal WBC, but reported rigors at home - procalcitonin is signif elevated - cont emperic tx for HCAP w/ planned 7 day abx course - flu panel negative so stopped tamiflu  - Transitioned IV vancomycin and cefepime to levofloxacin 3/20 ( Streptococcus sensitive to levofloxacin on blood culture)  L pleuritic chest wall pain Likely simple pulled intercostal muscle, but will obtain f/u CXR to r/o acute issue - good air movement noted in L lung fields  - Incentive spirometry, when necessary pain medicine.  1/2 Blood cx + Group B strep Sensitive to levofloxacin, antibiotics changed to levofloxacin, repeat blood cultures. Repeat blood culture 3/20 no growth to date Discussed  with infectious disease Dr Linus Salmons, will need to treat total of 14 days, and check 2-D echo.  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 - stable - will follow  RUL lung nodule  13 mm RUL nodule seen on CTA January 2016 - not noted on CTa chest this admit - will need high resolution CT chest as outpt as noted above   Depression  Elevated d-Dimer No PE on CTA chest   Code Status: DO NOT INTUBATE  Family Communication: no family present at time of exam Disposition Plan: Pending further workup  Consultants: PCCM  Procedures: none  Antibiotics: Zosyn 3/17 Cefepime 3/17> 3/20 Vanc 3/17> 3/20 Tamiflu 3/17>3/18 Levofloxacin 3/20  DVT prophylaxis: SCDs  Objective: Blood pressure 104/59, pulse 92, temperature 98.4 F (36.9 C), temperature source Oral, resp. rate 20, height 6\' 1"  (1.854 m), weight 75 kg (165 lb 5.5 oz), SpO2 92 %.  Intake/Output Summary (Last 24 hours) at 10/08/14 1631 Last data filed at 10/08/14 1145  Gross per 24 hour  Intake    650 ml  Output   1800 ml  Net  -1150 ml   Exam: General: No acute respiratory distress Lungs:  - no wheeze - good air movement th/o B lung fields  Cardiovascular: Regular rate and rhythm without murmur gallop or rub  Abdomen: Nontender, nondistended, soft, bowel sounds positive, no rebound, no ascites, no appreciable mass Extremities: No significant cyanosis, clubbing, or edema bilateral lower extremities  Data Reviewed: Basic Metabolic Panel:  Recent Labs Lab 10/04/14 1430 10/05/14 0040 10/06/14 0311 10/07/14 0600 10/08/14 0555  NA 133* 134* 133* 133* 135  K 4.3 3.8 3.5 3.9 4.3  CL 105 109 111 108 108  CO2 15* 20 18* 20 20   GLUCOSE 153* 170* 110* 147* 122*  BUN 21 19 15 13 11   CREATININE 1.18 1.05 0.85 0.89 0.83  CALCIUM 8.2* 7.7* 7.6* 7.9* 8.2*    Liver Function Tests:  Recent Labs Lab 10/04/14 1125 10/04/14 1430 10/06/14 0311 10/07/14 0600  AST 66* 54* 55* 65*  ALT 36 33 32 35  ALKPHOS 118* 101 98 115  BILITOT 3.7* 4.1* 2.1* 2.3*  PROT 7.9 7.2 6.0 6.6  ALBUMIN 2.7* 2.4* 1.9* 2.2*    Recent Labs Lab 10/04/14 1430 10/06/14 0311  AMMONIA 48* 45*   Coags:  Recent Labs Lab 10/04/14 1125 10/04/14 1430  INR 1.58* 1.66*    Recent Labs Lab 10/04/14 1125  APTT 38*   CBC:  Recent Labs Lab 10/04/14 1125  10/04/14 2007 10/05/14 0040 10/06/14 0311 10/07/14 0600 10/08/14 0555  WBC 9.7  < > 7.2 6.1 3.9* 6.7 4.7  NEUTROABS 8.3*  --   --   --   --   --   --   HGB 11.0*  < > 8.6* 8.4* 8.9* 9.9* 9.6*  HCT 32.0*  < > 24.6* 24.0* 25.8* 29.4* 27.6*  MCV 92.5  < > 91.1 90.6 90.8 92.5 93.6  PLT 71*  < > 51* 45* 48* 80* 72*  < > = values in this interval not displayed.  Cardiac Enzymes:  Recent Labs Lab 10/04/14 1125 10/04/14 1430 10/04/14 2007 10/05/14 0040  TROPONINI 0.04* <0.03 <0.03 <0.03   CBG:  Recent Labs Lab 10/07/14 1142 10/07/14 1727 10/07/14 2218 10/08/14 0805 10/08/14 1151  GLUCAP 198* 172* 172* 152* 209*    Recent Results (from the past 240 hour(s))  Culture, blood (routine x 2)     Status: None   Collection Time: 10/04/14  2:01 PM  Result Value Ref Range Status   Specimen Description BLOOD ARM RIGHT  Final   Special Requests BOTTLES DRAWN AEROBIC AND ANAEROBIC 10CC  Final   Culture   Final    GROUP B STREP(S.AGALACTIAE)ISOLATED Note: This organism DOES NOT demonstrate inducible Clindamycin resistance in vitro. Note: Gram Stain Report Called to,Read Back By and Verified With: TATA CARBONE @ 8:44 AM 10/05/14 BY DWEEKS Performed at [facility]    Report Status 10/07/2014 FINAL  Final   Organism ID, Bacteria GROUP B STREP(S.AGALACTIAE)ISOLATED   Final      Susceptibility   Group b strep(s.agalactiae)isolated - MIC*    AMPICILLIN <=0.25 SENSITIVE Sensitive     PENICILLIN <=0.06 SENSITIVE Sensitive     CLINDAMYCIN <=0.25 SENSITIVE Sensitive     ERYTHROMYCIN 2 RESISTANT Resistant     VANCOMYCIN 0.5 SENSITIVE Sensitive     LEVOFLOXACIN 1 SENSITIVE Sensitive     * GROUP B STREP(S.AGALACTIAE)ISOLATED  Culture, blood (routine x 2)     Status: None (Preliminary result)   Collection Time: 10/04/14  2:13 PM  Result Value Ref Range Status   Specimen Description BLOOD RIGHT FOREARM  Final   Special Requests BOTTLES DRAWN AEROBIC AND ANAEROBIC 10CC  Final  Culture   Final           BLOOD CULTURE RECEIVED NO GROWTH TO DATE CULTURE WILL BE HELD FOR 5 DAYS BEFORE ISSUING A FINAL NEGATIVE REPORT Performed at [facility]    Report Status PENDING  Incomplete  MRSA PCR Screening     Status: None   Collection Time: 10/04/14  8:17 PM  Result Value Ref Range Status   MRSA by PCR NEGATIVE NEGATIVE Final    Comment:        The GeneXpert MRSA Assay (FDA approved for NASAL specimens only), is one component of a comprehensive MRSA colonization surveillance program. It is not intended to diagnose MRSA infection nor to guide or monitor treatment for MRSA infections.   Urine culture     Status: None   Collection Time: 10/05/14  5:43 AM  Result Value Ref Range Status   Specimen Description URINE, RANDOM  Final   Special Requests NONE  Final   Colony Count   Final    2,000 COLONIES/ML Performed at [facility]    Culture   Final    INSIGNIFICANT GROWTH Performed at [facility]    Report Status 10/06/2014 FINAL  Final         Scheduled Meds:  Scheduled Meds: . acidophilus  2 capsule Oral Daily  . escitalopram  10 mg Oral Daily  . feeding supplement (ENSURE COMPLETE)  237 mL Oral BID BM  . insulin aspart  0-5 Units Subcutaneous QHS  . insulin aspart  0-9 Units Subcutaneous TID WC  . lactulose  20 g Oral  BID  . levofloxacin (LEVAQUIN) IV  750 mg Intravenous Q24H  . mirtazapine  15 mg Oral QHS  . sodium chloride  3 mL Intravenous Q12H    Time spent on care of this patient: 35 mins   Shatima Zalar , MD   [phone] If 7PM-7AM, please contact night-coverage [email or web address] Password [email or web address] 10/08/2014, 4:31 PM   LOS: 4 days

## 2014-10-08 NOTE — Progress Notes (Signed)
Physical Therapy Treatment Patient Details Name: Garrett Turner MRN: [id number] DOB: [birth year] Today's Date: 10/08/2014    History of Present Illness Patient is a 64 yo male admitted 10/04/14 with SOB and hypoxia.  Patient with HCAP and sepsis, and has developed Lt lateral chest pleuritic pain.   PMH:  cirrhosis due to NASH, pneumonia, DM, HTN, anxiety, depression    PT Comments    Pt is progressing well with his mobility.  He continues to have increased DOE during gait, but is able to maintain safe HR and O2 sats in the mid 90s throughout.  He would continue to benefit from home therapy for gait and endurance training after d/c from the acute hospital setting.  PT will attempt to give LE HEP next session and he would benefit from exercises to help lift his ribcage from his pelvis (breathing and upper extremity and upper back).    Follow Up Recommendations  Home health PT;Supervision - Intermittent     Equipment Recommendations  None recommended by PT    Recommendations for Other Services   NA     Precautions / Restrictions Precautions Precautions: Fall Precaution Comments: uses RW at baseline    Mobility  Bed Mobility Overal bed mobility: Independent       Supine to sit: Independent     General bed mobility comments: Pt able to get EOB quickly and easily and without use of railings for support.   Transfers Overall transfer level: Needs assistance Equipment used: Rolling walker (2 wheeled) Transfers: Sit to/from Stand Sit to Stand: Supervision         General transfer comment: supervision for safety, pt using some momentum to get to standing from lower bed surface.  Pt was able to self correct hand placement at second standing attempt and pushed up from the bed instead of having hands on RW.   Ambulation/Gait Ambulation/Gait assistance: Min guard Ambulation Distance (Feet): 250 Feet Assistive device: Rolling walker (2 wheeled) Gait Pattern/deviations:  Step-through pattern;Trunk flexed     General Gait Details: Pt with generally kyphotic gait pattern, he is able to correct posture, but limited endurance to maintain it for > 1 min during gait.  RW adjusted up to fit his height.              Cognition Arousal/Alertness: Awake/alert Behavior During Therapy: WFL for tasks assessed/performed Overall Cognitive Status: Within Functional Limits for tasks assessed                         General Comments General comments (skin integrity, edema, etc.): HR and O2 sats stable on RA during gait with 2/4 DOE level.       Pertinent Vitals/Pain Pain Assessment: No/denies pain           PT Goals (current goals can now be found in the care plan section) Acute Rehab PT Goals Patient Stated Goal: to have my independence Progress towards PT goals: Progressing toward goals    Frequency  Min 3X/week    PT Plan Current plan remains appropriate    Co-evaluation             End of Session   Activity Tolerance: Patient tolerated treatment well Patient left: in bed;with call bell/phone within reach     Time: 1612-1628 PT Time Calculation (min) (ACUTE ONLY): 16 min  Charges:  $Gait Training: 8-22 mins  Barbarann Ehlers [place], [place], DPT [phone]   10/08/2014, 4:46 PM

## 2014-10-08 NOTE — Progress Notes (Signed)
ANTIBIOTIC CONSULT NOTE - FOLLOW UP  Pharmacy Consult for levaquin Indication: HCAP No Known Allergies  Patient Measurements: Height: 6\' 1"  (185.4 cm) Weight: 165 lb 5.5 oz (75 kg) IBW/kg (Calculated) : 79.9  Vital Signs: Temp: 98.1 F (36.7 C) (03/21 0605) Temp Source: Oral (03/21 0605) BP: 112/56 mmHg (03/21 0605) Pulse Rate: 74 (03/21 0605) Intake/Output from previous day: 03/20 0701 - 03/21 0700 In: 2040 [P.O.:840; IV Piggyback:1200] Out: 1050 [Urine:1050] Intake/Output from this shift: Total I/O In: 120 [P.O.:120] Out: -   Labs:  Recent Labs  10/06/14 0311 10/07/14 0600 10/08/14 0555  WBC 3.9* 6.7 4.7  HGB 8.9* 9.9* 9.6*  PLT 48* 80* 72*  CREATININE 0.85 0.89 0.83   Estimated Creatinine Clearance: 94.1 mL/min (by C-G formula based on Cr of 0.83).  Recent Labs  10/07/14 0600  VANCOTROUGH 16.5     Assessment: Levaquin D#2 for HCAP. Pt remains afebrile and WBC is WNL. Scr is stable so dose remains appropriate.   Levaquin 3/20>> Cefepime 3/17>>3/20 vanc 3/17>>3/20 Tamiflu 3/17 >>3/18  3/17 BCx2>> 1/2 Group B strep (S - Amp, clinda, lvq, pen, vanc, R- erythro) 3/18 urine >> insignificant growth 3/17 flu pcr>> negative  Goal of Therapy:  Eradication of infection  Plan:  - Continue levaquin 750mg  IV Q24H - Change to PO as able  *Pharmacy will sign-off as not dose adjustments are anticipated. Will follow peripherally. Thank you for the consult!  Salome Arnt, PharmD, BCPS Pager # [phone] 10/08/2014 10:25 AM

## 2014-10-08 NOTE — Progress Notes (Signed)
Medicare Important Message given?  YES (If response is "NO", the following Medicare IM given date fields will be blank) Date Medicare IM given:  10/08/14 Medicare IM given by:  Clearence Vitug 

## 2014-10-09 ENCOUNTER — Encounter (HOSPITAL_COMMUNITY): Payer: Self-pay | Admitting: *Deleted

## 2014-10-09 DIAGNOSIS — R7881 Bacteremia: Secondary | ICD-10-CM

## 2014-10-09 LAB — GLUCOSE, CAPILLARY
GLUCOSE-CAPILLARY: 136 mg/dL — AB (ref 70–99)
Glucose-Capillary: 105 mg/dL — ABNORMAL HIGH (ref 70–99)
Glucose-Capillary: 185 mg/dL — ABNORMAL HIGH (ref 70–99)

## 2014-10-09 MED ORDER — LACTULOSE 10 GM/15ML PO SOLN: 20.0000 g | Freq: Every day | ORAL | Status: DC

## 2014-10-09 MED ORDER — ENSURE COMPLETE PO LIQD: 237.0000 mL | Freq: Two times a day (BID) | ORAL | Status: DC

## 2014-10-09 MED ORDER — LEVOFLOXACIN 750 MG PO TABS: 750.0000 mg | ORAL_TABLET | Freq: Every day | ORAL | Status: AC

## 2014-10-09 MED ORDER — SPIRONOLACTONE 100 MG PO TABS: 100.0000 mg | ORAL_TABLET | Freq: Every day | ORAL | Status: DC

## 2014-10-09 MED ORDER — FUROSEMIDE 40 MG PO TABS: 40.0000 mg | ORAL_TABLET | Freq: Every day | ORAL | Status: DC

## 2014-10-09 MED ORDER — LEVOFLOXACIN 750 MG PO TABS
750.0000 mg | ORAL_TABLET | Freq: Every day | ORAL | Status: DC
  Administered 2014-10-09: 750 mg via ORAL
  Filled 2014-10-09: qty 1

## 2014-10-09 MED ORDER — RISAQUAD PO CAPS
2.0000 | ORAL_CAPSULE | Freq: Every day | ORAL | Status: AC
Start: 2014-10-09 — End: 2014-10-23

## 2014-10-09 NOTE — Progress Notes (Signed)
OT Cancellation Note  Patient Details Name: Garrett Turner MRN: [id number] DOB: 11-23-1950   Cancelled Treatment:    Reason Eval/Treat Not Completed: Other (comment). Pt states he has been ambulating in hallway by himself and performing grooming at sink by himself so he did not feel need to get up with OT. Pt states he plans to have [place]. Left pt on caseload in case he stays a few more days in hospital.  Benito Mccreedy OTR/L [phone] 10/09/2014, 1:10 PM

## 2014-10-09 NOTE — Discharge Summary (Signed)
Garrett Turner, 64 y.o., DOB 1950-07-21, MRN [id number]. Admission date: 10/04/2014 Discharge Date 10/09/2014 Primary MD Lorayne Marek, MD Admitting Physician Thurnell Lose, MD    PCP follow-up: -  Please check patient's labs including CBC, BMP,  Chest x-ray during next visit,  patient instructed to resume diuresis in 4 days from discharge. - 13 mm RUL nodule seen on CTA January 2016 - not noted on CTa chest this admit - will need high resolution CT chest as  Outpatient. Pulmonary follow-up scheduled on April 12.  Admission Diagnosis  SOB (shortness of breath) [R06.02] HCAP (healthcare-associated pneumonia) [J18.9]  Discharge Diagnosis   Principal Problem:   Sepsis Active Problems:   Hyperlipidemia   GERD (gastroesophageal reflux disease)   Cirrhosis of liver   Thrombocytopenia   Depression with anxiety   CAP (community acquired pneumonia)   HCAP (healthcare-associated pneumonia)   Severe sepsis   Protein-calorie malnutrition, severe      Past Medical History  Diagnosis Date  . Hypertension   . High cholesterol   . Anxiety disorder   . Pancytopenia   . UTI (lower urinary tract infection)   . Depression   . Anxiety   . Leg cramps   . Type II diabetes mellitus   . Liver cirrhosis secondary to NASH   . Infectious hepatitis 1971  . Pneumonia ~ 07/2014; 09/2014    Past Surgical History  Procedure Laterality Date  . Tonsillectomy    . Esophagogastroduodenoscopy  11/04/2011    Procedure: ESOPHAGOGASTRODUODENOSCOPY (EGD);  Surgeon: Arta Silence, MD;  Location: [facility];  Service: Endoscopy;  Laterality: N/A;  . Colonoscopy  11/04/2011    Procedure: COLONOSCOPY;  Surgeon: Arta Silence, MD;  Location: [facility];  Service: Endoscopy;  Laterality: N/A;  . Vasectomy  1985  . Fixation kyphoplasty lumbar spine      "L5"  . Cardiac catheterization  "years ago"  . Inguinal hernia repair Bilateral 06/2013     Hospital Course See H&P, Labs, Consult and Test reports  for all details in brief, patient was admitted for **  Principal Problem:   Sepsis Active Problems:   Hyperlipidemia   GERD (gastroesophageal reflux disease)   Cirrhosis of liver   Thrombocytopenia   Depression with anxiety   CAP (community acquired pneumonia)   HCAP (healthcare-associated pneumonia)   Severe sepsis   Protein-calorie malnutrition, severe  64 y.o. Male with history of cirrhosis due to [facility], recent admission for pneumonia, ascites which is in good control on diuretics, depression, DM type II, right upper lobe nodule needs a repeat CT scan in the outpatient setting and pulmonary follow-up, and a history of gallstones, who came to the ED in referral from the local free clinic for evaluation of shortness of breath and hypoxia.  In the ER workup was consistent with HCAP + sepsis with metabolic acidosis. Patient noted chills and subjective fevers with severe rigors, as well as shortness of breath for a few days. A shunt was admitted initially to step down, with broad-spectrum IV antibiotics, respiratory status much improved, back to room air, blood culture growing Streptococcus group B, sensitive to levofloxacin, patient antibodies were switched to levofloxacin, to finish total of 14 days for treatment.  Septic shock secondary to HCAP  resolved   Acute hypoxic respiratory failure due to HCAP CTA chest notes subpleural reticulation B suggestive of NSIP - outpt high resolution CT chest suggested - pt has been afebrile here, w/ normal WBC  On admission, but reported rigors at home  -  flu panel negative so stopped tamiflu  -  Started on broad-spectrum IV antibiotics, blood cultures growing Streptococcus group B sensitive to levofloxacin soTransitioned IV vancomycin and cefepime to levofloxacin 3/20 ( Streptococcus sensitive to levofloxacin on blood culture),  To finish total of 14 days of antibiotics  Given Streptococcus group B bacteremia,  L pleuritic chest wall pain Likely  simple pulled intercostal muscle,  - good air movement noted in L lung fields  - Incentive spirometry, when necessary pain medicine.   Blood cx + Group B strep Sensitive to levofloxacin, antibiotics changed to levofloxacin,  Repeat blood culture 3/20 no growth to date Discussed with infectious disease Dr Linus Salmons, will need to treat total of 14 days,  To finish another  9 days of oral levofloxacin as an outpatient.  2 D echo showing no evidence of vegetation  Chronic L pleural effusion Not clinically significant at this time - follow  NASH - cirrhosis of the liver  Resume maintenance medications -  Instructed to resume Aldactone and Lasix on 4 days, will be starting  One dose lactulose daily  Hyponatremia Likely due to cirrhosis -  Sodium is 135   DM2  resume home medication  Chronic Thrombocytopenia Due to cirrhosis - stable - will follow  RUL lung nodule  13 mm RUL nodule seen on CTA January 2016 , not noted on CTa chest this admit , will need high resolution CT chest as outpt ,  Pulmonary follow-up in 2 weeks as scheduled on April 12.  Elevated d-Dimer No PE on CTA chest   Consults   PC CM  Significant Tests:  See full reports for all details    Dg Chest 2 View  10/04/2014   CLINICAL DATA:  Shortness of breath, weakness, hypertension, diabetes, cirrhosis  EXAM: CHEST  2 VIEW  COMPARISON:  08/14/2014  FINDINGS: Normal heart size, mediastinal contours and pulmonary vascularity.  Atherosclerotic calcification aorta.  Decreased lung volumes with interstitial infiltrates in the mid to lower lungs bilaterally, peripheral predominance, question infection or edema.  No pleural effusion or pneumothorax.  Bones unremarkable.  IMPRESSION: Interstitial infiltrates bilaterally, peripheral predominance, question infection or edema.   Electronically Signed   By: Lavonia Dana M.D.   On: 10/04/2014 12:08   Ct Angio Chest Pe W/cm &/or Wo Cm  10/04/2014   CLINICAL DATA:  Shortness of breath  since January 2016.  Cirrhosis.  EXAM: CT ANGIOGRAPHY CHEST WITH CONTRAST  TECHNIQUE: Multidetector CT imaging of the chest was performed using the standard protocol during bolus administration of intravenous contrast. Multiplanar CT image reconstructions and MIPs were obtained to evaluate the vascular anatomy.  CONTRAST:  31mL OMNIPAQUE IOHEXOL 350 MG/ML SOLN  COMPARISON:  08/17/2014  FINDINGS: THORACIC INLET/BODY WALL:  Gynecomastia which is symmetric.  MEDIASTINUM:  Prominent left ventricular size. No pericardial effusion. No aortic aneurysm or dissection. There is diffuse atherosclerosis, including the left coronary circulation. No pulmonary arterial filling defect (when accounting for motion at the level of the superior segment right lower lobe). No lymphadenopathy.  LUNG WINDOWS:  Moderate left pleural effusion which is stable from previous. The fluid is layering and simple in density.  There is subpleural reticulation which is similar to 08/17/2014. More nodular areas of ground-glass density noted on previous imaging appeares decreased or resolved. This could have represented acute alveolitis. There is no traction bronchiectasis or honeycombing to suggest fibrosis. No typical pulmonary edema. No consolidating pneumonia.  UPPER ABDOMEN:  Cirrhosis with splenomegaly and ascites. There are large varices  in the epigastrium, neighboring the gastric cardia.  OSSEOUS:  No acute fracture.  No suspicious lytic or blastic lesions.  Review of the MIP images confirms the above findings.  IMPRESSION: 1. No evidence of pulmonary embolism. 2. Chronic, small to moderate left pleural effusion. 3. Persistent subpleural reticulation throughout the bilateral lungs. Persistence raises the possibility of interstitial lung disease (especially NSIP). Given patient's continued/worsening shortness of breath, consider outpatient high-resolution chest CT. 4. Cirrhosis with varices and large ascites.   Electronically Signed   By:  Monte Fantasia M.D.   On: 10/04/2014 16:28   Dg Chest Port 1 View  10/06/2014   CLINICAL DATA:  Pleurisy, hypertension, diabetes and pneumonia  EXAM: PORTABLE CHEST - 1 VIEW  COMPARISON:  CT thorax 10/04/2014  FINDINGS: Normal cardiac silhouette. There are low lung volumes and bibasilar atelectasis. There is a small bilateral pleural effusions. Not changed from prior.  IMPRESSION: Low lung volumes, bibasilar atelectasis and small effusions, no change from comparison CT.   Electronically Signed   By: Suzy Bouchard M.D.   On: 10/06/2014 18:08     Today   Subjective:   Xayvion Shirah today has no headache,no chest abdominal pain,no new weakness tingling or numbness, feels much better wants to go home today.   Objective:   Blood pressure 99/63, pulse 79, temperature 98.8 F (37.1 C), temperature source Oral, resp. rate 18, height 6\' 1"  (1.854 m), weight 77.293 kg (170 lb 6.4 oz), SpO2 95 %.  Intake/Output Summary (Last 24 hours) at 10/09/14 1645 Last data filed at 10/09/14 0900  Gross per 24 hour  Intake    240 ml  Output      0 ml  Net    240 ml    Exam Awake Alert, Oriented *3, No new F.N deficits, Normal affect [place].AT,PERRAL Supple Neck,No JVD, No cervical lymphadenopathy appriciated.  Symmetrical Chest wall movement, Good air movement bilaterally, CTAB RRR,No Gallops,Rubs or new Murmurs, No Parasternal Heave +ve B.Sounds, Abd Soft, Non tender, No organomegaly appriciated, No rebound -guarding or rigidity. No Cyanosis, Clubbing or edema, No new Rash or bruise  Data Review   Cultures - Results for orders placed or performed during the hospital encounter of 10/04/14  Culture, blood (routine x 2)     Status: None   Collection Time: 10/04/14  2:01 PM  Result Value Ref Range Status   Specimen Description BLOOD ARM RIGHT  Final   Special Requests BOTTLES DRAWN AEROBIC AND ANAEROBIC 10CC  Final   Culture   Final    GROUP B STREP(S.AGALACTIAE)ISOLATED Note: This organism  DOES NOT demonstrate inducible Clindamycin resistance in vitro. Note: Gram Stain Report Called to,Read Back By and Verified With: TATA CARBONE @ 8:44 AM 10/05/14 BY DWEEKS Performed at [facility]    Report Status 10/07/2014 FINAL  Final   Organism ID, Bacteria GROUP B STREP(S.AGALACTIAE)ISOLATED  Final      Susceptibility   Group b strep(s.agalactiae)isolated - MIC*    AMPICILLIN <=0.25 SENSITIVE Sensitive     PENICILLIN <=0.06 SENSITIVE Sensitive     CLINDAMYCIN <=0.25 SENSITIVE Sensitive     ERYTHROMYCIN 2 RESISTANT Resistant     VANCOMYCIN 0.5 SENSITIVE Sensitive     LEVOFLOXACIN 1 SENSITIVE Sensitive     * GROUP B STREP(S.AGALACTIAE)ISOLATED  Culture, blood (routine x 2)     Status: None (Preliminary result)   Collection Time: 10/04/14  2:13 PM  Result Value Ref Range Status   Specimen Description BLOOD RIGHT FOREARM  Final  Special Requests BOTTLES DRAWN AEROBIC AND ANAEROBIC 10CC  Final   Culture   Final           BLOOD CULTURE RECEIVED NO GROWTH TO DATE CULTURE WILL BE HELD FOR 5 DAYS BEFORE ISSUING A FINAL NEGATIVE REPORT Performed at [facility]    Report Status PENDING  Incomplete  MRSA PCR Screening     Status: None   Collection Time: 10/04/14  8:17 PM  Result Value Ref Range Status   MRSA by PCR NEGATIVE NEGATIVE Final    Comment:        The GeneXpert MRSA Assay (FDA approved for NASAL specimens only), is one component of a comprehensive MRSA colonization surveillance program. It is not intended to diagnose MRSA infection nor to guide or monitor treatment for MRSA infections.   Urine culture     Status: None   Collection Time: 10/05/14  5:43 AM  Result Value Ref Range Status   Specimen Description URINE, RANDOM  Final   Special Requests NONE  Final   Colony Count   Final    2,000 COLONIES/ML Performed at [facility]    Culture   Final    INSIGNIFICANT GROWTH Performed at [facility]    Report Status 10/06/2014 FINAL   Final  Culture, blood (routine x 2)     Status: None (Preliminary result)   Collection Time: 10/07/14  7:42 PM  Result Value Ref Range Status   Specimen Description BLOOD LEFT ANTECUBITAL  Final   Special Requests BOTTLES DRAWN AEROBIC AND ANAEROBIC 10CC EA  Final   Culture   Final           BLOOD CULTURE RECEIVED NO GROWTH TO DATE CULTURE WILL BE HELD FOR 5 DAYS BEFORE ISSUING A FINAL NEGATIVE REPORT Note: Culture results may be compromised due to an excessive volume of blood received in culture bottles. Performed at [facility]    Report Status PENDING  Incomplete  Culture, blood (routine x 2)     Status: None (Preliminary result)   Collection Time: 10/07/14  7:48 PM  Result Value Ref Range Status   Specimen Description BLOOD LEFT HAND  Final   Special Requests   Final    BOTTLES DRAWN AEROBIC AND ANAEROBIC 10CC BLUE [place] PURPLE   Culture   Final           BLOOD CULTURE RECEIVED NO GROWTH TO DATE CULTURE WILL BE HELD FOR 5 DAYS BEFORE ISSUING A FINAL NEGATIVE REPORT Performed at [facility]    Report Status PENDING  Incomplete     CBC w Diff: Lab Results  Component Value Date   WBC 4.7 10/08/2014   HGB 9.6* 10/08/2014   HCT 27.6* 10/08/2014   PLT 72* 10/08/2014   LYMPHOPCT 7* 10/04/2014   MONOPCT 6 10/04/2014   EOSPCT 1 10/04/2014   BASOPCT 0 10/04/2014   CMP: Lab Results  Component Value Date   NA 135 10/08/2014   K 4.3 10/08/2014   CL 108 10/08/2014   CO2 20 10/08/2014   BUN 11 10/08/2014   CREATININE 0.83 10/08/2014   CREATININE 1.25 02/14/2014   PROT 6.6 10/07/2014   ALBUMIN 2.2* 10/07/2014   BILITOT 2.3* 10/07/2014   ALKPHOS 115 10/07/2014   AST 65* 10/07/2014   ALT 35 10/07/2014  .  Micro Results Recent Results (from the past 240 hour(s))  Culture, blood (routine x 2)     Status: None   Collection Time: 10/04/14  2:01 PM  Result Value Ref Range Status   Specimen Description BLOOD ARM RIGHT  Final   Special Requests BOTTLES DRAWN  AEROBIC AND ANAEROBIC 10CC  Final   Culture   Final    GROUP B STREP(S.AGALACTIAE)ISOLATED Note: This organism DOES NOT demonstrate inducible Clindamycin resistance in vitro. Note: Gram Stain Report Called to,Read Back By and Verified With: TATA CARBONE @ 8:44 AM 10/05/14 BY DWEEKS Performed at [facility]    Report Status 10/07/2014 FINAL  Final   Organism ID, Bacteria GROUP B STREP(S.AGALACTIAE)ISOLATED  Final      Susceptibility   Group b strep(s.agalactiae)isolated - MIC*    AMPICILLIN <=0.25 SENSITIVE Sensitive     PENICILLIN <=0.06 SENSITIVE Sensitive     CLINDAMYCIN <=0.25 SENSITIVE Sensitive     ERYTHROMYCIN 2 RESISTANT Resistant     VANCOMYCIN 0.5 SENSITIVE Sensitive     LEVOFLOXACIN 1 SENSITIVE Sensitive     * GROUP B STREP(S.AGALACTIAE)ISOLATED  Culture, blood (routine x 2)     Status: None (Preliminary result)   Collection Time: 10/04/14  2:13 PM  Result Value Ref Range Status   Specimen Description BLOOD RIGHT FOREARM  Final   Special Requests BOTTLES DRAWN AEROBIC AND ANAEROBIC 10CC  Final   Culture   Final           BLOOD CULTURE RECEIVED NO GROWTH TO DATE CULTURE WILL BE HELD FOR 5 DAYS BEFORE ISSUING A FINAL NEGATIVE REPORT Performed at [facility]    Report Status PENDING  Incomplete  MRSA PCR Screening     Status: None   Collection Time: 10/04/14  8:17 PM  Result Value Ref Range Status   MRSA by PCR NEGATIVE NEGATIVE Final    Comment:        The GeneXpert MRSA Assay (FDA approved for NASAL specimens only), is one component of a comprehensive MRSA colonization surveillance program. It is not intended to diagnose MRSA infection nor to guide or monitor treatment for MRSA infections.   Urine culture     Status: None   Collection Time: 10/05/14  5:43 AM  Result Value Ref Range Status   Specimen Description URINE, RANDOM  Final   Special Requests NONE  Final   Colony Count   Final    2,000 COLONIES/ML Performed at [facility]     Culture   Final    INSIGNIFICANT GROWTH Performed at [facility]    Report Status 10/06/2014 FINAL  Final  Culture, blood (routine x 2)     Status: None (Preliminary result)   Collection Time: 10/07/14  7:42 PM  Result Value Ref Range Status   Specimen Description BLOOD LEFT ANTECUBITAL  Final   Special Requests BOTTLES DRAWN AEROBIC AND ANAEROBIC 10CC EA  Final   Culture   Final           BLOOD CULTURE RECEIVED NO GROWTH TO DATE CULTURE WILL BE HELD FOR 5 DAYS BEFORE ISSUING A FINAL NEGATIVE REPORT Note: Culture results may be compromised due to an excessive volume of blood received in culture bottles. Performed at [facility]    Report Status PENDING  Incomplete  Culture, blood (routine x 2)     Status: None (Preliminary result)   Collection Time: 10/07/14  7:48 PM  Result Value Ref Range Status   Specimen Description BLOOD LEFT HAND  Final   Special Requests   Final    BOTTLES DRAWN AEROBIC AND ANAEROBIC 10CC BLUE [place] PURPLE   Culture  Final           BLOOD CULTURE RECEIVED NO GROWTH TO DATE CULTURE WILL BE HELD FOR 5 DAYS BEFORE ISSUING A FINAL NEGATIVE REPORT Performed at [facility]    Report Status PENDING  Incomplete     Discharge Instructions      Follow-up Information    Follow up with Lorayne Marek, MD In 1 week.   Specialty:  Internal Medicine   Why:  Posthospitalization follow-up for pneumonia   Contact information:   [place] [address] [phone]       Follow up with Christinia Gully, MD On 10/30/2014.   Specialty:  Pulmonary Disease   Why:   follow on April 12 at 10:30 AM,  for  lung nodule follow-up   Contact information:   [address] [address] [phone]       Discharge Medications     Medication List    TAKE these medications        acidophilus Caps capsule  Take 2 capsules by mouth daily.     aspirin 81 MG EC tablet  Take 1 tablet (81 mg total) by mouth daily.      atorvastatin 20 MG tablet  Commonly known as:  LIPITOR  Take 1 tablet (20 mg total) by mouth daily.     diphenhydrAMINE-zinc acetate cream  Commonly known as:  BENADRYL  Apply topically 3 (three) times daily as needed for itching.     escitalopram 20 MG tablet  Commonly known as:  LEXAPRO  Take 1 tablet (20 mg total) by mouth daily.     feeding supplement (ENSURE COMPLETE) Liqd  Take 237 mLs by mouth 2 (two) times daily between meals.     furosemide 40 MG tablet  Commonly known as:  LASIX  Take 1 tablet (40 mg total) by mouth daily.  Start taking on:  10/13/2014     hydrocortisone 2.5 % rectal cream  Commonly known as:  ANUSOL-HC  Apply topically 2 (two) times daily.     lactulose 10 GM/15ML solution  Commonly known as:  CHRONULAC  Take 30 mLs (20 g total) by mouth daily.     levofloxacin 750 MG tablet  Commonly known as:  LEVAQUIN  Take 1 tablet (750 mg total) by mouth daily.  Start taking on:  10/10/2014     metFORMIN 1000 MG tablet  Commonly known as:  GLUCOPHAGE  Take 1,000 mg by mouth 2 (two) times daily with a meal.     mirtazapine 15 MG disintegrating tablet  Commonly known as:  REMERON SOL-TAB  Take 1 tablet (15 mg total) by mouth at bedtime.     propranolol 10 MG tablet  Commonly known as:  INDERAL  Take 1 tablet (10 mg total) by mouth 2 (two) times daily.     spironolactone 100 MG tablet  Commonly known as:  ALDACTONE  Take 1 tablet (100 mg total) by mouth daily.  Start taking on:  10/13/2014     traMADol 50 MG tablet  Commonly known as:  ULTRAM  Take 1 tablet (50 mg total) by mouth every 6 (six) hours as needed for moderate pain.         Total Time in preparing paper work, data evaluation and todays exam - 35 minutes  Taiyana Kissler M.D on 10/09/2014 at 4:45 PM  [place]  [phone]

## 2014-10-09 NOTE — Progress Notes (Signed)
  Echocardiogram 2D Echocardiogram has been performed.  Lysle Rubens 10/09/2014, 11:36 AM

## 2014-10-09 NOTE — Care Management Note (Signed)
    Page 1 of 1   10/09/2014     4:00:16 PM CARE MANAGEMENT NOTE 10/09/2014  Patient:  Garrett Turner, Garrett Turner   Account Number:  [id number]  Date Initiated:  10/09/2014  Documentation initiated by:  Tomi Bamberger  Subjective/Objective Assessment:   dx hcap, bacteremia  admit- lives alone.     Action/Plan:   pt eval- hhpt   Anticipated DC Date:  10/09/2014   Anticipated DC Plan:  [place]  CM consult      [facility] Choice  HOME HEALTH   Choice offered to / List presented to:  C-1 Patient        [place] arranged  HH-1 RN  [place] agency  [place]   Status of service:  Completed, signed off Medicare Important Message given?  YES (If response is "NO", the following Medicare IM given date fields will be blank) Date Medicare IM given:  10/08/2014 Medicare IM given by:  Tomi Bamberger Date Additional Medicare IM given:   Additional Medicare IM given by:    Discharge Disposition:  [place]  Per UR Regulation:  Reviewed for med. necessity/level of care/duration of stay  If discussed at [place] of Stay Meetings, dates discussed:    Comments:  10/09/14 [place], BSN [phone] patient chose [facility] for [facility], [place], aide, Referral made to [facility] with [employer].  Soc will begin 24-48 hrs post dc.

## 2014-10-09 NOTE — Discharge Instructions (Signed)
Follow with Primary MD Lorayne Marek, MD in 7 days   Get CBC, CMP, 2 view Chest X ray checked  by Primary MD next visit.    Activity: As tolerated with Full fall precautions use walker/cane & assistance as needed   Disposition Home    Diet: Heart Healthy  , with feeding assistance and aspiration precautions as needed.  For Heart failure patients - Check your Weight same time everyday, if you gain over 2 pounds, or you develop in leg swelling, experience more shortness of breath or chest pain, call your Primary MD immediately. Follow Cardiac Low Salt Diet and 1.5 lit/day fluid restriction.   On your next visit with your primary care physician please Get Medicines reviewed and adjusted.   Please request your Prim.MD to go over all Hospital Tests and Procedure/Radiological results at the follow up, please get all Hospital records sent to your Prim MD by signing hospital release before you go home.   If you experience worsening of your admission symptoms, develop shortness of breath, life threatening emergency, suicidal or homicidal thoughts you must seek medical attention immediately by calling 911 or calling your MD immediately  if symptoms less severe.  You Must read complete instructions/literature along with all the possible adverse reactions/side effects for all the Medicines you take and that have been prescribed to you. Take any new Medicines after you have completely understood and accpet all the possible adverse reactions/side effects.   Do not drive, operating heavy machinery, perform activities at heights, swimming or participation in water activities or provide baby sitting services if your were admitted for syncope or siezures until you have seen by Primary MD or a Neurologist and advised to do so again.  Do not drive when taking Pain medications.    Do not take more than prescribed Pain, Sleep and Anxiety Medications  Special Instructions: If you have smoked or chewed  Tobacco  in the last 2 yrs please stop smoking, stop any regular Alcohol  and or any Recreational drug use.  Wear Seat belts while driving.   Please note  You were cared for by a hospitalist during your hospital stay. If you have any questions about your discharge medications or the care you received while you were in the hospital after you are discharged, you can call the unit and asked to speak with the hospitalist on call if the hospitalist that took care of you is not available. Once you are discharged, your primary care physician will handle any further medical issues. Please note that NO REFILLS for any discharge medications will be authorized once you are discharged, as it is imperative that you return to your primary care physician (or establish a relationship with a primary care physician if you do not have one) for your aftercare needs so that they can reassess your need for medications and monitor your lab values.

## 2014-10-09 NOTE — Progress Notes (Signed)
Garrett Turner discharged Home per MD order.  Discharge instructions reviewed and discussed with the patient, all questions and concerns answered. Copy of instructions, care notes and scripts given to patient, also faxed prescriptions to [facility] for pt.    Medication List    TAKE these medications        acidophilus Caps capsule  Take 2 capsules by mouth daily.     aspirin 81 MG EC tablet  Take 1 tablet (81 mg total) by mouth daily.     atorvastatin 20 MG tablet  Commonly known as:  LIPITOR  Take 1 tablet (20 mg total) by mouth daily.     diphenhydrAMINE-zinc acetate cream  Commonly known as:  BENADRYL  Apply topically 3 (three) times daily as needed for itching.     escitalopram 20 MG tablet  Commonly known as:  LEXAPRO  Take 1 tablet (20 mg total) by mouth daily.     feeding supplement (ENSURE COMPLETE) Liqd  Take 237 mLs by mouth 2 (two) times daily between meals.     furosemide 40 MG tablet  Commonly known as:  LASIX  Take 1 tablet (40 mg total) by mouth daily.  Start taking on:  10/13/2014     hydrocortisone 2.5 % rectal cream  Commonly known as:  ANUSOL-HC  Apply topically 2 (two) times daily.     lactulose 10 GM/15ML solution  Commonly known as:  CHRONULAC  Take 30 mLs (20 g total) by mouth daily.     levofloxacin 750 MG tablet  Commonly known as:  LEVAQUIN  Take 1 tablet (750 mg total) by mouth daily.  Start taking on:  10/10/2014     metFORMIN 1000 MG tablet  Commonly known as:  GLUCOPHAGE  Take 1,000 mg by mouth 2 (two) times daily with a meal.     mirtazapine 15 MG disintegrating tablet  Commonly known as:  REMERON SOL-TAB  Take 1 tablet (15 mg total) by mouth at bedtime.     propranolol 10 MG tablet  Commonly known as:  INDERAL  Take 1 tablet (10 mg total) by mouth 2 (two) times daily.     spironolactone 100 MG tablet  Commonly known as:  ALDACTONE  Take 1 tablet (100 mg total) by mouth daily.  Start taking on:  10/13/2014     traMADol 50 MG  tablet  Commonly known as:  ULTRAM  Take 1 tablet (50 mg total) by mouth every 6 (six) hours as needed for moderate pain.        Patients skin is clean, dry and intact, no evidence of skin break down. IV site discontinued and catheter remains intact. Site without signs and symptoms of complications. Dressing and pressure applied.  Patient escorted to car by NT in a wheelchair,  no distress noted upon discharge.  [name], Garrett Turner 10/09/2014 6:26 PM

## 2014-10-10 LAB — CULTURE, BLOOD (ROUTINE X 2): Culture: NO GROWTH

## 2014-10-14 LAB — CULTURE, BLOOD (ROUTINE X 2)
CULTURE: NO GROWTH
Culture: NO GROWTH

## 2014-10-22 ENCOUNTER — Encounter: Payer: Self-pay | Admitting: Internal Medicine

## 2014-10-22 ENCOUNTER — Ambulatory Visit: Payer: Medicare Other | Attending: Internal Medicine | Admitting: Internal Medicine

## 2014-10-22 VITALS — BP 111/72 | HR 64 | Temp 97.5°F | Resp 15 | Wt 178.2 lb

## 2014-10-22 DIAGNOSIS — E785 Hyperlipidemia, unspecified: Secondary | ICD-10-CM | POA: Diagnosis not present

## 2014-10-22 DIAGNOSIS — E139 Other specified diabetes mellitus without complications: Secondary | ICD-10-CM

## 2014-10-22 DIAGNOSIS — Z862 Personal history of diseases of the blood and blood-forming organs and certain disorders involving the immune mechanism: Secondary | ICD-10-CM

## 2014-10-22 DIAGNOSIS — J189 Pneumonia, unspecified organism: Secondary | ICD-10-CM | POA: Insufficient documentation

## 2014-10-22 DIAGNOSIS — K7460 Unspecified cirrhosis of liver: Secondary | ICD-10-CM | POA: Diagnosis not present

## 2014-10-22 DIAGNOSIS — F329 Major depressive disorder, single episode, unspecified: Secondary | ICD-10-CM | POA: Diagnosis not present

## 2014-10-22 DIAGNOSIS — Y95 Nosocomial condition: Secondary | ICD-10-CM | POA: Diagnosis not present

## 2014-10-22 DIAGNOSIS — I10 Essential (primary) hypertension: Secondary | ICD-10-CM | POA: Insufficient documentation

## 2014-10-22 DIAGNOSIS — R188 Other ascites: Secondary | ICD-10-CM | POA: Diagnosis not present

## 2014-10-22 DIAGNOSIS — R911 Solitary pulmonary nodule: Secondary | ICD-10-CM | POA: Diagnosis not present

## 2014-10-22 DIAGNOSIS — E119 Type 2 diabetes mellitus without complications: Secondary | ICD-10-CM | POA: Diagnosis not present

## 2014-10-22 DIAGNOSIS — Z7982 Long term (current) use of aspirin: Secondary | ICD-10-CM | POA: Diagnosis not present

## 2014-10-22 DIAGNOSIS — D649 Anemia, unspecified: Secondary | ICD-10-CM | POA: Diagnosis not present

## 2014-10-22 DIAGNOSIS — I639 Cerebral infarction, unspecified: Secondary | ICD-10-CM

## 2014-10-22 DIAGNOSIS — F419 Anxiety disorder, unspecified: Secondary | ICD-10-CM | POA: Insufficient documentation

## 2014-10-22 DIAGNOSIS — K7581 Nonalcoholic steatohepatitis (NASH): Secondary | ICD-10-CM | POA: Insufficient documentation

## 2014-10-22 LAB — GLUCOSE, POCT (MANUAL RESULT ENTRY): POC Glucose: 137 mg/dl — AB (ref 70–99)

## 2014-10-22 LAB — COMPLETE METABOLIC PANEL WITH GFR
ALBUMIN: 2.5 g/dL — AB (ref 3.5–5.2)
ALT: 21 U/L (ref 0–53)
AST: 48 U/L — AB (ref 0–37)
Alkaline Phosphatase: 104 U/L (ref 39–117)
BUN: 15 mg/dL (ref 6–23)
CALCIUM: 8.6 mg/dL (ref 8.4–10.5)
CO2: 22 mEq/L (ref 19–32)
Chloride: 108 mEq/L (ref 96–112)
Creat: 0.9 mg/dL (ref 0.50–1.35)
GFR, Est Non African American: 89 mL/min
GLUCOSE: 146 mg/dL — AB (ref 70–99)
POTASSIUM: 5.7 meq/L — AB (ref 3.5–5.3)
SODIUM: 139 meq/L (ref 135–145)
Total Bilirubin: 2.5 mg/dL — ABNORMAL HIGH (ref 0.2–1.2)
Total Protein: 6.9 g/dL (ref 6.0–8.3)

## 2014-10-22 MED ORDER — ATORVASTATIN CALCIUM 20 MG PO TABS: 20.0000 mg | ORAL_TABLET | Freq: Every day | ORAL | Status: DC

## 2014-10-22 MED ORDER — SPIRONOLACTONE 100 MG PO TABS: 100.0000 mg | ORAL_TABLET | Freq: Every day | ORAL | Status: DC

## 2014-10-22 MED ORDER — PROPRANOLOL HCL 10 MG PO TABS: 10.0000 mg | ORAL_TABLET | Freq: Two times a day (BID) | ORAL | Status: DC

## 2014-10-22 MED ORDER — FUROSEMIDE 40 MG PO TABS: 40.0000 mg | ORAL_TABLET | Freq: Every day | ORAL | Status: DC

## 2014-10-22 NOTE — Patient Instructions (Signed)
DASH Eating Plan DASH stands for "Dietary Approaches to Stop Hypertension." The DASH eating plan is a healthy eating plan that has been shown to reduce high blood pressure (hypertension). Additional health benefits may include reducing the risk of type 2 diabetes mellitus, heart disease, and stroke. The DASH eating plan may also help with weight loss. WHAT DO I NEED TO KNOW ABOUT THE DASH EATING PLAN? For the DASH eating plan, you will follow these general guidelines:  Choose foods with a percent daily value for sodium of less than 5% (as listed on the food label).  Use salt-free seasonings or herbs instead of table salt or sea salt.  Check with your health care provider or pharmacist before using salt substitutes.  Eat lower-sodium products, often labeled as "lower sodium" or "no salt added."  Eat fresh foods.  Eat more vegetables, fruits, and low-fat dairy products.  Choose whole grains. Look for the word "whole" as the first word in the ingredient list.  Choose fish and skinless chicken or turkey more often than red meat. Limit fish, poultry, and meat to 6 oz (170 g) each day.  Limit sweets, desserts, sugars, and sugary drinks.  Choose heart-healthy fats.  Limit cheese to 1 oz (28 g) per day.  Eat more home-cooked food and less restaurant, buffet, and fast food.  Limit fried foods.  Cook foods using methods other than frying.  Limit canned vegetables. If you do use them, rinse them well to decrease the sodium.  When eating at a restaurant, ask that your food be prepared with less salt, or no salt if possible. WHAT FOODS CAN I EAT? Seek help from a dietitian for individual calorie needs. Grains Whole grain or whole wheat bread. Brown rice. Whole grain or whole wheat pasta. Quinoa, bulgur, and whole grain cereals. Low-sodium cereals. Corn or whole wheat flour tortillas. Whole grain cornbread. Whole grain crackers. Low-sodium crackers. Vegetables Fresh or frozen vegetables  (raw, steamed, roasted, or grilled). Low-sodium or reduced-sodium tomato and vegetable juices. Low-sodium or reduced-sodium tomato sauce and paste. Low-sodium or reduced-sodium canned vegetables.  Fruits All fresh, canned (in natural juice), or frozen fruits. Meat and Other Protein Products Ground beef (85% or leaner), grass-fed beef, or beef trimmed of fat. Skinless chicken or turkey. Ground chicken or turkey. Pork trimmed of fat. All fish and seafood. Eggs. Dried beans, peas, or lentils. Unsalted nuts and seeds. Unsalted canned beans. Dairy Low-fat dairy products, such as skim or 1% milk, 2% or reduced-fat cheeses, low-fat ricotta or cottage cheese, or plain low-fat yogurt. Low-sodium or reduced-sodium cheeses. Fats and Oils Tub margarines without trans fats. Light or reduced-fat mayonnaise and salad dressings (reduced sodium). Avocado. Safflower, olive, or canola oils. Natural peanut or almond butter. Other Unsalted popcorn and pretzels. The items listed above may not be a complete list of recommended foods or beverages. Contact your dietitian for more options. WHAT FOODS ARE NOT RECOMMENDED? Grains White bread. White pasta. White rice. Refined cornbread. Bagels and croissants. Crackers that contain trans fat. Vegetables Creamed or fried vegetables. Vegetables in a cheese sauce. Regular canned vegetables. Regular canned tomato sauce and paste. Regular tomato and vegetable juices. Fruits Dried fruits. Canned fruit in light or heavy syrup. Fruit juice. Meat and Other Protein Products Fatty cuts of meat. Ribs, chicken wings, bacon, sausage, bologna, salami, chitterlings, fatback, hot dogs, bratwurst, and packaged luncheon meats. Salted nuts and seeds. Canned beans with salt. Dairy Whole or 2% milk, cream, half-and-half, and cream cheese. Whole-fat or sweetened yogurt. Full-fat   cheeses or blue cheese. Nondairy creamers and whipped toppings. Processed cheese, cheese spreads, or cheese  curds. Condiments Onion and garlic salt, seasoned salt, table salt, and sea salt. Canned and packaged gravies. Worcestershire sauce. Tartar sauce. Barbecue sauce. Teriyaki sauce. Soy sauce, including reduced sodium. Steak sauce. Fish sauce. Oyster sauce. Cocktail sauce. Horseradish. Ketchup and mustard. Meat flavorings and tenderizers. Bouillon cubes. Hot sauce. Tabasco sauce. Marinades. Taco seasonings. Relishes. Fats and Oils Butter, stick margarine, lard, shortening, ghee, and bacon fat. Coconut, palm kernel, or palm oils. Regular salad dressings. Other Pickles and olives. Salted popcorn and pretzels. The items listed above may not be a complete list of foods and beverages to avoid. Contact your dietitian for more information. WHERE CAN I FIND MORE INFORMATION? National Heart, Lung, and Blood Institute: www.nhlbi.nih.gov/health/health-topics/topics/dash/ Document Released: 06/25/2011 Document Revised: 11/20/2013 Document Reviewed: 05/10/2013 ExitCare Patient Information 2015 [employer]. This information is not intended to replace advice given to you by your health care provider. Make sure you discuss any questions you have with your health care provider. Diabetes Mellitus and Food It is important for you to manage your blood sugar (glucose) level. Your blood glucose level can be greatly affected by what you eat. Eating healthier foods in the appropriate amounts throughout the day at about the same time each day will help you control your blood glucose level. It can also help slow or prevent worsening of your diabetes mellitus. Healthy eating may even help you improve the level of your blood pressure and reach or maintain a healthy weight.  HOW CAN FOOD AFFECT ME? Carbohydrates Carbohydrates affect your blood glucose level more than any other type of food. Your dietitian will help you determine how many carbohydrates to eat at each meal and teach you how to count carbohydrates. Counting  carbohydrates is important to keep your blood glucose at a healthy level, especially if you are using insulin or taking certain medicines for diabetes mellitus. Alcohol Alcohol can cause sudden decreases in blood glucose (hypoglycemia), especially if you use insulin or take certain medicines for diabetes mellitus. Hypoglycemia can be a life-threatening condition. Symptoms of hypoglycemia (sleepiness, dizziness, and disorientation) are similar to symptoms of having too much alcohol.  If your health care provider has given you approval to drink alcohol, do so in moderation and use the following guidelines:  Women should not have more than one drink per day, and men should not have more than two drinks per day. One drink is equal to:  12 oz of beer.  5 oz of wine.  1 oz of hard liquor.  Do not drink on an empty stomach.  Keep yourself hydrated. Have water, diet soda, or unsweetened iced tea.  Regular soda, juice, and other mixers might contain a lot of carbohydrates and should be counted. WHAT FOODS ARE NOT RECOMMENDED? As you make food choices, it is important to remember that all foods are not the same. Some foods have fewer nutrients per serving than other foods, even though they might have the same number of calories or carbohydrates. It is difficult to get your body what it needs when you eat foods with fewer nutrients. Examples of foods that you should avoid that are high in calories and carbohydrates but low in nutrients include:  Trans fats (most processed foods list trans fats on the Nutrition Facts label).  Regular soda.  Juice.  Candy.  Sweets, such as cake, pie, doughnuts, and cookies.  Fried foods. WHAT FOODS CAN I EAT? Have nutrient-rich foods,   which will nourish your body and keep you healthy. The food you should eat also will depend on several factors, including:  The calories you need.  The medicines you take.  Your weight.  Your blood glucose level.  Your  blood pressure level.  Your cholesterol level. You also should eat a variety of foods, including:  Protein, such as meat, poultry, fish, tofu, nuts, and seeds (lean animal proteins are best).  Fruits.  Vegetables.  Dairy products, such as milk, cheese, and yogurt (low fat is best).  Breads, grains, pasta, cereal, rice, and beans.  Fats such as olive oil, trans fat-free margarine, canola oil, avocado, and olives. DOES EVERYONE WITH DIABETES MELLITUS HAVE THE SAME MEAL PLAN? Because every person with diabetes mellitus is different, there is not one meal plan that works for everyone. It is very important that you meet with a dietitian who will help you create a meal plan that is just right for you. Document Released: 04/02/2005 Document Revised: 07/11/2013 Document Reviewed: 06/02/2013 ExitCare Patient Information 2015 [employer]. This information is not intended to replace advice given to you by your health care provider. Make sure you discuss any questions you have with your health care provider.  

## 2014-10-22 NOTE — Progress Notes (Signed)
Patient here for follow up from hospital  Was admitted with bacterial pneumonia  Complains of his feet swelling the past couple of days And his abd is distended

## 2014-10-22 NOTE — Progress Notes (Signed)
MRN: [id number] Name: Garrett Turner  Sex: male Age: 64 y.o. DOB: 1951-01-05  Allergies: Review of patient's allergies indicates no known allergies.  Chief Complaint  Patient presents with  . Hospitalization Follow-up    HPI: Patient is 64 y.o. male who has history of hypertension, anxiety/depression, diabetes, liver cirrhosis secondary to [facility], recently hospitalized with hypoxia, respiratory failure due to pneumonia, EMR reviewed, patient was treated with broad-spectrum IV antibiotic, blood cultures believe Streptococcus group B sensitive to levofloxacin, he was discharged on this antibiotic to finish the course of 14 days, patient also has history of chronic left pleural effusion which was improved, patient also had reported right upper lung nodule with CT in January, he is scheduled to follow with pulmonary probably need a high-resolution CT, patient also has history of lower extremity edema, he already had approximately ultrasound done which was negative for DVT patient is on diuretics. And is requesting refill on his medications, patient also history of anemia as per patient he had a colonoscopy done several years ago, currently denies any bleeding per rectum, following up with gastroenterologist as well as he is a scheduled to follow with pulmonologist  Past Medical History  Diagnosis Date  . Hypertension   . High cholesterol   . Anxiety disorder   . Pancytopenia   . UTI (lower urinary tract infection)   . Depression   . Anxiety   . Leg cramps   . Type II diabetes mellitus   . Liver cirrhosis secondary to NASH   . Infectious hepatitis 1971  . Pneumonia ~ 07/2014; 09/2014    Past Surgical History  Procedure Laterality Date  . Tonsillectomy    . Esophagogastroduodenoscopy  11/04/2011    Procedure: ESOPHAGOGASTRODUODENOSCOPY (EGD);  Surgeon: Arta Silence, MD;  Location: [facility];  Service: Endoscopy;  Laterality: N/A;  . Colonoscopy  11/04/2011    Procedure:  COLONOSCOPY;  Surgeon: Arta Silence, MD;  Location: [facility];  Service: Endoscopy;  Laterality: N/A;  . Vasectomy  1985  . Fixation kyphoplasty lumbar spine      "L5"  . Cardiac catheterization  "years ago"  . Inguinal hernia repair Bilateral 06/2013      Medication List       This list is accurate as of: 10/22/14  1:10 PM.  Always use your most recent med list.               acidophilus Caps capsule  Take 2 capsules by mouth daily.     aspirin 81 MG EC tablet  Take 1 tablet (81 mg total) by mouth daily.     atorvastatin 20 MG tablet  Commonly known as:  LIPITOR  Take 1 tablet (20 mg total) by mouth daily.     diphenhydrAMINE-zinc acetate cream  Commonly known as:  BENADRYL  Apply topically 3 (three) times daily as needed for itching.     escitalopram 20 MG tablet  Commonly known as:  LEXAPRO  Take 1 tablet (20 mg total) by mouth daily.     feeding supplement (ENSURE COMPLETE) Liqd  Take 237 mLs by mouth 2 (two) times daily between meals.     furosemide 40 MG tablet  Commonly known as:  LASIX  Take 1 tablet (40 mg total) by mouth daily.     hydrocortisone 2.5 % rectal cream  Commonly known as:  ANUSOL-HC  Apply topically 2 (two) times daily.     lactulose 10 GM/15ML solution  Commonly known as:  CHRONULAC  Take 30  mLs (20 g total) by mouth daily.     metFORMIN 1000 MG tablet  Commonly known as:  GLUCOPHAGE  Take 1,000 mg by mouth 2 (two) times daily with a meal.     mirtazapine 15 MG disintegrating tablet  Commonly known as:  REMERON SOL-TAB  Take 1 tablet (15 mg total) by mouth at bedtime.     propranolol 10 MG tablet  Commonly known as:  INDERAL  Take 1 tablet (10 mg total) by mouth 2 (two) times daily.     spironolactone 100 MG tablet  Commonly known as:  ALDACTONE  Take 1 tablet (100 mg total) by mouth daily.     traMADol 50 MG tablet  Commonly known as:  ULTRAM  Take 1 tablet (50 mg total) by mouth every 6 (six) hours as needed for  moderate pain.        Meds ordered this encounter  Medications  . spironolactone (ALDACTONE) 100 MG tablet    Sig: Take 1 tablet (100 mg total) by mouth daily.    Dispense:  30 tablet    Refill:  3  . atorvastatin (LIPITOR) 20 MG tablet    Sig: Take 1 tablet (20 mg total) by mouth daily.    Dispense:  30 tablet    Refill:  3  . furosemide (LASIX) 40 MG tablet    Sig: Take 1 tablet (40 mg total) by mouth daily.    Dispense:  30 tablet    Refill:  3  . propranolol (INDERAL) 10 MG tablet    Sig: Take 1 tablet (10 mg total) by mouth 2 (two) times daily.    Dispense:  180 tablet    Refill:  3    Immunization History  Administered Date(s) Administered  . Influenza Split 04/14/2012, 04/24/2013  . Influenza,inj,Quad PF,36+ Mos 06/19/2014  . Pneumococcal Polysaccharide-23 04/14/2012, 04/24/2013  . Td 04/04/2012    Family History  Problem Relation Age of Onset  . Coronary artery disease    . Hypertension    . Heart failure Father   . Hypertension Father   . Heart disease Father   . Heart failure Mother   . Hypertension Mother   . Heart disease Mother   . Stroke Paternal Grandmother   . Obesity Daughter     History  Substance Use Topics  . Smoking status: Passive Smoke Exposure - Never Smoker    Types: Cigarettes  . Smokeless tobacco: Former Systems developer    Types: Chew     Comment: "used to chew tobacco when I played baseball"  . Alcohol Use: 0.6 oz/week    1 Standard drinks or equivalent per week     Comment: 10/04/2014 "haven't drank anything at all for 6 months because of the cirrhosis; before that a couple drinks/month"    Review of Systems   As noted in HPI  Filed Vitals:   10/22/14 1107  BP: 111/72  Pulse: 64  Temp: 97.5 F (36.4 C)  Resp: 15    Physical Exam  Physical Exam  Constitutional: No distress.  Neck: Neck supple.  Cardiovascular: Normal rate and regular rhythm.   Pulmonary/Chest: Breath sounds normal. No respiratory distress. He has no  wheezes. He has no rales.  Abdominal:  Abdomen is slightly distended, no rebound or guarding bowel sounds positive  Musculoskeletal:  1+ ankle edema    CBC    Component Value Date/Time   WBC 4.7 10/08/2014 0555   RBC 2.95* 10/08/2014 0555   RBC 3.07* 02/14/2014  1003   HGB 9.6* 10/08/2014 0555   HCT 27.6* 10/08/2014 0555   PLT 72* 10/08/2014 0555   MCV 93.6 10/08/2014 0555   LYMPHSABS 0.7 10/04/2014 1125   MONOABS 0.6 10/04/2014 1125   EOSABS 0.1 10/04/2014 1125   BASOSABS 0.0 10/04/2014 1125    CMP     Component Value Date/Time   NA 135 10/08/2014 0555   K 4.3 10/08/2014 0555   CL 108 10/08/2014 0555   CO2 20 10/08/2014 0555   GLUCOSE 122* 10/08/2014 0555   BUN 11 10/08/2014 0555   CREATININE 0.83 10/08/2014 0555   CREATININE 1.25 02/14/2014 1003   CALCIUM 8.2* 10/08/2014 0555   PROT 6.6 10/07/2014 0600   ALBUMIN 2.2* 10/07/2014 0600   AST 65* 10/07/2014 0600   ALT 35 10/07/2014 0600   ALKPHOS 115 10/07/2014 0600   BILITOT 2.3* 10/07/2014 0600   GFRNONAA >90 10/08/2014 0555   GFRNONAA 60 02/14/2014 1003   GFRAA >90 10/08/2014 0555   GFRAA 69 02/14/2014 1003    Lab Results  Component Value Date/Time   CHOL 199 08/15/2014 06:33 AM    No components found for: HGA1C  Lab Results  Component Value Date/Time   AST 65* 10/07/2014 06:00 AM    Assessment and Plan  Other specified diabetes mellitus without complications - Plan:  Results for orders placed or performed in visit on 10/22/14  Glucose (CBG)  Result Value Ref Range   POC Glucose 137 (A) 70 - 99 mg/dl   Recent hemoglobin A1c was 6.6%, I have advised patient to continue with diet, continue with metformin, keep the fingerstick log, will check A1c in 3 months.  Essential hypertension - Plan:blood pressure is well controlled, continue with current meds   HCAP (healthcare-associated pneumonia) Patient has already been treated for pneumonia, scheduled to follow with pulmonologist and for possible  repeat imaging study since he had lung nodule Reported on imaging done in January.  Cirrhosis of liver with ascites, unspecified hepatic cirrhosis type - Plan: spironolactone (ALDACTONE) 100 MG tablet, furosemide (LASIX) 40 MG tablet, propranolol (INDERAL) 10 MG tablet, patient to follow with his gastroenterologist.  Hyperlipidemia - Plan: continue with LIPITOR) 20 MG tablet  History of anemia - Plan:will repeat CBC, also check nemia panel, patient is already scheduled to follow with the GI.    Return in about 3 months (around 01/21/2015), or if symptoms worsen or fail to improve.   This note has been created with Surveyor, quantity. Any transcriptional errors are unintentional.    Lorayne Marek, MD

## 2014-10-23 LAB — ANEMIA PANEL
%SAT: 40 % (ref 20–55)
ABS Retic: 49.7 10*3/uL (ref 19.0–186.0)
FERRITIN: 53 ng/mL (ref 22–322)
Folate: 11.8 ng/mL
Iron: 112 ug/dL (ref 42–165)
RBC.: 3.31 MIL/uL — ABNORMAL LOW (ref 4.22–5.81)
RETIC CT PCT: 1.5 % (ref 0.4–2.3)
TIBC: 278 ug/dL (ref 215–435)
UIBC: 166 ug/dL (ref 125–400)
VITAMIN B 12: 1148 pg/mL — AB (ref 211–911)

## 2014-10-23 LAB — CBC WITH DIFFERENTIAL/PLATELET
BASOS PCT: 1 % (ref 0–1)
Basophils Absolute: 0.1 10*3/uL (ref 0.0–0.1)
Eosinophils Absolute: 0.4 10*3/uL (ref 0.0–0.7)
Eosinophils Relative: 7 % — ABNORMAL HIGH (ref 0–5)
HEMATOCRIT: 30.7 % — AB (ref 39.0–52.0)
Hemoglobin: 10.5 g/dL — ABNORMAL LOW (ref 13.0–17.0)
Lymphocytes Relative: 19 % (ref 12–46)
Lymphs Abs: 1.2 10*3/uL (ref 0.7–4.0)
MCH: 31.7 pg (ref 26.0–34.0)
MCHC: 34.2 g/dL (ref 30.0–36.0)
MCV: 92.7 fL (ref 78.0–100.0)
MONO ABS: 0.9 10*3/uL (ref 0.1–1.0)
MONOS PCT: 14 % — AB (ref 3–12)
MPV: 9 fL (ref 8.6–12.4)
Neutro Abs: 3.6 10*3/uL (ref 1.7–7.7)
Neutrophils Relative %: 59 % (ref 43–77)
Platelets: 69 10*3/uL — ABNORMAL LOW (ref 150–400)
RBC: 3.31 MIL/uL — ABNORMAL LOW (ref 4.22–5.81)
RDW: 17.2 % — AB (ref 11.5–15.5)
WBC: 6.1 10*3/uL (ref 4.0–10.5)

## 2014-10-24 ENCOUNTER — Telehealth: Payer: Self-pay

## 2014-10-24 ENCOUNTER — Other Ambulatory Visit: Payer: Self-pay | Admitting: Gastroenterology

## 2014-10-24 DIAGNOSIS — Z139 Encounter for screening, unspecified: Secondary | ICD-10-CM

## 2014-10-24 NOTE — Telephone Encounter (Signed)
-----   Message from Lorayne Marek, MD sent at 10/23/2014  9:29 AM EDT ----- Blood work reviewed, noticed borderline elevated potassium, advise patient to avoid high potassium containing foods.Will repeat blood chemistry on the following visit. Also patient has chronic anemia which is stable and hemoglobin is improved, continue to follow with GI.

## 2014-10-24 NOTE — Telephone Encounter (Signed)
Patient not available Left message on voice mail to return our call 

## 2014-10-25 ENCOUNTER — Telehealth: Payer: Self-pay

## 2014-10-25 NOTE — Telephone Encounter (Signed)
Patient returned phone  And is aware of his lab results

## 2014-10-30 ENCOUNTER — Inpatient Hospital Stay: Payer: Self-pay | Admitting: Internal Medicine

## 2014-11-01 ENCOUNTER — Other Ambulatory Visit: Payer: Self-pay

## 2014-11-07 ENCOUNTER — Telehealth: Payer: Self-pay | Admitting: Internal Medicine

## 2014-11-07 NOTE — Telephone Encounter (Signed)
Received call from Ebony Hail at [facility], she says that Patient scheduled to see Dr. Melvyn Novas on Friday, 11/09/2014 at 10:15am.  Sats dropping to 82% - 84% just walking to the kitchen, stays low for about 5-10 minutes before going back in the low 90's.  At rest, he stays at 95% - 96%.    FYI to Dr. Melvyn Novas

## 2014-11-08 ENCOUNTER — Ambulatory Visit
Admission: RE | Admit: 2014-11-08 | Discharge: 2014-11-08 | Disposition: A | Discharge status: Home / Self Care | Payer: Medicare Other | Source: Ambulatory Visit | Attending: Gastroenterology | Admitting: Gastroenterology

## 2014-11-08 ENCOUNTER — Other Ambulatory Visit: Payer: Self-pay | Admitting: Gastroenterology

## 2014-11-08 DIAGNOSIS — Z139 Encounter for screening, unspecified: Secondary | ICD-10-CM

## 2014-11-08 DIAGNOSIS — R0602 Shortness of breath: Secondary | ICD-10-CM

## 2014-11-09 ENCOUNTER — Ambulatory Visit (INDEPENDENT_AMBULATORY_CARE_PROVIDER_SITE_OTHER): Payer: Medicare Other | Admitting: Internal Medicine

## 2014-11-09 ENCOUNTER — Encounter: Payer: Self-pay | Admitting: Internal Medicine

## 2014-11-09 VITALS — BP 100/64 | HR 65 | Ht 73.0 in | Wt 168.6 lb

## 2014-11-09 DIAGNOSIS — J189 Pneumonia, unspecified organism: Secondary | ICD-10-CM

## 2014-11-09 DIAGNOSIS — R0600 Dyspnea, unspecified: Secondary | ICD-10-CM

## 2014-11-09 DIAGNOSIS — I639 Cerebral infarction, unspecified: Secondary | ICD-10-CM | POA: Diagnosis not present

## 2014-11-09 NOTE — Progress Notes (Signed)
Subjective:     Patient ID: Garrett Turner, male   DOB: 11-20-1950, 64 y.o.   MRN: [id number]  HPI  Admission date: 10/04/2014 Discharge Date 10/09/2014     PCP follow-up: - Please check patient's labs including CBC, BMP, Chest x-ray during next visit, patient instructed to resume diuresis in 4 days from discharge. - 13 mm RUL nodule seen on CTA January 2016 - not noted on CTa chest this admit - will need high resolution CT chest as Outpatient. Pulmonary follow-up scheduled on April 12.  Admission Diagnosis SOB (shortness of breath) [R06.02] HCAP (healthcare-associated pneumonia) [J18.9]  Discharge Diagnosis Principal Problem:  Sepsis  Hyperlipidemia  GERD (gastroesophageal reflux disease)  Cirrhosis of liver  Thrombocytopenia  Depression with anxiety  CAP (community acquired pneumonia)  HCAP (healthcare-associated pneumonia)  Severe sepsis  Protein-calorie malnutrition, severe    Past Medical History  Diagnosis Date  . Hypertension   . High cholesterol   . Anxiety disorder   . Pancytopenia   . UTI (lower urinary tract infection)   . Depression   . Anxiety   . Leg cramps   . Type II diabetes mellitus   . Liver cirrhosis secondary to NASH   . Infectious hepatitis 1971  . Pneumonia ~ 07/2014; 09/2014    Past Surgical History  Procedure Laterality Date  . Tonsillectomy    . Esophagogastroduodenoscopy  11/04/2011    Procedure: ESOPHAGOGASTRODUODENOSCOPY (EGD); Surgeon: Arta Silence, MD; Location: [facility]; Service: Endoscopy; Laterality: N/A;  . Colonoscopy  11/04/2011    Procedure: COLONOSCOPY; Surgeon: Arta Silence, MD; Location: [facility]; Service: Endoscopy; Laterality: N/A;  . Vasectomy  1985  . Fixation kyphoplasty lumbar spine      "L5"  . Cardiac catheterization  "years ago"  . Inguinal hernia repair Bilateral 06/2013     Hospital  Course See H&P, Labs, Consult and Test reports for all details in brief, patient was admitted for **  Principal Problem:  Sepsis Active Problems:  Hyperlipidemia  GERD (gastroesophageal reflux disease)  Cirrhosis of liver  Thrombocytopenia  Depression with anxiety  CAP (community acquired pneumonia)  HCAP (healthcare-associated pneumonia)  Severe sepsis  Protein-calorie malnutrition, severe  64 y.o. Male with history of cirrhosis due to [facility], recent admission for pneumonia, ascites which is in good control on diuretics, depression, DM type II, right upper lobe nodule   and a history of gallstones, who came to the ED in referral from the local free clinic for evaluation of shortness of breath and hypoxia.  In the ER workup was consistent with HCAP + sepsis with metabolic acidosis. Patient noted chills and subjective fevers with severe rigors, as well as shortness of breath for a few days. A shunt was admitted initially to step down, with broad-spectrum IV antibiotics, respiratory status much improved, back to room air, blood culture growing Streptococcus group B, sensitive to levofloxacin, patient antibodies were switched to levofloxacin, to finish total of 14 days for treatment.  Septic shock secondary to HCAP resolved   Acute hypoxic respiratory failure due to HCAP CTA chest notes subpleural reticulation B suggestive of NSIP - outpt high resolution CT chest suggested - pt has been afebrile here, w/ normal WBC On admission, but reported rigors at home  - flu panel negative so stopped tamiflu  - Started on broad-spectrum IV antibiotics, blood cultures growing Streptococcus group B sensitive to levofloxacin soTransitioned IV vancomycin and cefepime to levofloxacin 3/20 ( Streptococcus sensitive to levofloxacin on blood culture), To finish total of 14 days of antibiotics  Given Streptococcus group B bacteremia,  L pleuritic chest wall pain Likely simple pulled intercostal  muscle, - good air movement noted in L lung fields  - Incentive spirometry, when necessary pain medicine.  Blood cx + Group B strep Sensitive to levofloxacin, antibiotics changed to levofloxacin,  Repeat blood culture 3/20 no growth to date Discussed with infectious disease Dr Linus Salmons, will need to treat total of 14 days, To finish another 9 days of oral levofloxacin as an outpatient. 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  Elevated d-Dimer No PE on CTA chest        11/09/2014 1st [facility] office visit/ Melvyn Novas  / Never smoker  Chief Complaint  Patient presents with  . HFU    Pt c/o increased cough past few days- non prod. He states breathing bothers him off and on- sometimes gets SOB walking from room to room at home.   Has not done well i with breathing   Since June 2015   Has one paracentesis x 2 liters which helped his breathing and correlates with fluid in legs as well  Ran out of lasix but plans f/u with Dr Paulita Fujita re management of ascites   No obvious other patterns in  day to day or daytime variabilty or assoc cp or chest tightness, subjective wheeze overt sinus or hb symptoms. No unusual exp hx or h/o childhood pna/ asthma or knowledge of premature birth.  Sleeping ok without nocturnal  or early am exacerbation  of respiratory  c/o's or need for noct saba. Also denies  any obvious fluctuation of symptoms with weather or environmental changes or other aggravating or alleviating factors except as outlined above   Current Medications, Allergies, Complete Past Medical History, Past Surgical History, Family History, and Social History were reviewed in [employer] record.  ROS  The following are not active complaints unless bolded sore throat, dysphagia, dental problems, itching, sneezing,  nasal congestion or excess/ purulent secretions, ear ache,   fever, chills, sweats, unintended wt loss, pleuritic or exertional cp, hemoptysis,  orthopnea pnd or leg swelling, presyncope, palpitations, heartburn, abdominal pain, anorexia, nausea, vomiting, diarrhea  or change in bowel or urinary habits, change in stools or urine, dysuria,hematuria,  rash, arthralgias, visual complaints, headache, numbness weakness or ataxia or problems with walking or coordination,  change in mood/affect or memory.          Review of Systems     Objective:   Physical Exam    amb wm nad  Wt Readings from Last 3 Encounters:  11/09/14 168 lb 9.6 oz (76.476 kg)  10/22/14 178 lb 3.2 oz (80.831 kg)  10/09/14 170 lb 6.4 oz (77.293 kg)    Vital signs reviewed  HEENT: nl dentition, turbinates, and orophanx. Nl external ear canals without cough reflex   NECK :  without JVD/Nodes/TM/  nl carotid upstrokes bilaterally   LUNGS: no acc muscle use, clear to A and P bilaterally without cough on insp or exp maneuvers   CV:  RRR  no s3 or murmur or increase in P2,  1-2 + pitting bilateral leg/ankle edema   ABD: mildly distended with shifting dullness/  soft and nontender with nl excursion in the supine position. No bruits or organomegaly, bowel sounds nl  MS:  warm without deformities, calf tenderness, cyanosis or clubbing  SKIN: warm and dry without lesions    NEURO:  alert, approp, no deficits     I personally reviewed images and agree with radiology impression as  follows:  CXR:  11/08/14 The study is limited due to mild hypoinflation. There remain increased interstitial markings bilaterally which may be chronic. There is subtle lingular infiltrate and small left pleural effusion. There been interval improvement especially on the left since October 06, 2014.  Assessment:

## 2014-11-09 NOTE — Patient Instructions (Addendum)
Be sure to sure to check with Dr Paulita Fujita re safety of taking lipitor and also how to adjust your lasix and aldactone   There is no need to work up your breathing further until fluid is better controlled and after that please call to schedule a follow up office visit with pfts

## 2014-11-10 ENCOUNTER — Encounter: Payer: Self-pay | Admitting: Internal Medicine

## 2014-11-10 DIAGNOSIS — R0600 Dyspnea, unspecified: Secondary | ICD-10-CM | POA: Insufficient documentation

## 2014-11-10 NOTE — Assessment & Plan Note (Signed)
Onset summer 2015 assoc with ascites/ much better p paracentesis 08/16/14 x 2liters CTa 10/04/14  1. No evidence of pulmonary embolism. 2. Chronic, small to moderate left pleural effusion. 3. Persistent subpleural reticulation throughout the bilateral lungs. Persistence raises the possibility of interstitial lung disease (especially NSIP). Given patient's continued/worsening shortness of breath, consider outpatient high-resolution chest CT. 4. Cirrhosis with varices and large ascites. - 11/09/2014   Walked RA x one lap @ 185 stopped due to weak in legs/ no sob, slow pace, sats 89% at very end    I had an extended discussion with the patient reviewing all relevant studies completed to date and  lasting 35  minutes visit on the following ongoing concerns:   1) there is no nodule that needs to be followed/ never smoker so risk of lung ca is very low  2) he does have mild increased markings but I suspect this is all related to edema/? hypoalb related and no need to return to this clinic until the overall problem of edema / ascites is optimally "tuned"  Clearly not the case at present as he doesn't even have his lasix and is unsure of who is following what problem but does have f/u with Dr Paulita Fujita next week to hopefully sort out then  3) pulmonary f/u is prn

## 2014-11-12 ENCOUNTER — Other Ambulatory Visit (HOSPITAL_COMMUNITY): Payer: Self-pay | Admitting: Gastroenterology

## 2014-11-12 ENCOUNTER — Other Ambulatory Visit: Payer: Self-pay | Admitting: Gastroenterology

## 2014-11-12 DIAGNOSIS — R160 Hepatomegaly, not elsewhere classified: Secondary | ICD-10-CM

## 2014-11-12 DIAGNOSIS — R188 Other ascites: Secondary | ICD-10-CM

## 2014-11-12 DIAGNOSIS — K743 Primary biliary cirrhosis: Secondary | ICD-10-CM

## 2014-11-16 ENCOUNTER — Encounter (HOSPITAL_COMMUNITY): Payer: Self-pay

## 2014-11-16 ENCOUNTER — Ambulatory Visit (HOSPITAL_COMMUNITY)
Admission: RE | Admit: 2014-11-16 | Discharge: 2014-11-16 | Disposition: A | Discharge status: Home / Self Care | Payer: Medicare Other | Source: Ambulatory Visit | Attending: Gastroenterology | Admitting: Gastroenterology

## 2014-11-16 ENCOUNTER — Encounter (HOSPITAL_COMMUNITY)
Admission: RE | Admit: 2014-11-16 | Discharge: 2014-11-16 | Disposition: A | Discharge status: Home / Self Care | Payer: Medicare Other | Source: Ambulatory Visit | Attending: Gastroenterology | Admitting: Gastroenterology

## 2014-11-16 DIAGNOSIS — R188 Other ascites: Secondary | ICD-10-CM

## 2014-11-16 DIAGNOSIS — Z87440 Personal history of urinary (tract) infections: Secondary | ICD-10-CM

## 2014-11-16 DIAGNOSIS — A419 Sepsis, unspecified organism: Secondary | ICD-10-CM | POA: Diagnosis not present

## 2014-11-16 DIAGNOSIS — E871 Hypo-osmolality and hyponatremia: Secondary | ICD-10-CM | POA: Diagnosis not present

## 2014-11-16 DIAGNOSIS — K7581 Nonalcoholic steatohepatitis (NASH): Secondary | ICD-10-CM | POA: Diagnosis present

## 2014-11-16 DIAGNOSIS — J9811 Atelectasis: Secondary | ICD-10-CM | POA: Diagnosis present

## 2014-11-16 DIAGNOSIS — K7460 Unspecified cirrhosis of liver: Secondary | ICD-10-CM | POA: Diagnosis present

## 2014-11-16 DIAGNOSIS — J90 Pleural effusion, not elsewhere classified: Secondary | ICD-10-CM | POA: Diagnosis present

## 2014-11-16 DIAGNOSIS — F17210 Nicotine dependence, cigarettes, uncomplicated: Secondary | ICD-10-CM | POA: Diagnosis present

## 2014-11-16 DIAGNOSIS — K743 Primary biliary cirrhosis: Secondary | ICD-10-CM | POA: Insufficient documentation

## 2014-11-16 DIAGNOSIS — Z6820 Body mass index (BMI) 20.0-20.9, adult: Secondary | ICD-10-CM

## 2014-11-16 DIAGNOSIS — D689 Coagulation defect, unspecified: Secondary | ICD-10-CM | POA: Diagnosis present

## 2014-11-16 DIAGNOSIS — E119 Type 2 diabetes mellitus without complications: Secondary | ICD-10-CM | POA: Diagnosis present

## 2014-11-16 DIAGNOSIS — Z66 Do not resuscitate: Secondary | ICD-10-CM | POA: Diagnosis present

## 2014-11-16 DIAGNOSIS — Z79899 Other long term (current) drug therapy: Secondary | ICD-10-CM

## 2014-11-16 DIAGNOSIS — I10 Essential (primary) hypertension: Secondary | ICD-10-CM | POA: Diagnosis present

## 2014-11-16 DIAGNOSIS — Z7982 Long term (current) use of aspirin: Secondary | ICD-10-CM

## 2014-11-16 DIAGNOSIS — J9601 Acute respiratory failure with hypoxia: Secondary | ICD-10-CM | POA: Diagnosis present

## 2014-11-16 DIAGNOSIS — J189 Pneumonia, unspecified organism: Secondary | ICD-10-CM | POA: Diagnosis present

## 2014-11-16 DIAGNOSIS — Z8249 Family history of ischemic heart disease and other diseases of the circulatory system: Secondary | ICD-10-CM

## 2014-11-16 DIAGNOSIS — E785 Hyperlipidemia, unspecified: Secondary | ICD-10-CM | POA: Diagnosis present

## 2014-11-16 DIAGNOSIS — Z8701 Personal history of pneumonia (recurrent): Secondary | ICD-10-CM

## 2014-11-16 DIAGNOSIS — E43 Unspecified severe protein-calorie malnutrition: Secondary | ICD-10-CM | POA: Diagnosis present

## 2014-11-16 DIAGNOSIS — Y95 Nosocomial condition: Secondary | ICD-10-CM | POA: Diagnosis present

## 2014-11-16 DIAGNOSIS — D61818 Other pancytopenia: Secondary | ICD-10-CM | POA: Diagnosis present

## 2014-11-16 LAB — BODY FLUID CELL COUNT WITH DIFFERENTIAL
EOS FL: 1 %
LYMPHS FL: 45 %
Monocyte-Macrophage-Serous Fluid: 52 % (ref 50–90)
Neutrophil Count, Fluid: 2 % (ref 0–25)
WBC FLUID: 132 uL (ref 0–1000)

## 2014-11-16 LAB — ALBUMIN, FLUID (OTHER)

## 2014-11-16 MED ORDER — SODIUM CHLORIDE 0.9 % IV SOLN
INTRAVENOUS | Status: DC
  Administered 2014-11-16: 12:00:00 via INTRAVENOUS

## 2014-11-16 MED ORDER — ALBUMIN HUMAN 25 % IV SOLN
25.0000 g | Freq: Once | INTRAVENOUS | Status: AC
  Administered 2014-11-16: 25 g via INTRAVENOUS
  Filled 2014-11-16: qty 100

## 2014-11-16 NOTE — Discharge Instructions (Signed)
° ° ° ° ° ° ° ° ° ° ° ° ° ° ° ° ° ° ° ° ° ° ° ° ° ° ° ° ° ° ° ° ° ° ° ° ° ° ° ° ° ° ° ° ° ° ° ° ° ° ° ° ° ° ° ° ° ° ° ° ° ° ° ° ° ° ° ° ° ° ° ° ° ° ° ° ° ° ° ° ° ° ° ° ° ° ° ° ° ° ° ° ° ° ° ° ° ° ° ° ° ° ° ° °  Albumin °Albumin tests are done as a screen for a liver disorder or kidney disease or to check nutritional status, especially in hospitalized patients (prealbumin is sometimes used instead of albumin in this situation). °Albumin is the most plentiful protein in the blood plasma. It keeps fluid from leaking out of blood vessels; nourishes tissues; and transports hormones, vitamins, drugs, and ions like calcium throughout the body. Albumin is made in the liver and is extremely sensitive to liver damage. The concentration of albumin drops when the liver is damaged, with kidney disease (nephrotic syndrome), when a person is malnourished, if a person experiences inflammation in the body, or with shock. Albumin increases when a person is dehydrated. °PREPARATION FOR TEST °No preparation or fasting is necessary. A blood sample is taken by a needle from a vein. Tell the person doing the test if you are pregnant. °NORMAL FINDINGS  °Adult/Elderly °· Total Protein: 6.4-8.3 g/dL or 64-83g/L (SI units) °· Albumin; 3.5-5 g/dL or 35-50 g/L (SI units) °· Globulin 2.3-3.4 g/dL °¨ Alpha1 globulin: 0.1-3 g/dL or 1-3 g/L (SI units) °¨ Alpha2 globulin: 0.6-1 g/dL or6-10 g/L (SI units) °¨ Beta globulin: 0.7-1.1 g/dL or 7-11 g/L (SI units) °Children °· Total protein. °¨ Premature infant: 4.2-7.6 g/L °¨ Newborn: 4.6-7.4 g/dL °¨ Infant: 6-6.7 g/L °· Albumin. °¨ Premature infant: 3-4.2 g/dL °¨ Newborn: 3.5-5.4 g/dL °¨ Infant: 4.4-5.4 g/dL °¨ Child: 4-5.9 g/dL °Ranges for normal findings may vary among different laboratories and hospitals. You should always check with your doctor after having lab work or other tests done to discuss the meaning of your test results and whether your values are considered within normal limits. °MEANING OF  TEST  °Your caregiver will go over the test results with you and discuss the importance and meaning of your results, as well as treatment options and the need for additional tests if necessary. °OBTAINING THE TEST RESULTS °It is your responsibility to obtain your test results. Ask the lab or department performing the test when and how you will get your results. °Document Released: 07/28/2004 Document Revised: 09/28/2011 Document Reviewed: 06/10/2008 °[employer]® Patient Information ©2015 [employer]. This information is not intended to replace advice given to you by your health care provider. Make sure you discuss any questions you have with your health care provider. ° °

## 2014-11-16 NOTE — Procedures (Signed)
Ultrasound-guided diagnostic and therapeutic paracentesis performed yielding 2.6 L turbid, yellow fluid. A portion of the fluid was submitted to the laboratory for preordered studies. No immediate complications. The patient will receive IV albumin infusion post procedure.

## 2014-11-19 ENCOUNTER — Other Ambulatory Visit (HOSPITAL_COMMUNITY): Payer: Self-pay

## 2014-11-19 ENCOUNTER — Emergency Department (HOSPITAL_COMMUNITY): Payer: Medicare Other

## 2014-11-19 ENCOUNTER — Inpatient Hospital Stay (HOSPITAL_COMMUNITY)
Admission: EM | Admit: 2014-11-19 | Discharge: 2014-11-23 | DRG: 871 | Disposition: A | Discharge status: Home Health | Payer: Medicare Other | Attending: Internal Medicine | Admitting: Internal Medicine

## 2014-11-19 ENCOUNTER — Encounter (HOSPITAL_COMMUNITY): Payer: Self-pay | Admitting: *Deleted

## 2014-11-19 DIAGNOSIS — R0600 Dyspnea, unspecified: Secondary | ICD-10-CM | POA: Diagnosis not present

## 2014-11-19 DIAGNOSIS — J90 Pleural effusion, not elsewhere classified: Secondary | ICD-10-CM | POA: Diagnosis present

## 2014-11-19 DIAGNOSIS — Z87440 Personal history of urinary (tract) infections: Secondary | ICD-10-CM | POA: Diagnosis not present

## 2014-11-19 DIAGNOSIS — Z7982 Long term (current) use of aspirin: Secondary | ICD-10-CM | POA: Diagnosis not present

## 2014-11-19 DIAGNOSIS — I10 Essential (primary) hypertension: Secondary | ICD-10-CM | POA: Diagnosis present

## 2014-11-19 DIAGNOSIS — Z8249 Family history of ischemic heart disease and other diseases of the circulatory system: Secondary | ICD-10-CM | POA: Diagnosis not present

## 2014-11-19 DIAGNOSIS — R188 Other ascites: Secondary | ICD-10-CM | POA: Diagnosis present

## 2014-11-19 DIAGNOSIS — Z79899 Other long term (current) drug therapy: Secondary | ICD-10-CM | POA: Diagnosis not present

## 2014-11-19 DIAGNOSIS — J9811 Atelectasis: Secondary | ICD-10-CM | POA: Diagnosis present

## 2014-11-19 DIAGNOSIS — K7460 Unspecified cirrhosis of liver: Secondary | ICD-10-CM | POA: Diagnosis present

## 2014-11-19 DIAGNOSIS — J9601 Acute respiratory failure with hypoxia: Secondary | ICD-10-CM | POA: Diagnosis present

## 2014-11-19 DIAGNOSIS — K7469 Other cirrhosis of liver: Secondary | ICD-10-CM | POA: Diagnosis not present

## 2014-11-19 DIAGNOSIS — D61818 Other pancytopenia: Secondary | ICD-10-CM | POA: Diagnosis present

## 2014-11-19 DIAGNOSIS — E119 Type 2 diabetes mellitus without complications: Secondary | ICD-10-CM | POA: Diagnosis present

## 2014-11-19 DIAGNOSIS — K7581 Nonalcoholic steatohepatitis (NASH): Secondary | ICD-10-CM | POA: Diagnosis present

## 2014-11-19 DIAGNOSIS — Z66 Do not resuscitate: Secondary | ICD-10-CM | POA: Diagnosis present

## 2014-11-19 DIAGNOSIS — J189 Pneumonia, unspecified organism: Secondary | ICD-10-CM | POA: Diagnosis present

## 2014-11-19 DIAGNOSIS — A419 Sepsis, unspecified organism: Secondary | ICD-10-CM | POA: Diagnosis present

## 2014-11-19 DIAGNOSIS — D689 Coagulation defect, unspecified: Secondary | ICD-10-CM | POA: Diagnosis present

## 2014-11-19 DIAGNOSIS — R0602 Shortness of breath: Secondary | ICD-10-CM

## 2014-11-19 DIAGNOSIS — Z9889 Other specified postprocedural states: Secondary | ICD-10-CM

## 2014-11-19 DIAGNOSIS — F17210 Nicotine dependence, cigarettes, uncomplicated: Secondary | ICD-10-CM | POA: Diagnosis present

## 2014-11-19 DIAGNOSIS — Z8701 Personal history of pneumonia (recurrent): Secondary | ICD-10-CM | POA: Diagnosis not present

## 2014-11-19 DIAGNOSIS — K7030 Alcoholic cirrhosis of liver without ascites: Secondary | ICD-10-CM | POA: Diagnosis not present

## 2014-11-19 DIAGNOSIS — E785 Hyperlipidemia, unspecified: Secondary | ICD-10-CM | POA: Diagnosis present

## 2014-11-19 DIAGNOSIS — J948 Other specified pleural conditions: Secondary | ICD-10-CM | POA: Diagnosis not present

## 2014-11-19 DIAGNOSIS — Y95 Nosocomial condition: Secondary | ICD-10-CM | POA: Diagnosis present

## 2014-11-19 DIAGNOSIS — E43 Unspecified severe protein-calorie malnutrition: Secondary | ICD-10-CM | POA: Diagnosis not present

## 2014-11-19 DIAGNOSIS — Z6820 Body mass index (BMI) 20.0-20.9, adult: Secondary | ICD-10-CM | POA: Diagnosis not present

## 2014-11-19 DIAGNOSIS — E871 Hypo-osmolality and hyponatremia: Secondary | ICD-10-CM | POA: Diagnosis not present

## 2014-11-19 LAB — CBC WITH DIFFERENTIAL/PLATELET
Basophils Absolute: 0 10*3/uL (ref 0.0–0.1)
Basophils Relative: 1 % (ref 0–1)
Eosinophils Absolute: 0.2 10*3/uL (ref 0.0–0.7)
Eosinophils Relative: 6 % — ABNORMAL HIGH (ref 0–5)
HCT: 29 % — ABNORMAL LOW (ref 39.0–52.0)
HEMOGLOBIN: 9.9 g/dL — AB (ref 13.0–17.0)
LYMPHS ABS: 0.5 10*3/uL — AB (ref 0.7–4.0)
LYMPHS PCT: 14 % (ref 12–46)
MCH: 31.4 pg (ref 26.0–34.0)
MCHC: 34.1 g/dL (ref 30.0–36.0)
MCV: 92.1 fL (ref 78.0–100.0)
MONOS PCT: 11 % (ref 3–12)
Monocytes Absolute: 0.4 10*3/uL (ref 0.1–1.0)
NEUTROS PCT: 68 % (ref 43–77)
Neutro Abs: 2.5 10*3/uL (ref 1.7–7.7)
Platelets: 58 10*3/uL — ABNORMAL LOW (ref 150–400)
RBC: 3.15 MIL/uL — ABNORMAL LOW (ref 4.22–5.81)
RDW: 16.8 % — AB (ref 11.5–15.5)
WBC: 3.7 10*3/uL — ABNORMAL LOW (ref 4.0–10.5)

## 2014-11-19 LAB — COMPREHENSIVE METABOLIC PANEL
ALBUMIN: 2.4 g/dL — AB (ref 3.5–5.0)
ALK PHOS: 113 U/L (ref 38–126)
ALT: 25 U/L (ref 17–63)
ANION GAP: 9 (ref 5–15)
AST: 48 U/L — ABNORMAL HIGH (ref 15–41)
BUN: 13 mg/dL (ref 6–20)
CALCIUM: 8.5 mg/dL — AB (ref 8.9–10.3)
CHLORIDE: 104 mmol/L (ref 101–111)
CO2: 22 mmol/L (ref 22–32)
Creatinine, Ser: 1.12 mg/dL (ref 0.61–1.24)
GFR calc Af Amer: >60 mL/min (ref >60)
GFR calc non Af Amer: >60 mL/min (ref >60)
GLUCOSE: 178 mg/dL — AB (ref 70–99)
Potassium: 3.9 mmol/L (ref 3.5–5.1)
SODIUM: 135 mmol/L (ref 135–145)
Total Bilirubin: 2.6 mg/dL — ABNORMAL HIGH (ref 0.3–1.2)
Total Protein: 7.2 g/dL (ref 6.5–8.1)

## 2014-11-19 LAB — BODY FLUID CULTURE: Culture: NO GROWTH

## 2014-11-19 LAB — I-STAT ARTERIAL BLOOD GAS, ED
ACID-BASE DEFICIT: 3 mmol/L — AB (ref 0.0–2.0)
BICARBONATE: 19.9 meq/L — AB (ref 20.0–24.0)
O2 Saturation: 95 %
PH ART: 7.456 — AB (ref 7.350–7.450)
Patient temperature: 97.7
TCO2: 21 mmol/L (ref 0–100)
pCO2 arterial: 28.2 mmHg — ABNORMAL LOW (ref 35.0–45.0)
pO2, Arterial: 67 mmHg — ABNORMAL LOW (ref 80.0–100.0)

## 2014-11-19 LAB — I-STAT CG4 LACTIC ACID, ED: Lactic Acid, Venous: 2.6 mmol/L (ref 0.5–2.0)

## 2014-11-19 LAB — PROCALCITONIN: Procalcitonin: <0.1 ng/mL

## 2014-11-19 LAB — PROTIME-INR
INR: 1.66 — AB (ref 0.00–1.49)
PROTHROMBIN TIME: 19.7 s — AB (ref 11.6–15.2)

## 2014-11-19 LAB — GLUCOSE, CAPILLARY: Glucose-Capillary: 170 mg/dL — ABNORMAL HIGH (ref 70–99)

## 2014-11-19 LAB — I-STAT TROPONIN, ED: Troponin i, poc: 0 ng/mL (ref 0.00–0.08)

## 2014-11-19 LAB — LACTIC ACID, PLASMA: Lactic Acid, Venous: 2.8 mmol/L (ref 0.5–2.0)

## 2014-11-19 LAB — MRSA PCR SCREENING: MRSA BY PCR: NEGATIVE

## 2014-11-19 LAB — BRAIN NATRIURETIC PEPTIDE: B NATRIURETIC PEPTIDE 5: 77.4 pg/mL (ref 0.0–100.0)

## 2014-11-19 LAB — APTT: aPTT: 39 seconds — ABNORMAL HIGH (ref 24–37)

## 2014-11-19 MED ORDER — SODIUM CHLORIDE 0.9 % IJ SOLN
3.0000 mL | Freq: Two times a day (BID) | INTRAMUSCULAR | Status: DC
  Administered 2014-11-19 – 2014-11-22 (×6): 3 mL via INTRAVENOUS

## 2014-11-19 MED ORDER — SODIUM CHLORIDE 0.9 % IV BOLUS (SEPSIS)
500.0000 mL | INTRAVENOUS | Status: AC
  Administered 2014-11-19: 500 mL via INTRAVENOUS

## 2014-11-19 MED ORDER — SODIUM CHLORIDE 0.9 % IV SOLN
1000.0000 mL | INTRAVENOUS | Status: DC
  Administered 2014-11-19: 1000 mL via INTRAVENOUS

## 2014-11-19 MED ORDER — PIPERACILLIN-TAZOBACTAM 3.375 G IVPB
3.3750 g | Freq: Three times a day (TID) | INTRAVENOUS | Status: DC
  Administered 2014-11-19 – 2014-11-21 (×5): 3.375 g via INTRAVENOUS
  Filled 2014-11-19 (×8): qty 50

## 2014-11-19 MED ORDER — ATORVASTATIN CALCIUM 20 MG PO TABS
20.0000 mg | ORAL_TABLET | Freq: Every day | ORAL | Status: DC
  Administered 2014-11-19 – 2014-11-22 (×4): 20 mg via ORAL
  Filled 2014-11-19 (×5): qty 1

## 2014-11-19 MED ORDER — ONDANSETRON HCL 4 MG/2ML IJ SOLN: 4.0000 mg | Freq: Four times a day (QID) | INTRAMUSCULAR | Status: DC | PRN

## 2014-11-19 MED ORDER — SODIUM CHLORIDE 0.9 % IV SOLN
INTRAVENOUS | Status: AC
  Administered 2014-11-19: 16:00:00 via INTRAVENOUS

## 2014-11-19 MED ORDER — IOHEXOL 350 MG/ML SOLN
80.0000 mL | Freq: Once | INTRAVENOUS | Status: AC | PRN
  Administered 2014-11-19: 80 mL via INTRAVENOUS

## 2014-11-19 MED ORDER — INSULIN ASPART 100 UNIT/ML ~~LOC~~ SOLN
0.0000 [IU] | Freq: Three times a day (TID) | SUBCUTANEOUS | Status: DC
  Administered 2014-11-20 – 2014-11-21 (×3): 4 [IU] via SUBCUTANEOUS
  Administered 2014-11-22: 7 [IU] via SUBCUTANEOUS
  Administered 2014-11-22: 3 [IU] via SUBCUTANEOUS
  Administered 2014-11-23: 4 [IU] via SUBCUTANEOUS

## 2014-11-19 MED ORDER — SODIUM CHLORIDE 0.9 % IV SOLN
1000.0000 mL | INTRAVENOUS | Status: DC
  Administered 2014-11-20: 1000 mL via INTRAVENOUS

## 2014-11-19 MED ORDER — PIPERACILLIN-TAZOBACTAM 3.375 G IVPB 30 MIN
3.3750 g | Freq: Once | INTRAVENOUS | Status: AC
  Administered 2014-11-19: 3.375 g via INTRAVENOUS
  Filled 2014-11-19: qty 50

## 2014-11-19 MED ORDER — LACTULOSE 10 GM/15ML PO SOLN
20.0000 g | Freq: Every day | ORAL | Status: DC
  Administered 2014-11-19 – 2014-11-23 (×5): 20 g via ORAL
  Filled 2014-11-19 (×5): qty 30

## 2014-11-19 MED ORDER — SODIUM CHLORIDE 0.9 % IV BOLUS (SEPSIS)
500.0000 mL | Freq: Once | INTRAVENOUS | Status: AC
  Administered 2014-11-19: 500 mL via INTRAVENOUS

## 2014-11-19 MED ORDER — VANCOMYCIN HCL IN DEXTROSE 1-5 GM/200ML-% IV SOLN
1000.0000 mg | Freq: Once | INTRAVENOUS | Status: AC
  Administered 2014-11-19: 1000 mg via INTRAVENOUS
  Filled 2014-11-19: qty 200

## 2014-11-19 MED ORDER — INSULIN ASPART 100 UNIT/ML ~~LOC~~ SOLN
0.0000 [IU] | Freq: Every day | SUBCUTANEOUS | Status: DC
  Administered 2014-11-22: 2 [IU] via SUBCUTANEOUS

## 2014-11-19 MED ORDER — ASPIRIN EC 81 MG PO TBEC
81.0000 mg | DELAYED_RELEASE_TABLET | Freq: Every day | ORAL | Status: DC
  Administered 2014-11-19 – 2014-11-23 (×5): 81 mg via ORAL
  Filled 2014-11-19 (×5): qty 1

## 2014-11-19 MED ORDER — SODIUM CHLORIDE 0.9 % IV BOLUS (SEPSIS)
1000.0000 mL | INTRAVENOUS | Status: AC
  Administered 2014-11-19: 1000 mL via INTRAVENOUS

## 2014-11-19 MED ORDER — ONDANSETRON HCL 4 MG PO TABS: 4.0000 mg | ORAL_TABLET | Freq: Four times a day (QID) | ORAL | Status: DC | PRN

## 2014-11-19 MED ORDER — VANCOMYCIN HCL IN DEXTROSE 1-5 GM/200ML-% IV SOLN
1000.0000 mg | Freq: Two times a day (BID) | INTRAVENOUS | Status: DC
  Administered 2014-11-20 – 2014-11-21 (×3): 1000 mg via INTRAVENOUS
  Filled 2014-11-19 (×6): qty 200

## 2014-11-19 MED ORDER — ENSURE ENLIVE PO LIQD
237.0000 mL | Freq: Two times a day (BID) | ORAL | Status: DC
  Administered 2014-11-20 – 2014-11-21 (×3): 237 mL via ORAL

## 2014-11-19 NOTE — ED Notes (Signed)
Pt is here with sob, weakness, and low 02 levels.  Pt had a paracentesis done on Friday.  Pt states sob never improved since then.  Pt reports that he had PNA about one month ago and was hospitalized.  Pt sats on arrival was 82-86% on RA.  Pt has cough and has been placed on 02 AT TRIAGE.

## 2014-11-19 NOTE — ED Provider Notes (Signed)
CSN: [id number]     Arrival date & time 11/19/14  1221 History   First MD Initiated Contact with Patient 11/19/14 1340     Chief Complaint  Patient presents with  . Shortness of Breath  . Weakness     (Consider location/radiation/quality/duration/timing/severity/associated sxs/prior Treatment) HPI Comments: She complains of difficulty breathing with low oxygen level. He states he's had difficulty breathing since January but became worse over the past several days. His health care aide checked his oxygen saturation was 82% on room air. He does not wear oxygen at home. He endorses cough productive of clear and yellow mucous. He was hospitalized in March for pneumonia states he never really got better. He denies fevers at home. He has some chest pain with coughing. He denies any abdominal pain or back pain. No leg swelling.  Patient with history of liver cirrhosis secondary to NASH had a paracentesis 2 days ago. Denies any abdominal pain currently.  Patient is a 64 y.o. male presenting with weakness. The history is provided by the patient and a relative. The history is limited by the condition of the patient.  Weakness Associated symptoms include shortness of breath. Pertinent negatives include no abdominal pain and no headaches.    Past Medical History  Diagnosis Date  . Hypertension   . High cholesterol   . Anxiety disorder   . Pancytopenia   . UTI (lower urinary tract infection)   . Depression   . Anxiety   . Leg cramps   . Type II diabetes mellitus   . Liver cirrhosis secondary to NASH   . Infectious hepatitis 1971  . Pneumonia ~ 07/2014; 09/2014   Past Surgical History  Procedure Laterality Date  . Tonsillectomy    . Esophagogastroduodenoscopy  11/04/2011    Procedure: ESOPHAGOGASTRODUODENOSCOPY (EGD);  Surgeon: Arta Silence, MD;  Location: [facility];  Service: Endoscopy;  Laterality: N/A;  . Colonoscopy  11/04/2011    Procedure: COLONOSCOPY;  Surgeon: Arta Silence, MD;   Location: [facility];  Service: Endoscopy;  Laterality: N/A;  . Vasectomy  1985  . Fixation kyphoplasty lumbar spine      "L5"  . Cardiac catheterization  "years ago"  . Inguinal hernia repair Bilateral 06/2013   Family History  Problem Relation Age of Onset  . Coronary artery disease    . Hypertension    . Heart failure Father   . Hypertension Father   . Heart disease Father   . Heart failure Mother   . Hypertension Mother   . Heart disease Mother   . Stroke Paternal Grandmother   . Obesity Daughter    History  Substance Use Topics  . Smoking status: Passive Smoke Exposure - Never Smoker    Types: Cigarettes  . Smokeless tobacco: Former Systems developer    Types: Chew     Comment: "used to chew tobacco when I played baseball"  . Alcohol Use: 0.6 oz/week    1 Standard drinks or equivalent per week     Comment: 10/04/2014 "haven't drank anything at all for 6 months because of the cirrhosis; before that a couple drinks/month"    Review of Systems  Constitutional: Positive for fever, activity change, appetite change and fatigue.  HENT: Positive for congestion and rhinorrhea.   Respiratory: Positive for cough, chest tightness and shortness of breath.   Gastrointestinal: Negative for nausea, vomiting and abdominal pain.  Genitourinary: Negative for dysuria and hematuria.  Musculoskeletal: Positive for myalgias and arthralgias.  Skin: Negative for rash.  Neurological:  Positive for weakness. Negative for dizziness and headaches.  A complete 10 system review of systems was obtained and all systems are negative except as noted in the HPI and PMH.      Allergies  Review of patient's allergies indicates no known allergies.  Home Medications   Prior to Admission medications   Medication Sig Start Date End Date Taking? Authorizing Provider  aspirin EC 81 MG EC tablet Take 1 tablet (81 mg total) by mouth daily. 08/24/14  Yes Kinnie Feil, MD  atorvastatin (LIPITOR) 20 MG tablet Take 1  tablet (20 mg total) by mouth daily. 10/22/14  Yes Lorayne Marek, MD  diphenhydrAMINE-zinc acetate (BENADRYL) cream Apply topically 3 (three) times daily as needed for itching. 08/24/14  Yes Kinnie Feil, MD  escitalopram (LEXAPRO) 20 MG tablet Take 1 tablet (20 mg total) by mouth daily. 08/24/14  Yes Kinnie Feil, MD  feeding supplement, ENSURE COMPLETE, (ENSURE COMPLETE) LIQD Take 237 mLs by mouth 2 (two) times daily between meals. 10/09/14  Yes Albertine Patricia, MD  furosemide (LASIX) 40 MG tablet Take 1 tablet (40 mg total) by mouth daily. Patient taking differently: Take 60 mg by mouth daily.  10/22/14  Yes Lorayne Marek, MD  hydrocortisone (ANUSOL-HC) 2.5 % rectal cream Apply topically 2 (two) times daily. 08/24/14  Yes Kinnie Feil, MD  lactulose (CHRONULAC) 10 GM/15ML solution Take 30 mLs (20 g total) by mouth daily. 10/09/14  Yes Albertine Patricia, MD  metFORMIN (GLUCOPHAGE) 1000 MG tablet Take 1,000 mg by mouth 2 (two) times daily with a meal.   Yes Historical Provider, MD  mirtazapine (REMERON SOL-TAB) 15 MG disintegrating tablet Take 1 tablet (15 mg total) by mouth at bedtime. 08/24/14  Yes Kinnie Feil, MD  propranolol (INDERAL) 10 MG tablet Take 1 tablet (10 mg total) by mouth 2 (two) times daily. 10/22/14  Yes Lorayne Marek, MD  spironolactone (ALDACTONE) 100 MG tablet Take 1 tablet (100 mg total) by mouth daily. Patient taking differently: Take 150 mg by mouth daily.  10/22/14  Yes Lorayne Marek, MD  traMADol (ULTRAM) 50 MG tablet Take 1 tablet (50 mg total) by mouth every 6 (six) hours as needed for moderate pain. 08/24/14  Yes Kinnie Feil, MD   BP 95/51 mmHg  Pulse 65  Temp(Src) 97.7 F (36.5 C) (Oral)  Resp 16  Wt 156 lb 4 oz (70.875 kg)  SpO2 100% Physical Exam  Constitutional: He is oriented to person, place, and time. He appears well-developed and well-nourished. He appears distressed.  Increased work of breathing, dyspnea with conversation  HENT:  Head:  Normocephalic and atraumatic.  Mouth/Throat: Oropharynx is clear and moist. No oropharyngeal exudate.  Eyes: Conjunctivae and EOM are normal. Pupils are equal, round, and reactive to light.  Neck: Normal range of motion. Neck supple.  No meningismus.  Cardiovascular: Normal rate, regular rhythm, normal heart sounds and intact distal pulses.   No murmur heard. Pulmonary/Chest: Breath sounds normal. He is in respiratory distress.  Deceased breath sounds with rhonchi  Abdominal: Soft. There is no tenderness. There is no rebound and no guarding.  Musculoskeletal: Normal range of motion. He exhibits no edema or tenderness.  Neurological: He is alert and oriented to person, place, and time. No cranial nerve deficit. He exhibits normal muscle tone. Coordination normal.  No ataxia on finger to nose bilaterally. No pronator drift. 5/5 strength throughout. CN 2-12 intact. Negative Romberg. Equal grip strength. Sensation intact. Gait is normal.  Skin: Skin is warm.  Psychiatric: He has a normal mood and affect. His behavior is normal.  Nursing note and vitals reviewed.   ED Course  Procedures (including critical care time) Labs Review Labs Reviewed  COMPREHENSIVE METABOLIC PANEL - Abnormal; Notable for the following:    Glucose, Bld 178 (*)    Calcium 8.5 (*)    Albumin 2.4 (*)    AST 48 (*)    Total Bilirubin 2.6 (*)    All other components within normal limits  CBC WITH DIFFERENTIAL/PLATELET - Abnormal; Notable for the following:    WBC 3.7 (*)    RBC 3.15 (*)    Hemoglobin 9.9 (*)    HCT 29.0 (*)    RDW 16.8 (*)    Platelets 58 (*)    Lymphs Abs 0.5 (*)    Eosinophils Relative 6 (*)    All other components within normal limits  PROTIME-INR - Abnormal; Notable for the following:    Prothrombin Time 19.7 (*)    INR 1.66 (*)    All other components within normal limits  I-STAT CG4 LACTIC ACID, ED - Abnormal; Notable for the following:    Lactic Acid, Venous 2.60 (*)    All other  components within normal limits  I-STAT ARTERIAL BLOOD GAS, ED - Abnormal; Notable for the following:    pH, Arterial 7.456 (*)    pCO2 arterial 28.2 (*)    pO2, Arterial 67.0 (*)    Bicarbonate 19.9 (*)    Acid-base deficit 3.0 (*)    All other components within normal limits  CULTURE, BLOOD (ROUTINE X 2)  CULTURE, BLOOD (ROUTINE X 2)  URINE CULTURE  BRAIN NATRIURETIC PEPTIDE  URINALYSIS, ROUTINE W REFLEX MICROSCOPIC  I-STAT TROPOININ, ED  I-STAT CG4 LACTIC ACID, ED  I-STAT TROPOININ, ED  I-STAT CG4 LACTIC ACID, ED  I-STAT CG4 LACTIC ACID, ED    Imaging Review Ct Angio Chest Pe W/cm &/or Wo Cm  11/19/2014   CLINICAL DATA:  Shortness of breath and weakness, recent paracentesis  EXAM: CT ANGIOGRAPHY CHEST WITH CONTRAST  TECHNIQUE: Multidetector CT imaging of the chest was performed using the standard protocol during bolus administration of intravenous contrast. Multiplanar CT image reconstructions and MIPs were obtained to evaluate the vascular anatomy.  CONTRAST:  32mL OMNIPAQUE IOHEXOL 350 MG/ML SOLN  COMPARISON:  10/04/2014  FINDINGS: A large left-sided pleural effusion is noted increased in the interval from the prior exam. Diffuse reticular fibrotic changes are noted throughout both lungs stable from the prior study. Left lower lobe atelectasis is noted related to the effusion.  The thoracic inlet is within normal limits. Aortic calcifications are noted without aneurysmal dilatation. The pulmonary artery demonstrates a normal branching pattern. No filling defects to suggest pulmonary emboli are identified. No significant hilar or mediastinal adenopathy is seen. Coronary calcifications are noted.  Scanning into the abdomen shows evidence of cirrhosis and changes of portal hypertension with splenomegaly and diffuse ascites. Single gallstone is noted similar to that seen on prior ultrasound examination.  Review of the MIP images confirms the above findings.  IMPRESSION: No evidence of pulmonary  embolism.  Large left pleural effusion with associated atelectasis.  Chronic interstitial changes throughout both lungs stable from the previous exam.  Changes of cirrhosis with portal hypertension and ascites.   Electronically Signed   By: Inez Catalina M.D.   On: 11/19/2014 16:14   Dg Chest Port 1 View  11/19/2014   CLINICAL DATA:  Shortness of breath for  4 months  EXAM: PORTABLE CHEST - 1 VIEW  COMPARISON:  11/08/2014  FINDINGS: Cardiac shadow is stable. Diffuse interstitial changes are again identified bilaterally but slightly worsened in the left lung base likely related to acute on chronic infiltrate. No acute bony abnormality is seen.  IMPRESSION: Acute on chronic infiltrate in the left base.   Electronically Signed   By: Inez Catalina M.D.   On: 11/19/2014 13:59     EKG Interpretation None      MDM   Final diagnoses:  HCAP (healthcare-associated pneumonia)  Sepsis, due to unspecified organism   9 acute on chronic shortness of breath and generalized weakness and hypoxia. Patient admitted for pneumonia last month. States he never got better. He is afebrile. Hypoxic in triage. Oxygenation increased with nasal cannula. He speaking in short sentences. Blood pressure soft in the 90s.  Current sepsis activated. He is afebrile. Labs and cultures obtained. Lactate 2.6. IV fluids given. Chest x-ray shows chronic infiltrate in the left base that is slightly worse. Treat for HCAP.  Patient dyspnea with conversation and actually eating well nasal cannula. ABG shows no CO2 retention. CT scan shows no PE. There is a large left pleural effusion. Patient treated as pneumonia with antibiotics given. INR 1.6 due to his liver disease.  Admission Discussed with Dr. Erasmo Downer. Blood Pressure Remains Soft 90s with Appropriate Heart Rate in the 60s   CRITICAL CARE Performed by: Ezequiel Essex Total critical care time: 45 Critical care time was exclusive of separately billable procedures and treating  other patients. Critical care was necessary to treat or prevent imminent or life-threatening deterioration. Critical care was time spent personally by me on the following activities: development of treatment plan with patient and/or surrogate as well as nursing, discussions with consultants, evaluation of patient's response to treatment, examination of patient, obtaining history from patient or surrogate, ordering and performing treatments and interventions, ordering and review of laboratory studies, ordering and review of radiographic studies, pulse oximetry and re-evaluation of patient's condition.   Ezequiel Essex, MD 11/19/14 [phone]

## 2014-11-19 NOTE — Progress Notes (Signed)
ANTIBIOTIC CONSULT NOTE - INITIAL  Pharmacy Consult for Zosyn and Vancomycin Indication: sepsis  No Known Allergies  Patient Measurements: Weight: 156 lb 4 oz (70.875 kg)  Vital Signs: Temp: 97.7 F (36.5 C) (05/02 1303) Temp Source: Oral (05/02 1303) BP: 96/66 mmHg (05/02 1303) Pulse Rate: 75 (05/02 1303) Intake/Output from previous day:   Intake/Output from this shift:    Labs:  Recent Labs  11/19/14 1310  WBC 3.7*  HGB 9.9*  PLT PENDING   CrCl cannot be calculated (Patient has no serum creatinine result on file.). No results for input(s): VANCOTROUGH, VANCOPEAK, VANCORANDOM, GENTTROUGH, GENTPEAK, GENTRANDOM, TOBRATROUGH, TOBRAPEAK, TOBRARND, AMIKACINPEAK, AMIKACINTROU, AMIKACIN in the last 72 hours.   Microbiology: Recent Results (from the past 720 hour(s))  Body fluid culture     Status: None   Collection Time: 11/16/14 10:58 AM  Result Value Ref Range Status   Specimen Description ASCITIC  Final   Special Requests NONE  Final   Gram Stain   Final    FEW WBC PRESENT, PREDOMINANTLY MONONUCLEAR NO ORGANISMS SEEN Performed at [facility]    Culture   Final    NO GROWTH 3 DAYS Performed at [facility]    Report Status 11/19/2014 FINAL  Final    Medical History: Past Medical History  Diagnosis Date  . Hypertension   . High cholesterol   . Anxiety disorder   . Pancytopenia   . UTI (lower urinary tract infection)   . Depression   . Anxiety   . Leg cramps   . Type II diabetes mellitus   . Liver cirrhosis secondary to NASH   . Infectious hepatitis 1971  . Pneumonia ~ 07/2014; 09/2014    Medications:  See electronic med rec  Assessment: 64 y.o. male presents with SOB and weakness. Noted pt s/p paracentesis on Friday hospitalization ~ 87mos ago for PNA (received Cefepime and Vanc during March admission). To begin broad spectrum antibiotics (Vancomycin and Zosyn) for sepsis. Last SCr 0.9 on 10/22/14, est CrCl 80 ml/min. WBC low at 3.7. LA  2.6.  Noted pt with Vanc trough 16.5 mcg/ml on 1gm IV q12h during last admission.  Goal of Therapy:  Vancomycin trough level 15-20 mcg/ml  Plan:  Zosyn 3.375gm IV now over 30 min then 3.375gm IV q8h - subsequent doses over 4 hours Vancomycin 1gm IV q12h Will f/u new SCr, micro data, and pt's clinical condition  Sherlon Handing, PharmD, BCPS Clinical pharmacist, pager [phone] 11/19/2014,2:06 PM

## 2014-11-19 NOTE — ED Notes (Signed)
Lab results of CG4 of 2.60 reported to [facility].

## 2014-11-19 NOTE — Discharge Instructions (Signed)
Metformin and X-ray Contrast Studies °For some X-ray exams, a contrast dye is used. Contrast dye is a type of medicine used to make the X-ray image clearer. The contrast dye is given to the patient through a vein (intravenously). If you need to have this type of X-ray exam and you take a medication called metformin, your caregiver may have you stop taking metformin before the exam.  °LACTIC ACIDOSIS °In rare cases, a serious medical condition called lactic acidosis can develop in people who take metformin and receive contrast dye. The following conditions can increase the risk of this complication:  °· Kidney failure. °· Liver problems. °· Certain types of heart problems such as: °¨ Heart failure. °¨ Heart attack. °¨ Heart infection. °¨ Heart valve problems. °· Alcohol abuse. °If left untreated, lactic acidosis can lead to coma.  °SYMPTOMS OF LACTIC ACIDOSIS °Symptoms of lactic acidosis can include: °· Rapid breathing (hyperventilation). °· Neurologic symptoms such as: °¨ Headaches. °¨ Confusion. °¨ Dizziness. °· Excessive sweating. °· Feeling sick to your stomach (nauseous) or throwing up (vomiting). °AFTER THE X-RAY EXAM °· Stay well-hydrated. Drink fluids as instructed by your caregiver. °· If you have a risk of developing lactic acidosis, blood tests may be done to make sure your kidney function is okay. °· Metformin is usually stopped for 48 hours after the X-ray exam. Ask your caregiver when you can start taking metformin again. °SEEK MEDICAL CARE IF:  °· You have shortness of breath or difficulty breathing. °· You develop a headache that does not go away. °· You have nausea or vomiting. °· You urinate more than normal. °· You develop a skin rash and have: °¨ Redness. °¨ Swelling. °¨ Itching. °Document Released: 06/24/2009 Document Revised: 09/28/2011 Document Reviewed: 06/24/2009 °[employer]® Patient Information ©2015 [employer]. This information is not intended to replace advice given to you by your health  care provider. Make sure you discuss any questions you have with your health care provider. ° °

## 2014-11-19 NOTE — Progress Notes (Signed)
CRITICAL VALUE ALERT  Critical value received:  Lactic Acid 2.8  Date of notification:  11/19/14  Time of notification: 2032  Critical value read back: YES  Nurse who received alert:  Sherryl Manges, RN, BSN  MD notified (1st page): Schorr, NP  Time of first page:  2036  MD notified (2nd page):   Time of second page:  Responding MD:    Time MD responded:

## 2014-11-19 NOTE — ED Notes (Signed)
Paged Level II code sepsis @13 :47

## 2014-11-19 NOTE — H&P (Signed)
History and Physical  Garrett Turner PNT:[id number] DOB: 1951/06/02 DOA: 11/19/2014  Referring physician: Harlow Mares, ER physician PCP: Lorayne Marek, MD   Chief Complaint: Shortness of breath  HPI: Garrett Turner is a 64 y.o. male  With past medical history of Karlene Lineman cirrhosis, diabetes mellitus and recent discharge from the hospitalist service 5 weeks ago for healthcare associated pneumonia and secondary sepsis complicated by pleural effusion. At that time patient underwent a thoracentesis. He has done well and follow-up and was scheduled to have a MRI of his liver this Friday. Patient has always felt chronically short of breath, but has not needed oxygen. His home health nurse today noted his oxygen saturation was 82% on room air and patient was sent over to the emergency room. In the emergency room, labs were noted for a BNP of 77, lactic acid level of 2.6 and chest x-ray and CT consistent with pneumonia and large left pleural effusion. Patient's blood pressures were borderline soft, and so he was started on IV fluids and broad-spectrum antibiotics. Hospitalists were called for further evaluation. He was treated as a code sepsis  Review of Systems:  Patient seen after arrival to stepdown unit Pt complains of shortness of breath with occasional dry cough and generalized weakness. Pt denies any headaches, vision changes, dysphagia, chest pain, palpitations, abdominal pain, hematuria, dysuria, constipation, diarrhea, focal extremity numbness weakness or pain  Review of systems are otherwise negative  Past Medical History  Diagnosis Date  . Hypertension   . High cholesterol   . Anxiety disorder   . Pancytopenia   . UTI (lower urinary tract infection)   . Depression   . Anxiety   . Leg cramps   . Type II diabetes mellitus   . Liver cirrhosis secondary to NASH   . Infectious hepatitis 1971  . Pneumonia ~ 07/2014; 09/2014   Past Surgical History  Procedure Laterality Date  .  Tonsillectomy    . Esophagogastroduodenoscopy  11/04/2011    Procedure: ESOPHAGOGASTRODUODENOSCOPY (EGD);  Surgeon: Arta Silence, MD;  Location: [facility];  Service: Endoscopy;  Laterality: N/A;  . Colonoscopy  11/04/2011    Procedure: COLONOSCOPY;  Surgeon: Arta Silence, MD;  Location: [facility];  Service: Endoscopy;  Laterality: N/A;  . Vasectomy  1985  . Fixation kyphoplasty lumbar spine      "L5"  . Cardiac catheterization  "years ago"  . Inguinal hernia repair Bilateral 06/2013   Social History:  reports that he has been passively smoking Cigarettes.  He has quit using smokeless tobacco. His smokeless tobacco use included Chew. He reports that he drinks about 0.6 oz of alcohol per week. He reports that he does not use illicit drugs. Patient lives at home by himself & is able to participate in activities of daily living with out assistance but he gets easily winded  No Known Allergies  Family History  Problem Relation Age of Onset  . Coronary artery disease    . Hypertension    . Heart failure Father   . Hypertension Father   . Heart disease Father   . Heart failure Mother   . Hypertension Mother   . Heart disease Mother   . Stroke Paternal Grandmother   . Obesity Daughter      Prior to Admission medications   Medication Sig Start Date End Date Taking? Authorizing Provider  aspirin EC 81 MG EC tablet Take 1 tablet (81 mg total) by mouth daily. 08/24/14  Yes Kinnie Feil, MD  atorvastatin (LIPITOR) 20  MG tablet Take 1 tablet (20 mg total) by mouth daily. 10/22/14  Yes Lorayne Marek, MD  diphenhydrAMINE-zinc acetate (BENADRYL) cream Apply topically 3 (three) times daily as needed for itching. 08/24/14  Yes Kinnie Feil, MD  escitalopram (LEXAPRO) 20 MG tablet Take 1 tablet (20 mg total) by mouth daily. 08/24/14  Yes Kinnie Feil, MD  feeding supplement, ENSURE COMPLETE, (ENSURE COMPLETE) LIQD Take 237 mLs by mouth 2 (two) times daily between meals. 10/09/14  Yes  Albertine Patricia, MD  furosemide (LASIX) 40 MG tablet Take 1 tablet (40 mg total) by mouth daily. Patient taking differently: Take 60 mg by mouth daily.  10/22/14  Yes Lorayne Marek, MD  hydrocortisone (ANUSOL-HC) 2.5 % rectal cream Apply topically 2 (two) times daily. 08/24/14  Yes Kinnie Feil, MD  lactulose (CHRONULAC) 10 GM/15ML solution Take 30 mLs (20 g total) by mouth daily. 10/09/14  Yes Albertine Patricia, MD  metFORMIN (GLUCOPHAGE) 1000 MG tablet Take 1,000 mg by mouth 2 (two) times daily with a meal.   Yes Historical Provider, MD  mirtazapine (REMERON SOL-TAB) 15 MG disintegrating tablet Take 1 tablet (15 mg total) by mouth at bedtime. 08/24/14  Yes Kinnie Feil, MD  propranolol (INDERAL) 10 MG tablet Take 1 tablet (10 mg total) by mouth 2 (two) times daily. 10/22/14  Yes Lorayne Marek, MD  spironolactone (ALDACTONE) 100 MG tablet Take 1 tablet (100 mg total) by mouth daily. Patient taking differently: Take 150 mg by mouth daily.  10/22/14  Yes Lorayne Marek, MD  traMADol (ULTRAM) 50 MG tablet Take 1 tablet (50 mg total) by mouth every 6 (six) hours as needed for moderate pain. 08/24/14  Yes Kinnie Feil, MD    Physical Exam: BP 110/59 mmHg  Pulse 58  Temp(Src) 97.6 F (36.4 C) (Oral)  Resp 20  Ht 6\' 2"  (1.88 m)  Wt 72.8 kg (160 lb 7.9 oz)  BMI 20.60 kg/m2  SpO2 100%  General:  Alert and oriented 3, no acute distress Eyes: Sclera nonicteric, chocolate movements are intact ENT: Normocephalic, atraumatic, mucous remains are slightly dry Neck: No JVD Cardiovascular: Regular rate and rhythm, S1-S2 Respiratory: Decreased breath sounds on the left side, bibasilar crackles Abdomen: Soft, nondistended, nontender, hypoactive bowel sounds Skin: No skin breaks, tears or lesions Musculoskeletal: No clubbing or cyanosis or edema Psychiatric: Patient is appropriate, no evidence of psychoses Neurologic: No focal deficits           Labs on Admission:  Basic Metabolic  Panel:  Recent Labs Lab 11/19/14 1310  NA 135  K 3.9  CL 104  CO2 22  GLUCOSE 178*  BUN 13  CREATININE 1.12  CALCIUM 8.5*   Liver Function Tests:  Recent Labs Lab 11/19/14 1310  AST 48*  ALT 25  ALKPHOS 113  BILITOT 2.6*  PROT 7.2  ALBUMIN 2.4*   No results for input(s): LIPASE, AMYLASE in the last 168 hours. No results for input(s): AMMONIA in the last 168 hours. CBC:  Recent Labs Lab 11/19/14 1310  WBC 3.7*  NEUTROABS 2.5  HGB 9.9*  HCT 29.0*  MCV 92.1  PLT 58*   Cardiac Enzymes: No results for input(s): CKTOTAL, CKMB, CKMBINDEX, TROPONINI in the last 168 hours.  BNP (last 3 results)  Recent Labs  08/14/14 1725 10/04/14 1125 11/19/14 1310  BNP 121.5* 65.8 77.4    ProBNP (last 3 results) No results for input(s): PROBNP in the last 8760 hours.  CBG: No results for input(s): GLUCAP  in the last 168 hours.  Radiological Exams on Admission: Ct Angio Chest Pe W/cm &/or Wo Cm  11/19/2014   CLINICAL DATA:  Shortness of breath and weakness, recent paracentesis  EXAM: CT ANGIOGRAPHY CHEST WITH CONTRAST  TECHNIQUE: Multidetector CT imaging of the chest was performed using the standard protocol during bolus administration of intravenous contrast. Multiplanar CT image reconstructions and MIPs were obtained to evaluate the vascular anatomy.  CONTRAST:  20mL OMNIPAQUE IOHEXOL 350 MG/ML SOLN  COMPARISON:  10/04/2014  FINDINGS: A large left-sided pleural effusion is noted increased in the interval from the prior exam. Diffuse reticular fibrotic changes are noted throughout both lungs stable from the prior study. Left lower lobe atelectasis is noted related to the effusion.  The thoracic inlet is within normal limits. Aortic calcifications are noted without aneurysmal dilatation. The pulmonary artery demonstrates a normal branching pattern. No filling defects to suggest pulmonary emboli are identified. No significant hilar or mediastinal adenopathy is seen. Coronary  calcifications are noted.  Scanning into the abdomen shows evidence of cirrhosis and changes of portal hypertension with splenomegaly and diffuse ascites. Single gallstone is noted similar to that seen on prior ultrasound examination.  Review of the MIP images confirms the above findings.  IMPRESSION: No evidence of pulmonary embolism.  Large left pleural effusion with associated atelectasis.  Chronic interstitial changes throughout both lungs stable from the previous exam.  Changes of cirrhosis with portal hypertension and ascites.   Electronically Signed   By: Inez Catalina M.D.   On: 11/19/2014 16:14   Dg Chest Port 1 View  11/19/2014   CLINICAL DATA:  Shortness of breath for 4 months  EXAM: PORTABLE CHEST - 1 VIEW  COMPARISON:  11/08/2014  FINDINGS: Cardiac shadow is stable. Diffuse interstitial changes are again identified bilaterally but slightly worsened in the left lung base likely related to acute on chronic infiltrate. No acute bony abnormality is seen.  IMPRESSION: Acute on chronic infiltrate in the left base.   Electronically Signed   By: Inez Catalina M.D.   On: 11/19/2014 13:59    EKG: Independently reviewed. Normal sinus rhythm with nonspecific T-wave abnormality  Assessment/Plan Present on Admission:  ) . Sepsis secondary to healthcare associated pneumonia also causing acute  respiratory failure with hypoxia: Patient is criteria given elevated lactic acid level, tachycardia and tachypnea: Fall and lactic acid levels. Continue IV fluids and antibiotics  . Protein-calorie malnutrition, severe: Initiation consult  . Hyperlipidemia . HTN (hypertension): Holding blood pressure medications given concerns for sepsis  . Cirrhosis of liver: Chronic. See below  . Pleural effusion, left: Recurrent, felt to be transudative based on previous notes. We'll discuss with pulmonary about possibility of repeat thoracentesis versus perhaps drain  Consultants: Pulmonary  Code Status: DO NOT  RESUSCITATE  Family Communication: Left message for daughter   Disposition Plan: Continue in stepdown to sepsis felt to be stabilized  Time spent: 63 minutes  [facility] Pager [phone]

## 2014-11-20 ENCOUNTER — Inpatient Hospital Stay (HOSPITAL_COMMUNITY): Payer: Medicare Other

## 2014-11-20 DIAGNOSIS — J9601 Acute respiratory failure with hypoxia: Secondary | ICD-10-CM

## 2014-11-20 DIAGNOSIS — K7469 Other cirrhosis of liver: Secondary | ICD-10-CM

## 2014-11-20 DIAGNOSIS — R0600 Dyspnea, unspecified: Secondary | ICD-10-CM

## 2014-11-20 DIAGNOSIS — E785 Hyperlipidemia, unspecified: Secondary | ICD-10-CM

## 2014-11-20 DIAGNOSIS — J948 Other specified pleural conditions: Secondary | ICD-10-CM

## 2014-11-20 LAB — COMPREHENSIVE METABOLIC PANEL
ALBUMIN: 2 g/dL — AB (ref 3.5–5.0)
ALK PHOS: 95 U/L (ref 38–126)
ALT: 21 U/L (ref 17–63)
ANION GAP: 8 (ref 5–15)
AST: 41 U/L (ref 15–41)
BILIRUBIN TOTAL: 2 mg/dL — AB (ref 0.3–1.2)
BUN: 11 mg/dL (ref 6–20)
CHLORIDE: 107 mmol/L (ref 101–111)
CO2: 19 mmol/L — ABNORMAL LOW (ref 22–32)
Calcium: 7.6 mg/dL — ABNORMAL LOW (ref 8.9–10.3)
Creatinine, Ser: 0.95 mg/dL (ref 0.61–1.24)
GFR calc Af Amer: >60 mL/min (ref >60)
GFR calc non Af Amer: >60 mL/min (ref >60)
Glucose, Bld: 118 mg/dL — ABNORMAL HIGH (ref 70–99)
Potassium: 3.7 mmol/L (ref 3.5–5.1)
SODIUM: 134 mmol/L — AB (ref 135–145)
Total Protein: 5.9 g/dL — ABNORMAL LOW (ref 6.5–8.1)

## 2014-11-20 LAB — GLUCOSE, CAPILLARY
Glucose-Capillary: 193 mg/dL — ABNORMAL HIGH (ref 70–99)
Glucose-Capillary: 114 mg/dL — ABNORMAL HIGH (ref 70–99)
Glucose-Capillary: 184 mg/dL — ABNORMAL HIGH (ref 70–99)
Glucose-Capillary: 187 mg/dL — ABNORMAL HIGH (ref 70–99)
Glucose-Capillary: 159 mg/dL — ABNORMAL HIGH (ref 70–99)

## 2014-11-20 LAB — CBC
HCT: 23.7 % — ABNORMAL LOW (ref 39.0–52.0)
HEMOGLOBIN: 8.3 g/dL — AB (ref 13.0–17.0)
MCH: 31.6 pg (ref 26.0–34.0)
MCHC: 35 g/dL (ref 30.0–36.0)
MCV: 90.1 fL (ref 78.0–100.0)
PLATELETS: 46 10*3/uL — AB (ref 150–400)
RBC: 2.63 MIL/uL — ABNORMAL LOW (ref 4.22–5.81)
RDW: 16.8 % — ABNORMAL HIGH (ref 11.5–15.5)
WBC: 3.3 10*3/uL — AB (ref 4.0–10.5)

## 2014-11-20 LAB — PROTEIN, BODY FLUID

## 2014-11-20 LAB — LACTATE DEHYDROGENASE: LDH: 198 U/L — AB (ref 98–192)

## 2014-11-20 LAB — BODY FLUID CELL COUNT WITH DIFFERENTIAL
Lymphs, Fluid: 84 %
Monocyte-Macrophage-Serous Fluid: 16 % — ABNORMAL LOW (ref 50–90)
WBC FLUID: 578 uL (ref 0–1000)

## 2014-11-20 LAB — LACTATE DEHYDROGENASE, PLEURAL OR PERITONEAL FLUID: LD FL: 88 U/L — AB (ref 3–23)

## 2014-11-20 LAB — LACTIC ACID, PLASMA: Lactic Acid, Venous: 1.4 mmol/L (ref 0.5–2.0)

## 2014-11-20 LAB — PROTEIN, TOTAL: TOTAL PROTEIN: 6.1 g/dL — AB (ref 6.5–8.1)

## 2014-11-20 MED ORDER — CETYLPYRIDINIUM CHLORIDE 0.05 % MT LIQD
7.0000 mL | Freq: Two times a day (BID) | OROMUCOSAL | Status: DC
  Administered 2014-11-20 – 2014-11-22 (×5): 7 mL via OROMUCOSAL

## 2014-11-20 MED ORDER — SPIRONOLACTONE 50 MG PO TABS
50.0000 mg | ORAL_TABLET | Freq: Every day | ORAL | Status: DC
  Administered 2014-11-20: 50 mg via ORAL
  Filled 2014-11-20 (×2): qty 1

## 2014-11-20 MED ORDER — SODIUM CHLORIDE 0.9 % IV SOLN
Freq: Once | INTRAVENOUS | Status: AC
  Administered 2014-11-20: 13:00:00 via INTRAVENOUS

## 2014-11-20 MED ORDER — FUROSEMIDE 20 MG PO TABS
20.0000 mg | ORAL_TABLET | Freq: Every day | ORAL | Status: DC
  Administered 2014-11-20: 20 mg via ORAL
  Filled 2014-11-20 (×2): qty 1

## 2014-11-20 MED ORDER — HYDROCODONE-ACETAMINOPHEN 5-325 MG PO TABS
1.0000 | ORAL_TABLET | Freq: Once | ORAL | Status: AC
  Administered 2014-11-20: 2 via ORAL
  Filled 2014-11-20: qty 2

## 2014-11-20 MED ORDER — HYDROCODONE-ACETAMINOPHEN 5-325 MG PO TABS
1.0000 | ORAL_TABLET | ORAL | Status: DC | PRN
  Administered 2014-11-20 – 2014-11-21 (×2): 2 via ORAL
  Administered 2014-11-21: 1 via ORAL
  Filled 2014-11-20 (×2): qty 2
  Filled 2014-11-20: qty 1
  Filled 2014-11-20: qty 2
  Filled 2014-11-20: qty 1

## 2014-11-20 NOTE — Consult Note (Signed)
Name: Shunsuke Granzow MRN: [id number] DOB: [birth year]    ADMISSION DATE:  11/19/2014 CONSULTATION DATE:  5/3  REFERRING MD :  Sloan Leiter   CHIEF COMPLAINT:  Pleural effusion   BRIEF PATIENT DESCRIPTION:  64 yom  h/o Nash cirrhosis, diabetes mellitus and recent discharge from the hospitalist service 5 weeks prior to admission w/ healthcare associated pneumonia and secondary sepsis. Re-admitted 5/2 w/ acute on chronic dyspnea and working dx of possible HCAP w/acute on chronic pleural effusion. PCCM asked to see re: effusion.   SIGNIFICANT EVENTS    STUDIES:  CT chest 5/2: No evidence of pulmonary embolism.Large left pleural effusion with associated atelectasis.Chronic interstitial changes throughout both lungs stable. Changes of cirrhosis with portal hypertension and ascites.   HISTORY OF PRESENT ILLNESS:  64 y.o.m w/ h/o Nash cirrhosis, diabetes mellitus and recent discharge from the hospitalist service 5 weeks prior to admission w/ healthcare associated pneumonia and secondary sepsis.  Since d/c to home he has had progressive dyspnea. Underwent therapeutic paracentesis on 4/29 in hopes that his dyspnea would improve. About 2.5 liters were taken off. He had no significant improvement. Over the following days his dyspnea continued to worsen.  On 5/2 he was being seen by his home health RN who noted his oxygen saturation was 82% on room air and patient was sent over to the emergency room. In the emergency room, labs were noted for a BNP of 77, lactic acid level of 2.6 and chest x-ray and CT consistent with pneumonia and large left pleural effusion. Patient's blood pressures were borderline soft, and so he was started on IV fluids and broad-spectrum antibiotics. He was admitted by the Hospitalists PCCM asked evaluate re: pleural effusion.   PAST MEDICAL HISTORY :   has a past medical history of Hypertension; High cholesterol; Anxiety disorder; Pancytopenia; UTI (lower urinary tract infection);  Depression; Anxiety; Leg cramps; Type II diabetes mellitus; Liver cirrhosis secondary to NASH; Infectious hepatitis (1971); and Pneumonia (~ 07/2014; 09/2014).  has past surgical history that includes Tonsillectomy; Esophagogastroduodenoscopy (11/04/2011); Colonoscopy (11/04/2011); Vasectomy (1985); Fixation kyphoplasty lumbar spine; Cardiac catheterization ("years ago"); and Inguinal hernia repair (Bilateral, 06/2013). Prior to Admission medications   Medication Sig Start Date End Date Taking? Authorizing Provider  aspirin EC 81 MG EC tablet Take 1 tablet (81 mg total) by mouth daily. 08/24/14  Yes Kinnie Feil, MD  atorvastatin (LIPITOR) 20 MG tablet Take 1 tablet (20 mg total) by mouth daily. 10/22/14  Yes Lorayne Marek, MD  diphenhydrAMINE-zinc acetate (BENADRYL) cream Apply topically 3 (three) times daily as needed for itching. 08/24/14  Yes Kinnie Feil, MD  escitalopram (LEXAPRO) 20 MG tablet Take 1 tablet (20 mg total) by mouth daily. 08/24/14  Yes Kinnie Feil, MD  feeding supplement, ENSURE COMPLETE, (ENSURE COMPLETE) LIQD Take 237 mLs by mouth 2 (two) times daily between meals. 10/09/14  Yes Albertine Patricia, MD  furosemide (LASIX) 40 MG tablet Take 1 tablet (40 mg total) by mouth daily. Patient taking differently: Take 60 mg by mouth daily.  10/22/14  Yes Lorayne Marek, MD  hydrocortisone (ANUSOL-HC) 2.5 % rectal cream Apply topically 2 (two) times daily. 08/24/14  Yes Kinnie Feil, MD  lactulose (CHRONULAC) 10 GM/15ML solution Take 30 mLs (20 g total) by mouth daily. 10/09/14  Yes Albertine Patricia, MD  metFORMIN (GLUCOPHAGE) 1000 MG tablet Take 1,000 mg by mouth 2 (two) times daily with a meal.   Yes Historical Provider, MD  mirtazapine (REMERON SOL-TAB) 15 MG disintegrating  tablet Take 1 tablet (15 mg total) by mouth at bedtime. 08/24/14  Yes Kinnie Feil, MD  propranolol (INDERAL) 10 MG tablet Take 1 tablet (10 mg total) by mouth 2 (two) times daily. 10/22/14  Yes Lorayne Marek, MD    spironolactone (ALDACTONE) 100 MG tablet Take 1 tablet (100 mg total) by mouth daily. Patient taking differently: Take 150 mg by mouth daily.  10/22/14  Yes Lorayne Marek, MD  traMADol (ULTRAM) 50 MG tablet Take 1 tablet (50 mg total) by mouth every 6 (six) hours as needed for moderate pain. 08/24/14  Yes Kinnie Feil, MD   No Known Allergies  FAMILY HISTORY:  family history includes Coronary artery disease in an other family member; Heart disease in his father and mother; Heart failure in his father and mother; Hypertension in his father, mother, and another family member; Obesity in his daughter; Stroke in his paternal grandmother. SOCIAL HISTORY:  reports that he has been passively smoking Cigarettes.  He has quit using smokeless tobacco. His smokeless tobacco use included Chew. He reports that he drinks about 0.6 oz of alcohol per week. He reports that he does not use illicit drugs.  REVIEW OF SYSTEMS:   Constitutional: Negative for fever, chills, weight loss, malaise +fatigue and diaphoresis.  HENT: Negative for hearing loss, ear pain, nosebleeds, congestion, sore throat, neck pain, tinnitus and ear discharge.   Eyes: Negative for blurred vision, double vision, photophobia, pain, discharge and redness.  Respiratory: Non-productive cough, hemoptysis, no sputum production, + progressive shortness of breath, wheezing and stridor.   Cardiovascular: Negative for chest pain, palpitations, orthopnea, claudication, leg swelling and PND.  Gastrointestinal: Negative for heartburn, nausea, vomiting, abdominal pain, diarrhea, constipation, blood in stool and melena.  Genitourinary: Negative for dysuria, urgency, frequency, hematuria and flank pain.  Musculoskeletal: Negative for myalgias, back pain, joint pain and falls.  Skin: Negative for itching and rash.  Neurological: Negative for dizziness, tingling, tremors, sensory change, speech change, focal weakness, seizures, loss of consciousness,  weakness and headaches.  Endo/Heme/Allergies: Negative for environmental allergies and polydipsia. Does not bruise/bleed easily.  SUBJECTIVE:  SOB w/ any exertion   VITAL SIGNS: Temp:  [97.6 F (36.4 C)-98.6 F (37 C)] 98.2 F (36.8 C) (05/03 0850) Pulse Rate:  [58-75] 64 (05/03 0850) Resp:  [13-23] 16 (05/03 0850) BP: (93-111)/(44-69) 111/64 mmHg (05/03 0850) SpO2:  [86 %-100 %] 98 % (05/03 0850) Weight:  [70.875 kg (156 lb 4 oz)-72.8 kg (160 lb 7.9 oz)] 72.8 kg (160 lb 7.9 oz) (05/02 1654)  PHYSICAL EXAMINATION: General:  Chronically ill appearing white male, not in acute distress. But is SOB talking > 4 word phrases  Neuro:  Awake, oriented, no focal def  HEENT:  [place], no JVd  Cardiovascular:  rrr w/out MRG Lungs:  Decreased Left no accessory muscle use  Abdomen:  Soft, non tender. + bowel sounds  Musculoskeletal:  Intact  Skin:  Intact    Recent Labs Lab 11/19/14 1310 11/20/14 0337  NA 135 134*  K 3.9 3.7  CL 104 107  CO2 22 19*  BUN 13 11  CREATININE 1.12 0.95  GLUCOSE 178* 118*    Recent Labs Lab 11/19/14 1310 11/20/14 0337  HGB 9.9* 8.3*  HCT 29.0* 23.7*  WBC 3.7* 3.3*  PLT 58* 46*   Lab Results  Component Value Date   INR 1.66* 11/19/2014   INR 1.66* 10/04/2014   INR 1.58* 10/04/2014    Recent Labs Lab 11/19/14 1310 11/19/14 1352 11/19/14 1920  11/20/14 0337  PROCALCITON  --   --  <0.10  --   WBC 3.7*  --   --  3.3*  LATICACIDVEN  --  2.60* 2.8* 1.4       Ct Angio Chest Pe W/cm &/or Wo Cm  11/19/2014   CLINICAL DATA:  Shortness of breath and weakness, recent paracentesis  EXAM: CT ANGIOGRAPHY CHEST WITH CONTRAST  TECHNIQUE: Multidetector CT imaging of the chest was performed using the standard protocol during bolus administration of intravenous contrast. Multiplanar CT image reconstructions and MIPs were obtained to evaluate the vascular anatomy.  CONTRAST:  61mL OMNIPAQUE IOHEXOL 350 MG/ML SOLN  COMPARISON:  10/04/2014  FINDINGS: A large  left-sided pleural effusion is noted increased in the interval from the prior exam. Diffuse reticular fibrotic changes are noted throughout both lungs stable from the prior study. Left lower lobe atelectasis is noted related to the effusion.  The thoracic inlet is within normal limits. Aortic calcifications are noted without aneurysmal dilatation. The pulmonary artery demonstrates a normal branching pattern. No filling defects to suggest pulmonary emboli are identified. No significant hilar or mediastinal adenopathy is seen. Coronary calcifications are noted.  Scanning into the abdomen shows evidence of cirrhosis and changes of portal hypertension with splenomegaly and diffuse ascites. Single gallstone is noted similar to that seen on prior ultrasound examination.  Review of the MIP images confirms the above findings.  IMPRESSION: No evidence of pulmonary embolism.  Large left pleural effusion with associated atelectasis.  Chronic interstitial changes throughout both lungs stable from the previous exam.  Changes of cirrhosis with portal hypertension and ascites.   Electronically Signed   By: Inez Catalina M.D.   On: 11/19/2014 16:14   Dg Chest Port 1 View  11/19/2014   CLINICAL DATA:  Shortness of breath for 4 months  EXAM: PORTABLE CHEST - 1 VIEW  COMPARISON:  11/08/2014  FINDINGS: Cardiac shadow is stable. Diffuse interstitial changes are again identified bilaterally but slightly worsened in the left lung base likely related to acute on chronic infiltrate. No acute bony abnormality is seen.  IMPRESSION: Acute on chronic infiltrate in the left base.   Electronically Signed   By: Inez Catalina M.D.   On: 11/19/2014 13:59    ASSESSMENT / PLAN:  Acute hypoxic respiratory failure in setting LLL ATX vs HCAP w/ associated large left effusion.  >Not convinced that this is infection, more likely hepato-thorax,  but w/ large effusion and concern for sepsis this warrants fluid analysis. Think that he would benefit from  thoracentesis from a symptom burden stand-point as well. PCT was normal.   Plan Diagnostic/therapeutic left thoracentesis.. Will send fluid analysis for diagnostics  PLTs are [place], would prefer >50K. Tranfuse 1 unit Platelets prior to thora.  Cont current abx pulm hygiene Will need to re-assess diuretic regimen when BP improves   H/o RUL Pulmonary Nodule. Not noted on this CT or last  Plan F/u hi resolution CT chest 12 months   Sepsis vs volume depletion  Lactic acid has cleared.  Plan Hold antihypertensives and diuretics for now Cont abx   Thrombocytopenia  Plan Transfuse prior to thora.   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  Plan Trend INR  thora should be ok as long as INR < 2  NAG metabolic acidosis Plan Per IM service  Cirrhosis of lover in setting of NASH Plan Per IM service  Erick Colace ACNP-BC [place] Pager # [phone] OR # [phone] if no answer  11/20/2014, 9:51 AM

## 2014-11-20 NOTE — Progress Notes (Addendum)
Initial Nutrition Assessment  DOCUMENTATION CODES:  Severe malnutrition in context of chronic illness  INTERVENTION:  Ensure Enlive (each supplement provides 350kcal and 20 grams of protein)   NUTRITION DIAGNOSIS:  Increased nutrient needs related to chronic illness as evidenced by estimated needs.  GOAL:  Patient will meet greater than or equal to 90% of their needs  MONITOR:  PO intake, Supplement acceptance, Labs, Weight trends, I & O's  REASON FOR ASSESSMENT:  Consult Assessment of nutrition requirement/status  ASSESSMENT: 64 yo Make with PMH of nash cirrhosis, diabetes mellitus; home health nurse noted his oxygen saturation was 82% on room air and patient was sent over to the emergency room. In the emergency room, labs were noted for a BNP of 77, lactic acid level of 2.6 and chest x-ray and CT consistent with pneumonia and large left pleural effusion.    Patient reports a decreased appetite.  Not happy with his dinner meal last night (says his chicken was not cooked).  Pt lives alone and has difficulty cooking for himself.  Drinks oral nutrition supplements at home (unable to recall name/brand).  + visible severe muscle and fat depletion.  Has had severe weight loss (14%) since February 2016.    Pt seen per Clinical Nutrition during previous hospital admission March of 2016.  Malnutrition was identified and is ongoing.  Ensure Enlive supplements ordered BID per MD this AM.  Height:  Ht Readings from Last 1 Encounters:  11/19/14 6\' 2"  (1.88 m)    Weight:  Wt Readings from Last 1 Encounters:  11/19/14 160 lb 7.9 oz (72.8 kg)    Ideal Body Weight:  86.3 kg  Wt Readings from Last 10 Encounters:  11/19/14 160 lb 7.9 oz (72.8 kg)  11/09/14 168 lb 9.6 oz (76.476 kg)  10/22/14 178 lb 3.2 oz (80.831 kg)  10/09/14 170 lb 6.4 oz (77.293 kg)  10/04/14 161 lb 12.8 oz (73.392 kg)  09/12/14 164 lb (74.39 kg)  08/30/14 186 lb (84.369 kg)  08/28/14 186 lb (84.369 kg)   08/15/14 186 lb 11.2 oz (84.687 kg)  06/19/14 197 lb 9.6 oz (89.631 kg)    BMI:  Body mass index is 20.6 kg/(m^2).  Estimated Nutritional Needs:  Kcal:  1800-2000  Protein:  90-100 gm  Fluid:  1.8-2.0 L  Skin:  Reviewed, no issues  Diet Order:  Diet Carb Modified Fluid consistency:: Thin; Room service appropriate?: Yes  EDUCATION NEEDS:  No education needs identified at this time   Intake/Output Summary (Last 24 hours) at 11/20/14 1456 Last data filed at 11/20/14 0619  Gross per 24 hour  Intake 1612.5 ml  Output    500 ml  Net 1112.5 ml    Last BM:  unknown  Arthur Holms, RD, LDN Pager #: [phone] After-Hours Pager #: [phone]

## 2014-11-20 NOTE — Progress Notes (Signed)
PATIENT DETAILS Name: Garrett Turner Age: 64 y.o. Sex: male Date of Birth: 13-Nov-1950 Admit Date: 11/19/2014 Admitting Physician Annita Brod, MD [id number], Vernon Prey, MD  Subjective: Short of breath with minimal activity. Unchanged since admission  Assessment/Plan: Active Problems:   Acute hypoxic respiratory failure: Due to large left-sided pleural effusion. VQ scan low probability for pulmonary embolism. Have consulted PCCM for thoracocentesis.    Left-sided pleural effusion: Causing above. Suspect this is from underlying liver cirrhosis. BP soft-Will restart Aldactone/Lasix at half of prior dosing. Stop IV fluids. Have consulted PCCM for thoracocentesis.    ? Healthcare associated pneumonia: CT scan personally reviewed-suspect more of atelectasis than pneumonia. For now. Continue with empiric antibiotics for now, we will rapidly narrow depending on culture data and clinical course.    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  Restarted Aldactone and Lasix at half of the usual dosing. No major cytosine on exam-been does not appear tense. Continue to follow and optimize accordingly.    Thrombocytopenia: Secondary to underlying liver cirrhosis. May need to stop aspirin at some point. Follow.    Mild coagulopathy: Secondary to liver cirrhosis. Follow.    Severe protein calorie malnutrition: Await nutrition eval, continue with feeding supplement    Dyslipidemia: Continue statin    Type 2 diabetes: CBG currently controlled with SSI, resume metformin on discharge  Disposition: Remain inpatient-remain in SDU  Antimicrobial agents  See below  Anti-infectives    Start     Dose/Rate Route Frequency Ordered Stop   11/20/14 0400  vancomycin (VANCOCIN) IVPB 1000 mg/200 mL premix     1,000 mg 200 mL/hr over 60 Minutes Intravenous Every 12 hours 11/19/14 1417     11/19/14 2200  piperacillin-tazobactam (ZOSYN) IVPB 3.375 g     3.375 g 12.5 mL/hr over 240 Minutes Intravenous 3 times per day 11/19/14 1417     11/19/14 1400  piperacillin-tazobactam (ZOSYN) IVPB 3.375 g     3.375 g 100 mL/hr over 30 Minutes Intravenous  Once 11/19/14 1347 11/19/14 1511   11/19/14 1400  vancomycin (VANCOCIN) IVPB 1000 mg/200 mL premix     1,000 mg 200 mL/hr over 60 Minutes Intravenous  Once 11/19/14 1347 11/19/14 1715      DVT Prophylaxis:  SCD's  Code Status:  DNR-reconfirmed with patient at bedside   Family Communication None at bedside-patient awake/alert and understanding of plan  Procedures: None  CONSULTS:  pulmonary/intensive care  Time spent 40 minutes-which includes 50% of the time with face-to-face with patient/ family and coordinating care related to the above assessment and plan.  MEDICATIONS: Scheduled Meds: . antiseptic oral rinse  7 mL Mouth Rinse BID  . aspirin EC  81 mg Oral Daily  . atorvastatin  20 mg Oral q1800  . feeding supplement (ENSURE ENLIVE)  237 mL Oral BID BM  . furosemide  20 mg Oral Daily  . insulin aspart  0-20 Units Subcutaneous TID WC  . insulin aspart  0-5 Units Subcutaneous QHS  . lactulose  20 g Oral Daily  . piperacillin-tazobactam (ZOSYN)  IV  3.375 g Intravenous 3 times per day  . sodium chloride  3 mL Intravenous Q12H  . spironolactone  50 mg Oral Daily  . vancomycin  1,000 mg Intravenous Q12H   Continuous Infusions:  PRN Meds:.ondansetron **OR** ondansetron (ZOFRAN) IV    PHYSICAL EXAM: Vital signs in last 24 hours: Filed Vitals:   11/20/14  1000 11/20/14 1158 11/20/14 1236 11/20/14 1251  BP: 101/57 91/42 102/51 92/50  Pulse: 73 72 69 69  Temp:  98.3 F (36.8 C) 98.4 F (36.9 C) 98.4 F (36.9 C)  TempSrc:  Oral Oral Oral  Resp: 20 18 16 16   Height:      Weight:      SpO2: 96% 96% 95% 95%    Weight change:  Filed Weights   11/19/14 1303 11/19/14 1654  Weight: 70.875 kg (156 lb 4 oz) 72.8 kg (160 lb 7.9 oz)   Body mass index is 20.6 kg/(m^2).   Gen  Exam: Awake and alert with clear speech. mild distress-but easily speaking in full sentences Neck: Supple, No JVD.   Chest: Poor entry on the left side-rales heard on the right side CVS: S1 S2 Regular, no murmurs.  Abdomen: soft, BS +, non tender,mildly distended-but not tense  Extremities: no edema, lower extremities warm to touch. Neurologic: Non Focal.   Skin: No Rash.   Wounds: N/A.   Intake/Output from previous day:  Intake/Output Summary (Last 24 hours) at 11/20/14 1327 Last data filed at 11/20/14 0619  Gross per 24 hour  Intake 1612.5 ml  Output    500 ml  Net 1112.5 ml     LAB RESULTS: CBC  Recent Labs Lab 11/19/14 1310 11/20/14 0337  WBC 3.7* 3.3*  HGB 9.9* 8.3*  HCT 29.0* 23.7*  PLT 58* 46*  MCV 92.1 90.1  MCH 31.4 31.6  MCHC 34.1 35.0  RDW 16.8* 16.8*  LYMPHSABS 0.5*  --   MONOABS 0.4  --   EOSABS 0.2  --   BASOSABS 0.0  --     Chemistries   Recent Labs Lab 11/19/14 1310 11/20/14 0337  NA 135 134*  K 3.9 3.7  CL 104 107  CO2 22 19*  GLUCOSE 178* 118*  BUN 13 11  CREATININE 1.12 0.95  CALCIUM 8.5* 7.6*    CBG:  Recent Labs Lab 11/19/14 1309 11/19/14 2212 11/20/14 0819 11/20/14 1300  GLUCAP 170* 193* 114* 184*    GFR Estimated Creatinine Clearance: 79.8 mL/min (by C-G formula based on Cr of 0.95).  Coagulation profile  Recent Labs Lab 11/19/14 1310  INR 1.66*    Cardiac Enzymes No results for input(s): CKMB, TROPONINI, MYOGLOBIN in the last 168 hours.  Invalid input(s): CK  Invalid input(s): POCBNP No results for input(s): DDIMER in the last 72 hours. No results for input(s): HGBA1C in the last 72 hours. No results for input(s): CHOL, HDL, LDLCALC, TRIG, CHOLHDL, LDLDIRECT in the last 72 hours. No results for input(s): TSH, T4TOTAL, T3FREE, THYROIDAB in the last 72 hours.  Invalid input(s): FREET3 No results for input(s): VITAMINB12, FOLATE, FERRITIN, TIBC, IRON, RETICCTPCT in the last 72 hours. No results for  input(s): LIPASE, AMYLASE in the last 72 hours.  Urine Studies No results for input(s): UHGB, CRYS in the last 72 hours.  Invalid input(s): UACOL, UAPR, USPG, UPH, UTP, UGL, UKET, UBIL, UNIT, UROB, ULEU, UEPI, UWBC, URBC, UBAC, CAST, UCOM, BILUA  MICROBIOLOGY: Recent Results (from the past 240 hour(s))  Body fluid culture     Status: None   Collection Time: 11/16/14 10:58 AM  Result Value Ref Range Status   Specimen Description ASCITIC  Final   Special Requests NONE  Final   Gram Stain   Final    FEW WBC PRESENT, PREDOMINANTLY MONONUCLEAR NO ORGANISMS SEEN Performed at [facility]    Culture   Final    NO  GROWTH 3 DAYS Performed at [facility]    Report Status 11/19/2014 FINAL  Final  Culture, blood (routine x 2)     Status: None (Preliminary result)   Collection Time: 11/19/14  1:20 PM  Result Value Ref Range Status   Specimen Description BLOOD LEFT ARM  Final   Special Requests BOTTLES DRAWN AEROBIC AND ANAEROBIC 5MLS  Final   Culture   Final           BLOOD CULTURE RECEIVED NO GROWTH TO DATE CULTURE WILL BE HELD FOR 5 DAYS BEFORE ISSUING A FINAL NEGATIVE REPORT Performed at [facility]    Report Status PENDING  Incomplete  Culture, blood (routine x 2)     Status: None (Preliminary result)   Collection Time: 11/19/14  1:30 PM  Result Value Ref Range Status   Specimen Description BLOOD RIGHT ARM  Final   Special Requests BOTTLES DRAWN AEROBIC AND ANAEROBIC 5ML  Final   Culture   Final           BLOOD CULTURE RECEIVED NO GROWTH TO DATE CULTURE WILL BE HELD FOR 5 DAYS BEFORE ISSUING A FINAL NEGATIVE REPORT Performed at [facility]    Report Status PENDING  Incomplete  MRSA PCR Screening     Status: None   Collection Time: 11/19/14  5:16 PM  Result Value Ref Range Status   MRSA by PCR NEGATIVE NEGATIVE Final    Comment:        The GeneXpert MRSA Assay (FDA approved for NASAL specimens only), is one component of a comprehensive MRSA  colonization surveillance program. It is not intended to diagnose MRSA infection nor to guide or monitor treatment for MRSA infections.     RADIOLOGY STUDIES/RESULTS: Dg Chest 2 View  11/08/2014   CLINICAL DATA:  Two month history of shortness of breath, diagnosis of pneumonia and pleurisy in March of 2016, persistent shortness of breath and generalized weakness.  EXAM: CHEST  2 VIEW  COMPARISON:  Portable chest x-ray of October 06, 2014  FINDINGS: The lungs are borderline hypoinflated. The interstitial markings remain increased bilaterally. There is no there is a lingular infiltrate. There small left pleural effusion. The cardiac silhouette is mildly enlarged. The pulmonary vascularity is not engorged. The bony thorax exhibits no acute abnormality. The patient has undergone previous kyphoplasty at T12  IMPRESSION: The study is limited due to mild hypo inflation. There remain increased interstitial markings bilaterally which may be chronic. There is subtle lingular infiltrate and small left pleural effusion. There been interval improvement especially on the left since October 06, 2014.   Electronically Signed   By: David  Martinique M.D.   On: 11/08/2014 10:33   Ct Angio Chest Pe W/cm &/or Wo Cm  11/19/2014   CLINICAL DATA:  Shortness of breath and weakness, recent paracentesis  EXAM: CT ANGIOGRAPHY CHEST WITH CONTRAST  TECHNIQUE: Multidetector CT imaging of the chest was performed using the standard protocol during bolus administration of intravenous contrast. Multiplanar CT image reconstructions and MIPs were obtained to evaluate the vascular anatomy.  CONTRAST:  6mL OMNIPAQUE IOHEXOL 350 MG/ML SOLN  COMPARISON:  10/04/2014  FINDINGS: A large left-sided pleural effusion is noted increased in the interval from the prior exam. Diffuse reticular fibrotic changes are noted throughout both lungs stable from the prior study. Left lower lobe atelectasis is noted related to the effusion.  The thoracic inlet is  within normal limits. Aortic calcifications are noted without aneurysmal dilatation. The pulmonary artery demonstrates  a normal branching pattern. No filling defects to suggest pulmonary emboli are identified. No significant hilar or mediastinal adenopathy is seen. Coronary calcifications are noted.  Scanning into the abdomen shows evidence of cirrhosis and changes of portal hypertension with splenomegaly and diffuse ascites. Single gallstone is noted similar to that seen on prior ultrasound examination.  Review of the MIP images confirms the above findings.  IMPRESSION: No evidence of pulmonary embolism.  Large left pleural effusion with associated atelectasis.  Chronic interstitial changes throughout both lungs stable from the previous exam.  Changes of cirrhosis with portal hypertension and ascites.   Electronically Signed   By: Inez Catalina M.D.   On: 11/19/2014 16:14   US Abdomen Complete  11/08/2014   CLINICAL DATA:  Cirrhosis, history of ascites  EXAM: ULTRASOUND ABDOMEN COMPLETE  COMPARISON:  The gallbladder is adequately distended. There is an 8 mm stone near the neck. There is mild wall thickening to 10 mm. There is no positive sonographic Murphy's sign. There is a large volume of ascites adjacent the gallbladder.  FINDINGS: Gallbladder: 3.7 mm  Common bile duct: Diameter: 3.7 mm  Liver: The liver is shrunken and exhibits irregular surface contour. The echotexture is heterogeneous and there is a a focal area of increased echogenicity measures 4 x 4.2 x 4.3 cm.  IVC: No abnormality visualized.  Pancreas: Evaluation of the pancreatic head and tail is limited by bowel gas.  Spleen: Splenomegaly with calculated volume of 496 cc.  Right Kidney: Length: 10.7 cm. Echogenicity within normal limits. No mass or hydronephrosis visualized.  Left Kidney: Length: 11.5 cm. Echogenicity within normal limits. No mass or hydronephrosis visualized.  Abdominal aorta: No aneurysm visualized.  Other findings: Moderate to  large volume of ascites is present.  IMPRESSION: 1. Hepatic cirrhosis with large volume of ascites. There is a focal area of increased echogenicity within or adjacent to the liver that may be artifactual. However, hepatic protocol MRI or CT scanning is recommended to exclude an underlying mass. 2. Gallstones without acute cholecystitis. 3. Splenomegaly.   Electronically Signed   By: David  Martinique M.D.   On: 11/08/2014 11:38   US Paracentesis  11/16/2014   INDICATION: Primary biliary cirrhosis, recurrent ascites. Request is made for diagnostic and therapeutic paracentesis.  EXAM: ULTRASOUND-GUIDED DIAGNOSTIC AND THERAPEUTIC PARACENTESIS  COMPARISON:  Prior paracentesis on 08/16/2014  MEDICATIONS: None.  COMPLICATIONS: None immediate  TECHNIQUE: Informed written consent was obtained from the patient after a discussion of the risks, benefits and alternatives to treatment. A timeout was performed prior to the initiation of the procedure.  Initial ultrasound scanning demonstrates a moderate amount of ascites within the right upper abdominal quadrant. The right upper abdomen was prepped and draped in the usual sterile fashion. 1% lidocaine was used for local anesthesia. Under direct ultrasound guidance, a 19 gauge, 7-cm, Yueh catheter was introduced. An ultrasound image was saved for documentation purposed. The paracentesis was performed. The catheter was removed and a dressing was applied. The patient tolerated the procedure well without immediate post procedural complication.  FINDINGS: A total of approximately 2.6 liters of turbid,yellow fluid was removed. Samples were sent to the laboratory as requested by the clinical team.  IMPRESSION: Successful ultrasound-guided diagnostic and therapeutic paracentesis yielding 2.6 liters of peritoneal fluid. The patient will receive IV albumin infusion postprocedure.  Read by: Rowe Robert, PA-C   Electronically Signed   By: Jerilynn Mages.  Shick M.D.   On: 11/16/2014 12:05   Dg Chest  Port 1 View  11/19/2014  CLINICAL DATA:  Shortness of breath for 4 months  EXAM: PORTABLE CHEST - 1 VIEW  COMPARISON:  11/08/2014  FINDINGS: Cardiac shadow is stable. Diffuse interstitial changes are again identified bilaterally but slightly worsened in the left lung base likely related to acute on chronic infiltrate. No acute bony abnormality is seen.  IMPRESSION: Acute on chronic infiltrate in the left base.   Electronically Signed   By: Inez Catalina M.D.   On: 11/19/2014 13:59    Oren Binet, MD  [facility] Pager:[phone]  If 7PM-7AM, please contact night-coverage [email or web address] Password [email or web address] 11/20/2014, 1:27 PM   LOS: 1 day

## 2014-11-20 NOTE — Progress Notes (Signed)
Utilization Review Completed.  

## 2014-11-20 NOTE — Procedures (Signed)
Thoracentesis Procedure Note  Pre-operative Diagnosis: pleural effusion  Post-operative Diagnosis: same  Indications: dyspnea and evaluation of pleural fluid   Procedure Details  Consent: Informed consent was obtained. Risks of the procedure were discussed including: infection, bleeding, pain, pneumothorax.  Under sterile conditions the patient was positioned. Betadine solution and sterile drapes were utilized.  1% buffered lidocaine was used to anesthetize the pleural space; which was identified via real time [place]. Fluid was obtained without any difficulties and minimal blood loss.  A dressing was applied to the wound and wound care instructions were provided.   Findings 1000 ml of cloudy orange tinged pleural fluid was obtained. A sample was sent to Pathology for cytogenetics, flow, and cell counts, as well as for infection analysis.  Complications:  None; patient tolerated the procedure well.          Condition: stable  Plan A follow up chest x-ray was ordered. Bed Rest for 0 hours. Tylenol 650 mg. for pain.  Baltazar Apo, MD, PhD 11/20/2014, 3:44 PM [facility] [phone] or if no answer [phone]

## 2014-11-21 ENCOUNTER — Inpatient Hospital Stay (HOSPITAL_COMMUNITY): Payer: Medicare Other

## 2014-11-21 DIAGNOSIS — K7030 Alcoholic cirrhosis of liver without ascites: Secondary | ICD-10-CM

## 2014-11-21 DIAGNOSIS — I10 Essential (primary) hypertension: Secondary | ICD-10-CM

## 2014-11-21 LAB — COMPREHENSIVE METABOLIC PANEL
ALT: 23 U/L (ref 17–63)
AST: 50 U/L — AB (ref 15–41)
Albumin: 2.3 g/dL — ABNORMAL LOW (ref 3.5–5.0)
Alkaline Phosphatase: 105 U/L (ref 38–126)
Anion gap: 7 (ref 5–15)
BUN: 14 mg/dL (ref 6–20)
CHLORIDE: 106 mmol/L (ref 101–111)
CO2: 19 mmol/L — ABNORMAL LOW (ref 22–32)
Calcium: 7.9 mg/dL — ABNORMAL LOW (ref 8.9–10.3)
Creatinine, Ser: 0.99 mg/dL (ref 0.61–1.24)
GFR calc Af Amer: >60 mL/min (ref >60)
GFR calc non Af Amer: >60 mL/min (ref >60)
GLUCOSE: 117 mg/dL — AB (ref 70–99)
Potassium: 4 mmol/L (ref 3.5–5.1)
SODIUM: 132 mmol/L — AB (ref 135–145)
TOTAL PROTEIN: 6.3 g/dL — AB (ref 6.5–8.1)
Total Bilirubin: 2.4 mg/dL — ABNORMAL HIGH (ref 0.3–1.2)

## 2014-11-21 LAB — CBC
HEMATOCRIT: 25.3 % — AB (ref 39.0–52.0)
HEMOGLOBIN: 8.7 g/dL — AB (ref 13.0–17.0)
MCH: 31.2 pg (ref 26.0–34.0)
MCHC: 34.4 g/dL (ref 30.0–36.0)
MCV: 90.7 fL (ref 78.0–100.0)
Platelets: 69 10*3/uL — ABNORMAL LOW (ref 150–400)
RBC: 2.79 MIL/uL — ABNORMAL LOW (ref 4.22–5.81)
RDW: 16.5 % — AB (ref 11.5–15.5)
WBC: 3.4 10*3/uL — AB (ref 4.0–10.5)

## 2014-11-21 LAB — PREPARE PLATELET PHERESIS: UNIT DIVISION: 0

## 2014-11-21 LAB — GLUCOSE, CAPILLARY
GLUCOSE-CAPILLARY: 117 mg/dL — AB (ref 70–99)
Glucose-Capillary: 169 mg/dL — ABNORMAL HIGH (ref 70–99)
Glucose-Capillary: 109 mg/dL — ABNORMAL HIGH (ref 70–99)
Glucose-Capillary: 164 mg/dL — ABNORMAL HIGH (ref 70–99)

## 2014-11-21 MED ORDER — LEVOFLOXACIN 750 MG PO TABS
750.0000 mg | ORAL_TABLET | Freq: Every day | ORAL | Status: DC
  Administered 2014-11-21 – 2014-11-23 (×3): 750 mg via ORAL
  Filled 2014-11-21 (×4): qty 1

## 2014-11-21 MED ORDER — SPIRONOLACTONE 100 MG PO TABS
100.0000 mg | ORAL_TABLET | Freq: Every day | ORAL | Status: DC
  Administered 2014-11-21: 100 mg via ORAL
  Filled 2014-11-21 (×3): qty 1

## 2014-11-21 MED ORDER — FUROSEMIDE 40 MG PO TABS
40.0000 mg | ORAL_TABLET | Freq: Every day | ORAL | Status: DC
  Administered 2014-11-21 – 2014-11-22 (×2): 40 mg via ORAL
  Filled 2014-11-21 (×3): qty 1

## 2014-11-21 MED ORDER — FUROSEMIDE 40 MG PO TABS
40.0000 mg | ORAL_TABLET | Freq: Every day | ORAL | Status: DC
  Filled 2014-11-21: qty 1

## 2014-11-21 MED ORDER — GADOXETATE DISODIUM 0.25 MMOL/ML IV SOLN
10.0000 mL | Freq: Once | INTRAVENOUS | Status: AC | PRN
  Administered 2014-11-21: 10 mL via INTRAVENOUS

## 2014-11-21 NOTE — Evaluation (Signed)
Physical Therapy Evaluation Patient Details Name: Garrett Turner MRN: [id number] DOB: 07/19/1951 Today's Date: 11/21/2014   History of Present Illness  Pt is a Garrett Turner male with past medical history of Garrett Turner cirrhosis, diabetes mellitus and recent discharge from the hospitalist service 5 weeks ago for healthcare associated pneumonia and secondary sepsis complicated by pleural effusion. At that time patient underwent a thoracentesis. He has done well and follow-up and was scheduled to have a MRI of his liver this Friday. Patient has always felt chronically short of breath, but has not needed oxygen. His home health nurse today noted his oxygen saturation was 82% on room air and patient was sent over to the emergency room. In the emergency room, labs were noted for a BNP of 77, lactic acid level of 2.6 and chest x-ray and CT consistent with pneumonia and large left pleural effusion. Patient's blood pressures were borderline soft, and so he was started on IV fluids and broad-spectrum antibiotics. Hospitalists were called for further evaluation. He was treated as a code sepsis  Clinical Impression  Pt admitted with above diagnosis. Pt currently with functional limitations due to the deficits listed below (see PT Problem List). Pt will benefit from HHPT f/u.  Desat with activity.  Continue PT acutely.  Pt will benefit from skilled PT to increase their independence and safety with mobility to allow discharge to the venue listed below.      Follow Up Recommendations Home health PT;Supervision - Intermittent    Equipment Recommendations  None recommended by PT    Recommendations for Other Services       Precautions / Restrictions Precautions Precautions: Fall Restrictions Weight Bearing Restrictions: No      Mobility  Bed Mobility Overal bed mobility: Needs Assistance Bed Mobility: Supine to Sit     Supine to sit: Min assist     General bed mobility comments: Assist for  LEs.  Transfers Overall transfer level: Needs assistance Equipment used: None Transfers: Sit to/from [facility] Sit to Stand: Min assist Stand pivot transfers: Min assist       General transfer comment: Needed HHA bil for pivot transfer.  Reliance on hands for supprort.Desat to 87% on RA with transfer but recovered to 93% within a few minutes.   Ambulation/Gait                Stairs            Wheelchair Mobility    Modified Rankin (Stroke Patients Only)       Balance Overall balance assessment: Needs assistance         Standing balance support: No upper extremity supported;During functional activity Standing balance-Leahy Scale: Fair Standing balance comment: can stand statically wthout UE support for up to minute                             Pertinent Vitals/Pain Pain Assessment: No/denies pain  Desat to 87% on RA with transfer only with DOE 3/4.  Pt needed to rest to get sat to 94%. Other VSS.     Home Living Family/patient expects to be discharged to:: Private residence Living Arrangements: Alone Available Help at Discharge: Family;Friend(s);Neighbor;Available PRN/intermittently (daughter, friend, and neighbor assist patient) Type of Home: House Home Access: Level entry     Home Layout: One level Home Equipment: Walker - 2 wheels;Grab bars - toilet;Grab bars - tub/shower;Hand held shower head;Shower seat      Prior Function Level  of Independence: Independent with assistive device(s)               Hand Dominance   Dominant Hand: Right    Extremity/Trunk Assessment   Upper Extremity Assessment: Defer to OT evaluation           Lower Extremity Assessment: Generalized weakness      Cervical / Trunk Assessment: Kyphotic  Communication   Communication: No difficulties  Cognition Arousal/Alertness: Awake/alert Behavior During Therapy: WFL for tasks assessed/performed Overall Cognitive Status: Within  Functional Limits for tasks assessed                      General Comments      Exercises General Exercises - Lower Extremity Ankle Circles/Pumps: AROM;Both;15 reps;Seated Long Arc Quad: AROM;Both;10 reps;Seated      Assessment/Plan    PT Assessment Patient needs continued PT services  PT Diagnosis Generalized weakness   PT Problem List Decreased balance;Decreased mobility;Decreased activity tolerance;Decreased knowledge of use of DME;Decreased safety awareness;Decreased knowledge of precautions  PT Treatment Interventions DME instruction;Gait training;Functional mobility training;Therapeutic activities;Therapeutic exercise;Balance training;Patient/family education   PT Goals (Current goals can be found in the Care Plan section) Acute Rehab PT Goals Patient Stated Goal: to go home PT Goal Formulation: With patient Time For Goal Achievement: 12/05/14 Potential to Achieve Goals: Good    Frequency Min 3X/week   Barriers to discharge        Co-evaluation               End of Session Equipment Utilized During Treatment: Gait belt Activity Tolerance: Patient limited by fatigue Patient left: in chair;with call bell/phone within reach Nurse Communication: Mobility status         Time: 1022-1040 PT Time Calculation (min) (ACUTE ONLY): 18 min   Charges:   PT Evaluation $Initial PT Evaluation Tier I: 1 Procedure     PT G CodesDenice Paradise 2014-12-19, 12:15 PM [facility] Acute Rehabilitation [phone] (pager)

## 2014-11-21 NOTE — Clinical Documentation Improvement (Signed)
Progress notes with documented thrombocytopenia.  Other labs as below.  Please identify any additional clinical conditions associated with the abnormal labs below and document in your progress notes and carry over to the discharge summary.    Component      WBC RBC Hemoglobin  Latest Ref Rng      4.0 - 10.5 K/uL 4.22 - 5.81 MIL/uL 13.0 - 17.0 g/dL  11/19/2014     1:10 PM 3.7 (L) 3.15 (L) 9.9 (L)  11/20/2014     3:37 AM 3.3 (L) 2.63 (L) 8.3 (L)  11/21/2014     3:31 AM 3.4 (L) 2.79 (L) 8.7 (L)   Component      HCT  Latest Ref Rng      39.0 - 52.0 %  11/19/2014     1:10 PM 29.0 (L)  11/20/2014     3:37 AM 23.7 (L)  11/21/2014     3:31 AM 25.3 (L)    Possible Clinical Conditions: -Pancytopenia -Anemia (if present, please specify acuity / type) -Other condition -Unable to determine  Thank you, Mateo Flow, RN [phone] Clinical Documentation Specialist

## 2014-11-21 NOTE — Progress Notes (Signed)
ANTIBIOTIC CONSULT NOTE - FOLLOW UP  Pharmacy Consult for levaquin Indication: PNA  No Known Allergies  Patient Measurements: Height: 6\' 2"  (188 cm) Weight: 160 lb 7.9 oz (72.8 kg) IBW/kg (Calculated) : 82.2  Vital Signs: Temp: 98.2 F (36.8 C) (05/04 0800) Temp Source: Oral (05/04 0800) BP: 96/59 mmHg (05/04 0800) Pulse Rate: 80 (05/04 0800) Intake/Output from previous day: 05/03 0701 - 05/04 0700 In: 760 [Blood:210; IV Piggyback:550] Out: 500 [Urine:500] Intake/Output from this shift: Total I/O In: 360 [P.O.:360] Out: 200 [Urine:200]  Labs:  Recent Labs  11/19/14 1310 11/20/14 0337 11/21/14 0331  WBC 3.7* 3.3* 3.4*  HGB 9.9* 8.3* 8.7*  PLT 58* 46* 69*  CREATININE 1.12 0.95 0.99   Estimated Creatinine Clearance: 76.6 mL/min (by C-G formula based on Cr of 0.99). No results for input(s): VANCOTROUGH, VANCOPEAK, VANCORANDOM, GENTTROUGH, GENTPEAK, GENTRANDOM, TOBRATROUGH, TOBRAPEAK, TOBRARND, AMIKACINPEAK, AMIKACINTROU, AMIKACIN in the last 72 hours.   Microbiology: Recent Results (from the past 720 hour(s))  Body fluid culture     Status: None   Collection Time: 11/16/14 10:58 AM  Result Value Ref Range Status   Specimen Description ASCITIC  Final   Special Requests NONE  Final   Gram Stain   Final    FEW WBC PRESENT, PREDOMINANTLY MONONUCLEAR NO ORGANISMS SEEN Performed at [facility]    Culture   Final    NO GROWTH 3 DAYS Performed at [facility]    Report Status 11/19/2014 FINAL  Final  Culture, blood (routine x 2)     Status: None (Preliminary result)   Collection Time: 11/19/14  1:20 PM  Result Value Ref Range Status   Specimen Description BLOOD LEFT ARM  Final   Special Requests BOTTLES DRAWN AEROBIC AND ANAEROBIC 5MLS  Final   Culture   Final           BLOOD CULTURE RECEIVED NO GROWTH TO DATE CULTURE WILL BE HELD FOR 5 DAYS BEFORE ISSUING A FINAL NEGATIVE REPORT Performed at [facility]    Report Status PENDING   Incomplete  Culture, blood (routine x 2)     Status: None (Preliminary result)   Collection Time: 11/19/14  1:30 PM  Result Value Ref Range Status   Specimen Description BLOOD RIGHT ARM  Final   Special Requests BOTTLES DRAWN AEROBIC AND ANAEROBIC 5ML  Final   Culture   Final           BLOOD CULTURE RECEIVED NO GROWTH TO DATE CULTURE WILL BE HELD FOR 5 DAYS BEFORE ISSUING A FINAL NEGATIVE REPORT Performed at [facility]    Report Status PENDING  Incomplete  MRSA PCR Screening     Status: None   Collection Time: 11/19/14  5:16 PM  Result Value Ref Range Status   MRSA by PCR NEGATIVE NEGATIVE Final    Comment:        The GeneXpert MRSA Assay (FDA approved for NASAL specimens only), is one component of a comprehensive MRSA colonization surveillance program. It is not intended to diagnose MRSA infection nor to guide or monitor treatment for MRSA infections.   Body fluid culture     Status: None (Preliminary result)   Collection Time: 11/20/14  2:15 PM  Result Value Ref Range Status   Specimen Description FLUID LEFT PLEURAL  Final   Special Requests Normal  Final   Gram Stain   Final    NO WBC SEEN NO SQUAMOUS EPITHELIAL CELLS SEEN NO ORGANISMS SEEN Performed at [facility]    Culture NO GROWTH Performed at [facility]   Final   Report Status PENDING  Incomplete    Anti-infectives    Start     Dose/Rate Route Frequency Ordered Stop   11/20/14 0400  vancomycin (VANCOCIN) IVPB 1000 mg/200 mL premix  Status:  Discontinued     1,000 mg 200 mL/hr over 60 Minutes Intravenous Every 12 hours 11/19/14 1417 11/21/14 1127   11/19/14 2200  piperacillin-tazobactam (ZOSYN) IVPB 3.375 g  Status:  Discontinued     3.375 g 12.5 mL/hr over 240 Minutes Intravenous 3 times per day 11/19/14 1417 11/21/14 1127   11/19/14 1400  piperacillin-tazobactam (ZOSYN) IVPB 3.375 g     3.375 g 100 mL/hr over 30 Minutes Intravenous  Once 11/19/14 1347 11/19/14 1511    11/19/14 1400  vancomycin (VANCOCIN) IVPB 1000 mg/200 mL premix     1,000 mg 200 mL/hr over 60 Minutes Intravenous  Once 11/19/14 1347 11/19/14 1715      Assessment: 64 yo male on vancomycin and zosyn for possible HCAP and to transition to levaquin. WBC= 3.4, afebrile, SCr= 0.99 and CrCl ~ 75. Patient is tolerating po medications.   5/4 levaquin>> 5/2 Vanc>>5/4 5/2 Zosyn>>5/4  5/3 pleural fluid- ngtd 5/2 Bld x2>> ngtd   Plan:  -Levaquin 750mg  po q24h -Will follow renal function, cultures and clinical progress  Hildred Laser, Pharm D 11/21/2014 11:58 AM

## 2014-11-21 NOTE — Progress Notes (Signed)
PATIENT DETAILS Name: Garrett Turner Age: 64 y.o. Sex: male Date of Birth: August 26, 1950 Admit Date: 11/19/2014 Admitting Physician Annita Brod, MD [id number], Vernon Prey, MD  Subjective: Short of breath is much better.  Assessment/Plan: Active Problems:   Acute hypoxic respiratory failure: Due to large left-sided pleural effusion. VQ scan low probability for pulmonary embolism. Underwent thoracocentesis on 5/3. Much better post thoracocentesis. Taper off oxygen    Left-sided pleural effusion: Causing above. Suspect this is from underlying liver cirrhosis. Underwent thoracocentesis, reviewed pleural fluid analysis- consistent with a transudate. Increase Aldactone to 100 mg, Lasix to 40 mg. Follow.     ? Healthcare associated pneumonia: CT scan personally reviewed-suspect more of atelectasis than pneumonia. On admission started on empiric vancomycin and Zosyn, blood cultures negative so far, we will discontinue vancomycin/Zosyn on 5/4 and just transitioned to levofloxacin for a few more days. 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  Restarted Aldactone and Lasix at half of the usual dosing. No major ascites on exam-abdomen does not appear tense. Continue to follow. Back on usual dosing of diuretics.    Thrombocytopenia: Secondary to underlying liver cirrhosis. May need to stop aspirin at some point. Follow.    Mild coagulopathy: Secondary to liver cirrhosis. Follow.    Severe protein calorie malnutrition:  continue with feeding supplement    Dyslipidemia: Continue statin    Type 2 diabetes: CBG currently controlled with SSI, resume metformin on discharge     Mild hyponatremia: Suspect secondary to ascites and volume overload. Stable, on diuretics.  Disposition: Remain transfer to MedSurg unit. Suspect home in the next few days-await physical therapy  evaluation  Antimicrobial agents  See below  Anti-infectives    Start     Dose/Rate Route Frequency Ordered Stop   11/20/14 0400  vancomycin (VANCOCIN) IVPB 1000 mg/200 mL premix     1,000 mg 200 mL/hr over 60 Minutes Intravenous Every 12 hours 11/19/14 1417     11/19/14 2200  piperacillin-tazobactam (ZOSYN) IVPB 3.375 g     3.375 g 12.5 mL/hr over 240 Minutes Intravenous 3 times per day 11/19/14 1417     11/19/14 1400  piperacillin-tazobactam (ZOSYN) IVPB 3.375 g     3.375 g 100 mL/hr over 30 Minutes Intravenous  Once 11/19/14 1347 11/19/14 1511   11/19/14 1400  vancomycin (VANCOCIN) IVPB 1000 mg/200 mL premix     1,000 mg 200 mL/hr over 60 Minutes Intravenous  Once 11/19/14 1347 11/19/14 1715      DVT Prophylaxis:  SCD's  Code Status:  DNR-reconfirmed with patient at bedside   Family Communication None at bedside-patient awake/alert and understanding of plan  Procedures: None  CONSULTS:  pulmonary/intensive care  Time spent 35 minutes-which includes 50% of the time with face-to-face with patient/ family and coordinating care related to the above assessment and plan.  MEDICATIONS: Scheduled Meds: . antiseptic oral rinse  7 mL Mouth Rinse BID  . aspirin EC  81 mg Oral Daily  . atorvastatin  20 mg Oral q1800  . feeding supplement (ENSURE ENLIVE)  237 mL Oral BID BM  . furosemide  40 mg Oral Daily  . insulin aspart  0-20 Units Subcutaneous TID WC  . insulin aspart  0-5 Units Subcutaneous QHS  . lactulose  20 g Oral Daily  . piperacillin-tazobactam (ZOSYN)  IV  3.375 g Intravenous 3 times per day  .  sodium chloride  3 mL Intravenous Q12H  . spironolactone  100 mg Oral Daily  . vancomycin  1,000 mg Intravenous Q12H   Continuous Infusions:  PRN Meds:.HYDROcodone-acetaminophen, ondansetron **OR** ondansetron (ZOFRAN) IV    PHYSICAL EXAM: Vital signs in last 24 hours: Filed Vitals:   11/20/14 2325 11/21/14 0000 11/21/14 0347 11/21/14 0800  BP: 88/53 106/57  108/51 96/59  Pulse: 85 76 79 80  Temp: 98.4 F (36.9 C)  99 F (37.2 C) 98.2 F (36.8 C)  TempSrc: Oral  Oral Oral  Resp: 21 16 17 12   Height:      Weight:      SpO2: 96% 94% 95% 94%    Weight change:  Filed Weights   11/19/14 1303 11/19/14 1654  Weight: 70.875 kg (156 lb 4 oz) 72.8 kg (160 lb 7.9 oz)   Body mass index is 20.6 kg/(m^2).   Gen Exam: Awake and alert with clear speech. mild distress-but easily speaking in full sentences Neck: Supple, No JVD.   Chest: Poor entry on the left side-rales heard on the right side CVS: S1 S2 Regular, no murmurs.  Abdomen: soft, BS +, non tender,mildly distended-but not tense  Extremities: no edema, lower extremities warm to touch. Neurologic: Non Focal.   Skin: No Rash.   Wounds: N/A.   Intake/Output from previous day:  Intake/Output Summary (Last 24 hours) at 11/21/14 1119 Last data filed at 11/21/14 0900  Gross per 24 hour  Intake   1120 ml  Output    700 ml  Net    420 ml     LAB RESULTS: CBC  Recent Labs Lab 11/19/14 1310 11/20/14 0337 11/21/14 0331  WBC 3.7* 3.3* 3.4*  HGB 9.9* 8.3* 8.7*  HCT 29.0* 23.7* 25.3*  PLT 58* 46* 69*  MCV 92.1 90.1 90.7  MCH 31.4 31.6 31.2  MCHC 34.1 35.0 34.4  RDW 16.8* 16.8* 16.5*  LYMPHSABS 0.5*  --   --   MONOABS 0.4  --   --   EOSABS 0.2  --   --   BASOSABS 0.0  --   --     Chemistries   Recent Labs Lab 11/19/14 1310 11/20/14 0337 11/21/14 0331  NA 135 134* 132*  K 3.9 3.7 4.0  CL 104 107 106  CO2 22 19* 19*  GLUCOSE 178* 118* 117*  BUN 13 11 14   CREATININE 1.12 0.95 0.99  CALCIUM 8.5* 7.6* 7.9*    CBG:  Recent Labs Lab 11/20/14 0819 11/20/14 1300 11/20/14 1721 11/20/14 2158 11/21/14 0820  GLUCAP 114* 184* 187* 159* 117*    GFR Estimated Creatinine Clearance: 76.6 mL/min (by C-G formula based on Cr of 0.99).  Coagulation profile  Recent Labs Lab 11/19/14 1310  INR 1.66*    Cardiac Enzymes No results for input(s): CKMB, TROPONINI,  MYOGLOBIN in the last 168 hours.  Invalid input(s): CK  Invalid input(s): POCBNP No results for input(s): DDIMER in the last 72 hours. No results for input(s): HGBA1C in the last 72 hours. No results for input(s): CHOL, HDL, LDLCALC, TRIG, CHOLHDL, LDLDIRECT in the last 72 hours. No results for input(s): TSH, T4TOTAL, T3FREE, THYROIDAB in the last 72 hours.  Invalid input(s): FREET3 No results for input(s): VITAMINB12, FOLATE, FERRITIN, TIBC, IRON, RETICCTPCT in the last 72 hours. No results for input(s): LIPASE, AMYLASE in the last 72 hours.  Urine Studies No results for input(s): UHGB, CRYS in the last 72 hours.  Invalid input(s): UACOL, UAPR, USPG, UPH, UTP, UGL, UKET,  UBIL, UNIT, UROB, ULEU, UEPI, UWBC, URBC, UBAC, CAST, UCOM, BILUA  MICROBIOLOGY: Recent Results (from the past 240 hour(s))  Body fluid culture     Status: None   Collection Time: 11/16/14 10:58 AM  Result Value Ref Range Status   Specimen Description ASCITIC  Final   Special Requests NONE  Final   Gram Stain   Final    FEW WBC PRESENT, PREDOMINANTLY MONONUCLEAR NO ORGANISMS SEEN Performed at [facility]    Culture   Final    NO GROWTH 3 DAYS Performed at [facility]    Report Status 11/19/2014 FINAL  Final  Culture, blood (routine x 2)     Status: None (Preliminary result)   Collection Time: 11/19/14  1:20 PM  Result Value Ref Range Status   Specimen Description BLOOD LEFT ARM  Final   Special Requests BOTTLES DRAWN AEROBIC AND ANAEROBIC 5MLS  Final   Culture   Final           BLOOD CULTURE RECEIVED NO GROWTH TO DATE CULTURE WILL BE HELD FOR 5 DAYS BEFORE ISSUING A FINAL NEGATIVE REPORT Performed at [facility]    Report Status PENDING  Incomplete  Culture, blood (routine x 2)     Status: None (Preliminary result)   Collection Time: 11/19/14  1:30 PM  Result Value Ref Range Status   Specimen Description BLOOD RIGHT ARM  Final   Special Requests BOTTLES DRAWN AEROBIC AND  ANAEROBIC 5ML  Final   Culture   Final           BLOOD CULTURE RECEIVED NO GROWTH TO DATE CULTURE WILL BE HELD FOR 5 DAYS BEFORE ISSUING A FINAL NEGATIVE REPORT Performed at [facility]    Report Status PENDING  Incomplete  MRSA PCR Screening     Status: None   Collection Time: 11/19/14  5:16 PM  Result Value Ref Range Status   MRSA by PCR NEGATIVE NEGATIVE Final    Comment:        The GeneXpert MRSA Assay (FDA approved for NASAL specimens only), is one component of a comprehensive MRSA colonization surveillance program. It is not intended to diagnose MRSA infection nor to guide or monitor treatment for MRSA infections.   Body fluid culture     Status: None (Preliminary result)   Collection Time: 11/20/14  2:15 PM  Result Value Ref Range Status   Specimen Description FLUID LEFT PLEURAL  Final   Special Requests Normal  Final   Gram Stain   Final    NO WBC SEEN NO SQUAMOUS EPITHELIAL CELLS SEEN NO ORGANISMS SEEN Performed at [facility]    Culture NO GROWTH Performed at [facility]   Final   Report Status PENDING  Incomplete    RADIOLOGY STUDIES/RESULTS: Dg Chest 2 View  11/08/2014   CLINICAL DATA:  Two month history of shortness of breath, diagnosis of pneumonia and pleurisy in March of 2016, persistent shortness of breath and generalized weakness.  EXAM: CHEST  2 VIEW  COMPARISON:  Portable chest x-ray of October 06, 2014  FINDINGS: The lungs are borderline hypoinflated. The interstitial markings remain increased bilaterally. There is no there is a lingular infiltrate. There small left pleural effusion. The cardiac silhouette is mildly enlarged. The pulmonary vascularity is not engorged. The bony thorax exhibits no acute abnormality. The patient has undergone previous kyphoplasty at T12  IMPRESSION: The study is limited due to mild hypo inflation. There remain increased interstitial markings bilaterally  which may be chronic. There is subtle lingular  infiltrate and small left pleural effusion. There been interval improvement especially on the left since October 06, 2014.   Electronically Signed   By: David  Martinique M.D.   On: 11/08/2014 10:33   Ct Angio Chest Pe W/cm &/or Wo Cm  11/19/2014   CLINICAL DATA:  Shortness of breath and weakness, recent paracentesis  EXAM: CT ANGIOGRAPHY CHEST WITH CONTRAST  TECHNIQUE: Multidetector CT imaging of the chest was performed using the standard protocol during bolus administration of intravenous contrast. Multiplanar CT image reconstructions and MIPs were obtained to evaluate the vascular anatomy.  CONTRAST:  48mL OMNIPAQUE IOHEXOL 350 MG/ML SOLN  COMPARISON:  10/04/2014  FINDINGS: A large left-sided pleural effusion is noted increased in the interval from the prior exam. Diffuse reticular fibrotic changes are noted throughout both lungs stable from the prior study. Left lower lobe atelectasis is noted related to the effusion.  The thoracic inlet is within normal limits. Aortic calcifications are noted without aneurysmal dilatation. The pulmonary artery demonstrates a normal branching pattern. No filling defects to suggest pulmonary emboli are identified. No significant hilar or mediastinal adenopathy is seen. Coronary calcifications are noted.  Scanning into the abdomen shows evidence of cirrhosis and changes of portal hypertension with splenomegaly and diffuse ascites. Single gallstone is noted similar to that seen on prior ultrasound examination.  Review of the MIP images confirms the above findings.  IMPRESSION: No evidence of pulmonary embolism.  Large left pleural effusion with associated atelectasis.  Chronic interstitial changes throughout both lungs stable from the previous exam.  Changes of cirrhosis with portal hypertension and ascites.   Electronically Signed   By: Inez Catalina M.D.   On: 11/19/2014 16:14   US Abdomen Complete  11/08/2014   CLINICAL DATA:  Cirrhosis, history of ascites  EXAM: ULTRASOUND  ABDOMEN COMPLETE  COMPARISON:  The gallbladder is adequately distended. There is an 8 mm stone near the neck. There is mild wall thickening to 10 mm. There is no positive sonographic Murphy's sign. There is a large volume of ascites adjacent the gallbladder.  FINDINGS: Gallbladder: 3.7 mm  Common bile duct: Diameter: 3.7 mm  Liver: The liver is shrunken and exhibits irregular surface contour. The echotexture is heterogeneous and there is a a focal area of increased echogenicity measures 4 x 4.2 x 4.3 cm.  IVC: No abnormality visualized.  Pancreas: Evaluation of the pancreatic head and tail is limited by bowel gas.  Spleen: Splenomegaly with calculated volume of 496 cc.  Right Kidney: Length: 10.7 cm. Echogenicity within normal limits. No mass or hydronephrosis visualized.  Left Kidney: Length: 11.5 cm. Echogenicity within normal limits. No mass or hydronephrosis visualized.  Abdominal aorta: No aneurysm visualized.  Other findings: Moderate to large volume of ascites is present.  IMPRESSION: 1. Hepatic cirrhosis with large volume of ascites. There is a focal area of increased echogenicity within or adjacent to the liver that may be artifactual. However, hepatic protocol MRI or CT scanning is recommended to exclude an underlying mass. 2. Gallstones without acute cholecystitis. 3. Splenomegaly.   Electronically Signed   By: David  Martinique M.D.   On: 11/08/2014 11:38   US Paracentesis  11/16/2014   INDICATION: Primary biliary cirrhosis, recurrent ascites. Request is made for diagnostic and therapeutic paracentesis.  EXAM: ULTRASOUND-GUIDED DIAGNOSTIC AND THERAPEUTIC PARACENTESIS  COMPARISON:  Prior paracentesis on 08/16/2014  MEDICATIONS: None.  COMPLICATIONS: None immediate  TECHNIQUE: Informed written consent was obtained from the patient after  a discussion of the risks, benefits and alternatives to treatment. A timeout was performed prior to the initiation of the procedure.  Initial ultrasound scanning  demonstrates a moderate amount of ascites within the right upper abdominal quadrant. The right upper abdomen was prepped and draped in the usual sterile fashion. 1% lidocaine was used for local anesthesia. Under direct ultrasound guidance, a 19 gauge, 7-cm, Yueh catheter was introduced. An ultrasound image was saved for documentation purposed. The paracentesis was performed. The catheter was removed and a dressing was applied. The patient tolerated the procedure well without immediate post procedural complication.  FINDINGS: A total of approximately 2.6 liters of turbid,yellow fluid was removed. Samples were sent to the laboratory as requested by the clinical team.  IMPRESSION: Successful ultrasound-guided diagnostic and therapeutic paracentesis yielding 2.6 liters of peritoneal fluid. The patient will receive IV albumin infusion postprocedure.  Read by: Rowe Robert, PA-C   Electronically Signed   By: Jerilynn Mages.  Shick M.D.   On: 11/16/2014 12:05   Dg Chest Port 1 View  11/20/2014   CLINICAL DATA:  Status post left thoracentesis  EXAM: PORTABLE CHEST - 1 VIEW  COMPARISON:  11/19/2014  FINDINGS: Cardiac shadow is stable. Left thoracentesis has been performed without evidence of pneumothorax. No focal infiltrate is seen. The bony structures are within normal limits.  IMPRESSION: No evidence of pneumothorax following left thoracentesis.   Electronically Signed   By: Inez Catalina M.D.   On: 11/20/2014 15:03   Dg Chest Port 1 View  11/19/2014   CLINICAL DATA:  Shortness of breath for 4 months  EXAM: PORTABLE CHEST - 1 VIEW  COMPARISON:  11/08/2014  FINDINGS: Cardiac shadow is stable. Diffuse interstitial changes are again identified bilaterally but slightly worsened in the left lung base likely related to acute on chronic infiltrate. No acute bony abnormality is seen.  IMPRESSION: Acute on chronic infiltrate in the left base.   Electronically Signed   By: Inez Catalina M.D.   On: 11/19/2014 13:59    Oren Binet,  MD  [facility] Pager:[phone]  If 7PM-7AM, please contact night-coverage [email or web address] Password [email or web address] 11/21/2014, 11:19 AM   LOS: 2 days

## 2014-11-22 LAB — URINALYSIS, ROUTINE W REFLEX MICROSCOPIC
BILIRUBIN URINE: NEGATIVE
GLUCOSE, UA: NEGATIVE mg/dL
HGB URINE DIPSTICK: NEGATIVE
Ketones, ur: NEGATIVE mg/dL
Leukocytes, UA: NEGATIVE
NITRITE: NEGATIVE
PH: 6.5 (ref 5.0–8.0)
Protein, ur: NEGATIVE mg/dL
SPECIFIC GRAVITY, URINE: 1.006 (ref 1.005–1.030)
Urobilinogen, UA: 1 mg/dL (ref 0.0–1.0)

## 2014-11-22 LAB — CBC
HCT: 25 % — ABNORMAL LOW (ref 39.0–52.0)
HEMOGLOBIN: 8.8 g/dL — AB (ref 13.0–17.0)
MCH: 32.4 pg (ref 26.0–34.0)
MCHC: 35.2 g/dL (ref 30.0–36.0)
MCV: 91.9 fL (ref 78.0–100.0)
Platelets: 55 10*3/uL — ABNORMAL LOW (ref 150–400)
RBC: 2.72 MIL/uL — ABNORMAL LOW (ref 4.22–5.81)
RDW: 16.7 % — ABNORMAL HIGH (ref 11.5–15.5)
WBC: 2.8 10*3/uL — ABNORMAL LOW (ref 4.0–10.5)

## 2014-11-22 LAB — GLUCOSE, CAPILLARY
GLUCOSE-CAPILLARY: 115 mg/dL — AB (ref 70–99)
GLUCOSE-CAPILLARY: 140 mg/dL — AB (ref 70–99)
GLUCOSE-CAPILLARY: 210 mg/dL — AB (ref 70–99)
Glucose-Capillary: 208 mg/dL — ABNORMAL HIGH (ref 70–99)

## 2014-11-22 MED ORDER — SPIRONOLACTONE 100 MG PO TABS
100.0000 mg | ORAL_TABLET | Freq: Two times a day (BID) | ORAL | Status: DC
  Administered 2014-11-22 – 2014-11-23 (×3): 100 mg via ORAL
  Filled 2014-11-22 (×4): qty 1

## 2014-11-22 NOTE — Care Management Note (Signed)
    Page 1 of 1   11/22/2014     9:46:35 AM CARE MANAGEMENT NOTE 11/22/2014  Patient:  Garrett Turner, Garrett Turner   Account Number:  [id number]  Date Initiated:  11/22/2014  Documentation initiated by:  Zian Delair  Subjective/Objective Assessment:   dx PNA; lives alone, has walker and shower seat, active with [facility] for RN, PT, OT, aide    PCP  Lorayne Marek     Anticipated DC Date:  11/23/2014   Anticipated DC Plan:  [place]  CM consult      [facility] Choice  Resumption Of Svcs/PTA Provider   [place] arranged  HH-1 RN  [place] agency  [place]   Status of service:  In process, will continue to follow Medicare Important Message given?  YES (If response is "NO", the following Medicare IM given date fields will be blank) Date Medicare IM given:  11/22/2014 Medicare IM given by:  Kelyse Pask Date Additional Medicare IM given:   Additional Medicare IM given by:    Per UR Regulation:  Reviewed for med. necessity/level of care/duration of stay

## 2014-11-22 NOTE — Progress Notes (Signed)
Arrived to room 6n03 from [facility].  Instructed on usage of call system, oriented to room and surroundings, daughterr at bedside. Denies pain/ nausea at this time

## 2014-11-22 NOTE — Clinical Social Work Note (Signed)
CSW received consult for SNF placement, case manager and bedside nurse informed this CSW that patient may be ready to discharge tomorrow.  Patient has agreed to go to SNF and has been faxed out.  Patient requests to go to [facility] if possible, CSW assessment to follow.  Jones Broom. [place], MSW, [place] 11/22/2014 4:18 PM

## 2014-11-22 NOTE — Progress Notes (Signed)
Physical Therapy Treatment Patient Details Name: Garrett Turner MRN: [id number] DOB: [birth year] Today's Date: 11/22/2014    History of Present Illness Pt is a Garrett Turner male with past medical history of Garrett Turner cirrhosis, diabetes mellitus and recent discharge from the hospitalist service 5 weeks ago for healthcare associated pneumonia and secondary sepsis complicated by pleural effusion. At that time patient underwent a thoracentesis. He has done well and follow-up and was scheduled to have a MRI of his liver this Friday. Patient has always felt chronically short of breath, but has not needed oxygen. His home health nurse today noted his oxygen saturation was 82% on room air and patient was sent over to the emergency room. In the emergency room, labs were noted for a BNP of 77, lactic acid level of 2.6 and chest x-ray and CT consistent with pneumonia and large left pleural effusion. Patient's blood pressures were borderline soft, and so he was started on IV fluids and broad-spectrum antibiotics. Hospitalists were called for further evaluation. He was treated as a code sepsis    PT Comments    Pt admitted with above diagnosis. Pt currently with functional limitations due to balance and endurance deficits. Pt desats with any activity.  LOB with dynamic activity.  Will not have help at home.  Will need NHP for rehab due to poor endurance and decr safety.  Continue acute PT.   Pt will benefit from skilled PT to increase their independence and safety with mobility to allow discharge to the venue listed below.    Follow Up Recommendations  SNF;Supervision/Assistance - 24 hour     Equipment Recommendations  None recommended by PT    Recommendations for Other Services       Precautions / Restrictions Precautions Precautions: Fall Restrictions Weight Bearing Restrictions: No    Mobility  Bed Mobility               General bed mobility comments: in chair on arrival  Transfers Overall  transfer level: Needs assistance Equipment used: Rolling walker (2 wheeled) Transfers: Sit to/from Stand Sit to Stand: Min guard         General transfer comment: Needed cues to hand placement.  Pt steady upon rising and used urinal in standing with min guard asssit.  Pt had just walked wwith Nursing so didn't want to walk again.  Desat to 86% on RA just standing using urinal.  Recovered to 94% on RA with 2 min of rest.    Ambulation/Gait                 Stairs            Wheelchair Mobility    Modified Rankin (Stroke Patients Only)       Balance Overall balance assessment: Needs assistance;History of Falls         Standing balance support: No upper extremity supported;During functional activity Standing balance-Leahy Scale: Fair Standing balance comment: can stand statically without UE support for up to minute.                    Cognition Arousal/Alertness: Awake/alert Behavior During Therapy: WFL for tasks assessed/performed Overall Cognitive Status: Within Functional Limits for tasks assessed                      Exercises General Exercises - Lower Extremity Ankle Circles/Pumps: AROM;Both;15 reps;Seated Long Arc Quad: AROM;Both;10 reps;Seated Hip Flexion/Marching: AROM;Both;10 reps;Seated    General Comments  Pertinent Vitals/Pain Pain Assessment: No/denies pain  Desat to 86% on RA just standing using urinal.  Recovered to 94% on RA with 2 min of rest.  Other VSS      Home Living                      Prior Function            PT Goals (current goals can now be found in the care plan section) Progress towards PT goals: Progressing toward goals    Frequency  Min 3X/week    PT Plan Discharge plan needs to be updated    Co-evaluation             End of Session Equipment Utilized During Treatment: Gait belt Activity Tolerance: Patient limited by fatigue Patient left: in chair;with call bell/phone  within reach     Time: 1100-1123 PT Time Calculation (min) (ACUTE ONLY): 23 min  Charges:  $Therapeutic Exercise: 8-22 mins $Therapeutic Activity: 8-22 mins                    G CodesDenice Paradise 12-03-14, 1:42 PM Alexandria Tala Eber,PT Acute Rehabilitation [phone] (pager)

## 2014-11-22 NOTE — Progress Notes (Addendum)
Pt. Transferred to 6n03 by wheelchair from 2c03 after report given.  Pt. Is accompanied by good friend [place].

## 2014-11-22 NOTE — Progress Notes (Signed)
PATIENT DETAILS Name: Garrett Turner Age: 64 y.o. Sex: male Date of Birth: [birth year] Admit Date: 11/19/2014 Admitting Physician Annita Brod, MD PIR:JJOACZ, Vernon Prey, MD  Subjective: Better-less SOB-but feels weak than usual  Assessment/Plan: Active Problems:   Acute hypoxic respiratory failure: Due to large left-sided pleural effusion. VQ scan low probability for pulmonary embolism. Underwent thoracocentesis on 5/3. Much better post thoracocentesis. Tapered off oxygen-now on room air.    Left-sided pleural effusion: Causing above. Suspect this is from underlying liver cirrhosis. Underwent thoracocentesis, reviewed pleural fluid analysis- consistent with a transudate. Cytology pending, pleural fluid cultures neg. Increase Aldactone to 100 mg BID, continue Lasix to 40 mg. Follow.     ? Healthcare associated pneumonia: CT scan personally reviewed-suspect more of atelectasis than pneumonia. On admission started on empiric vancomycin and Zosyn, blood cultures negative so far,discontinue vancomycin/Zosyn on 5/4 and  transitioned to levofloxacin with stop date of 11/23/14 for a few more days. 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 No major ascites on exam-abdomen does not appear tense. Continue to follow.Now back on Aldactone/Lasix    Pancytopenia: Secondary to underlying liver cirrhosis. Follow CBC.May need to stop aspirin at some point. Follow.    Mild coagulopathy: Secondary to liver cirrhosis. Follow.    Severe protein calorie malnutrition:  continue with feeding supplement    Dyslipidemia: Continue statin    Type 2 diabetes: CBG currently controlled with SSI, resume metformin on discharge     Mild hyponatremia: Suspect secondary to ascites and volume overload. Stable, on diuretics.  Disposition: Remain transfer to MedSurg unit. ?SNF on discharge  Antimicrobial agents  See  below  Anti-infectives    Start     Dose/Rate Route Frequency Ordered Stop   11/21/14 1300  levofloxacin (LEVAQUIN) tablet 750 mg     750 mg Oral Daily 11/21/14 1158     11/20/14 0400  vancomycin (VANCOCIN) IVPB 1000 mg/200 mL premix  Status:  Discontinued     1,000 mg 200 mL/hr over 60 Minutes Intravenous Every 12 hours 11/19/14 1417 11/21/14 1127   11/19/14 2200  piperacillin-tazobactam (ZOSYN) IVPB 3.375 g  Status:  Discontinued     3.375 g 12.5 mL/hr over 240 Minutes Intravenous 3 times per day 11/19/14 1417 11/21/14 1127   11/19/14 1400  piperacillin-tazobactam (ZOSYN) IVPB 3.375 g     3.375 g 100 mL/hr over 30 Minutes Intravenous  Once 11/19/14 1347 11/19/14 1511   11/19/14 1400  vancomycin (VANCOCIN) IVPB 1000 mg/200 mL premix     1,000 mg 200 mL/hr over 60 Minutes Intravenous  Once 11/19/14 1347 11/19/14 1715      DVT Prophylaxis:  SCD's  Code Status:  DNR-reconfirmed with patient at bedside   Family Communication None at bedside-patient awake/alert and understanding of plan  Procedures: None  CONSULTS:  pulmonary/intensive care  Time spent 20 minutes-which includes 50% of the time with face-to-face with patient/ family and coordinating care related to the above assessment and plan.  MEDICATIONS: Scheduled Meds: . antiseptic oral rinse  7 mL Mouth Rinse BID  . aspirin EC  81 mg Oral Daily  . atorvastatin  20 mg Oral q1800  . feeding supplement (ENSURE ENLIVE)  237 mL Oral BID BM  . furosemide  40 mg Oral Daily  . insulin aspart  0-20 Units Subcutaneous TID WC  . insulin aspart  0-5 Units Subcutaneous QHS  . lactulose  20 g Oral Daily  . levofloxacin  750 mg Oral Daily  . sodium chloride  3 mL Intravenous Q12H  . spironolactone  100 mg Oral BID   Continuous Infusions:  PRN Meds:.HYDROcodone-acetaminophen, ondansetron **OR** ondansetron (ZOFRAN) IV    PHYSICAL EXAM: Vital signs in last 24 hours: Filed Vitals:   11/21/14 2318 11/22/14 0400 11/22/14  0500 11/22/14 0736  BP: 103/56 110/59  110/59  Pulse:      Temp: 98.2 F (36.8 C) 98.1 F (36.7 C)  98.5 F (36.9 C)  TempSrc: Oral Oral  Oral  Resp: 22 18 20 13   Height:      Weight:      SpO2: 95%  93% 94%    Weight change:  Filed Weights   11/19/14 1303 11/19/14 1654  Weight: 70.875 kg (156 lb 4 oz) 72.8 kg (160 lb 7.9 oz)   Body mass index is 20.6 kg/(m^2).   Gen Exam: Awake and alert with clear speech. Not in any distress Neck: Supple, No JVD.   Chest: Slightly decreased air entry on left, good air entry on right-few scattered rales CVS: S1 S2 Regular, no murmurs.  Abdomen: soft, BS +, non tender,mildly distended-but not tense  Extremities: no edema, lower extremities warm to touch. Neurologic: Non Focal.   Skin: No Rash.   Wounds: N/A.   Intake/Output from previous day:  Intake/Output Summary (Last 24 hours) at 11/22/14 1041 Last data filed at 11/22/14 1004  Gross per 24 hour  Intake    603 ml  Output   1425 ml  Net   -822 ml     LAB RESULTS: CBC  Recent Labs Lab 11/19/14 1310 11/20/14 0337 11/21/14 0331  WBC 3.7* 3.3* 3.4*  HGB 9.9* 8.3* 8.7*  HCT 29.0* 23.7* 25.3*  PLT 58* 46* 69*  MCV 92.1 90.1 90.7  MCH 31.4 31.6 31.2  MCHC 34.1 35.0 34.4  RDW 16.8* 16.8* 16.5*  LYMPHSABS 0.5*  --   --   MONOABS 0.4  --   --   EOSABS 0.2  --   --   BASOSABS 0.0  --   --     Chemistries   Recent Labs Lab 11/19/14 1310 11/20/14 0337 11/21/14 0331  NA 135 134* 132*  K 3.9 3.7 4.0  CL 104 107 106  CO2 22 19* 19*  GLUCOSE 178* 118* 117*  BUN 13 11 14   CREATININE 1.12 0.95 0.99  CALCIUM 8.5* 7.6* 7.9*    CBG:  Recent Labs Lab 11/21/14 0820 11/21/14 1136 11/21/14 1737 11/21/14 2151 11/22/14 0736  GLUCAP 117* 169* 109* 164* 115*    GFR Estimated Creatinine Clearance: 76.6 mL/min (by C-G formula based on Cr of 0.99).  Coagulation profile  Recent Labs Lab 11/19/14 1310  INR 1.66*    Cardiac Enzymes No results for input(s): CKMB,  TROPONINI, MYOGLOBIN in the last 168 hours.  Invalid input(s): CK  Invalid input(s): POCBNP No results for input(s): DDIMER in the last 72 hours. No results for input(s): HGBA1C in the last 72 hours. No results for input(s): CHOL, HDL, LDLCALC, TRIG, CHOLHDL, LDLDIRECT in the last 72 hours. No results for input(s): TSH, T4TOTAL, T3FREE, THYROIDAB in the last 72 hours.  Invalid input(s): FREET3 No results for input(s): VITAMINB12, FOLATE, FERRITIN, TIBC, IRON, RETICCTPCT in the last 72 hours. No results for input(s): LIPASE, AMYLASE in the last 72 hours.  Urine Studies No results for input(s): UHGB, CRYS in the last 72 hours.  Invalid input(s): UACOL, UAPR, USPG,  UPH, UTP, UGL, UKET, UBIL, UNIT, UROB, ULEU, UEPI, UWBC, URBC, UBAC, CAST, UCOM, BILUA  MICROBIOLOGY: Recent Results (from the past 240 hour(s))  Body fluid culture     Status: None   Collection Time: 11/16/14 10:58 AM  Result Value Ref Range Status   Specimen Description ASCITIC  Final   Special Requests NONE  Final   Gram Stain   Final    FEW WBC PRESENT, PREDOMINANTLY MONONUCLEAR NO ORGANISMS SEEN Performed at [facility]    Culture   Final    NO GROWTH 3 DAYS Performed at [facility]    Report Status 11/19/2014 FINAL  Final  Culture, blood (routine x 2)     Status: None (Preliminary result)   Collection Time: 11/19/14  1:20 PM  Result Value Ref Range Status   Specimen Description BLOOD LEFT ARM  Final   Special Requests BOTTLES DRAWN AEROBIC AND ANAEROBIC 5MLS  Final   Culture   Final           BLOOD CULTURE RECEIVED NO GROWTH TO DATE CULTURE WILL BE HELD FOR 5 DAYS BEFORE ISSUING A FINAL NEGATIVE REPORT Performed at [facility]    Report Status PENDING  Incomplete  Culture, blood (routine x 2)     Status: None (Preliminary result)   Collection Time: 11/19/14  1:30 PM  Result Value Ref Range Status   Specimen Description BLOOD RIGHT ARM  Final   Special Requests BOTTLES DRAWN  AEROBIC AND ANAEROBIC 5ML  Final   Culture   Final           BLOOD CULTURE RECEIVED NO GROWTH TO DATE CULTURE WILL BE HELD FOR 5 DAYS BEFORE ISSUING A FINAL NEGATIVE REPORT Performed at [facility]    Report Status PENDING  Incomplete  MRSA PCR Screening     Status: None   Collection Time: 11/19/14  5:16 PM  Result Value Ref Range Status   MRSA by PCR NEGATIVE NEGATIVE Final    Comment:        The GeneXpert MRSA Assay (FDA approved for NASAL specimens only), is one component of a comprehensive MRSA colonization surveillance program. It is not intended to diagnose MRSA infection nor to guide or monitor treatment for MRSA infections.   AFB culture with smear     Status: None (Preliminary result)   Collection Time: 11/20/14  2:15 PM  Result Value Ref Range Status   Specimen Description FLUID LEFT PLEURAL  Final   Special Requests Normal  Final   Acid Fast Smear   Final    NO ACID FAST BACILLI SEEN Performed at [facility]    Culture   Final    CULTURE WILL BE EXAMINED FOR 6 WEEKS BEFORE ISSUING A FINAL REPORT Performed at [facility]    Report Status PENDING  Incomplete  Body fluid culture     Status: None (Preliminary result)   Collection Time: 11/20/14  2:15 PM  Result Value Ref Range Status   Specimen Description FLUID LEFT PLEURAL  Final   Special Requests Normal  Final   Gram Stain   Final    NO WBC SEEN NO SQUAMOUS EPITHELIAL CELLS SEEN NO ORGANISMS SEEN Performed at [facility]    Culture   Final    NO GROWTH 1 DAY Performed at [facility]    Report Status PENDING  Incomplete    RADIOLOGY STUDIES/RESULTS: Dg Chest 2 View  11/08/2014   CLINICAL DATA:  Two month history of shortness of breath, diagnosis of pneumonia and pleurisy in March of 2016, persistent shortness of breath and generalized weakness.  EXAM: CHEST  2 VIEW  COMPARISON:  Portable chest x-ray of October 06, 2014  FINDINGS: The lungs are borderline  hypoinflated. The interstitial markings remain increased bilaterally. There is no there is a lingular infiltrate. There small left pleural effusion. The cardiac silhouette is mildly enlarged. The pulmonary vascularity is not engorged. The bony thorax exhibits no acute abnormality. The patient has undergone previous kyphoplasty at T12  IMPRESSION: The study is limited due to mild hypo inflation. There remain increased interstitial markings bilaterally which may be chronic. There is subtle lingular infiltrate and small left pleural effusion. There been interval improvement especially on the left since October 06, 2014.   Electronically Signed   By: David  Martinique M.D.   On: 11/08/2014 10:33   Ct Angio Chest Pe W/cm &/or Wo Cm  11/19/2014   CLINICAL DATA:  Shortness of breath and weakness, recent paracentesis  EXAM: CT ANGIOGRAPHY CHEST WITH CONTRAST  TECHNIQUE: Multidetector CT imaging of the chest was performed using the standard protocol during bolus administration of intravenous contrast. Multiplanar CT image reconstructions and MIPs were obtained to evaluate the vascular anatomy.  CONTRAST:  10mL OMNIPAQUE IOHEXOL 350 MG/ML SOLN  COMPARISON:  10/04/2014  FINDINGS: A large left-sided pleural effusion is noted increased in the interval from the prior exam. Diffuse reticular fibrotic changes are noted throughout both lungs stable from the prior study. Left lower lobe atelectasis is noted related to the effusion.  The thoracic inlet is within normal limits. Aortic calcifications are noted without aneurysmal dilatation. The pulmonary artery demonstrates a normal branching pattern. No filling defects to suggest pulmonary emboli are identified. No significant hilar or mediastinal adenopathy is seen. Coronary calcifications are noted.  Scanning into the abdomen shows evidence of cirrhosis and changes of portal hypertension with splenomegaly and diffuse ascites. Single gallstone is noted similar to that seen on prior  ultrasound examination.  Review of the MIP images confirms the above findings.  IMPRESSION: No evidence of pulmonary embolism.  Large left pleural effusion with associated atelectasis.  Chronic interstitial changes throughout both lungs stable from the previous exam.  Changes of cirrhosis with portal hypertension and ascites.   Electronically Signed   By: Inez Catalina M.D.   On: 11/19/2014 16:14   US Abdomen Complete  11/08/2014   CLINICAL DATA:  Cirrhosis, history of ascites  EXAM: ULTRASOUND ABDOMEN COMPLETE  COMPARISON:  The gallbladder is adequately distended. There is an 8 mm stone near the neck. There is mild wall thickening to 10 mm. There is no positive sonographic Murphy's sign. There is a large volume of ascites adjacent the gallbladder.  FINDINGS: Gallbladder: 3.7 mm  Common bile duct: Diameter: 3.7 mm  Liver: The liver is shrunken and exhibits irregular surface contour. The echotexture is heterogeneous and there is a a focal area of increased echogenicity measures 4 x 4.2 x 4.3 cm.  IVC: No abnormality visualized.  Pancreas: Evaluation of the pancreatic head and tail is limited by bowel gas.  Spleen: Splenomegaly with calculated volume of 496 cc.  Right Kidney: Length: 10.7 cm. Echogenicity within normal limits. No mass or hydronephrosis visualized.  Left Kidney: Length: 11.5 cm. Echogenicity within normal limits. No mass or hydronephrosis visualized.  Abdominal aorta: No aneurysm visualized.  Other findings: Moderate to large volume of ascites is present.  IMPRESSION: 1. Hepatic cirrhosis with large volume of ascites.  There is a focal area of increased echogenicity within or adjacent to the liver that may be artifactual. However, hepatic protocol MRI or CT scanning is recommended to exclude an underlying mass. 2. Gallstones without acute cholecystitis. 3. Splenomegaly.   Electronically Signed   By: David  Martinique M.D.   On: 11/08/2014 11:38   Mr Liver W Wo Contrast  11/21/2014   CLINICAL DATA:   64 year old male with history of NASH cirrhosis.  EXAM: MRI ABDOMEN WITHOUT AND WITH CONTRAST  TECHNIQUE: Multiplanar multisequence MR imaging of the abdomen was performed both before and after the administration of intravenous contrast.  CONTRAST:  10 mm of the of this.  COMPARISON:  CT of the abdomen and pelvis 04/22/2014.  FINDINGS: Lower chest: Moderate left pleural effusion layering dependently, which appears to have some heterogeneous contents and potentially some internal debris.  Hepatobiliary: The liver has a shrunken appearance and nodular contour, compatible with severe cirrhosis. No discrete cystic or solid hepatic lesions are identified. Specifically, no hypervascular lesion is identified to strongly suggest presence of a hepatocellular carcinoma at this time. No intra or extrahepatic biliary ductal dilatation. Gallbladder is normal in appearance.  Pancreas: No pancreatic mass. No pancreatic ductal dilatation. No pancreatic or peripancreatic fluid collections.  Spleen: Spleen is enlarged measuring 16 x 7.1 x 14.1 cm (estimated splenic volume of 801 mL).  Adrenals/Urinary Tract: Bilateral adrenal glands and bilateral kidneys are normal in appearance. No hydroureteronephrosis in the visualized abdomen.  Stomach/Bowel: Multiple large gastric varices along the lesser curvature of the stomach. The appearance the stomach is otherwise unremarkable. Visualized portions of small bowel and colon are unremarkable.  Vascular/Lymphatic: Main portal vein is borderline dilated measuring 14 mm in diameter. Recannulized paraumbilical vein. Numerous portosystemic collateral vessels are noted, most notable for large gastric varices along the lesser curvature of the stomach. No arterial aneurysm identified in the visualized abdominal vasculature.  Other: Large volume of ascites.  Musculoskeletal: No aggressive osseous lesion noted in the visualized portions of the skeleton.  IMPRESSION: 1. Advanced cirrhosis with evidence  of portal hypertension, as demonstrated by numerous portosystemic collateral vessels, including large gastric varices, and splenomegaly. 2. No hypervascular hepatic lesion identified at this time to suggest hepatocellular carcinoma. 3. Large volume of ascites. 4. Moderate left pleural effusion layering dependently.   Electronically Signed   By: Vinnie Langton M.D.   On: 11/21/2014 17:33   US Paracentesis  11/16/2014   INDICATION: Primary biliary cirrhosis, recurrent ascites. Request is made for diagnostic and therapeutic paracentesis.  EXAM: ULTRASOUND-GUIDED DIAGNOSTIC AND THERAPEUTIC PARACENTESIS  COMPARISON:  Prior paracentesis on 08/16/2014  MEDICATIONS: None.  COMPLICATIONS: None immediate  TECHNIQUE: Informed written consent was obtained from the patient after a discussion of the risks, benefits and alternatives to treatment. A timeout was performed prior to the initiation of the procedure.  Initial ultrasound scanning demonstrates a moderate amount of ascites within the right upper abdominal quadrant. The right upper abdomen was prepped and draped in the usual sterile fashion. 1% lidocaine was used for local anesthesia. Under direct ultrasound guidance, a 19 gauge, 7-cm, Yueh catheter was introduced. An ultrasound image was saved for documentation purposed. The paracentesis was performed. The catheter was removed and a dressing was applied. The patient tolerated the procedure well without immediate post procedural complication.  FINDINGS: A total of approximately 2.6 liters of turbid,yellow fluid was removed. Samples were sent to the laboratory as requested by the clinical team.  IMPRESSION: Successful ultrasound-guided diagnostic and therapeutic paracentesis yielding 2.6 liters  of peritoneal fluid. The patient will receive IV albumin infusion postprocedure.  Read by: Rowe Robert, PA-C   Electronically Signed   By: Jerilynn Mages.  Shick M.D.   On: 11/16/2014 12:05   Dg Chest Port 1 View  11/20/2014   CLINICAL  DATA:  Status post left thoracentesis  EXAM: PORTABLE CHEST - 1 VIEW  COMPARISON:  11/19/2014  FINDINGS: Cardiac shadow is stable. Left thoracentesis has been performed without evidence of pneumothorax. No focal infiltrate is seen. The bony structures are within normal limits.  IMPRESSION: No evidence of pneumothorax following left thoracentesis.   Electronically Signed   By: Inez Catalina M.D.   On: 11/20/2014 15:03   Dg Chest Port 1 View  11/19/2014   CLINICAL DATA:  Shortness of breath for 4 months  EXAM: PORTABLE CHEST - 1 VIEW  COMPARISON:  11/08/2014  FINDINGS: Cardiac shadow is stable. Diffuse interstitial changes are again identified bilaterally but slightly worsened in the left lung base likely related to acute on chronic infiltrate. No acute bony abnormality is seen.  IMPRESSION: Acute on chronic infiltrate in the left base.   Electronically Signed   By: Inez Catalina M.D.   On: 11/19/2014 13:59    Oren Binet, MD  [facility] Pager:[phone]  If 7PM-7AM, please contact night-coverage [email or web address] Password [email or web address] 11/22/2014, 10:41 AM   LOS: 3 days

## 2014-11-23 ENCOUNTER — Inpatient Hospital Stay: Admission: RE | Admit: 2014-11-23 | Payer: Self-pay | Source: Ambulatory Visit

## 2014-11-23 DIAGNOSIS — K7460 Unspecified cirrhosis of liver: Secondary | ICD-10-CM

## 2014-11-23 LAB — ANA, BODY FLUID: Anti-Nuclear Ab, IgG: NOT DETECTED

## 2014-11-23 LAB — GLUCOSE, CAPILLARY
Glucose-Capillary: 115 mg/dL — ABNORMAL HIGH (ref 70–99)
Glucose-Capillary: 178 mg/dL — ABNORMAL HIGH (ref 70–99)

## 2014-11-23 LAB — URINE CULTURE
COLONY COUNT: NO GROWTH
Culture: NO GROWTH

## 2014-11-23 LAB — ADENOSINE DEAMINASE, FLUID: Adenosine Deaminase, Fluid: 6.6 U/L (ref <7.6)

## 2014-11-23 MED ORDER — TRAMADOL HCL 50 MG PO TABS: 50.0000 mg | ORAL_TABLET | Freq: Four times a day (QID) | ORAL | Status: DC | PRN

## 2014-11-23 MED ORDER — LEVOFLOXACIN 750 MG PO TABS: 750.0000 mg | ORAL_TABLET | Freq: Every day | ORAL | Status: DC

## 2014-11-23 MED ORDER — SPIRONOLACTONE 100 MG PO TABS: 100.0000 mg | ORAL_TABLET | Freq: Two times a day (BID) | ORAL | Status: DC

## 2014-11-23 NOTE — Clinical Social Work Note (Addendum)
CSW met with patient about SNF placement, and patient is in his copay days.  Patient was informed that his cost would be 161.50 per day at [facility].  Patient stated he cannot afford the cost and will try to go back home with home health.  Patient stated he has had [facility] home [facility] working with him before he was admitted this time, and has been pleased with the care.  CSW informed patient that once he has returned home and he feels like he does still need to go to SNF, he can talk with home health agency to see if they can help facilitate placement for him.  Patient will have home health instead of SNF placement, CSW notified case manager and physician.  Jones Broom. [place], MSW, [place] 11/23/2014 12:58 PM

## 2014-11-23 NOTE — Clinical Social Work Note (Signed)
Patient to be d/c'ed today to his home with home health.  Patient agreeable to plans will transport via daughter's car.    [name], MSW, [place]

## 2014-11-23 NOTE — Progress Notes (Signed)
[facility]  Resting comfortably Chart review shows he is ready for discharge  Fluid analysis consistent with transudate, Cytology negative  Effusion is related to cirrhosis/ascites which should be managed with diuretics as you are doing  Roselie Awkward, MD [place] [facility] Pager: [phone] Cell: [phone] If no response, call [phone]

## 2014-11-23 NOTE — Clinical Social Work Note (Signed)
Clinical Social Work Assessment  Patient Details  Name: Garrett Turner MRN: [id number] Date of Birth: [birth year]  Date of referral:  11/22/14               Reason for consult:  Facility Placement                Permission sought to share information with:  Chartered certified accountant granted to share information::  Yes, Verbal Permission Granted  Name::        Agency::  [facility]  Relationship::     Contact Information:     Housing/Transportation Living arrangements for the past 2 months:  Single Family Home Source of Information:  Patient Patient Interpreter Needed:  None Criminal Activity/Legal Involvement Pertinent to Current Situation/Hospitalization:  No - Comment as needed Significant Relationships:  Adult Children Lives with:  Self Do you feel safe going back to the place where you live?  Yes Need for family participation in patient care:  No (Coment)  Care giving concerns:  Patient lives alone, and does not have any support at home, he has been receiving home health from [facility] and would like to continue with them.  Patient is concerned about going back home, but states he will try it because he can not afford the copays for SNF.   Social Worker assessment / plan:  Patient is a 64 year old male who lives by himself at his own home.  Patient is alert and oriented x4 and able to carry on a pleasant conversation.  Patient states he would like to go back to [facility] for his short term rehab, but unfortunately he is in his Medicare Copay days, and he says he can not afford the cost at this time.  Patient was explained that he needs to not be admitted to the hospital for 60 days since his last SNF placement, in order for his 20 fully covered Medicare days start over.  Patient was admitted from March 17- March 22 this year, and was discharged from [facility] September 13, 2014.  Patient requested CSW to find out how much the copays would be, CSW contacted  [facility] and informed him of the cost.  Patient stated that is too much for him even if he gets billed and pays later.  Patient decided he will try to go back home with home health through [facility] since he has been pleased with the care being provided.  CSW informed patient that if he gets home and decides he does need to go to SNF and he can afford it the home health agency and his PCP can help him with placement.  CSW will sign off for patient since he is going home with home health and he will get a ride from his daughter.  Employment status:  Retired Forensic scientist:  Medicare PT Recommendations:  [place] / Referral to community resources:     Patient/Family's Response to care: Patient disappointed that he is in his copay days, but states he can not afford to pay the daily rate and will go home with home health.  Patient/Family's Understanding of and Emotional Response to Diagnosis, Current Treatment, and Prognosis:  Patient understands his diagnosis and will go home with home health.  Emotional Assessment Appearance:  Appears stated age Attitude/Demeanor/Rapport:  Other (Positive) Affect (typically observed):  Accepting, Appropriate, Pleasant Orientation:  Oriented to Self, Oriented to Place, Oriented to  Time, Oriented to Situation Alcohol / Substance  use:  Not Applicable Psych involvement (Current and /or in the community):  No (Comment)  Discharge Needs  Concerns to be addressed:  Lack of Support Readmission within the last 30 days:  No Current discharge risk:  Lives alone Barriers to Discharge:  Inadequate or no insurance   Nery Kalisz R. [place], MSW, [place] 11/23/2014 3:00 PM

## 2014-11-23 NOTE — Care Management Note (Signed)
Case Management Note  Patient Details  Name: Demichael Traum MRN: [id number] Date of Birth: Jan 21, 1951  Subjective/Objective:                    Action/Plan:   Expected Discharge Date:  11/23/14               Expected Discharge Plan:  Skilled Nursing Facility  In-House Referral:  Clinical Social Work  Discharge planning Services     Post Acute Care Choice:    Choice offered to:     DME Arranged:    DME Agency:     HH Arranged:    [place] Agency:     Status of Service:     Medicare Important Message Given:  Yes Date Medicare IM Given:  11/23/14 Medicare IM give by:  Magdalen Spatz RN BSN  Date Additional Medicare IM Given:    Additional Medicare Important Message give by:     If discussed at [place] of Stay Meetings, dates discussed:    Additional Comments:  Marilu Favre, RN 11/23/2014, 8:38 AM

## 2014-11-23 NOTE — Progress Notes (Signed)
Verbally understood DC instructions, no questions asked, DC home with daughter

## 2014-11-23 NOTE — Discharge Summary (Addendum)
PATIENT DETAILS Name: Garrett Turner Age: 64 y.o. Sex: male Date of Birth: 07-17-1951 MRN: [id number]. Admitting Physician: Annita Brod, MD XIP:[id number], Vernon Prey, MD  Admit Date: 11/19/2014 Discharge date: 11/23/2014  Recommendations for Outpatient Follow-up:  1. Please check CBC and chemistries within one week of discharge-patient on diuretics. May need further increase of Lasix. 2. Please ensure follow-up with Dr. Stacie Glaze gastroenterology  PRIMARY DISCHARGE DIAGNOSIS:  Active Problems:   HTN (hypertension)   Hyperlipidemia   Cirrhosis of liver   Sepsis   HCAP (healthcare-associated pneumonia)   Protein-calorie malnutrition, severe   Pleural effusion, left   Acute respiratory failure with hypoxia      PAST MEDICAL HISTORY: Past Medical History  Diagnosis Date  . Hypertension   . High cholesterol   . Anxiety disorder   . Pancytopenia   . UTI (lower urinary tract infection)   . Depression   . Anxiety   . Leg cramps   . Type II diabetes mellitus   . Liver cirrhosis secondary to NASH   . Infectious hepatitis 1971  . Pneumonia ~ 07/2014; 09/2014    DISCHARGE MEDICATIONS: Current Discharge Medication List    START taking these medications   Details  levofloxacin (LEVAQUIN) 750 MG tablet Take 1 tablet (750 mg total) by mouth daily. 2 more days from 5/6 and then stop Qty: 2 tablet, Refills: 0      CONTINUE these medications which have CHANGED   Details  spironolactone (ALDACTONE) 100 MG tablet Take 1 tablet (100 mg total) by mouth 2 (two) times daily. Qty: 60 tablet, Refills: 0   Associated Diagnoses: Cirrhosis of liver with ascites, unspecified hepatic cirrhosis type    traMADol (ULTRAM) 50 MG tablet Take 1 tablet (50 mg total) by mouth every 6 (six) hours as needed for moderate pain. Qty: 30 tablet, Refills: 0      CONTINUE these medications which have NOT CHANGED   Details  aspirin EC 81 MG EC tablet Take 1 tablet (81 mg total) by mouth daily.      atorvastatin (LIPITOR) 20 MG tablet Take 1 tablet (20 mg total) by mouth daily. Qty: 30 tablet, Refills: 3   Associated Diagnoses: Hyperlipidemia    diphenhydrAMINE-zinc acetate (BENADRYL) cream Apply topically 3 (three) times daily as needed for itching. Qty: 28.4 g, Refills: 0    escitalopram (LEXAPRO) 20 MG tablet Take 1 tablet (20 mg total) by mouth daily. Qty: 30 tablet, Refills: 0    feeding supplement, ENSURE COMPLETE, (ENSURE COMPLETE) LIQD Take 237 mLs by mouth 2 (two) times daily between meals.    furosemide (LASIX) 40 MG tablet Take 1 tablet (40 mg total) by mouth daily. Qty: 30 tablet, Refills: 3   Associated Diagnoses: Cirrhosis of liver with ascites, unspecified hepatic cirrhosis type    hydrocortisone (ANUSOL-HC) 2.5 % rectal cream Apply topically 2 (two) times daily. Qty: 30 g, Refills: 0    lactulose (CHRONULAC) 10 GM/15ML solution Take 30 mLs (20 g total) by mouth daily. Qty: 240 mL, Refills: 0    metFORMIN (GLUCOPHAGE) 1000 MG tablet Take 1,000 mg by mouth 2 (two) times daily with a meal.    mirtazapine (REMERON SOL-TAB) 15 MG disintegrating tablet Take 1 tablet (15 mg total) by mouth at bedtime. Qty: 30 tablet, Refills: 0    propranolol (INDERAL) 10 MG tablet Take 1 tablet (10 mg total) by mouth 2 (two) times daily. Qty: 180 tablet, Refills: 3   Associated Diagnoses: Cirrhosis of liver with ascites, unspecified hepatic cirrhosis  type        ALLERGIES:  No Known Allergies  BRIEF HPI:  See H&P, Labs, Consult and Test reports for all details in brief, patient is a 64 year old male with history of cirrhosis from NASH, diabetes, who presented with worsening shortness of breath that was mostly exertional. Patient was found to have a large left-sided pleural effusion, he was admitted for further evaluation and treatment.   CONSULTATIONS:   pulmonary/intensive care  PERTINENT RADIOLOGIC STUDIES: Dg Chest 2 View  11/08/2014   CLINICAL DATA:  Two month history  of shortness of breath, diagnosis of pneumonia and pleurisy in March of 2016, persistent shortness of breath and generalized weakness.  EXAM: CHEST  2 VIEW  COMPARISON:  Portable chest x-ray of October 06, 2014  FINDINGS: The lungs are borderline hypoinflated. The interstitial markings remain increased bilaterally. There is no there is a lingular infiltrate. There small left pleural effusion. The cardiac silhouette is mildly enlarged. The pulmonary vascularity is not engorged. The bony thorax exhibits no acute abnormality. The patient has undergone previous kyphoplasty at T12  IMPRESSION: The study is limited due to mild hypo inflation. There remain increased interstitial markings bilaterally which may be chronic. There is subtle lingular infiltrate and small left pleural effusion. There been interval improvement especially on the left since October 06, 2014.   Electronically Signed   By: David  Martinique M.D.   On: 11/08/2014 10:33   Ct Angio Chest Pe W/cm &/or Wo Cm  11/19/2014   CLINICAL DATA:  Shortness of breath and weakness, recent paracentesis  EXAM: CT ANGIOGRAPHY CHEST WITH CONTRAST  TECHNIQUE: Multidetector CT imaging of the chest was performed using the standard protocol during bolus administration of intravenous contrast. Multiplanar CT image reconstructions and MIPs were obtained to evaluate the vascular anatomy.  CONTRAST:  67mL OMNIPAQUE IOHEXOL 350 MG/ML SOLN  COMPARISON:  10/04/2014  FINDINGS: A large left-sided pleural effusion is noted increased in the interval from the prior exam. Diffuse reticular fibrotic changes are noted throughout both lungs stable from the prior study. Left lower lobe atelectasis is noted related to the effusion.  The thoracic inlet is within normal limits. Aortic calcifications are noted without aneurysmal dilatation. The pulmonary artery demonstrates a normal branching pattern. No filling defects to suggest pulmonary emboli are identified. No significant hilar or mediastinal  adenopathy is seen. Coronary calcifications are noted.  Scanning into the abdomen shows evidence of cirrhosis and changes of portal hypertension with splenomegaly and diffuse ascites. Single gallstone is noted similar to that seen on prior ultrasound examination.  Review of the MIP images confirms the above findings.  IMPRESSION: No evidence of pulmonary embolism.  Large left pleural effusion with associated atelectasis.  Chronic interstitial changes throughout both lungs stable from the previous exam.  Changes of cirrhosis with portal hypertension and ascites.   Electronically Signed   By: Inez Catalina M.D.   On: 11/19/2014 16:14   US Abdomen Complete  11/08/2014   CLINICAL DATA:  Cirrhosis, history of ascites  EXAM: ULTRASOUND ABDOMEN COMPLETE  COMPARISON:  The gallbladder is adequately distended. There is an 8 mm stone near the neck. There is mild wall thickening to 10 mm. There is no positive sonographic Murphy's sign. There is a large volume of ascites adjacent the gallbladder.  FINDINGS: Gallbladder: 3.7 mm  Common bile duct: Diameter: 3.7 mm  Liver: The liver is shrunken and exhibits irregular surface contour. The echotexture is heterogeneous and there is a a focal area of increased echogenicity  measures 4 x 4.2 x 4.3 cm.  IVC: No abnormality visualized.  Pancreas: Evaluation of the pancreatic head and tail is limited by bowel gas.  Spleen: Splenomegaly with calculated volume of 496 cc.  Right Kidney: Length: 10.7 cm. Echogenicity within normal limits. No mass or hydronephrosis visualized.  Left Kidney: Length: 11.5 cm. Echogenicity within normal limits. No mass or hydronephrosis visualized.  Abdominal aorta: No aneurysm visualized.  Other findings: Moderate to large volume of ascites is present.  IMPRESSION: 1. Hepatic cirrhosis with large volume of ascites. There is a focal area of increased echogenicity within or adjacent to the liver that may be artifactual. However, hepatic protocol MRI or CT scanning  is recommended to exclude an underlying mass. 2. Gallstones without acute cholecystitis. 3. Splenomegaly.   Electronically Signed   By: David  Martinique M.D.   On: 11/08/2014 11:38   Mr Liver W Wo Contrast  11/21/2014   CLINICAL DATA:  64 year old male with history of NASH cirrhosis.  EXAM: MRI ABDOMEN WITHOUT AND WITH CONTRAST  TECHNIQUE: Multiplanar multisequence MR imaging of the abdomen was performed both before and after the administration of intravenous contrast.  CONTRAST:  10 mm of the of this.  COMPARISON:  CT of the abdomen and pelvis 04/22/2014.  FINDINGS: Lower chest: Moderate left pleural effusion layering dependently, which appears to have some heterogeneous contents and potentially some internal debris.  Hepatobiliary: The liver has a shrunken appearance and nodular contour, compatible with severe cirrhosis. No discrete cystic or solid hepatic lesions are identified. Specifically, no hypervascular lesion is identified to strongly suggest presence of a hepatocellular carcinoma at this time. No intra or extrahepatic biliary ductal dilatation. Gallbladder is normal in appearance.  Pancreas: No pancreatic mass. No pancreatic ductal dilatation. No pancreatic or peripancreatic fluid collections.  Spleen: Spleen is enlarged measuring 16 x 7.1 x 14.1 cm (estimated splenic volume of 801 mL).  Adrenals/Urinary Tract: Bilateral adrenal glands and bilateral kidneys are normal in appearance. No hydroureteronephrosis in the visualized abdomen.  Stomach/Bowel: Multiple large gastric varices along the lesser curvature of the stomach. The appearance the stomach is otherwise unremarkable. Visualized portions of small bowel and colon are unremarkable.  Vascular/Lymphatic: Main portal vein is borderline dilated measuring 14 mm in diameter. Recannulized paraumbilical vein. Numerous portosystemic collateral vessels are noted, most notable for large gastric varices along the lesser curvature of the stomach. No arterial  aneurysm identified in the visualized abdominal vasculature.  Other: Large volume of ascites.  Musculoskeletal: No aggressive osseous lesion noted in the visualized portions of the skeleton.  IMPRESSION: 1. Advanced cirrhosis with evidence of portal hypertension, as demonstrated by numerous portosystemic collateral vessels, including large gastric varices, and splenomegaly. 2. No hypervascular hepatic lesion identified at this time to suggest hepatocellular carcinoma. 3. Large volume of ascites. 4. Moderate left pleural effusion layering dependently.   Electronically Signed   By: Vinnie Langton M.D.   On: 11/21/2014 17:33   US Paracentesis  11/16/2014   INDICATION: Primary biliary cirrhosis, recurrent ascites. Request is made for diagnostic and therapeutic paracentesis.  EXAM: ULTRASOUND-GUIDED DIAGNOSTIC AND THERAPEUTIC PARACENTESIS  COMPARISON:  Prior paracentesis on 08/16/2014  MEDICATIONS: None.  COMPLICATIONS: None immediate  TECHNIQUE: Informed written consent was obtained from the patient after a discussion of the risks, benefits and alternatives to treatment. A timeout was performed prior to the initiation of the procedure.  Initial ultrasound scanning demonstrates a moderate amount of ascites within the right upper abdominal quadrant. The right upper abdomen was prepped and draped  in the usual sterile fashion. 1% lidocaine was used for local anesthesia. Under direct ultrasound guidance, a 19 gauge, 7-cm, Yueh catheter was introduced. An ultrasound image was saved for documentation purposed. The paracentesis was performed. The catheter was removed and a dressing was applied. The patient tolerated the procedure well without immediate post procedural complication.  FINDINGS: A total of approximately 2.6 liters of turbid,yellow fluid was removed. Samples were sent to the laboratory as requested by the clinical team.  IMPRESSION: Successful ultrasound-guided diagnostic and therapeutic paracentesis yielding  2.6 liters of peritoneal fluid. The patient will receive IV albumin infusion postprocedure.  Read by: Rowe Robert, PA-C   Electronically Signed   By: Jerilynn Mages.  Shick M.D.   On: 11/16/2014 12:05   Dg Chest Port 1 View  11/20/2014   CLINICAL DATA:  Status post left thoracentesis  EXAM: PORTABLE CHEST - 1 VIEW  COMPARISON:  11/19/2014  FINDINGS: Cardiac shadow is stable. Left thoracentesis has been performed without evidence of pneumothorax. No focal infiltrate is seen. The bony structures are within normal limits.  IMPRESSION: No evidence of pneumothorax following left thoracentesis.   Electronically Signed   By: Inez Catalina M.D.   On: 11/20/2014 15:03   Dg Chest Port 1 View  11/19/2014   CLINICAL DATA:  Shortness of breath for 4 months  EXAM: PORTABLE CHEST - 1 VIEW  COMPARISON:  11/08/2014  FINDINGS: Cardiac shadow is stable. Diffuse interstitial changes are again identified bilaterally but slightly worsened in the left lung base likely related to acute on chronic infiltrate. No acute bony abnormality is seen.  IMPRESSION: Acute on chronic infiltrate in the left base.   Electronically Signed   By: Inez Catalina M.D.   On: 11/19/2014 13:59     PERTINENT LAB RESULTS: CBC:  Recent Labs  11/21/14 0331 11/22/14 1439  WBC 3.4* 2.8*  HGB 8.7* 8.8*  HCT 25.3* 25.0*  PLT 69* 55*   CMET CMP     Component Value Date/Time   NA 132* 11/21/2014 0331   K 4.0 11/21/2014 0331   CL 106 11/21/2014 0331   CO2 19* 11/21/2014 0331   GLUCOSE 117* 11/21/2014 0331   BUN 14 11/21/2014 0331   CREATININE 0.99 11/21/2014 0331   CREATININE 0.90 10/22/2014 1159   CALCIUM 7.9* 11/21/2014 0331   PROT 6.3* 11/21/2014 0331   ALBUMIN 2.3* 11/21/2014 0331   AST 50* 11/21/2014 0331   ALT 23 11/21/2014 0331   ALKPHOS 105 11/21/2014 0331   BILITOT 2.4* 11/21/2014 0331   GFRNONAA >60 11/21/2014 0331   GFRNONAA 89 10/22/2014 1159   GFRAA >60 11/21/2014 0331   GFRAA >89 10/22/2014 1159    GFR Estimated Creatinine  Clearance: 76.6 mL/min (by C-G formula based on Cr of 0.99). No results for input(s): LIPASE, AMYLASE in the last 72 hours. No results for input(s): CKTOTAL, CKMB, CKMBINDEX, TROPONINI in the last 72 hours. Invalid input(s): POCBNP No results for input(s): DDIMER in the last 72 hours. No results for input(s): HGBA1C in the last 72 hours. No results for input(s): CHOL, HDL, LDLCALC, TRIG, CHOLHDL, LDLDIRECT in the last 72 hours. No results for input(s): TSH, T4TOTAL, T3FREE, THYROIDAB in the last 72 hours.  Invalid input(s): FREET3 No results for input(s): VITAMINB12, FOLATE, FERRITIN, TIBC, IRON, RETICCTPCT in the last 72 hours. Coags: No results for input(s): INR in the last 72 hours.  Invalid input(s): PT Microbiology: Recent Results (from the past 240 hour(s))  Body fluid culture     Status: None  Collection Time: 11/16/14 10:58 AM  Result Value Ref Range Status   Specimen Description ASCITIC  Final   Special Requests NONE  Final   Gram Stain   Final    FEW WBC PRESENT, PREDOMINANTLY MONONUCLEAR NO ORGANISMS SEEN Performed at [facility]    Culture   Final    NO GROWTH 3 DAYS Performed at [facility]    Report Status 11/19/2014 FINAL  Final  Culture, blood (routine x 2)     Status: None (Preliminary result)   Collection Time: 11/19/14  1:20 PM  Result Value Ref Range Status   Specimen Description BLOOD LEFT ARM  Final   Special Requests BOTTLES DRAWN AEROBIC AND ANAEROBIC 5MLS  Final   Culture   Final           BLOOD CULTURE RECEIVED NO GROWTH TO DATE CULTURE WILL BE HELD FOR 5 DAYS BEFORE ISSUING A FINAL NEGATIVE REPORT Performed at [facility]    Report Status PENDING  Incomplete  Culture, blood (routine x 2)     Status: None (Preliminary result)   Collection Time: 11/19/14  1:30 PM  Result Value Ref Range Status   Specimen Description BLOOD RIGHT ARM  Final   Special Requests BOTTLES DRAWN AEROBIC AND ANAEROBIC 5ML  Final   Culture    Final           BLOOD CULTURE RECEIVED NO GROWTH TO DATE CULTURE WILL BE HELD FOR 5 DAYS BEFORE ISSUING A FINAL NEGATIVE REPORT Performed at [facility]    Report Status PENDING  Incomplete  MRSA PCR Screening     Status: None   Collection Time: 11/19/14  5:16 PM  Result Value Ref Range Status   MRSA by PCR NEGATIVE NEGATIVE Final    Comment:        The GeneXpert MRSA Assay (FDA approved for NASAL specimens only), is one component of a comprehensive MRSA colonization surveillance program. It is not intended to diagnose MRSA infection nor to guide or monitor treatment for MRSA infections.   AFB culture with smear     Status: None (Preliminary result)   Collection Time: 11/20/14  2:15 PM  Result Value Ref Range Status   Specimen Description FLUID LEFT PLEURAL  Final   Special Requests Normal  Final   Acid Fast Smear   Final    NO ACID FAST BACILLI SEEN Performed at [facility]    Culture   Final    CULTURE WILL BE EXAMINED FOR 6 WEEKS BEFORE ISSUING A FINAL REPORT Performed at [facility]    Report Status PENDING  Incomplete  Body fluid culture     Status: None (Preliminary result)   Collection Time: 11/20/14  2:15 PM  Result Value Ref Range Status   Specimen Description FLUID LEFT PLEURAL  Final   Special Requests Normal  Final   Gram Stain   Final    NO WBC SEEN NO SQUAMOUS EPITHELIAL CELLS SEEN NO ORGANISMS SEEN Performed at [facility]    Culture   Final    NO GROWTH 1 DAY Performed at [facility]    Report Status PENDING  Incomplete     BRIEF HOSPITAL COURSE:  Acute hypoxic respiratory failure: Due to large left-sided pleural effusion. VQ scan low probability for pulmonary embolism. Underwent thoracocentesis on 5/3. Significantly better post thoracocentesis. Tapered off oxygen-now on room air.   Left-sided pleural effusion: Causing above. Transudative physiology from underlying liver cirrhosis.  Underwent  thoracocentesis on 11/20/14. Cytology was negative, pleural fluid cultures neg. Increased Aldactone to 100 mg BID, continue Lasix to 40 mg. May need further increase in lasix dose-defer to outpatient setting.   ? Healthcare associated pneumonia: CT scan personally reviewed-suspect more of atelectasis than pneumonia. On admission started on empiric vancomycin and Zosyn, blood cultures negative so far,discontinue vancomycin/Zosyn on 5/4 and transitioned to levofloxacin with stop date of 11/23/14. Remains afebrile and significantly improved.  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 MRI Abd done at patient's request (as was scheduled as outpatient)- which showed advanced cirrhosis with evidence of portal HTN, no lesion to suggest hepatocellular carcinoma. No major ascites on exam-abdomen does not appear tense. Continue  Aldactone/Lasix and propranolol. Please ensure follow up with Dr Gillermina Hu.   Pancytopenia: Secondary to underlying liver cirrhosis. Follow CBC.May need to stop aspirin at some point. Follow closely at SNF   Mild coagulopathy: Secondary to liver cirrhosis. Follow INR periodically   Severe protein calorie malnutrition: continue with feeding supplement   Dyslipidemia: Continue statin   Type 2 diabetes: CBG currently controlled with SSI, resume metformin on discharge   Mild hyponatremia: Suspect secondary to ascites and volume overload. Stable, on diuretics.   TODAY-DAY OF DISCHARGE:  Subjective:   Garrett Turner today has no headache,no chest abdominal pain,no new weakness tingling or numbness, feels much better wants to go home today.   Objective:   Blood pressure 106/58, pulse 89, temperature 98.8 F (37.1 C), temperature source Oral, resp. rate 17, height 6\' 2"  (1.88 m), weight 72.8 kg (160 lb 7.9 oz), SpO2 96 %.  Intake/Output Summary (Last 24 hours) at 11/23/14 0901 Last data filed at 11/23/14 0559  Gross per 24 hour    Intake    483 ml  Output   1450 ml  Net   -967 ml   Filed Weights   11/19/14 1303 11/19/14 1654  Weight: 70.875 kg (156 lb 4 oz) 72.8 kg (160 lb 7.9 oz)    Exam Awake Alert, Oriented *3, No new F.N deficits, Normal affect [place].AT,PERRAL Supple Neck,No JVD, No cervical lymphadenopathy appriciated.  Symmetrical Chest wall movement,Slightly decreased air movement at left lung base, CTAB RRR,No Gallops,Rubs or new Murmurs, No Parasternal Heave +ve B.Sounds, Abd Soft, Non tender, No organomegaly appriciated, No rebound -guarding or rigidity. No Cyanosis, Clubbing or edema, No new Rash or bruise  DISCHARGE CONDITION: Stable  DISPOSITION: SNF  Addendum 12 pm:due to insurance issues, patient not able to go to SNF-will now be discharged home with Home health services  DISCHARGE INSTRUCTIONS:    Activity:  As tolerated   Diet recommendation: Diabetic Diet Heart Healthy diet Fluid restriction 1.5 lit/day  CODE STATUS: DNR  Discharge Instructions    Diet - low sodium heart healthy    Complete by:  As directed   1500 cc of fluid/day     Diet Carb Modified    Complete by:  As directed      Increase activity slowly    Complete by:  As directed            Follow-up Information    Follow up with Lorayne Marek, MD. Schedule an appointment as soon as possible for a visit in 2 weeks.   Specialty:  Internal Medicine   Contact information:   [place] [address] [phone]       Follow up with Landry Dyke, MD. Schedule an appointment as soon as possible for a visit in  2 weeks.   Specialty:  Gastroenterology   Contact information:   [address] [place] [address] [phone]       Total Time spent on discharge equals 45 minutes.  SignedOren Binet 11/23/2014 9:01 AM

## 2014-11-23 NOTE — Care Management Note (Signed)
Case Management Note  Patient Details  Name: Garrett Turner MRN: [id number] Date of Birth: 30-Jul-1950  Subjective/Objective:                    Action/Plan: Patient set up with Garrett Turner , states he has all DME already   Expected Discharge Date:  11/23/14               Expected Discharge Plan:  [place]  In-House Referral:  Clinical Social Work  Discharge planning Services  CM Consult  Post Acute Care Choice:    Choice offered to:     DME Arranged:    DME Agency:     HH Arranged:  RN, PT, OT, Nurse's Aide [place] Agency:  [facility]  Status of Service:     Medicare Important Message Given:  Yes Date Medicare IM Given:  11/23/14 Medicare IM give by:  Magdalen Spatz RN BSN  Date Additional Medicare IM Given:    Additional Medicare Important Message give by:     If discussed at [place] of Stay Meetings, dates discussed:    Additional Comments:  Garrett Favre, RN 11/23/2014, 12:10 PM

## 2014-11-24 LAB — BODY FLUID CULTURE
Culture: NO GROWTH
GRAM STAIN: NONE SEEN
SPECIAL REQUESTS: NORMAL

## 2014-11-25 LAB — CULTURE, BLOOD (ROUTINE X 2)
Culture: NO GROWTH
Culture: NO GROWTH

## 2014-11-27 LAB — MISCELLANEOUS TEST

## 2014-12-03 ENCOUNTER — Inpatient Hospital Stay: Payer: Self-pay | Admitting: Internal Medicine

## 2014-12-13 ENCOUNTER — Inpatient Hospital Stay: Payer: Self-pay | Admitting: Internal Medicine

## 2014-12-25 ENCOUNTER — Ambulatory Visit: Payer: Medicare Other | Attending: Internal Medicine | Admitting: Internal Medicine

## 2014-12-25 ENCOUNTER — Encounter: Payer: Self-pay | Admitting: Internal Medicine

## 2014-12-25 VITALS — BP 106/66 | HR 69 | Temp 98.8°F | Resp 15 | Wt 167.0 lb

## 2014-12-25 DIAGNOSIS — R188 Other ascites: Secondary | ICD-10-CM | POA: Diagnosis not present

## 2014-12-25 DIAGNOSIS — E119 Type 2 diabetes mellitus without complications: Secondary | ICD-10-CM | POA: Insufficient documentation

## 2014-12-25 DIAGNOSIS — Z7982 Long term (current) use of aspirin: Secondary | ICD-10-CM | POA: Insufficient documentation

## 2014-12-25 DIAGNOSIS — Z7722 Contact with and (suspected) exposure to environmental tobacco smoke (acute) (chronic): Secondary | ICD-10-CM | POA: Diagnosis not present

## 2014-12-25 DIAGNOSIS — J90 Pleural effusion, not elsewhere classified: Secondary | ICD-10-CM | POA: Insufficient documentation

## 2014-12-25 DIAGNOSIS — E785 Hyperlipidemia, unspecified: Secondary | ICD-10-CM | POA: Diagnosis not present

## 2014-12-25 DIAGNOSIS — K766 Portal hypertension: Secondary | ICD-10-CM | POA: Diagnosis not present

## 2014-12-25 DIAGNOSIS — I639 Cerebral infarction, unspecified: Secondary | ICD-10-CM | POA: Diagnosis not present

## 2014-12-25 DIAGNOSIS — I10 Essential (primary) hypertension: Secondary | ICD-10-CM | POA: Insufficient documentation

## 2014-12-25 DIAGNOSIS — E139 Other specified diabetes mellitus without complications: Secondary | ICD-10-CM | POA: Diagnosis not present

## 2014-12-25 DIAGNOSIS — F419 Anxiety disorder, unspecified: Secondary | ICD-10-CM | POA: Diagnosis not present

## 2014-12-25 DIAGNOSIS — F329 Major depressive disorder, single episode, unspecified: Secondary | ICD-10-CM | POA: Diagnosis not present

## 2014-12-25 DIAGNOSIS — K7460 Unspecified cirrhosis of liver: Secondary | ICD-10-CM | POA: Diagnosis not present

## 2014-12-25 DIAGNOSIS — J948 Other specified pleural conditions: Secondary | ICD-10-CM

## 2014-12-25 LAB — BASIC METABOLIC PANEL
BUN: 13 mg/dL (ref 6–23)
CO2: 22 mEq/L (ref 19–32)
Calcium: 8.2 mg/dL — ABNORMAL LOW (ref 8.4–10.5)
Chloride: 103 mEq/L (ref 96–112)
Creat: 0.93 mg/dL (ref 0.50–1.35)
Glucose, Bld: 131 mg/dL — ABNORMAL HIGH (ref 70–99)
POTASSIUM: 3.8 meq/L (ref 3.5–5.3)
SODIUM: 134 meq/L — AB (ref 135–145)

## 2014-12-25 LAB — POCT GLYCOSYLATED HEMOGLOBIN (HGB A1C): HEMOGLOBIN A1C: 6.5

## 2014-12-25 MED ORDER — METFORMIN HCL 1000 MG PO TABS: 1000.0000 mg | ORAL_TABLET | Freq: Two times a day (BID) | ORAL | Status: AC

## 2014-12-25 MED ORDER — ATORVASTATIN CALCIUM 20 MG PO TABS: 20.0000 mg | ORAL_TABLET | Freq: Every day | ORAL | Status: AC

## 2014-12-25 MED ORDER — PROPRANOLOL HCL 10 MG PO TABS: 10.0000 mg | ORAL_TABLET | Freq: Two times a day (BID) | ORAL | Status: DC

## 2014-12-25 MED ORDER — SPIRONOLACTONE 100 MG PO TABS: 100.0000 mg | ORAL_TABLET | Freq: Two times a day (BID) | ORAL | Status: DC

## 2014-12-25 MED ORDER — FUROSEMIDE 40 MG PO TABS: 60.0000 mg | ORAL_TABLET | Freq: Every day | ORAL | Status: DC

## 2014-12-25 NOTE — Patient Instructions (Signed)
DASH Eating Plan °DASH stands for "Dietary Approaches to Stop Hypertension." The DASH eating plan is a healthy eating plan that has been shown to reduce high blood pressure (hypertension). Additional health benefits may include reducing the risk of type 2 diabetes mellitus, heart disease, and stroke. The DASH eating plan may also help with weight loss. °WHAT DO I NEED TO KNOW ABOUT THE DASH EATING PLAN? °For the DASH eating plan, you will follow these general guidelines: °· Choose foods with a percent daily value for sodium of less than 5% (as listed on the food label). °· Use salt-free seasonings or herbs instead of table salt or sea salt. °· Check with your health care provider or pharmacist before using salt substitutes. °· Eat lower-sodium products, often labeled as "lower sodium" or "no salt added." °· Eat fresh foods. °· Eat more vegetables, fruits, and low-fat dairy products. °· Choose whole grains. Look for the word "whole" as the first word in the ingredient list. °· Choose fish and skinless chicken or turkey more often than red meat. Limit fish, poultry, and meat to 6 oz (170 g) each day. °· Limit sweets, desserts, sugars, and sugary drinks. °· Choose heart-healthy fats. °· Limit cheese to 1 oz (28 g) per day. °· Eat more home-cooked food and less restaurant, buffet, and fast food. °· Limit fried foods. °· Cook foods using methods other than frying. °· Limit canned vegetables. If you do use them, rinse them well to decrease the sodium. °· When eating at a restaurant, ask that your food be prepared with less salt, or no salt if possible. °WHAT FOODS CAN I EAT? °Seek help from a dietitian for individual calorie needs. °Grains °Whole grain or whole wheat bread. Brown rice. Whole grain or whole wheat pasta. Quinoa, bulgur, and whole grain cereals. Low-sodium cereals. Corn or whole wheat flour tortillas. Whole grain cornbread. Whole grain crackers. Low-sodium crackers. °Vegetables °Fresh or frozen vegetables  (raw, steamed, roasted, or grilled). Low-sodium or reduced-sodium tomato and vegetable juices. Low-sodium or reduced-sodium tomato sauce and paste. Low-sodium or reduced-sodium canned vegetables.  °Fruits °All fresh, canned (in natural juice), or frozen fruits. °Meat and Other Protein Products °Ground beef (85% or leaner), grass-fed beef, or beef trimmed of fat. Skinless chicken or turkey. Ground chicken or turkey. Pork trimmed of fat. All fish and seafood. Eggs. Dried beans, peas, or lentils. Unsalted nuts and seeds. Unsalted canned beans. °Dairy °Low-fat dairy products, such as skim or 1% milk, 2% or reduced-fat cheeses, low-fat ricotta or cottage cheese, or plain low-fat yogurt. Low-sodium or reduced-sodium cheeses. °Fats and Oils °Tub margarines without trans fats. Light or reduced-fat mayonnaise and salad dressings (reduced sodium). Avocado. Safflower, olive, or canola oils. Natural peanut or almond butter. °Other °Unsalted popcorn and pretzels. °The items listed above may not be a complete list of recommended foods or beverages. Contact your dietitian for more options. °WHAT FOODS ARE NOT RECOMMENDED? °Grains °White bread. White pasta. White rice. Refined cornbread. Bagels and croissants. Crackers that contain trans fat. °Vegetables °Creamed or fried vegetables. Vegetables in a cheese sauce. Regular canned vegetables. Regular canned tomato sauce and paste. Regular tomato and vegetable juices. °Fruits °Dried fruits. Canned fruit in light or heavy syrup. Fruit juice. °Meat and Other Protein Products °Fatty cuts of meat. Ribs, chicken wings, bacon, sausage, bologna, salami, chitterlings, fatback, hot dogs, bratwurst, and packaged luncheon meats. Salted nuts and seeds. Canned beans with salt. °Dairy °Whole or 2% milk, cream, half-and-half, and cream cheese. Whole-fat or sweetened yogurt. Full-fat   cheeses or blue cheese. Nondairy creamers and whipped toppings. Processed cheese, cheese spreads, or cheese  curds. °Condiments °Onion and garlic salt, seasoned salt, table salt, and sea salt. Canned and packaged gravies. Worcestershire sauce. Tartar sauce. Barbecue sauce. Teriyaki sauce. Soy sauce, including reduced sodium. Steak sauce. Fish sauce. Oyster sauce. Cocktail sauce. Horseradish. Ketchup and mustard. Meat flavorings and tenderizers. Bouillon cubes. Hot sauce. Tabasco sauce. Marinades. Taco seasonings. Relishes. °Fats and Oils °Butter, stick margarine, lard, shortening, ghee, and bacon fat. Coconut, palm kernel, or palm oils. Regular salad dressings. °Other °Pickles and olives. Salted popcorn and pretzels. °The items listed above may not be a complete list of foods and beverages to avoid. Contact your dietitian for more information. °WHERE CAN I FIND MORE INFORMATION? °National [employer], [employer], [employer]: www.nhlbi.nih.gov/health/health-topics/topics/dash/ °Document Released: 06/25/2011 Document Revised: 11/20/2013 Document Reviewed: 05/10/2013 °Exit[employer]® Patient Information ©2015 [employer]. This information is not intended to replace advice given to you by your health care provider. Make sure you discuss any questions you have with your health care provider. ° °

## 2014-12-25 NOTE — Progress Notes (Signed)
Patient here for follow up from hospital admission in may Patient was treated for health care acquired pneumonia Patient requesting refills on his medications

## 2014-12-25 NOTE — Progress Notes (Signed)
MRN: [id number] Name: Garrett Turner  Sex: male Age: 64 y.o. DOB: February 02, 1951  Allergies: Review of patient's allergies indicates no known allergies.  Chief Complaint  Patient presents with  . Follow-up    HPI: Patient is 64 y.o. male who has history of hypertension hyperlipidemia cirrhosis of liver, pneumonia, recently hospitalized with symptoms of shortness of breath with acute respiratory failure with hypoxia, was found to have lost left-sided pleural effusion, EMR reviewed pulmonary was on board, patient underwent thoracocentesis and significantly improved with the symptoms of shortness of breath, fluid analysis showed transudate likely secondary to cirrhosis, his Aldactone was increased continued with Lasix, initially was also treated with antibiotic for suspected pneumonia during hospitalization, patient had MRI of her abdomen which reported advanced cirrhosis with evidence of portal hypertension no lesion to suggest hepatocellular CA, patient was advised to follow with the GI which patient has already scheduled appointment end of  this month.  Past Medical History  Diagnosis Date  . Hypertension   . High cholesterol   . Anxiety disorder   . Pancytopenia   . UTI (lower urinary tract infection)   . Depression   . Anxiety   . Leg cramps   . Type II diabetes mellitus   . Liver cirrhosis secondary to NASH   . Infectious hepatitis 1971  . Pneumonia ~ 07/2014; 09/2014    Past Surgical History  Procedure Laterality Date  . Tonsillectomy    . Esophagogastroduodenoscopy  11/04/2011    Procedure: ESOPHAGOGASTRODUODENOSCOPY (EGD);  Surgeon: Arta Silence, MD;  Location: [facility];  Service: Endoscopy;  Laterality: N/A;  . Colonoscopy  11/04/2011    Procedure: COLONOSCOPY;  Surgeon: Arta Silence, MD;  Location: [facility];  Service: Endoscopy;  Laterality: N/A;  . Vasectomy  1985  . Fixation kyphoplasty lumbar spine      "L5"  . Cardiac catheterization  "years ago"  .  Inguinal hernia repair Bilateral 06/2013      Medication List       This list is accurate as of: 12/25/14  2:52 PM.  Always use your most recent med list.               aspirin 81 MG EC tablet  Take 1 tablet (81 mg total) by mouth daily.     atorvastatin 20 MG tablet  Commonly known as:  LIPITOR  Take 1 tablet (20 mg total) by mouth daily.     diphenhydrAMINE-zinc acetate cream  Commonly known as:  BENADRYL  Apply topically 3 (three) times daily as needed for itching.     escitalopram 20 MG tablet  Commonly known as:  LEXAPRO  Take 1 tablet (20 mg total) by mouth daily.     feeding supplement (ENSURE COMPLETE) Liqd  Take 237 mLs by mouth 2 (two) times daily between meals.     furosemide 40 MG tablet  Commonly known as:  LASIX  Take 1.5 tablets (60 mg total) by mouth daily.     hydrocortisone 2.5 % rectal cream  Commonly known as:  ANUSOL-HC  Apply topically 2 (two) times daily.     lactulose 10 GM/15ML solution  Commonly known as:  CHRONULAC  Take 30 mLs (20 g total) by mouth daily.     metFORMIN 1000 MG tablet  Commonly known as:  GLUCOPHAGE  Take 1 tablet (1,000 mg total) by mouth 2 (two) times daily with a meal.     mirtazapine 15 MG disintegrating tablet  Commonly known as:  REMERON SOL-TAB  Take 1 tablet (15 mg total) by mouth at bedtime.     propranolol 10 MG tablet  Commonly known as:  INDERAL  Take 1 tablet (10 mg total) by mouth 2 (two) times daily.     spironolactone 100 MG tablet  Commonly known as:  ALDACTONE  Take 1 tablet (100 mg total) by mouth 2 (two) times daily.     traMADol 50 MG tablet  Commonly known as:  ULTRAM  Take 1 tablet (50 mg total) by mouth every 6 (six) hours as needed for moderate pain.        Meds ordered this encounter  Medications  . atorvastatin (LIPITOR) 20 MG tablet    Sig: Take 1 tablet (20 mg total) by mouth daily.    Dispense:  30 tablet    Refill:  3  . furosemide (LASIX) 40 MG tablet    Sig: Take 1.5  tablets (60 mg total) by mouth daily.    Dispense:  30 tablet    Refill:  3  . metFORMIN (GLUCOPHAGE) 1000 MG tablet    Sig: Take 1 tablet (1,000 mg total) by mouth 2 (two) times daily with a meal.    Dispense:  60 tablet    Refill:  3  . propranolol (INDERAL) 10 MG tablet    Sig: Take 1 tablet (10 mg total) by mouth 2 (two) times daily.    Dispense:  180 tablet    Refill:  3  . spironolactone (ALDACTONE) 100 MG tablet    Sig: Take 1 tablet (100 mg total) by mouth 2 (two) times daily.    Dispense:  60 tablet    Refill:  3    Immunization History  Administered Date(s) Administered  . Influenza Split 04/14/2012, 04/24/2013  . Influenza,inj,Quad PF,36+ Mos 06/19/2014  . Pneumococcal Polysaccharide-23 04/14/2012, 04/24/2013  . Td 04/04/2012    Family History  Problem Relation Age of Onset  . Coronary artery disease    . Hypertension    . Heart failure Father   . Hypertension Father   . Heart disease Father   . Heart failure Mother   . Hypertension Mother   . Heart disease Mother   . Stroke Paternal Grandmother   . Obesity Daughter     History  Substance Use Topics  . Smoking status: Passive Smoke Exposure - Never Smoker    Types: Cigarettes  . Smokeless tobacco: Former Systems developer    Types: Chew     Comment: "used to chew tobacco when I played baseball"  . Alcohol Use: 0.6 oz/week    1 Standard drinks or equivalent per week     Comment: 10/04/2014 "haven't drank anything at all for 6 months because of the cirrhosis; before that a couple drinks/month"    Review of Systems   As noted in HPI  Filed Vitals:   12/25/14 1413  BP: 106/66  Pulse: 69  Temp: 98.8 F (37.1 C)  Resp: 15    Physical Exam  Physical Exam  Constitutional: No distress.  Neck: Neck supple.  Cardiovascular: Normal rate and regular rhythm.   Pulmonary/Chest: Breath sounds normal. No respiratory distress. He has no wheezes. He has no rales.  Abdominal: Soft. There is no tenderness. There is no  rebound.  Mild distension   Musculoskeletal:  1+ pedal and lower extremity edema     CBC    Component Value Date/Time   WBC 2.8* 11/22/2014 1439   RBC 2.72* 11/22/2014 1439   RBC 3.31* 10/22/2014 1159  HGB 8.8* 11/22/2014 1439   HCT 25.0* 11/22/2014 1439   PLT 55* 11/22/2014 1439   MCV 91.9 11/22/2014 1439   LYMPHSABS 0.5* 11/19/2014 1310   MONOABS 0.4 11/19/2014 1310   EOSABS 0.2 11/19/2014 1310   BASOSABS 0.0 11/19/2014 1310    CMP     Component Value Date/Time   NA 132* 11/21/2014 0331   K 4.0 11/21/2014 0331   CL 106 11/21/2014 0331   CO2 19* 11/21/2014 0331   GLUCOSE 117* 11/21/2014 0331   BUN 14 11/21/2014 0331   CREATININE 0.99 11/21/2014 0331   CREATININE 0.90 10/22/2014 1159   CALCIUM 7.9* 11/21/2014 0331   PROT 6.3* 11/21/2014 0331   ALBUMIN 2.3* 11/21/2014 0331   AST 50* 11/21/2014 0331   ALT 23 11/21/2014 0331   ALKPHOS 105 11/21/2014 0331   BILITOT 2.4* 11/21/2014 0331   GFRNONAA >60 11/21/2014 0331   GFRNONAA 89 10/22/2014 1159   GFRAA >60 11/21/2014 0331   GFRAA >89 10/22/2014 1159    Lab Results  Component Value Date/Time   CHOL 199 08/15/2014 06:33 AM    Lab Results  Component Value Date/Time   HGBA1C 6.50 12/25/2014 02:17 PM   HGBA1C 6.6* 10/04/2014 07:59 PM    Lab Results  Component Value Date/Time   AST 50* 11/21/2014 03:31 AM    Assessment and Plan  Other specified diabetes mellitus without complications - Plan: Results for orders placed or performed in visit on 12/25/14  HgB A1c  Result Value Ref Range   Hemoglobin A1C 6.50   Diabetes is fairly well controlled, continue with metformin.  Essential hypertension - Plan:blood pressure is controlled currently patient is on Lasix, propranolol, Aldactone, check blood chemistry Basic Metabolic Panel  Cirrhosis of liver with ascites, unspecified hepatic cirrhosis type - Plan:patient is given refill on his medications,  furosemide (LASIX) 40 MG tablet, propranolol (INDERAL) 10 MG  tablet, spironolactone (ALDACTONE) 100 MG tablet, patient to follow with GI.  Hyperlipidemia - Plan: continue with atorvastatin (LIPITOR) 20 MG tablet  Pleural effusion, left Status post thoracocentesis which was transudate, likely secondary to her liver cirrhosis.   Return in about 3 months (around 03/27/2015), or if symptoms worsen or fail to improve.   This note has been created with Surveyor, quantity. Any transcriptional errors are unintentional.    Lorayne Marek, MD

## 2014-12-26 ENCOUNTER — Telehealth: Payer: Self-pay

## 2014-12-26 NOTE — Telephone Encounter (Signed)
-----   Message from Lorayne Marek, MD sent at 12/26/2014 10:20 AM EDT ----- Call and let the patient know that his blood chemistry shows improvement (sodium and calcium), potassium level is in normal range,, continue with current meds.will repeat blood chemistry on the following visit.

## 2014-12-26 NOTE — Telephone Encounter (Signed)
Patient is aware of his lab results 

## 2015-01-02 LAB — AFB CULTURE WITH SMEAR (NOT AT ARMC)
Acid Fast Smear: NONE SEEN
SPECIAL REQUESTS: NORMAL

## 2015-01-06 ENCOUNTER — Encounter (HOSPITAL_BASED_OUTPATIENT_CLINIC_OR_DEPARTMENT_OTHER): Payer: Self-pay | Admitting: Emergency Medicine

## 2015-01-06 ENCOUNTER — Emergency Department (HOSPITAL_BASED_OUTPATIENT_CLINIC_OR_DEPARTMENT_OTHER)
Admission: EM | Admit: 2015-01-06 | Discharge: 2015-01-07 | Disposition: A | Discharge status: Home / Self Care | Payer: Medicare Other | Attending: Emergency Medicine | Admitting: Emergency Medicine

## 2015-01-06 ENCOUNTER — Emergency Department (HOSPITAL_BASED_OUTPATIENT_CLINIC_OR_DEPARTMENT_OTHER): Payer: Medicare Other

## 2015-01-06 DIAGNOSIS — Z8619 Personal history of other infectious and parasitic diseases: Secondary | ICD-10-CM | POA: Insufficient documentation

## 2015-01-06 DIAGNOSIS — W231XXA Caught, crushed, jammed, or pinched between stationary objects, initial encounter: Secondary | ICD-10-CM | POA: Insufficient documentation

## 2015-01-06 DIAGNOSIS — Y92009 Unspecified place in unspecified non-institutional (private) residence as the place of occurrence of the external cause: Secondary | ICD-10-CM | POA: Diagnosis not present

## 2015-01-06 DIAGNOSIS — Y998 Other external cause status: Secondary | ICD-10-CM | POA: Insufficient documentation

## 2015-01-06 DIAGNOSIS — Z87440 Personal history of urinary (tract) infections: Secondary | ICD-10-CM | POA: Diagnosis not present

## 2015-01-06 DIAGNOSIS — Y9301 Activity, walking, marching and hiking: Secondary | ICD-10-CM | POA: Diagnosis not present

## 2015-01-06 DIAGNOSIS — Z8701 Personal history of pneumonia (recurrent): Secondary | ICD-10-CM | POA: Insufficient documentation

## 2015-01-06 DIAGNOSIS — Z79899 Other long term (current) drug therapy: Secondary | ICD-10-CM | POA: Diagnosis not present

## 2015-01-06 DIAGNOSIS — Z7952 Long term (current) use of systemic steroids: Secondary | ICD-10-CM | POA: Diagnosis not present

## 2015-01-06 DIAGNOSIS — F419 Anxiety disorder, unspecified: Secondary | ICD-10-CM | POA: Diagnosis not present

## 2015-01-06 DIAGNOSIS — Z7982 Long term (current) use of aspirin: Secondary | ICD-10-CM | POA: Insufficient documentation

## 2015-01-06 DIAGNOSIS — Z8719 Personal history of other diseases of the digestive system: Secondary | ICD-10-CM | POA: Diagnosis not present

## 2015-01-06 DIAGNOSIS — E780 Pure hypercholesterolemia: Secondary | ICD-10-CM | POA: Insufficient documentation

## 2015-01-06 DIAGNOSIS — F329 Major depressive disorder, single episode, unspecified: Secondary | ICD-10-CM | POA: Insufficient documentation

## 2015-01-06 DIAGNOSIS — S92912B Unspecified fracture of left toe(s), initial encounter for open fracture: Secondary | ICD-10-CM

## 2015-01-06 DIAGNOSIS — S92414B Nondisplaced fracture of proximal phalanx of right great toe, initial encounter for open fracture: Secondary | ICD-10-CM | POA: Insufficient documentation

## 2015-01-06 DIAGNOSIS — Z862 Personal history of diseases of the blood and blood-forming organs and certain disorders involving the immune mechanism: Secondary | ICD-10-CM | POA: Diagnosis not present

## 2015-01-06 DIAGNOSIS — E119 Type 2 diabetes mellitus without complications: Secondary | ICD-10-CM | POA: Diagnosis not present

## 2015-01-06 DIAGNOSIS — S99922A Unspecified injury of left foot, initial encounter: Secondary | ICD-10-CM | POA: Diagnosis present

## 2015-01-06 DIAGNOSIS — I10 Essential (primary) hypertension: Secondary | ICD-10-CM | POA: Insufficient documentation

## 2015-01-06 MED ORDER — LIDOCAINE-EPINEPHRINE 2 %-1:100000 IJ SOLN
20.0000 mL | Freq: Once | INTRAMUSCULAR | Status: AC
  Administered 2015-01-07: 20 mL
  Filled 2015-01-06: qty 1

## 2015-01-06 NOTE — ED Notes (Signed)
I cleaned patient wound with sterile saline, wrap of 4x4's and saline to cover until  X-ray completed. Patient is resting in wheelchair ready for x-ray.

## 2015-01-06 NOTE — ED Notes (Signed)
Awaiting xray

## 2015-01-06 NOTE — ED Notes (Signed)
Patient has laceration to his right great toe. Bleeding controlled. Clean dressing placed

## 2015-01-06 NOTE — ED Notes (Signed)
Wound cleaned by EMT. 

## 2015-01-06 NOTE — ED Provider Notes (Signed)
CSN: [id number]     Arrival date & time 01/06/15  2204 History   This chart was scribed for Merryl Hacker, MD by Hansel Feinstein, ED Scribe. This patient was seen in room MHFT1/MHFT1 and the patient's care was started at 11:09 PM.     Chief Complaint  Patient presents with  . Toe Injury   The history is provided by the patient and the spouse. No language interpreter was used.    HPI Comments: Garrett Turner is a 64 y.o. male with Hx of HTN, HLD, anxiety, DM and neuropathy who presents to the Emergency Department complaining of a laceration with controlled bleeding to the ventral aspect of the right big toe onset earlier this evening. He states associated numbness and sudden onset, moderate shooting pain. Pt reports that he was walking through his house in the dark when he caught his toe on the metal door jam. He states that he lost his balance, but caught himself with his cane. Hx of diabetes with neuropathy. He also states liver problems. Pt notes that he is not on blood thinners. Tetanus is UTD. He denies fall, LOC and head injury.  Past Medical History  Diagnosis Date  . Hypertension   . High cholesterol   . Anxiety disorder   . Pancytopenia   . UTI (lower urinary tract infection)   . Depression   . Anxiety   . Leg cramps   . Type II diabetes mellitus   . Liver cirrhosis secondary to NASH   . Infectious hepatitis 1971  . Pneumonia ~ 07/2014; 09/2014   Past Surgical History  Procedure Laterality Date  . Tonsillectomy    . Esophagogastroduodenoscopy  11/04/2011    Procedure: ESOPHAGOGASTRODUODENOSCOPY (EGD);  Surgeon: Arta Silence, MD;  Location: [facility];  Service: Endoscopy;  Laterality: N/A;  . Colonoscopy  11/04/2011    Procedure: COLONOSCOPY;  Surgeon: Arta Silence, MD;  Location: [facility];  Service: Endoscopy;  Laterality: N/A;  . Vasectomy  1985  . Fixation kyphoplasty lumbar spine      "L5"  . Cardiac catheterization  "years ago"  . Inguinal hernia repair  Bilateral 06/2013   Family History  Problem Relation Age of Onset  . Coronary artery disease    . Hypertension    . Heart failure Father   . Hypertension Father   . Heart disease Father   . Heart failure Mother   . Hypertension Mother   . Heart disease Mother   . Stroke Paternal Grandmother   . Obesity Daughter    History  Substance Use Topics  . Smoking status: Passive Smoke Exposure - Never Smoker    Types: Cigarettes  . Smokeless tobacco: Former Systems developer    Types: Chew     Comment: "used to chew tobacco when I played baseball"  . Alcohol Use: 0.6 oz/week    1 Standard drinks or equivalent per week     Comment: 10/04/2014 "haven't drank anything at all for 6 months because of the cirrhosis; before that a couple drinks/month"    Review of Systems  Musculoskeletal:       Right great toe pain  Skin: Positive for wound.  All other systems reviewed and are negative.     Allergies  Review of patient's allergies indicates no known allergies.  Home Medications   Prior to Admission medications   Medication Sig Start Date End Date Taking? Authorizing Provider  aspirin EC 81 MG EC tablet Take 1 tablet (81 mg total) by mouth daily.  08/24/14   Kinnie Feil, MD  atorvastatin (LIPITOR) 20 MG tablet Take 1 tablet (20 mg total) by mouth daily. 12/25/14   Lorayne Marek, MD  cephALEXin (KEFLEX) 500 MG capsule Take 1 capsule (500 mg total) by mouth 4 (four) times daily. 01/07/15   Merryl Hacker, MD  diphenhydrAMINE-zinc acetate (BENADRYL) cream Apply topically 3 (three) times daily as needed for itching. 08/24/14   Kinnie Feil, MD  escitalopram (LEXAPRO) 20 MG tablet Take 1 tablet (20 mg total) by mouth daily. 08/24/14   Kinnie Feil, MD  feeding supplement, ENSURE COMPLETE, (ENSURE COMPLETE) LIQD Take 237 mLs by mouth 2 (two) times daily between meals. 10/09/14   Silver Huguenin Elgergawy, MD  furosemide (LASIX) 40 MG tablet Take 1.5 tablets (60 mg total) by mouth daily. 12/25/14   Lorayne Marek, MD  hydrocortisone (ANUSOL-HC) 2.5 % rectal cream Apply topically 2 (two) times daily. 08/24/14   Kinnie Feil, MD  lactulose (CHRONULAC) 10 GM/15ML solution Take 30 mLs (20 g total) by mouth daily. 10/09/14   Albertine Patricia, MD  metFORMIN (GLUCOPHAGE) 1000 MG tablet Take 1 tablet (1,000 mg total) by mouth 2 (two) times daily with a meal. 12/25/14   Lorayne Marek, MD  mirtazapine (REMERON SOL-TAB) 15 MG disintegrating tablet Take 1 tablet (15 mg total) by mouth at bedtime. 08/24/14   Kinnie Feil, MD  propranolol (INDERAL) 10 MG tablet Take 1 tablet (10 mg total) by mouth 2 (two) times daily. 12/25/14   Lorayne Marek, MD  spironolactone (ALDACTONE) 100 MG tablet Take 1 tablet (100 mg total) by mouth 2 (two) times daily. 12/25/14   Lorayne Marek, MD  traMADol (ULTRAM) 50 MG tablet Take 1 tablet (50 mg total) by mouth every 6 (six) hours as needed for moderate pain. 01/07/15   Merryl Hacker, MD   BP 103/62 mmHg  Pulse 64  Temp(Src) 97.7 F (36.5 C) (Oral)  Resp 18  Ht 6\' 1"  (1.854 m)  Wt 156 lb (70.761 kg)  BMI 20.59 kg/m2  SpO2 94% Physical Exam  Constitutional: He is oriented to person, place, and time. No distress.  Elderly, chronically ill-appearing, no acute distress  HENT:  Head: Normocephalic and atraumatic.  Cardiovascular: Normal rate and regular rhythm.   Pulmonary/Chest: Effort normal. No respiratory distress.  Musculoskeletal: He exhibits edema.  Trace lower extremity edema Focused examination of the right great toe:  Tenderness over the interphalangeal joint, flexion and extension of the toes appear to be intact, patient reports decreased sensation mostly over the radial aspect of the toe  Neurological: He is alert and oriented to person, place, and time.  Skin: Skin is warm and dry.  4 cm laceration noted over the right great toe just distal to the interphalangeal joint bleeding controlled, flexion  Psychiatric: He has a normal mood and affect.  Nursing note  and vitals reviewed.   ED Course  Procedures (including critical care time)  LACERATION REPAIR Performed by: Merryl Hacker Authorized by: Merryl Hacker Consent: Verbal consent obtained. Risks and benefits: risks, benefits and alternatives were discussed Consent given by: patient Patient identity confirmed: provided demographic data Prepped and Draped in normal sterile fashion Wound explored  Laceration Location: toe  Laceration Length: 4cm  No Foreign Bodies seen or palpated  Anesthesia: local infiltration  Local anesthetic: lidocaine 1% w epinephrine  Anesthetic total: 2 ml  Irrigation method: syringe Amount of cleaning: standard  Skin closure: 4-0 nylon  Number of sutures: 7  Technique: interrupted  Patient tolerance: Patient tolerated the procedure well with no immediate complications.  DIAGNOSTIC STUDIES: Oxygen Saturation is 94% on RA, adequate by my interpretation.    COORDINATION OF CARE: 11:11 PM Discussed treatment plan with pt at bedside and pt agreed to plan.   Labs Review Labs Reviewed - No data to display  Imaging Review Dg Toe Great Right  01/07/2015   CLINICAL DATA:  Patient with laceration to the ventral aspect of the right great toe.  EXAM: RIGHT GREAT TOE  COMPARISON:  None.  FINDINGS: There is mild irregularity about the distal lateral aspect of proximal phalanx of the great toe. Soft tissue swelling along the ventral aspect of the great toe. No evidence for radiopaque foreign body.  IMPRESSION: Mild irregularity along distal lateral aspect of the proximal phalanx of the great toe potentially secondary to degenerative changes at this location. Nondisplaced fracture is not excluded. Recommend correlation with point tenderness.  No definite evidence for radiopaque foreign body.   Electronically Signed   By: Lovey Newcomer M.D.   On: 01/07/2015 00:36     EKG Interpretation None      MDM   Final diagnoses:  Open toe fracture, left,  initial encounter    Patient presents with injury to the right great toe. No other injury. Tetanus up to date. Vital signs are reassuring. Patient reports tingling and numbness of the toe. Unclear if this is related to the acute injury and/or his diabetic neuropathy.  Laceration was cleaned and dressed at the bedside. X-ray with possible nondisplaced fracture. Given point tenderness. Will treat as an open fracture. Patient placed in a postop shoe and will be given Keflex. Wound was sutured at the bedside. Discussed with patient that it was very important that he examine his toe at least twice daily to ensure appropriate healing. He is at great risk for infection. He is to follow-up with orthopedist.  After history, exam, and medical workup I feel the patient has been appropriately medically screened and is safe for discharge home. Pertinent diagnoses were discussed with the patient. Patient was given return precautions.  I personally performed the services described in this documentation, which was scribed in my presence. The recorded information has been reviewed and is accurate.   Merryl Hacker, MD 01/07/15 [phone]

## 2015-01-06 NOTE — ED Notes (Signed)
Returned from xray

## 2015-01-06 NOTE — ED Notes (Signed)
Patient states that he tripped and hurt his right big toe

## 2015-01-07 ENCOUNTER — Telehealth: Payer: Self-pay | Admitting: Internal Medicine

## 2015-01-07 MED ORDER — TRAMADOL HCL 50 MG PO TABS: 50.0000 mg | ORAL_TABLET | Freq: Four times a day (QID) | ORAL | Status: DC | PRN

## 2015-01-07 MED ORDER — CEPHALEXIN 500 MG PO CAPS: 500.0000 mg | ORAL_CAPSULE | Freq: Four times a day (QID) | ORAL | Status: DC

## 2015-01-07 NOTE — Discharge Instructions (Signed)
You have a small irregularity on your xray which may represent a fracture.  Because of the laceration on the top of your foot, this will be treated as an open fracture and you need antibiotics and orthopedic follow-up. Because you're a diabetic, you need to check her toe several times a day for signs and symptoms of infection including redness, drainage or increasing pain at the site.  Toe Fracture Your caregiver has diagnosed you as having a fractured toe. A toe fracture is a break in the bone of a toe. "Buddy taping" is a way of splinting your broken toe, by taping the broken toe to the toe next to it. This "buddy taping" will keep the injured toe from moving beyond normal range of motion. Buddy taping also helps the toe heal in a more normal alignment. It may take 6 to 8 weeks for the toe injury to heal. [place] your toes taped together for as long as directed by your caregiver or until you see a doctor for a follow-up examination. You can change the tape after bathing. Always use a small piece of gauze or cotton between the toes when taping them together. This will help the skin stay dry and prevent infection.  Apply ice to the injury for 15-20 minutes each hour while awake for the first 2 days. Put the ice in a plastic bag and place a towel between the bag of ice and your skin.  After the first 2 days, apply heat to the injured area. Use heat for the next 2 to 3 days. Place a heating pad on the foot or soak the foot in warm water as directed by your caregiver.  Keep your foot elevated as much as possible to lessen swelling.  Wear sturdy, supportive shoes. The shoes should not pinch the toes or fit tightly against the toes.  Your caregiver may prescribe a rigid shoe if your foot is very swollen.  Your may be given crutches if the pain is too great and it hurts too much to walk.  Only take over-the-counter or prescription medicines for pain, discomfort, or fever as  directed by your caregiver.  If your caregiver has given you a follow-up appointment, it is very important to keep that appointment. Not keeping the appointment could result in a chronic or permanent injury, pain, and disability. If there is any problem keeping the appointment, you must call back to this facility for assistance. SEEK MEDICAL CARE IF:   You have increased pain or swelling, not relieved with medications.  The pain does not get better after 1 week.  Your injured toe is cold when the others are warm. SEEK IMMEDIATE MEDICAL CARE IF:   The toe becomes cold, numb, or white.  The toe becomes hot (inflamed) and red. Document Released: 07/03/2000 Document Revised: 09/28/2011 Document Reviewed: 02/20/2008 [facility] Patient Information 2015 [place], [place]. This information is not intended to replace advice given to you by your health care provider. Make sure you discuss any questions you have with your health care provider.

## 2015-01-07 NOTE — Telephone Encounter (Signed)
Called patient ,application for disability parking placard is ready for pick-up

## 2015-01-07 NOTE — ED Notes (Signed)
MD with pt  

## 2015-01-18 ENCOUNTER — Telehealth: Payer: Self-pay

## 2015-01-18 NOTE — Telephone Encounter (Signed)
Yes, but patient need OV for O2 check Resting O2 Ambulatory O2  And O2 with supplemental oxygen  This should be an MD visit for documentation purposes

## 2015-01-18 NOTE — Telephone Encounter (Signed)
Spoke with amanda who is a nurse that visits with Garrett Turner At her most recent visit she noticed his O2 stats having been dropping and  Even more so when he gets up to walk with the lowest  Being at [place] is requesting an order for O2 in the home Is this something we can do

## 2015-01-24 ENCOUNTER — Inpatient Hospital Stay (HOSPITAL_COMMUNITY)
Admission: EM | Admit: 2015-01-24 | Discharge: 2015-01-31 | DRG: 186 | Disposition: A | Discharge status: Skilled Nursing Facility | Payer: Medicare Other | Attending: Internal Medicine | Admitting: Internal Medicine

## 2015-01-24 ENCOUNTER — Telehealth: Payer: Self-pay

## 2015-01-24 ENCOUNTER — Emergency Department (HOSPITAL_COMMUNITY): Payer: Medicare Other

## 2015-01-24 ENCOUNTER — Encounter (HOSPITAL_COMMUNITY): Payer: Self-pay | Admitting: Radiology

## 2015-01-24 DIAGNOSIS — E222 Syndrome of inappropriate secretion of antidiuretic hormone: Secondary | ICD-10-CM | POA: Diagnosis present

## 2015-01-24 DIAGNOSIS — K7290 Hepatic failure, unspecified without coma: Secondary | ICD-10-CM | POA: Diagnosis present

## 2015-01-24 DIAGNOSIS — K7460 Unspecified cirrhosis of liver: Secondary | ICD-10-CM

## 2015-01-24 DIAGNOSIS — R0602 Shortness of breath: Secondary | ICD-10-CM

## 2015-01-24 DIAGNOSIS — R0600 Dyspnea, unspecified: Secondary | ICD-10-CM

## 2015-01-24 DIAGNOSIS — K766 Portal hypertension: Secondary | ICD-10-CM | POA: Diagnosis present

## 2015-01-24 DIAGNOSIS — Z823 Family history of stroke: Secondary | ICD-10-CM

## 2015-01-24 DIAGNOSIS — E8809 Other disorders of plasma-protein metabolism, not elsewhere classified: Secondary | ICD-10-CM | POA: Diagnosis present

## 2015-01-24 DIAGNOSIS — Z792 Long term (current) use of antibiotics: Secondary | ICD-10-CM

## 2015-01-24 DIAGNOSIS — R188 Other ascites: Secondary | ICD-10-CM | POA: Diagnosis present

## 2015-01-24 DIAGNOSIS — K7581 Nonalcoholic steatohepatitis (NASH): Secondary | ICD-10-CM | POA: Diagnosis present

## 2015-01-24 DIAGNOSIS — R0902 Hypoxemia: Secondary | ICD-10-CM | POA: Insufficient documentation

## 2015-01-24 DIAGNOSIS — F17210 Nicotine dependence, cigarettes, uncomplicated: Secondary | ICD-10-CM | POA: Diagnosis present

## 2015-01-24 DIAGNOSIS — J90 Pleural effusion, not elsewhere classified: Secondary | ICD-10-CM | POA: Diagnosis not present

## 2015-01-24 DIAGNOSIS — F329 Major depressive disorder, single episode, unspecified: Secondary | ICD-10-CM | POA: Diagnosis present

## 2015-01-24 DIAGNOSIS — E119 Type 2 diabetes mellitus without complications: Secondary | ICD-10-CM

## 2015-01-24 DIAGNOSIS — Z79899 Other long term (current) drug therapy: Secondary | ICD-10-CM

## 2015-01-24 DIAGNOSIS — D649 Anemia, unspecified: Secondary | ICD-10-CM | POA: Diagnosis present

## 2015-01-24 DIAGNOSIS — E118 Type 2 diabetes mellitus with unspecified complications: Secondary | ICD-10-CM | POA: Diagnosis not present

## 2015-01-24 DIAGNOSIS — Z7982 Long term (current) use of aspirin: Secondary | ICD-10-CM

## 2015-01-24 DIAGNOSIS — R627 Adult failure to thrive: Secondary | ICD-10-CM | POA: Diagnosis present

## 2015-01-24 DIAGNOSIS — Z8249 Family history of ischemic heart disease and other diseases of the circulatory system: Secondary | ICD-10-CM

## 2015-01-24 DIAGNOSIS — R5381 Other malaise: Secondary | ICD-10-CM | POA: Diagnosis present

## 2015-01-24 DIAGNOSIS — K7681 Hepatopulmonary syndrome: Secondary | ICD-10-CM | POA: Insufficient documentation

## 2015-01-24 DIAGNOSIS — I10 Essential (primary) hypertension: Secondary | ICD-10-CM | POA: Diagnosis present

## 2015-01-24 DIAGNOSIS — Z66 Do not resuscitate: Secondary | ICD-10-CM | POA: Diagnosis not present

## 2015-01-24 DIAGNOSIS — J948 Other specified pleural conditions: Secondary | ICD-10-CM

## 2015-01-24 DIAGNOSIS — Z515 Encounter for palliative care: Secondary | ICD-10-CM | POA: Insufficient documentation

## 2015-01-24 DIAGNOSIS — E43 Unspecified severe protein-calorie malnutrition: Secondary | ICD-10-CM | POA: Diagnosis present

## 2015-01-24 DIAGNOSIS — E785 Hyperlipidemia, unspecified: Secondary | ICD-10-CM | POA: Diagnosis present

## 2015-01-24 DIAGNOSIS — F419 Anxiety disorder, unspecified: Secondary | ICD-10-CM | POA: Diagnosis present

## 2015-01-24 DIAGNOSIS — D61818 Other pancytopenia: Secondary | ICD-10-CM | POA: Insufficient documentation

## 2015-01-24 DIAGNOSIS — Z7722 Contact with and (suspected) exposure to environmental tobacco smoke (acute) (chronic): Secondary | ICD-10-CM | POA: Diagnosis present

## 2015-01-24 DIAGNOSIS — Z4802 Encounter for removal of sutures: Secondary | ICD-10-CM

## 2015-01-24 LAB — CBC
HCT: 28.8 % — ABNORMAL LOW (ref 39.0–52.0)
HEMOGLOBIN: 10 g/dL — AB (ref 13.0–17.0)
MCH: 31.5 pg (ref 26.0–34.0)
MCHC: 34.7 g/dL (ref 30.0–36.0)
MCV: 90.9 fL (ref 78.0–100.0)
Platelets: 69 10*3/uL — ABNORMAL LOW (ref 150–400)
RBC: 3.17 MIL/uL — AB (ref 4.22–5.81)
RDW: 16.3 % — ABNORMAL HIGH (ref 11.5–15.5)
WBC: 4.6 10*3/uL (ref 4.0–10.5)

## 2015-01-24 LAB — COMPREHENSIVE METABOLIC PANEL
ALK PHOS: 88 U/L (ref 38–126)
ALT: 26 U/L (ref 17–63)
ANION GAP: 12 (ref 5–15)
AST: 46 U/L — ABNORMAL HIGH (ref 15–41)
Albumin: 2.5 g/dL — ABNORMAL LOW (ref 3.5–5.0)
BILIRUBIN TOTAL: 2.4 mg/dL — AB (ref 0.3–1.2)
BUN: 24 mg/dL — AB (ref 6–20)
CHLORIDE: 102 mmol/L (ref 101–111)
CO2: 18 mmol/L — ABNORMAL LOW (ref 22–32)
Calcium: 8.9 mg/dL (ref 8.9–10.3)
Creatinine, Ser: 1.24 mg/dL (ref 0.61–1.24)
GFR calc non Af Amer: 59 mL/min — ABNORMAL LOW (ref >60)
Glucose, Bld: 140 mg/dL — ABNORMAL HIGH (ref 65–99)
Potassium: 3.9 mmol/L (ref 3.5–5.1)
Sodium: 132 mmol/L — ABNORMAL LOW (ref 135–145)
Total Protein: 7.5 g/dL (ref 6.5–8.1)

## 2015-01-24 LAB — TROPONIN I
Troponin I: <0.03 ng/mL (ref <0.031)
Troponin I: <0.03 ng/mL (ref <0.031)

## 2015-01-24 LAB — GLUCOSE, CAPILLARY: Glucose-Capillary: 181 mg/dL — ABNORMAL HIGH (ref 65–99)

## 2015-01-24 LAB — BRAIN NATRIURETIC PEPTIDE: B Natriuretic Peptide: 56.2 pg/mL (ref 0.0–100.0)

## 2015-01-24 MED ORDER — INSULIN ASPART 100 UNIT/ML ~~LOC~~ SOLN
0.0000 [IU] | Freq: Every day | SUBCUTANEOUS | Status: DC
  Administered 2015-01-26 – 2015-01-27 (×2): 2 [IU] via SUBCUTANEOUS
  Administered 2015-01-28 – 2015-01-30 (×3): 3 [IU] via SUBCUTANEOUS

## 2015-01-24 MED ORDER — ALUM & MAG HYDROXIDE-SIMETH 200-200-20 MG/5ML PO SUSP: 30.0000 mL | Freq: Four times a day (QID) | ORAL | Status: DC | PRN

## 2015-01-24 MED ORDER — SODIUM CHLORIDE 0.9 % IV BOLUS (SEPSIS)
500.0000 mL | Freq: Once | INTRAVENOUS | Status: AC
  Administered 2015-01-24: 500 mL via INTRAVENOUS

## 2015-01-24 MED ORDER — HYDROMORPHONE HCL 1 MG/ML IJ SOLN: 1.0000 mg | INTRAMUSCULAR | Status: DC | PRN

## 2015-01-24 MED ORDER — INSULIN ASPART 100 UNIT/ML ~~LOC~~ SOLN
0.0000 [IU] | Freq: Three times a day (TID) | SUBCUTANEOUS | Status: DC
  Administered 2015-01-25 (×2): 2 [IU] via SUBCUTANEOUS
  Administered 2015-01-26: 6 [IU] via SUBCUTANEOUS
  Administered 2015-01-26: 1 [IU] via SUBCUTANEOUS
  Administered 2015-01-26: 2 [IU] via SUBCUTANEOUS
  Administered 2015-01-27: 1 [IU] via SUBCUTANEOUS
  Administered 2015-01-27: 2 [IU] via SUBCUTANEOUS
  Administered 2015-01-27: 3 [IU] via SUBCUTANEOUS
  Administered 2015-01-28 (×2): 1 [IU] via SUBCUTANEOUS
  Administered 2015-01-28: 2 [IU] via SUBCUTANEOUS
  Administered 2015-01-29: 1 [IU] via SUBCUTANEOUS
  Administered 2015-01-29 – 2015-01-30 (×3): 2 [IU] via SUBCUTANEOUS
  Administered 2015-01-30: 1 [IU] via SUBCUTANEOUS
  Administered 2015-01-30: 3 [IU] via SUBCUTANEOUS
  Administered 2015-01-31: 2 [IU] via SUBCUTANEOUS
  Administered 2015-01-31: 1 [IU] via SUBCUTANEOUS

## 2015-01-24 MED ORDER — ONDANSETRON HCL 4 MG PO TABS: 4.0000 mg | ORAL_TABLET | Freq: Four times a day (QID) | ORAL | Status: DC | PRN

## 2015-01-24 MED ORDER — ALBUTEROL SULFATE (2.5 MG/3ML) 0.083% IN NEBU
2.5000 mg | INHALATION_SOLUTION | Freq: Once | RESPIRATORY_TRACT | Status: AC
  Administered 2015-01-24: 2.5 mg via RESPIRATORY_TRACT
  Filled 2015-01-24: qty 3

## 2015-01-24 MED ORDER — ACETAMINOPHEN 650 MG RE SUPP
650.0000 mg | Freq: Four times a day (QID) | RECTAL | Status: DC | PRN
Start: 2015-01-24 — End: 2015-01-31

## 2015-01-24 MED ORDER — ONDANSETRON HCL 4 MG/2ML IJ SOLN: 4.0000 mg | Freq: Four times a day (QID) | INTRAMUSCULAR | Status: DC | PRN

## 2015-01-24 MED ORDER — TRAMADOL HCL 50 MG PO TABS: 50.0000 mg | ORAL_TABLET | Freq: Four times a day (QID) | ORAL | Status: DC | PRN

## 2015-01-24 MED ORDER — SENNA 8.6 MG PO TABS
1.0000 | ORAL_TABLET | Freq: Every day | ORAL | Status: DC
  Administered 2015-01-25 – 2015-01-31 (×7): 8.6 mg via ORAL
  Filled 2015-01-24 (×7): qty 1

## 2015-01-24 MED ORDER — ACETAMINOPHEN 325 MG PO TABS: 650.0000 mg | ORAL_TABLET | Freq: Four times a day (QID) | ORAL | Status: DC | PRN

## 2015-01-24 MED ORDER — ATORVASTATIN CALCIUM 20 MG PO TABS
20.0000 mg | ORAL_TABLET | Freq: Every day | ORAL | Status: DC
  Administered 2015-01-25 – 2015-01-31 (×7): 20 mg via ORAL
  Filled 2015-01-24 (×7): qty 1

## 2015-01-24 MED ORDER — FUROSEMIDE 40 MG PO TABS
40.0000 mg | ORAL_TABLET | Freq: Every day | ORAL | Status: DC
  Filled 2015-01-24: qty 1

## 2015-01-24 MED ORDER — SODIUM CHLORIDE 0.9 % IJ SOLN
3.0000 mL | Freq: Two times a day (BID) | INTRAMUSCULAR | Status: DC
  Administered 2015-01-25 – 2015-01-30 (×11): 3 mL via INTRAVENOUS

## 2015-01-24 MED ORDER — IOHEXOL 350 MG/ML SOLN
100.0000 mL | Freq: Once | INTRAVENOUS | Status: AC | PRN
  Administered 2015-01-24: 100 mL via INTRAVENOUS

## 2015-01-24 MED ORDER — HYDROCODONE-ACETAMINOPHEN 5-325 MG PO TABS
1.0000 | ORAL_TABLET | ORAL | Status: DC | PRN
  Administered 2015-01-28 – 2015-01-29 (×4): 1 via ORAL
  Administered 2015-01-30: 2 via ORAL
  Filled 2015-01-24: qty 1
  Filled 2015-01-24: qty 2
  Filled 2015-01-24 (×3): qty 1

## 2015-01-24 NOTE — ED Notes (Signed)
Pt O2sat 98% on RA while lying in bed, as soon as pt stood up to start walking O2sat decreases to 85% RA.

## 2015-01-24 NOTE — ED Notes (Signed)
RA saturation 88%. PT taken back to triage room and placed on oxygen. Triage RN aware.

## 2015-01-24 NOTE — ED Provider Notes (Signed)
4:25 PM Patient signed out to me by Patel-Mills, PA-C.  Patient has been SOB for the past 10 days.  CT angio and delta troponin is pending.  DC to home if negative.    6:13 PM Patient states that he still feels extremely SOB, has been having worsening symptoms.  Labs and imaging are reassuring, however, patient cannot ambulate without becoming extremely fatigued and SOB.  Patient's O2 sats drops to 85% when he stands up.  He was unable to ambulate 2/2 his shortness of breath.  Discussed with Dr. Mingo Amber, who agrees with plan for admission for hypoxia and SOB.  PCP: Dr. Vernon Prey at [facility].  Appreciate Dr. Tana Coast for admitting the patient.  Montine Circle, PA-C 01/25/15 0101  Evelina Bucy, MD 01/25/15 [phone]

## 2015-01-24 NOTE — H&P (Signed)
History and Physical        Hospital Admission Note Date: 01/24/2015  Patient name: Garrett Turner Medical record number: [id number] Date of birth: March 10, 1951 Age: 64 y.o. Gender: male  PCP: Lorayne Marek, MD  Referring physician: Lorre Munroe  Chief Complaint:  Shortness of breath progressively worsening over the last 10 days  HPI: Patient is a 64 year old male with hypertension, hyperlipidemia, anxiety, type 2 diabetes mellitus, liver cirrhosis due to NASH presented to ED with shortness of breath, worsening over the last 10 days. History was obtained from the patient who reported that he has chronic shortness of breath however in the last 10 days it has been getting progressively worse. He states that he gets short of breath from walking from his bedroom to the bathroom or getting the food from the kitchen. He lives at home alone and uses cane or walker when ambulating. Patient says he has off and on cough with clear sputum. He gets chest tightness when he gets very short of breath on ambulation. He denies any fevers or chills. He has been sleeping on his couch for the last 10 days as it is closer to the bathroom and the kitchen. Patient reports that he is in process of getting home O2 setup [facility]. He denies any fevers or chills. He denies any abdominal bloating or weight gain, reports actually he has lost weight, no change in his appetite. In ED, patient's O2 sats were 98% on room air however when he was stood up to walk, O2 sats dropped to 85% on room air and hospitalist service was requested for admission. ED workup showed troponins 2 negative, hemoglobin 10, platelets 69, albumen 2.5, BMET unremarkable Chest x-ray negative for any pneumonia. CT angiographic the chest was negative for any pulmonary emboli, small to moderate-sized left pleural  effusion, changes of cirrhosis of liver with portal venous hypertension, varices and ascites.   Review of Systems:  Constitutional: Denies fever, chills, diaphoresis, poor appetite and + fatigue.  HEENT: Denies photophobia, eye pain, redness, hearing loss, ear pain, congestion, sore throat, rhinorrhea, sneezing, mouth sores, trouble swallowing, neck pain, neck stiffness and tinnitus.   Respiratory: Please see history of present illness Cardiovascular: Please see history of present illness Gastrointestinal: Denies nausea, vomiting, abdominal pain, diarrhea, constipation, blood in stool and abdominal distention.  Genitourinary: Denies dysuria, urgency, frequency, hematuria, flank pain and difficulty urinating.  Musculoskeletal: Denies myalgias, back pain, joint swelling, arthralgias and gait problem.  Skin: Denies pallor, rash and wound.  Neurological: Denies dizziness, seizures, syncope, light-headedness, numbness and headaches. + generalized weakness Hematological: Denies adenopathy. Easy bruising, personal or family bleeding history  Psychiatric/Behavioral: Denies suicidal ideation, mood changes, confusion, nervousness, sleep disturbance and agitation  Past Medical History: Past Medical History  Diagnosis Date  . Hypertension   . High cholesterol   . Anxiety disorder   . Pancytopenia   . UTI (lower urinary tract infection)   . Depression   . Anxiety   . Leg cramps   . Type II diabetes mellitus   . Liver cirrhosis secondary to NASH   . Infectious hepatitis 1971  . Pneumonia ~ 07/2014; 09/2014  Past Surgical History  Procedure Laterality Date  . Tonsillectomy    . Esophagogastroduodenoscopy  11/04/2011    Procedure: ESOPHAGOGASTRODUODENOSCOPY (EGD);  Surgeon: Arta Silence, MD;  Location: [facility];  Service: Endoscopy;  Laterality: N/A;  . Colonoscopy  11/04/2011    Procedure: COLONOSCOPY;  Surgeon: Arta Silence, MD;  Location: [facility];  Service: Endoscopy;   Laterality: N/A;  . Vasectomy  1985  . Fixation kyphoplasty lumbar spine      "L5"  . Cardiac catheterization  "years ago"  . Inguinal hernia repair Bilateral 06/2013    Medications: Prior to Admission medications   Medication Sig Start Date End Date Taking? Authorizing Provider  atorvastatin (LIPITOR) 20 MG tablet Take 1 tablet (20 mg total) by mouth daily. 12/25/14  Yes Lorayne Marek, MD  diphenhydrAMINE-zinc acetate (BENADRYL) cream Apply topically 3 (three) times daily as needed for itching. 08/24/14  Yes Kinnie Feil, MD  furosemide (LASIX) 40 MG tablet Take 1.5 tablets (60 mg total) by mouth daily. 12/25/14  Yes Lorayne Marek, MD  metFORMIN (GLUCOPHAGE) 1000 MG tablet Take 1 tablet (1,000 mg total) by mouth 2 (two) times daily with a meal. 12/25/14  Yes Deepak Advani, MD  propranolol (INDERAL) 10 MG tablet Take 1 tablet (10 mg total) by mouth 2 (two) times daily. 12/25/14  Yes Lorayne Marek, MD  senna (SENOKOT) 8.6 MG tablet Take 1 tablet by mouth daily.   Yes Historical Provider, MD  spironolactone (ALDACTONE) 100 MG tablet Take 1 tablet (100 mg total) by mouth 2 (two) times daily. 12/25/14  Yes Lorayne Marek, MD  traMADol (ULTRAM) 50 MG tablet Take 1 tablet (50 mg total) by mouth every 6 (six) hours as needed for moderate pain. 01/07/15  Yes Merryl Hacker, MD  aspirin EC 81 MG EC tablet Take 1 tablet (81 mg total) by mouth daily. 08/24/14   Kinnie Feil, MD  cephALEXin (KEFLEX) 500 MG capsule Take 1 capsule (500 mg total) by mouth 4 (four) times daily. 01/07/15   Merryl Hacker, MD  escitalopram (LEXAPRO) 20 MG tablet Take 1 tablet (20 mg total) by mouth daily. 08/24/14   Kinnie Feil, MD  feeding supplement, ENSURE COMPLETE, (ENSURE COMPLETE) LIQD Take 237 mLs by mouth 2 (two) times daily between meals. 10/09/14   Silver Huguenin Elgergawy, MD  hydrocortisone (ANUSOL-HC) 2.5 % rectal cream Apply topically 2 (two) times daily. 08/24/14   Kinnie Feil, MD  lactulose (CHRONULAC) 10 GM/15ML  solution Take 30 mLs (20 g total) by mouth daily. 10/09/14   Silver Huguenin Elgergawy, MD  mirtazapine (REMERON SOL-TAB) 15 MG disintegrating tablet Take 1 tablet (15 mg total) by mouth at bedtime. 08/24/14   Kinnie Feil, MD    Allergies:  No Known Allergies  Social History:  reports that he has been passively smoking Cigarettes.  He has quit using smokeless tobacco. His smokeless tobacco use included Chew. He reports that he drinks about 0.6 oz of alcohol per week. He reports that he does not use illicit drugs.  Family History: Family History  Problem Relation Age of Onset  . Coronary artery disease    . Hypertension    . Heart failure Father   . Hypertension Father   . Heart disease Father   . Heart failure Mother   . Hypertension Mother   . Heart disease Mother   . Stroke Paternal Grandmother   . Obesity Daughter     Physical Exam: Blood pressure 94/55, pulse 65, temperature 97.7 F (  36.5 C), temperature source Oral, resp. rate 18, height 6\' 1"  (1.854 m), weight 67.586 kg (149 lb), SpO2 94 %. General: Alert, awake, oriented x3, in no acute distress. HEENT: normocephalic, atraumatic, anicteric sclera, pink conjunctiva, pupils equal and reactive to light and accomodation, oropharynx clear Neck: supple, no masses or lymphadenopathy, no goiter, no bruits  Heart: Regular rate and rhythm, without murmurs, rubs or gallops. Lungs: Decreased breath sound at the bases L>R Abdomen: Soft, nontender, nondistended, positive bowel sounds, no masses. Extremities: No clubbing, cyanosis or edema with positive pedal pulses. Neuro: Grossly intact, no focal neurological deficits, strength 5/5 upper and lower extremities bilaterally Psych: alert and oriented x 3, normal mood and affect Skin: no rashes or lesions, warm and dry   LABS on Admission:  Basic Metabolic Panel:  Recent Labs Lab 01/24/15 1352  NA 132*  K 3.9  CL 102  CO2 18*  GLUCOSE 140*  BUN 24*  CREATININE 1.24  CALCIUM 8.9     Liver Function Tests:  Recent Labs Lab 01/24/15 1352  AST 46*  ALT 26  ALKPHOS 88  BILITOT 2.4*  PROT 7.5  ALBUMIN 2.5*   No results for input(s): LIPASE, AMYLASE in the last 168 hours. No results for input(s): AMMONIA in the last 168 hours. CBC:  Recent Labs Lab 01/24/15 1352  WBC 4.6  HGB 10.0*  HCT 28.8*  MCV 90.9  PLT 69*   Cardiac Enzymes:  Recent Labs Lab 01/24/15 1352 01/24/15 1620  TROPONINI <0.03 <0.03   BNP: Invalid input(s): POCBNP CBG: No results for input(s): GLUCAP in the last 168 hours.  Radiological Exams on Admission:  Dg Chest 2 View  01/24/2015   CLINICAL DATA:  Short of breath  EXAM: CHEST  2 VIEW  COMPARISON:  11/20/2014  FINDINGS: Prominent interstitial lung markings peripherally bilaterally most consistent with chronic lung disease and scarring. Decreased lung volume as noted previously.  Negative for superimposed pneumonia.  No edema or effusion.  IMPRESSION: Chronic lung disease with scarring. No superimposed acute abnormality.   Electronically Signed   By: Franchot Gallo M.D.   On: 01/24/2015 15:34   Ct Angio Chest Pe W/cm &/or Wo Cm  01/24/2015   CLINICAL DATA:  Shortness of breath.  Weakness.  EXAM: CT ANGIOGRAPHY CHEST WITH CONTRAST  TECHNIQUE: Multidetector CT imaging of the chest was performed using the standard protocol during bolus administration of intravenous contrast. Multiplanar CT image reconstructions and MIPs were obtained to evaluate the vascular anatomy.  CONTRAST:  155mL OMNIPAQUE IOHEXOL 350 MG/ML SOLN  COMPARISON:  Chest radiographs obtained earlier today. Chest CTA dated 11/19/2014.  FINDINGS: Normally opacified pulmonary arteries with no pulmonary arterial filling defects seen. No significant change in prominence of the interstitial markings in the periphery of both lungs. Small to moderate-sized left pleural effusion with an interval decrease in size.  Moderate amount of free peritoneal fluid in the upper abdomen. The liver  contours are mildly lobulated with a small right lobe and enlarged lateral segment left lobe and caudate lobe. The spleen is also diffusely enlarged. Upper abdominal varices are also demonstrated. Thoracic spine degenerative changes.  Review of the MIP images confirms the above findings.  IMPRESSION: 1. No pulmonary emboli a or acute abnormality. 2. Small to moderate-sized left pleural effusion, decreased. 3. Chronic peripheral interstitial lung disease. 4. Changes of cirrhosis of the liver with portal venous hypertension, varices and ascites.   Electronically Signed   By: Claudie Revering M.D.   On: 01/24/2015 17:40  Dg Toe Great Right  01/07/2015   CLINICAL DATA:  Patient with laceration to the ventral aspect of the right great toe.  EXAM: RIGHT GREAT TOE  COMPARISON:  None.  FINDINGS: There is mild irregularity about the distal lateral aspect of proximal phalanx of the great toe. Soft tissue swelling along the ventral aspect of the great toe. No evidence for radiopaque foreign body.  IMPRESSION: Mild irregularity along distal lateral aspect of the proximal phalanx of the great toe potentially secondary to degenerative changes at this location. Nondisplaced fracture is not excluded. Recommend correlation with point tenderness.  No definite evidence for radiopaque foreign body.   Electronically Signed   By: Lovey Newcomer M.D.   On: 01/07/2015 00:36    *I have personally reviewed the images above*  EKG: Independently reviewed. Rate 82, normal sinus rhythm, no ST-T wave changes of ischemia   Assessment/Plan Principal Problem:   Dyspnea: Multifactorial, possibly due to left pleural effusion, diastolic CHF, has a history of cirrhosis. EF 60-65% in 3/16 echo - Admit to telemetry, rule out acute ACS, BNP normal, no signs of any florid CHF -  thoracentesis in a.m. 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    Time Spent on Admission: 60 mins   RAI,RIPUDEEP M.D. [facility] 01/24/2015, 7:46 PM Pager: [phone]  If 7PM-7AM, please contact night-coverage [email or web address] Password [email or web address]

## 2015-01-24 NOTE — ED Notes (Signed)
Admitting at bedside 

## 2015-01-24 NOTE — Telephone Encounter (Signed)
Spoke with Garrett Turner [phone] Visiting nurse-patient has been having some confusion Very short of breath o2 stats have been running low when he Ambulates Advised patient and nurse that he should be evaluated in the ED

## 2015-01-24 NOTE — ED Notes (Signed)
Pt c/o inability to breath after taking 10 steps.  Hx of fluid build-up r/t liver failure.  Sats of 86 on RA that improved to 92 on 2 L.  C/o some L lower back pain.

## 2015-01-24 NOTE — ED Provider Notes (Signed)
CSN: [id number]     Arrival date & time 01/24/15  1334 History   First MD Initiated Contact with Patient 01/24/15 1406     Chief Complaint  Patient presents with  . Shortness of Breath     (Consider location/radiation/quality/duration/timing/severity/associated sxs/prior Treatment) Patient is a 64 y.o. male presenting with shortness of breath. The history is provided by the patient. No language interpreter was used.  Shortness of Breath Associated symptoms: cough   Associated symptoms: no chest pain, no fever and no wheezing   Garrett Turner is a  64 year old male with a history of hypertension, hyperlipidemia , diabetes, liver cirrhosis, and hepatitis who presents for 10 days of worsening shortness of breath. He states it is even difficult for him to get up and walk to the bathroom or get food from the kitchen without being out of breath and feeling lightheaded. He states that he has a productive clear sputum cough but not often. He states he basically coughs to clear his throat. He also states that his chest feels tight and that he has had this in the past several times and that his oxygen runs in the 80s. He is in the process of getting home oxygen. He states he recently hit his right great toe on something at home and had to have sutures placed. He denies any fever, chills , chest pain , palpitations, abdominal pain, nausea, vomiting, diarrhea , leg swelling. Past Medical History  Diagnosis Date  . Hypertension   . High cholesterol   . Anxiety disorder   . Pancytopenia   . UTI (lower urinary tract infection)   . Depression   . Anxiety   . Leg cramps   . Type II diabetes mellitus   . Liver cirrhosis secondary to NASH   . Infectious hepatitis 1971  . Pneumonia ~ 07/2014; 09/2014   Past Surgical History  Procedure Laterality Date  . Tonsillectomy    . Esophagogastroduodenoscopy  11/04/2011    Procedure: ESOPHAGOGASTRODUODENOSCOPY (EGD);  Surgeon: Arta Silence, MD;  Location: [facility];  Service: Endoscopy;  Laterality: N/A;  . Colonoscopy  11/04/2011    Procedure: COLONOSCOPY;  Surgeon: Arta Silence, MD;  Location: [facility]COPY;  Service: Endoscopy;  Laterality: N/A;  . Vasectomy  1985  . Fixation kyphoplasty lumbar spine      "L5"  . Cardiac catheterization  "years ago"  . Inguinal hernia repair Bilateral 06/2013   Family History  Problem Relation Age of Onset  . Coronary artery disease    . Hypertension    . Heart failure Father   . Hypertension Father   . Heart disease Father   . Heart failure Mother   . Hypertension Mother   . Heart disease Mother   . Stroke Paternal Grandmother   . Obesity Daughter    History  Substance Use Topics  . Smoking status: Passive Smoke Exposure - Never Smoker    Types: Cigarettes  . Smokeless tobacco: Former Systems developer    Types: Chew     Comment: "used to chew tobacco when I played baseball"  . Alcohol Use: 0.6 oz/week    1 Standard drinks or equivalent per week     Comment: 10/04/2014 "haven't drank anything at all for 6 months because of the cirrhosis; before that a couple drinks/month"    Review of Systems  Constitutional: Negative for fever.  Respiratory: Positive for cough, chest tightness and shortness of breath. Negative for wheezing.   Cardiovascular: Negative for chest pain, palpitations and leg  swelling.  Skin: Positive for wound.  All other systems reviewed and are negative.     Allergies  Review of patient's allergies indicates no known allergies.  Home Medications   Prior to Admission medications   Medication Sig Start Date End Date Taking? Authorizing Provider  atorvastatin (LIPITOR) 20 MG tablet Take 1 tablet (20 mg total) by mouth daily. 12/25/14  Yes Lorayne Marek, MD  diphenhydrAMINE-zinc acetate (BENADRYL) cream Apply topically 3 (three) times daily as needed for itching. 08/24/14  Yes Kinnie Feil, MD  furosemide (LASIX) 40 MG tablet Take 1.5 tablets (60 mg total) by mouth daily. 12/25/14   Yes Lorayne Marek, MD  metFORMIN (GLUCOPHAGE) 1000 MG tablet Take 1 tablet (1,000 mg total) by mouth 2 (two) times daily with a meal. 12/25/14  Yes Deepak Advani, MD  propranolol (INDERAL) 10 MG tablet Take 1 tablet (10 mg total) by mouth 2 (two) times daily. 12/25/14  Yes Lorayne Marek, MD  senna (SENOKOT) 8.6 MG tablet Take 1 tablet by mouth daily.   Yes Historical Provider, MD  spironolactone (ALDACTONE) 100 MG tablet Take 1 tablet (100 mg total) by mouth 2 (two) times daily. 12/25/14  Yes Lorayne Marek, MD  traMADol (ULTRAM) 50 MG tablet Take 1 tablet (50 mg total) by mouth every 6 (six) hours as needed for moderate pain. 01/07/15  Yes Merryl Hacker, MD  aspirin EC 81 MG EC tablet Take 1 tablet (81 mg total) by mouth daily. 08/24/14   Kinnie Feil, MD  cephALEXin (KEFLEX) 500 MG capsule Take 1 capsule (500 mg total) by mouth 4 (four) times daily. 01/07/15   Merryl Hacker, MD  escitalopram (LEXAPRO) 20 MG tablet Take 1 tablet (20 mg total) by mouth daily. 08/24/14   Kinnie Feil, MD  feeding supplement, ENSURE COMPLETE, (ENSURE COMPLETE) LIQD Take 237 mLs by mouth 2 (two) times daily between meals. 10/09/14   Silver Huguenin Elgergawy, MD  hydrocortisone (ANUSOL-HC) 2.5 % rectal cream Apply topically 2 (two) times daily. 08/24/14   Kinnie Feil, MD  lactulose (CHRONULAC) 10 GM/15ML solution Take 30 mLs (20 g total) by mouth daily. 10/09/14   Silver Huguenin Elgergawy, MD  mirtazapine (REMERON SOL-TAB) 15 MG disintegrating tablet Take 1 tablet (15 mg total) by mouth at bedtime. 08/24/14   Kinnie Feil, MD   BP 98/57 mmHg  Pulse 56  Temp(Src) 97.7 F (36.5 C) (Oral)  Resp 22  Ht 6\' 1"  (1.854 m)  Wt 149 lb (67.586 kg)  BMI 19.66 kg/m2  SpO2 100% Physical Exam  Constitutional: He is oriented to person, place, and time. He appears well-developed and well-nourished.  HENT:  Head: Normocephalic and atraumatic.  Eyes: Conjunctivae are normal.  Neck: Normal range of motion. Neck supple.   Cardiovascular: Normal rate, regular rhythm and normal heart sounds.   Pulmonary/Chest: Effort normal and breath sounds normal. No accessory muscle usage. No respiratory distress. He has no decreased breath sounds. He has no wheezes. He has no rales. He exhibits no tenderness.  Abdominal: Soft. There is no tenderness.  Musculoskeletal: Normal range of motion.  7 sutures had been previously placed in the right great toe. No edema , erythema , ecchymosis surrounding the sutures. The wound appears to be healing well.  Neurological: He is alert and oriented to person, place, and time.  Skin: Skin is warm and dry.  Psychiatric: He has a normal mood and affect. His behavior is normal.  Nursing note and vitals reviewed.   ED  Course  SUTURE REMOVAL Date/Time: 01/24/2015 3:55 PM Performed by: Ottie Glazier Authorized by: Ottie Glazier Consent: Verbal consent obtained. Written consent obtained. Risks and benefits: risks, benefits and alternatives were discussed Consent given by: patient Patient understanding: patient states understanding of the procedure being performed Patient consent: the patient's understanding of the procedure matches consent given Patient identity confirmed: verbally with patient Body area: lower extremity Location details: right big toe Wound Appearance: clean Sutures Removed: 7 Facility: sutures placed in this facility Patient tolerance: Patient tolerated the procedure well with no immediate complications Comments: No dressing or ointment was applied.  Sutures were placed on 01/06/15 so it was well healed without surrounding erythema, edema, ecchymosis or drainage.    (including critical care time) Labs Review Labs Reviewed  CBC - Abnormal; Notable for the following:    RBC 3.17 (*)    Hemoglobin 10.0 (*)    HCT 28.8 (*)    RDW 16.3 (*)    Platelets 69 (*)    All other components within normal limits  COMPREHENSIVE METABOLIC PANEL - Abnormal; Notable for  the following:    Sodium 132 (*)    CO2 18 (*)    Glucose, Bld 140 (*)    BUN 24 (*)    Albumin 2.5 (*)    AST 46 (*)    Total Bilirubin 2.4 (*)    GFR calc non Af Amer 59 (*)    All other components within normal limits  TROPONIN I  BRAIN NATRIURETIC PEPTIDE  TROPONIN I    Imaging Review Dg Chest 2 View  01/24/2015   CLINICAL DATA:  Short of breath  EXAM: CHEST  2 VIEW  COMPARISON:  11/20/2014  FINDINGS: Prominent interstitial lung markings peripherally bilaterally most consistent with chronic lung disease and scarring. Decreased lung volume as noted previously.  Negative for superimposed pneumonia.  No edema or effusion.  IMPRESSION: Chronic lung disease with scarring. No superimposed acute abnormality.   Electronically Signed   By: Franchot Gallo M.D.   On: 01/24/2015 15:34     EKG Interpretation   Date/Time:  Thursday January 24 2015 13:44:48 EDT Ventricular Rate:  62 PR Interval:  158 QRS Duration: 90 QT Interval:  432 QTC Calculation: 438 R Axis:   39 Text Interpretation:  Normal sinus rhythm Cannot rule out Anterior infarct  , age undetermined Abnormal ECG No significant change since last tracing  Confirmed by KNAPP  MD-J, JON ([id number]) on 01/24/2015 2:07:02 PM      MDM   Final diagnoses:  Encounter for removal of sutures  Shortness of breath   He is 90% on 2L. Patient states he is short of breath at baseline but worse in the last 10 days.  He is in the process of getting home O2.  Patient has anemia at baseline and labs are not concerning at this time. Troponin is negative and EKG is not concerning. Chest xray is negative for pleural effusion, edema, pneumonia, or pneumothorax.  Medications  sodium chloride 0.9 % bolus 500 mL (not administered)  albuterol (PROVENTIL) (2.5 MG/3ML) 0.083% nebulizer solution 2.5 mg (2.5 mg Nebulization Given 01/24/15 1603)  I discussed this patient with and transferred care to Dr. Mingo Amber. Patient has pending CTA. Dispo depends on oxygen  requirement and CTA results.   I removed 7 sutures that were placed in the great toe on 01/06/2015.   Ottie Glazier, PA-C 01/24/15 1625  Garrett Rank, MD 01/24/15 [phone]

## 2015-01-24 NOTE — Telephone Encounter (Signed)
Garrett Turner called this morning stating that pt's behavior has been changing and is concern that fluid needs to be drained and is unsure if pt needs to be taken to the ER. Please follow up with [facility] asap.

## 2015-01-25 ENCOUNTER — Observation Stay (HOSPITAL_COMMUNITY): Payer: Medicare Other

## 2015-01-25 ENCOUNTER — Ambulatory Visit (HOSPITAL_COMMUNITY): Payer: Medicare Other

## 2015-01-25 ENCOUNTER — Encounter (HOSPITAL_COMMUNITY): Payer: Self-pay | Admitting: *Deleted

## 2015-01-25 ENCOUNTER — Telehealth: Payer: Self-pay | Admitting: Internal Medicine

## 2015-01-25 DIAGNOSIS — E119 Type 2 diabetes mellitus without complications: Secondary | ICD-10-CM | POA: Diagnosis not present

## 2015-01-25 DIAGNOSIS — K7030 Alcoholic cirrhosis of liver without ascites: Secondary | ICD-10-CM | POA: Diagnosis not present

## 2015-01-25 DIAGNOSIS — R0600 Dyspnea, unspecified: Secondary | ICD-10-CM | POA: Diagnosis not present

## 2015-01-25 DIAGNOSIS — J948 Other specified pleural conditions: Secondary | ICD-10-CM | POA: Diagnosis not present

## 2015-01-25 LAB — BODY FLUID CELL COUNT WITH DIFFERENTIAL
Eos, Fluid: 1 %
LYMPHS FL: 94 %
Monocyte-Macrophage-Serous Fluid: 4 % — ABNORMAL LOW (ref 50–90)
Neutrophil Count, Fluid: 1 % (ref 0–25)
Total Nucleated Cell Count, Fluid: 1478 cu mm — ABNORMAL HIGH (ref 0–1000)

## 2015-01-25 LAB — CBC
HCT: 25.9 % — ABNORMAL LOW (ref 39.0–52.0)
Hemoglobin: 9.1 g/dL — ABNORMAL LOW (ref 13.0–17.0)
MCH: 31.9 pg (ref 26.0–34.0)
MCHC: 35.1 g/dL (ref 30.0–36.0)
MCV: 90.9 fL (ref 78.0–100.0)
PLATELETS: 48 10*3/uL — AB (ref 150–400)
RBC: 2.85 MIL/uL — ABNORMAL LOW (ref 4.22–5.81)
RDW: 16.3 % — AB (ref 11.5–15.5)
WBC: 3.3 10*3/uL — ABNORMAL LOW (ref 4.0–10.5)

## 2015-01-25 LAB — BASIC METABOLIC PANEL
Anion gap: 6 (ref 5–15)
BUN: 22 mg/dL — ABNORMAL HIGH (ref 6–20)
CO2: 24 mmol/L (ref 22–32)
Calcium: 8.4 mg/dL — ABNORMAL LOW (ref 8.9–10.3)
Chloride: 102 mmol/L (ref 101–111)
Creatinine, Ser: 1.15 mg/dL (ref 0.61–1.24)
GFR calc Af Amer: >60 mL/min (ref >60)
GFR calc non Af Amer: >60 mL/min (ref >60)
Glucose, Bld: 117 mg/dL — ABNORMAL HIGH (ref 65–99)
POTASSIUM: 3.5 mmol/L (ref 3.5–5.1)
SODIUM: 132 mmol/L — AB (ref 135–145)

## 2015-01-25 LAB — GLUCOSE, CAPILLARY
GLUCOSE-CAPILLARY: 111 mg/dL — AB (ref 65–99)
Glucose-Capillary: 110 mg/dL — ABNORMAL HIGH (ref 65–99)
Glucose-Capillary: 182 mg/dL — ABNORMAL HIGH (ref 65–99)
Glucose-Capillary: 198 mg/dL — ABNORMAL HIGH (ref 65–99)

## 2015-01-25 LAB — PH, BODY FLUID: PH, FLUID: 8

## 2015-01-25 LAB — LACTATE DEHYDROGENASE, PLEURAL OR PERITONEAL FLUID: LD, Fluid: 179 U/L — ABNORMAL HIGH (ref 3–23)

## 2015-01-25 LAB — GRAM STAIN

## 2015-01-25 LAB — PROTEIN, BODY FLUID: Total protein, fluid: <3 g/dL

## 2015-01-25 LAB — TROPONIN I
Troponin I: <0.03 ng/mL (ref <0.031)
Troponin I: <0.03 ng/mL (ref <0.031)

## 2015-01-25 LAB — GLUCOSE, SEROUS FLUID: Glucose, Fluid: 178 mg/dL

## 2015-01-25 MED ORDER — SPIRONOLACTONE 25 MG PO TABS
25.0000 mg | ORAL_TABLET | Freq: Every day | ORAL | Status: DC
  Administered 2015-01-25: 25 mg via ORAL
  Filled 2015-01-25 (×2): qty 1

## 2015-01-25 MED ORDER — FUROSEMIDE 20 MG PO TABS
20.0000 mg | ORAL_TABLET | Freq: Every day | ORAL | Status: DC
  Administered 2015-01-25: 20 mg via ORAL
  Filled 2015-01-25 (×2): qty 1

## 2015-01-25 MED ORDER — ENSURE ENLIVE PO LIQD
237.0000 mL | Freq: Three times a day (TID) | ORAL | Status: DC
  Administered 2015-01-25 – 2015-01-30 (×8): 237 mL via ORAL

## 2015-01-25 MED ORDER — LIDOCAINE HCL (PF) 1 % IJ SOLN
INTRAMUSCULAR | Status: AC
  Filled 2015-01-25: qty 10

## 2015-01-25 MED ORDER — ENSURE ENLIVE PO LIQD
237.0000 mL | Freq: Two times a day (BID) | ORAL | Status: DC
  Administered 2015-01-25: 237 mL via ORAL

## 2015-01-25 NOTE — Progress Notes (Signed)
PT Cancellation Note  Patient Details Name: Garrett Turner MRN: [id number] DOB: 17-Sep-1950   Cancelled Treatment:    Reason Eval/Treat Not Completed: Patient not medically ready. Spoke with Lucille Passy, OT re: her recent session with pt. He quickly desaturated and was "wiped out" at the end of their session. Will defer evaluation until 7/9 with hopeful improvement in his medical status. (also may require O2 via face mask for activity)   Carlas Vandyne 01/25/2015, 3:38 PM  Pager [phone]

## 2015-01-25 NOTE — Progress Notes (Signed)
[facility] PROGRESS NOTE Assessment/Plan: Dyspnea/ Pleural effusion, left - BMP less than 103 16 showed an EF of 60%. - CT of the chest showed no PE or no pneumonia does show left-sided pleural effusion. - Thoracocentesis scheduled for this a.m. Labs for lites criteria currently ordered. Troponins have remained negative.  Protein-calorie malnutrition, severe - ensure TID    Normocytic Anemia - unclear will have to follow up with PCP as an outpatient.  Controlled Diabetes mellitus without complication: - Improved control.  NASH/Cirrhosis of liver/thrombocytopenia/hypoalbumenemia: - start aldactone decrease lasix. - monitor lytes.  Hyperlipidemia  Code Status: full Family Communication: none  Disposition Plan: inpatient home in 2-3 days  Consultants:  none  Procedures:  CT angio  Antibiotics:  None  HPI/Subjective: Still SOB  Objective: Filed Vitals:   01/24/15 2030 01/24/15 2127 01/25/15 0142 01/25/15 0652  BP: 89/56 100/63  100/60  Pulse: 58 58    Temp:  97.7 F (36.5 C)  98.5 F (36.9 C)  TempSrc:  Oral  Oral  Resp: 22 20 20 18   Height:      Weight:  66 kg (145 lb 8.1 oz)    SpO2: 97% 99% 95% 98%    Intake/Output Summary (Last 24 hours) at 01/25/15 0845 Last data filed at 01/25/15 0823  Gross per 24 hour  Intake      0 ml  Output    527 ml  Net   -527 ml   Filed Weights   01/24/15 1347 01/24/15 2127  Weight: 67.586 kg (149 lb) 66 kg (145 lb 8.1 oz)    Exam:  General: Alert, awake, oriented x3, in no acute distress.  HEENT: No bruits, no goiter.  Heart: Regular rate and rhythm. Lungs: Good air movement, clear Abdomen: Soft, nontender, nondistended, positive bowel sounds.  Neuro: Grossly intact, nonfocal.   Data Reviewed: Basic Metabolic Panel:  Recent Labs Lab 01/24/15 1352 01/25/15 0446  NA 132* 132*  K 3.9 3.5  CL 102 102  CO2 18* 24  GLUCOSE 140* 117*  BUN 24* 22*  CREATININE 1.24 1.15  CALCIUM 8.9 8.4*    Liver Function Tests:  Recent Labs Lab 01/24/15 1352  AST 46*  ALT 26  ALKPHOS 88  BILITOT 2.4*  PROT 7.5  ALBUMIN 2.5*   No results for input(s): LIPASE, AMYLASE in the last 168 hours. No results for input(s): AMMONIA in the last 168 hours. CBC:  Recent Labs Lab 01/24/15 1352 01/25/15 0446  WBC 4.6 3.3*  HGB 10.0* 9.1*  HCT 28.8* 25.9*  MCV 90.9 90.9  PLT 69* 48*   Cardiac Enzymes:  Recent Labs Lab 01/24/15 1148 01/24/15 1352 01/24/15 1620 01/25/15 0446  TROPONINI <0.03 <0.03 <0.03 <0.03   BNP (last 3 results)  Recent Labs  10/04/14 1125 11/19/14 1310 01/24/15 1352  BNP 65.8 77.4 56.2    ProBNP (last 3 results) No results for input(s): PROBNP in the last 8760 hours.  CBG:  Recent Labs Lab 01/24/15 2131 01/25/15 0645  GLUCAP 181* 110*    No results found for this or any previous visit (from the past 240 hour(s)).   Studies: Dg Chest 2 View  01/24/2015   CLINICAL DATA:  Short of breath  EXAM: CHEST  2 VIEW  COMPARISON:  11/20/2014  FINDINGS: Prominent interstitial lung markings peripherally bilaterally most consistent with chronic lung disease and scarring. Decreased lung volume as noted previously.  Negative for superimposed pneumonia.  No edema or effusion.  IMPRESSION: Chronic lung disease with scarring.  No superimposed acute abnormality.   Electronically Signed   By: Franchot Gallo M.D.   On: 01/24/2015 15:34   Ct Angio Chest Pe W/cm &/or Wo Cm  01/24/2015   CLINICAL DATA:  Shortness of breath.  Weakness.  EXAM: CT ANGIOGRAPHY CHEST WITH CONTRAST  TECHNIQUE: Multidetector CT imaging of the chest was performed using the standard protocol during bolus administration of intravenous contrast. Multiplanar CT image reconstructions and MIPs were obtained to evaluate the vascular anatomy.  CONTRAST:  125mL OMNIPAQUE IOHEXOL 350 MG/ML SOLN  COMPARISON:  Chest radiographs obtained earlier today. Chest CTA dated 11/19/2014.  FINDINGS: Normally opacified  pulmonary arteries with no pulmonary arterial filling defects seen. No significant change in prominence of the interstitial markings in the periphery of both lungs. Small to moderate-sized left pleural effusion with an interval decrease in size.  Moderate amount of free peritoneal fluid in the upper abdomen. The liver contours are mildly lobulated with a small right lobe and enlarged lateral segment left lobe and caudate lobe. The spleen is also diffusely enlarged. Upper abdominal varices are also demonstrated. Thoracic spine degenerative changes.  Review of the MIP images confirms the above findings.  IMPRESSION: 1. No pulmonary emboli a or acute abnormality. 2. Small to moderate-sized left pleural effusion, decreased. 3. Chronic peripheral interstitial lung disease. 4. Changes of cirrhosis of the liver with portal venous hypertension, varices and ascites.   Electronically Signed   By: Claudie Revering M.D.   On: 01/24/2015 17:40    Scheduled Meds: . atorvastatin  20 mg Oral Daily  . furosemide  40 mg Oral Daily  . insulin aspart  0-5 Units Subcutaneous QHS  . insulin aspart  0-9 Units Subcutaneous TID WC  . senna  1 tablet Oral Daily  . sodium chloride  3 mL Intravenous Q12H   Continuous Infusions:   Time Spent: 25 min   Charlynne Cousins  [facility] Pager [phone]. If 7PM-7AM, please contact night-coverage at [email or web address], password [facility] 01/25/2015, 8:45 AM

## 2015-01-25 NOTE — Progress Notes (Signed)
Occupational Therapy Evaluation Patient Details Name: Garrett Turner MRN: [id number] DOB: [birth year] Today's Date: 01/25/2015    History of Present Illness Patient is a 64 year old male with hypertension, hyperlipidemia, anxiety, type 2 diabetes mellitus, liver cirrhosis due to NASH presented to ED with shortness of breath, worsening over the last 10 days. History was obtained from the patient who reported that he has chronic shortness of breath however in the last 10 days it has been getting progressively worse. He states that he gets short of breath from walking from his bedroom to the bathroom or getting the food from the kitchen. He lives at home alone and uses cane or walker when ambulating. Patient says he has off and on cough with clear sputum. He gets chest tightness when he gets very short of breath on ambulation. He denies any fevers or chills. He has been sleeping on his couch for the last 10 days as it is closer to the bathroom and the kitchen.  Pt found to have pulmonary effusion and underwent thoracentesis 01/25/15   Clinical Impression   Pt admitted with above. He demonstrates the below listed deficits and will benefit from continued OT to maximize safety and independence with BADLs.  Pt limited by 02 desaturation with activity.  Pt was 94% on 2L 02 supine.   Upon standing, sats decreased to 81%.  02 titrated up to 6L with sats increasing to 84-86%.  He was returned to supine with sats returning to 97% and able to titrate 02 back down to 2L.  He reports he has been doing only minimal activity at home due to fatigue and lives alone.  Recommend SNF at discharge, although, he reports he is unable to afford the copay and may refuse.  Will follow acutely.       Follow Up Recommendations  SNF;Supervision/Assistance - 24 hour    Equipment Recommendations  3 in 1 bedside comode    Recommendations for Other Services       Precautions / Restrictions Precautions Precautions: Fall       Mobility Bed Mobility Overal bed mobility: Needs Assistance Bed Mobility: Supine to Sit;Sit to Supine     Supine to sit: Min guard Sit to supine: Min guard      Transfers Overall transfer level: Needs assistance   Transfers: Sit to/from [employer] Transfers Sit to Stand: Min guard Stand pivot transfers: Min guard       General transfer comment: mildly unsteady     Balance Overall balance assessment: Needs assistance Sitting-balance support: Feet supported Sitting balance-Leahy Scale: Good     Standing balance support: Bilateral upper extremity supported Standing balance-Leahy Scale: Poor Standing balance comment: reliant on UE support                             ADL Overall ADL's : Needs assistance/impaired Eating/Feeding: Independent;Bed level   Grooming: Wash/dry hands;Wash/dry face;Oral care;Brushing hair;Sitting;Supervision/safety;Set up   Upper Body Bathing: Set up;Sitting   Lower Body Bathing: Minimal assistance;Sit to/from stand   Upper Body Dressing : Minimal assistance;Sitting   Lower Body Dressing: Minimal assistance;Sit to/from stand   Toilet Transfer: Minimal assistance;Stand-pivot;BSC;RW   Toileting- Clothing Manipulation and Hygiene: Minimal assistance;Sit to/from stand       Functional mobility during ADLs: Minimal assistance;Rolling walker;Min guard General ADL Comments: Pt limited by decreaesd 02 sats with minimal activity      Vision     Perception     Praxis  Pertinent Vitals/Pain Pain Assessment: Faces Faces Pain Scale: Hurts little more Pain Location: chest with activity.  Pt reports he often has chest tightness      Hand Dominance Right   Extremity/Trunk Assessment Upper Extremity Assessment Upper Extremity Assessment: Generalized weakness   Lower Extremity Assessment Lower Extremity Assessment: Defer to PT evaluation   Cervical / Trunk Assessment Cervical / Trunk Assessment: Normal    Communication Communication Communication: No difficulties   Cognition Arousal/Alertness: Awake/alert Behavior During Therapy: WFL for tasks assessed/performed Overall Cognitive Status: Within Functional Limits for tasks assessed                     General Comments       Exercises       Shoulder Instructions      Home Living Family/patient expects to be discharged to:: Private residence   Available Help at Discharge: Family;Available PRN/intermittently Type of Home: House Home Access: Level entry     Home Layout: One level     Bathroom Shower/Tub: Occupational psychologist: Handicapped height Bathroom Accessibility: Yes   Home Equipment: Walker - 2 wheels;Grab bars - toilet;Grab bars - tub/shower;Hand held shower head;Shower seat   Additional Comments: Pt reports he spent 20 days in SNF level rehab in Jan. 2016, and can not afford the copayment to return       Prior Functioning/Environment Level of Independence: Independent with assistive device(s)        Comments: Pt reports he ambulates with RW or SPC in his home.  Daughter now does grocery shopping for him.  He prepares meals in microwave, and mostly lies on the couch due to decreased energy.     OT Diagnosis: Generalized weakness   OT Problem List: Decreased strength;Decreased activity tolerance;Impaired balance (sitting and/or standing);Cardiopulmonary status limiting activity   OT Treatment/Interventions: Self-care/ADL training;Therapeutic exercise;DME and/or AE instruction;Energy conservation;Therapeutic activities;Patient/family education;Balance training    OT Goals(Current goals can be found in the care plan section) Acute Rehab OT Goals Patient Stated Goal: to get better  OT Goal Formulation: With patient Time For Goal Achievement: 02/08/15 Potential to Achieve Goals: Good ADL Goals Pt Will Perform Grooming: with supervision;standing Pt Will Perform Upper Body Bathing: with  set-up;sitting Pt Will Perform Lower Body Bathing: with supervision;sit to/from stand Pt Will Perform Upper Body Dressing: with set-up;sitting Pt Will Perform Lower Body Dressing: with supervision;sit to/from stand Pt Will Transfer to Toilet: with supervision;ambulating;regular height toilet;bedside commode;grab bars Pt Will Perform Toileting - Clothing Manipulation and hygiene: with supervision;sit to/from stand Pt Will Perform Tub/Shower Transfer: Shower transfer;with supervision;ambulating;shower seat;grab bars Additional ADL Goal #1: Pt will be independent with energy conservation techniques   OT Frequency: Min 2X/week   Barriers to D/C: Decreased caregiver support          Co-evaluation              End of Session Equipment Utilized During Treatment: Rolling walker Nurse Communication: Mobility status;Other (comment) (02 needs)  Activity Tolerance: Treatment limited secondary to medical complications (Comment) Patient left: in bed;with call bell/phone within reach   Time: 1333-1411 OT Time Calculation (min): 38 min Charges:  OT General Charges $OT Visit: 1 Procedure OT Evaluation $Initial OT Evaluation Tier I: 1 Procedure OT Treatments $Therapeutic Activity: 23-37 mins G-Codes: OT G-codes **NOT FOR INPATIENT CLASS** Functional Limitation: Self care Self Care Current Status (C9470): At least 20 percent but less than 40 percent impaired, limited or restricted Self Care Goal Status (J6283): At least  20 percent but less than 40 percent impaired, limited or restricted  Elius Etheredge M 01/25/2015, 3:23 PM

## 2015-01-25 NOTE — Telephone Encounter (Signed)
Patient called to check on the status of an order that [place] faxed [place] for an oxygen machine at home, he stated that he is currently in the hospital and is not sure when he will be discharging. Please f/u with pt at  2W19, [phone] if he is still admitted. Please f/u

## 2015-01-25 NOTE — Procedures (Signed)
   L US guided thora 180 cc bloody fluid  Tolerated well  cxr portable pending

## 2015-01-25 NOTE — Progress Notes (Signed)
PT Cancellation Note  Patient Details Name: Vermon Grays MRN: [id number] DOB: 03-12-1951   Cancelled Treatment:    Reason Eval/Treat Not Completed: Patient not medically ready. Noted pt on bedrest until 11:17 AM and plans for thoracentesis. Will proceed with PT eval when medically appropriate.   Kase Shughart 01/25/2015, 8:29 AM  Pager [phone]

## 2015-01-25 NOTE — Progress Notes (Signed)
Initial Nutrition Assessment  DOCUMENTATION CODES:  Severe malnutrition in context of chronic illness  INTERVENTION:   Ensure Enlive po TID, each supplement provides 350 kcal and 20 grams of protein  NUTRITION DIAGNOSIS:  Increased nutrient needs related to chronic illness as evidenced by estimated needs  GOAL:  Patient will meet greater than or equal to 90% of their needs  MONITOR:  PO intake, Supplement acceptance, Labs, Weight trends, I & O's  REASON FOR ASSESSMENT:  Consult Poor PO  ASSESSMENT: 64 year old Male with hypertension, hyperlipidemia, anxiety, type 2 diabetes mellitus, liver cirrhosis due to NASH presented to ED with shortness of breath, worsening over the last 10 days. History was obtained from the patient who reported that he has chronic shortness of breath however in the last 10 days it has been getting progressively worse.  Patient known to this RD.  Malnutrition identified during previous admission and is ongoing.  Pt reports a decreased appetite.  Lives alone and has difficulty cooking for himself due to weakness.  Typically consumes convenience type foods (ie soups, frozen dinners).  Pt also states he's been approved for Meals on Wheels but has not been receiving the meals yet -- spoke with a representative over the phone who told him he's on the "waiting list."  Drinks Premier Protein shakes at home.  Has Ensure Enlive supplement currently ordered BID.  Nutrition-Focused physical exam completed. Findings are severe fat depletion, severe muscle depletion, and no edema.   Height:  Ht Readings from Last 1 Encounters:  01/24/15 6\' 1"  (1.854 m)    Weight:  Wt Readings from Last 1 Encounters:  01/24/15 145 lb 8.1 oz (66 kg)    Ideal Body Weight:  83.6 kg  Wt Readings from Last 10 Encounters:  01/24/15 145 lb 8.1 oz (66 kg)  01/06/15 156 lb (70.761 kg)  12/25/14 167 lb (75.751 kg)  11/19/14 160 lb 7.9 oz (72.8 kg)  11/09/14 168 lb 9.6 oz (76.476  kg)  10/22/14 178 lb 3.2 oz (80.831 kg)  10/09/14 170 lb 6.4 oz (77.293 kg)  10/04/14 161 lb 12.8 oz (73.392 kg)  09/12/14 164 lb (74.39 kg)  08/30/14 186 lb (84.369 kg)    BMI:  Body mass index is 19.2 kg/(m^2).  Estimated Nutritional Needs:  Kcal:  2000-2200  Protein:  100-110 gm  Fluid:  2.0-2.2 L  Skin:  Reviewed, no issues  Diet Order:  Diet Heart Room service appropriate?: Yes; Fluid consistency:: Thin  EDUCATION NEEDS:  No education needs identified at this time   Intake/Output Summary (Last 24 hours) at 01/25/15 1255 Last data filed at 01/25/15 0823  Gross per 24 hour  Intake      0 ml  Output    527 ml  Net   -527 ml    Last BM: 7/6  Arthur Holms, RD, LDN Pager #: [phone] After-Hours Pager #: [phone]

## 2015-01-25 NOTE — Progress Notes (Signed)
Echocardiogram 2D Echocardiogram has been performed.  Joelene Millin 01/25/2015, 9:34 AM

## 2015-01-25 NOTE — Progress Notes (Signed)
SATURATION QUALIFICATIONS: (This note is used to comply with regulatory documentation for home oxygen)  Patient Saturations on Room Air at Rest = 98%  Patient Saturations on Room Air while Ambulating = 81%  Patient Saturations on 2 Liters of oxygen while Ambulating = 92%  Please briefly explain why patient needs home oxygen:Pt's sats drop when ambulating.  Pt dropped to 81% after 10 feet of walking.

## 2015-01-25 NOTE — Care Management Note (Addendum)
Case Management Note  Patient Details  Name: Garrett Turner MRN: [id number] Date of Birth: Nov 30, 1950  Subjective/Objective:   Pt admitted with dyspnea                 Action/Plan:  Pt is already active with [facility] for HHPT and RN.  Pt is from home with wife.  CM will monitor pt for disposition plan   Expected Discharge Date:                  Expected Discharge Plan:  [place]  In-House Referral:     Discharge planning Services  CM Consult  Post Acute Care Choice:    Choice offered to:  Patient  DME Arranged:  Oxygen DME Agency:  [facility] Arranged:  RN, PT [place] Agency:  [facility] (Pt already active with [facility] and PT prior to admit)  Status of Service:  In process, will continue to follow  Medicare Important Message Given:    Date Medicare IM Given:    Medicare IM give by:    Date Additional Medicare IM Given:    Additional Medicare Important Message give by:     If discussed at [place] of Stay Meetings, dates discussed:    Additional Comments: CM confirmed with [facility] that oxygen will be supplied for pt prior and post discharge.  CM assessed pt; pt experiencing SOB during conversation.  Pt stated he has been waiting on oxygen for home for at least a month, CM unable to find previous home 02 order.  Pt experienced desat 85% in an attempt to ambulate in ED.  MD contacted, home oxygen order written.  [facility] contacted, referral accepted.  Bedside nurse contacted, CM requested pulmonary ambulation documentation.  CM requested resumption order for [facility] to resume HHRN and PT, [facility] made aware of admit.  Maryclare Labrador, RN 01/25/2015, 12:38 PM

## 2015-01-26 DIAGNOSIS — K7031 Alcoholic cirrhosis of liver with ascites: Secondary | ICD-10-CM | POA: Diagnosis not present

## 2015-01-26 DIAGNOSIS — R0600 Dyspnea, unspecified: Secondary | ICD-10-CM | POA: Diagnosis not present

## 2015-01-26 DIAGNOSIS — J948 Other specified pleural conditions: Secondary | ICD-10-CM | POA: Diagnosis not present

## 2015-01-26 LAB — URINE CULTURE

## 2015-01-26 LAB — BASIC METABOLIC PANEL
ANION GAP: 6 (ref 5–15)
BUN: 15 mg/dL (ref 6–20)
CALCIUM: 8.6 mg/dL — AB (ref 8.9–10.3)
CO2: 23 mmol/L (ref 22–32)
Chloride: 102 mmol/L (ref 101–111)
Creatinine, Ser: 0.96 mg/dL (ref 0.61–1.24)
GFR calc Af Amer: >60 mL/min (ref >60)
GLUCOSE: 258 mg/dL — AB (ref 65–99)
POTASSIUM: 4 mmol/L (ref 3.5–5.1)
Sodium: 131 mmol/L — ABNORMAL LOW (ref 135–145)

## 2015-01-26 LAB — GLUCOSE, CAPILLARY
GLUCOSE-CAPILLARY: 186 mg/dL — AB (ref 65–99)
Glucose-Capillary: 134 mg/dL — ABNORMAL HIGH (ref 65–99)
Glucose-Capillary: 266 mg/dL — ABNORMAL HIGH (ref 65–99)
Glucose-Capillary: 208 mg/dL — ABNORMAL HIGH (ref 65–99)

## 2015-01-26 LAB — HEMOGLOBIN A1C
Hgb A1c MFr Bld: 6.3 % — ABNORMAL HIGH (ref 4.8–5.6)
MEAN PLASMA GLUCOSE: 134 mg/dL

## 2015-01-26 MED ORDER — SPIRONOLACTONE 100 MG PO TABS
100.0000 mg | ORAL_TABLET | Freq: Every day | ORAL | Status: DC
  Administered 2015-01-26: 100 mg via ORAL
  Filled 2015-01-26 (×2): qty 1

## 2015-01-26 MED ORDER — SPIRONOLACTONE 50 MG PO TABS: 50.0000 mg | ORAL_TABLET | Freq: Every day | ORAL | Status: DC

## 2015-01-26 MED ORDER — FUROSEMIDE 40 MG PO TABS
40.0000 mg | ORAL_TABLET | Freq: Every day | ORAL | Status: DC
  Administered 2015-01-26: 40 mg via ORAL
  Filled 2015-01-26 (×2): qty 1

## 2015-01-26 NOTE — Progress Notes (Addendum)
[facility] PROGRESS NOTE Assessment/Plan: Dyspnea/ Pleural effusion, left - BNP 56 Echo showed an EF of 60%. Likely from liver cirrhosis. Cont to increase aldactone and lasix. - CT of the chest showed no PE or no pneumonia does show left-sided pleural effusion. - Labs for lites criteria pending. - Troponins have remained negative.  Protein-calorie malnutrition, severe - Ensure TID    Normocytic Anemia - unclear will have to follow up with PCP as an outpatient.  Controlled Diabetes mellitus without complication: - Improved control.  NASH/Cirrhosis of liver/thrombocytopenia/hypoalbumenemia: - increase aldactone and lasix. - monitor lytes.  Hyperlipidemia  Code Status: full Family Communication: none  Disposition Plan: inpatient home in 2-3 days  Consultants:  none  Procedures:  CT angio  Antibiotics:  None  HPI/Subjective: SOB unchanged.  Objective: Filed Vitals:   01/25/15 1045 01/25/15 1341 01/25/15 2127 01/26/15 0617  BP: 98/58 98/58 96/60  94/58  Pulse:  68 67 71  Temp:   98.3 F (36.8 C) 98.1 F (36.7 C)  TempSrc:  Oral Oral Oral  Resp:  20 16 16   Height:      Weight:      SpO2: 99% 98% 98% 98%    Intake/Output Summary (Last 24 hours) at 01/26/15 0939 Last data filed at 01/26/15 0700  Gross per 24 hour  Intake    840 ml  Output    950 ml  Net   -110 ml   Filed Weights   01/24/15 1347 01/24/15 2127  Weight: 67.586 kg (149 lb) 66 kg (145 lb 8.1 oz)    Exam:  General: Alert, awake, oriented x3, in no acute distress.  HEENT: No bruits, no goiter.  Heart: Regular rate and rhythm. Lungs: Good air movement, clear Abdomen: Soft, nontender, nondistended, positive bowel sounds.  Neuro: Grossly intact, nonfocal.   Data Reviewed: Basic Metabolic Panel:  Recent Labs Lab 01/24/15 1352 01/25/15 0446  NA 132* 132*  K 3.9 3.5  CL 102 102  CO2 18* 24  GLUCOSE 140* 117*  BUN 24* 22*  CREATININE 1.24 1.15  CALCIUM 8.9 8.4*    Liver Function Tests:  Recent Labs Lab 01/24/15 1352  AST 46*  ALT 26  ALKPHOS 88  BILITOT 2.4*  PROT 7.5  ALBUMIN 2.5*   No results for input(s): LIPASE, AMYLASE in the last 168 hours. No results for input(s): AMMONIA in the last 168 hours. CBC:  Recent Labs Lab 01/24/15 1352 01/25/15 0446  WBC 4.6 3.3*  HGB 10.0* 9.1*  HCT 28.8* 25.9*  MCV 90.9 90.9  PLT 69* 48*   Cardiac Enzymes:  Recent Labs Lab 01/24/15 1148 01/24/15 1352 01/24/15 1620 01/25/15 0446 01/25/15 1105  TROPONINI <0.03 <0.03 <0.03 <0.03 <0.03   BNP (last 3 results)  Recent Labs  10/04/14 1125 11/19/14 1310 01/24/15 1352  BNP 65.8 77.4 56.2    ProBNP (last 3 results) No results for input(s): PROBNP in the last 8760 hours.  CBG:  Recent Labs Lab 01/25/15 0645 01/25/15 1115 01/25/15 1613 01/25/15 2147 01/26/15 0623  GLUCAP 110* 182* 198* 111* 134*    Recent Results (from the past 240 hour(s))  Gram stain     Status: None   Collection Time: 01/25/15 10:23 AM  Result Value Ref Range Status   Specimen Description FLUID LEFT PLEURAL  Final   Special Requests NONE  Final   Gram Stain   Final    ABUNDANT WBC PRESENT, PREDOMINANTLY MONONUCLEAR NO ORGANISMS SEEN    Report Status 01/25/2015 FINAL  Final  Studies: Dg Chest 2 View  01/24/2015   CLINICAL DATA:  Short of breath  EXAM: CHEST  2 VIEW  COMPARISON:  11/20/2014  FINDINGS: Prominent interstitial lung markings peripherally bilaterally most consistent with chronic lung disease and scarring. Decreased lung volume as noted previously.  Negative for superimposed pneumonia.  No edema or effusion.  IMPRESSION: Chronic lung disease with scarring. No superimposed acute abnormality.   Electronically Signed   By: Franchot Gallo M.D.   On: 01/24/2015 15:34   Ct Angio Chest Pe W/cm &/or Wo Cm  01/24/2015   CLINICAL DATA:  Shortness of breath.  Weakness.  EXAM: CT ANGIOGRAPHY CHEST WITH CONTRAST  TECHNIQUE: Multidetector CT imaging of  the chest was performed using the standard protocol during bolus administration of intravenous contrast. Multiplanar CT image reconstructions and MIPs were obtained to evaluate the vascular anatomy.  CONTRAST:  115mL OMNIPAQUE IOHEXOL 350 MG/ML SOLN  COMPARISON:  Chest radiographs obtained earlier today. Chest CTA dated 11/19/2014.  FINDINGS: Normally opacified pulmonary arteries with no pulmonary arterial filling defects seen. No significant change in prominence of the interstitial markings in the periphery of both lungs. Small to moderate-sized left pleural effusion with an interval decrease in size.  Moderate amount of free peritoneal fluid in the upper abdomen. The liver contours are mildly lobulated with a small right lobe and enlarged lateral segment left lobe and caudate lobe. The spleen is also diffusely enlarged. Upper abdominal varices are also demonstrated. Thoracic spine degenerative changes.  Review of the MIP images confirms the above findings.  IMPRESSION: 1. No pulmonary emboli a or acute abnormality. 2. Small to moderate-sized left pleural effusion, decreased. 3. Chronic peripheral interstitial lung disease. 4. Changes of cirrhosis of the liver with portal venous hypertension, varices and ascites.   Electronically Signed   By: Claudie Revering M.D.   On: 01/24/2015 17:40   Dg Chest Port 1 View  01/25/2015   CLINICAL DATA:  Status post left thoracentesis for pleural effusion.  EXAM: PORTABLE CHEST - 1 VIEW  COMPARISON:  CT chest and PA and lateral chest 01/24/2015.  FINDINGS: No pneumothorax is identified after thoracentesis. No pleural effusion is seen. Prominent pulmonary interstitium is unchanged. Heart size is normal.  IMPRESSION: Negative for pneumothorax after thoracentesis.  No new abnormality.   Electronically Signed   By: Inge Rise M.D.   On: 01/25/2015 11:24   US Thoracentesis Asp Pleural Space W/img Guide  01/25/2015   INDICATION: Symptomatic L sided pleural effusion; small effusion   EXAM: US THORACENTESIS ASP PLEURAL SPACE W/IMG GUIDE  COMPARISON:  None.  MEDICATIONS: 5 cc 1% lidocaine  COMPLICATIONS: None immediate  TECHNIQUE: Informed written consent was obtained from the patient after a discussion of the risks, benefits and alternatives to treatment. A timeout was performed prior to the initiation of the procedure.  Initial ultrasound scanning demonstrates a small left pleural effusion. The lower chest was prepped and draped in the usual sterile fashion. 1% lidocaine was used for local anesthesia.  Under direct ultrasound guidance, a 19 gauge, 7-cm, Yueh catheter was introduced. An ultrasound image was saved for documentation purposes. The thoracentesis was performed. The catheter was removed and a dressing was applied. The patient tolerated the procedure well without immediate post procedural complication. The patient was escorted to have an upright chest radiograph.  FINDINGS: A total of approximately 180 cc of bloody fluid was removed. Requested samples were sent to the laboratory.  IMPRESSION: Successful ultrasound-guided L sided thoracentesis yielding 180 cc of pleural  fluid.  Read by:  Lavonia Drafts [facility]   Electronically Signed   By: Marybelle Killings M.D.   On: 01/25/2015 10:26    Scheduled Meds: . atorvastatin  20 mg Oral Daily  . feeding supplement (ENSURE ENLIVE)  237 mL Oral TID BM  . furosemide  20 mg Oral Daily  . insulin aspart  0-5 Units Subcutaneous QHS  . insulin aspart  0-9 Units Subcutaneous TID WC  . senna  1 tablet Oral Daily  . sodium chloride  3 mL Intravenous Q12H  . spironolactone  25 mg Oral Daily   Continuous Infusions:   Time Spent: 25 min   Charlynne Cousins  [facility] Pager [phone]. If 7PM-7AM, please contact night-coverage at [email or web address], password [facility] 01/26/2015, 9:39 AM

## 2015-01-26 NOTE — Evaluation (Signed)
Physical Therapy Evaluation Patient Details Name: Garrett Turner MRN: [id number] DOB: 1950-10-14 Today's Date: 01/26/2015   History of Present Illness  Patient is a 64 year old male with hypertension, hyperlipidemia, anxiety, type 2 diabetes mellitus, liver cirrhosis due to NASH presented to ED with shortness of breath, worsening over the last 10 days. History was obtained from the patient who reported that he has chronic shortness of breath however in the last 10 days it has been getting progressively worse. He states that he gets short of breath from walking from his bedroom to the bathroom or getting the food from the kitchen. He lives at home alone and uses cane or walker when ambulating. Patient says he has off and on cough with clear sputum. He gets chest tightness when he gets very short of breath on ambulation. He denies any fevers or chills. He has been sleeping on his couch for the last 10 days as it is closer to the bathroom and the kitchen.  Pt found to have pulmonary effusion and underwent thoracentesis 01/25/15  Clinical Impression  Patient presents with problems listed below.  Will benefit from acute PT to maximize functional mobility prior to discharge.  Patient limited in mobility today due to decreasing O2 sats with mobility - to 87% with ambulation on 2 liters O2.  Patient's activity is significantly limited by dyspnea.  Recommend ST-SNF at discharge for continued therapy with goal to reach Mod I level and return home.    Follow Up Recommendations SNF;Supervision/Assistance - 24 hour    Equipment Recommendations  None recommended by PT    Recommendations for Other Services       Precautions / Restrictions Precautions Precautions: Fall Precaution Comments: DOE; decreased O2 sats with mobility Restrictions Weight Bearing Restrictions: No      Mobility  Bed Mobility Overal bed mobility: Needs Assistance Bed Mobility: Supine to Sit;Sit to Supine     Supine to sit: Min  guard Sit to supine: Min guard   General bed mobility comments: Min guard for safety during transitions.  Transfers Overall transfer level: Needs assistance Equipment used: Rolling walker (2 wheeled) Transfers: Sit to/from Stand Sit to Stand: Min guard         General transfer comment: Uses safe technique.  Assist for safety.  Slightly unsteady.  Ambulation/Gait Ambulation/Gait assistance: Min assist Ambulation Distance (Feet): 48 Feet (24' x 2 with sitting rest break) Assistive device: Rolling walker (2 wheeled) Gait Pattern/deviations: Step-through pattern;Decreased step length - right;Decreased step length - left;Decreased stride length;Shuffle;Trunk flexed Gait velocity: Decreased Gait velocity interpretation: Below normal speed for age/gender General Gait Details: Cues to stand upright and stay close to RW for safety.  Patient ambulated 49' with RW and O2 at 2 L/min.  O2 sat dropped to 87%.  Sat EOB and O2 sat slow to increase.  Increased O2 to 4 l/min and sats increased to 92% in 90 seconds.  Patient then ambulated 24' with O2 at 4 l/min and O2 sats remained at 92%.  Sat EOB and then O2 sats dropped to 88%.  Cuing to breathe through nose.  O2 sats increased to 93%.  Returned to supine and O2 sats increased to 96%.  Returned patient to O2 at 2 l/min.  Stairs            Wheelchair Mobility    Modified Rankin (Stroke Patients Only)       Balance           Standing balance support: Bilateral upper extremity supported Standing  balance-Leahy Scale: Poor                               Pertinent Vitals/Pain Pain Assessment: No/denies pain (Reports "tightness" in chest with dyspnea)    Home Living Family/patient expects to be discharged to:: Private residence Living Arrangements: Alone Available Help at Discharge: Family;Available PRN/intermittently Type of Home: House Home Access: Level entry     Home Layout: One level Home Equipment: Walker - 2  wheels;Grab bars - toilet;Grab bars - tub/shower;Hand held shower head;Shower seat      Prior Function Level of Independence: Independent with assistive device(s)         Comments: Pt reports he ambulates with RW or SPC in his home.  Daughter now does grocery shopping for him.  He prepares meals in microwave, and mostly lies on the couch due to decreased energy.      Hand Dominance   Dominant Hand: Right    Extremity/Trunk Assessment   Upper Extremity Assessment: Generalized weakness           Lower Extremity Assessment: Generalized weakness         Communication   Communication: No difficulties  Cognition Arousal/Alertness: Awake/alert Behavior During Therapy: WFL for tasks assessed/performed Overall Cognitive Status: Within Functional Limits for tasks assessed                      General Comments General comments (skin integrity, edema, etc.): O2 sats decreased to 87% with ambulation on 2 l/min.  Increased O2 to 4 l/min and sats remained 92% with gait.  In sitting, O2 sats continued to decrease to 88% on 4 l/min.  Returned to 96% in supine with HOB elevated.    Exercises        Assessment/Plan    PT Assessment Patient needs continued PT services  PT Diagnosis Difficulty walking;Generalized weakness   PT Problem List Decreased strength;Decreased activity tolerance;Decreased balance;Decreased mobility;Cardiopulmonary status limiting activity  PT Treatment Interventions DME instruction;Gait training;Functional mobility training;Therapeutic activities;Therapeutic exercise;Patient/family education   PT Goals (Current goals can be found in the Care Plan section) Acute Rehab PT Goals Patient Stated Goal: to get better  PT Goal Formulation: With patient Time For Goal Achievement: 02/02/15 Potential to Achieve Goals: Fair    Frequency Min 3X/week   Barriers to discharge Decreased caregiver support Patient lives alone    Co-evaluation                End of Session Equipment Utilized During Treatment: Gait belt;Oxygen Activity Tolerance: Patient limited by fatigue;Treatment limited secondary to medical complications (Comment) (Decreased O2 sats) Patient left: in bed;with call bell/phone within reach;with bed alarm set Nurse Communication: Mobility status (O2 sat changes)    Functional Assessment Tool Used: Clinical judgement Functional Limitation: Mobility: Walking and moving around Mobility: Walking and Moving Around Current Status (V8938): At least 20 percent but less than 40 percent impaired, limited or restricted Mobility: Walking and Moving Around Goal Status [phone]): At least 1 percent but less than 20 percent impaired, limited or restricted    Time: 1138-1211 PT Time Calculation (min) (ACUTE ONLY): 33 min   Charges:   PT Evaluation $Initial PT Evaluation Tier I: 1 Procedure PT Treatments $Gait Training: 8-22 mins   PT G Codes:   PT G-Codes **NOT FOR INPATIENT CLASS** Functional Assessment Tool Used: Clinical judgement Functional Limitation: Mobility: Walking and moving around Mobility: Walking and Moving Around Current Status (  G8978): At least 20 percent but less than 40 percent impaired, limited or restricted Mobility: Walking and Moving Around Goal Status [phone]): At least 1 percent but less than 20 percent impaired, limited or restricted    Despina Pole 01/26/2015, 5:36 PM Carita Pian. Sanjuana Kava, [place] Pager [phone]

## 2015-01-26 NOTE — Progress Notes (Signed)
Patient complained of slight chest pressure that lasted for 5 minutes. RN assessed, patient states he is not having chest pain or anymore pressure. EKG completed, NSR. Patient resting comfortably, continue to monitor.   Sharene Skeans, RN

## 2015-01-27 DIAGNOSIS — Z66 Do not resuscitate: Secondary | ICD-10-CM | POA: Diagnosis not present

## 2015-01-27 DIAGNOSIS — Z8249 Family history of ischemic heart disease and other diseases of the circulatory system: Secondary | ICD-10-CM | POA: Diagnosis not present

## 2015-01-27 DIAGNOSIS — K7031 Alcoholic cirrhosis of liver with ascites: Secondary | ICD-10-CM | POA: Diagnosis not present

## 2015-01-27 DIAGNOSIS — F329 Major depressive disorder, single episode, unspecified: Secondary | ICD-10-CM | POA: Diagnosis present

## 2015-01-27 DIAGNOSIS — Z515 Encounter for palliative care: Secondary | ICD-10-CM | POA: Diagnosis not present

## 2015-01-27 DIAGNOSIS — R627 Adult failure to thrive: Secondary | ICD-10-CM | POA: Diagnosis present

## 2015-01-27 DIAGNOSIS — J90 Pleural effusion, not elsewhere classified: Secondary | ICD-10-CM | POA: Diagnosis present

## 2015-01-27 DIAGNOSIS — K7581 Nonalcoholic steatohepatitis (NASH): Secondary | ICD-10-CM | POA: Diagnosis present

## 2015-01-27 DIAGNOSIS — R0902 Hypoxemia: Secondary | ICD-10-CM | POA: Diagnosis present

## 2015-01-27 DIAGNOSIS — F419 Anxiety disorder, unspecified: Secondary | ICD-10-CM | POA: Diagnosis present

## 2015-01-27 DIAGNOSIS — E119 Type 2 diabetes mellitus without complications: Secondary | ICD-10-CM | POA: Diagnosis present

## 2015-01-27 DIAGNOSIS — K7030 Alcoholic cirrhosis of liver without ascites: Secondary | ICD-10-CM | POA: Diagnosis not present

## 2015-01-27 DIAGNOSIS — Z7722 Contact with and (suspected) exposure to environmental tobacco smoke (acute) (chronic): Secondary | ICD-10-CM | POA: Diagnosis present

## 2015-01-27 DIAGNOSIS — E8809 Other disorders of plasma-protein metabolism, not elsewhere classified: Secondary | ICD-10-CM | POA: Diagnosis present

## 2015-01-27 DIAGNOSIS — R188 Other ascites: Secondary | ICD-10-CM | POA: Diagnosis present

## 2015-01-27 DIAGNOSIS — I361 Nonrheumatic tricuspid (valve) insufficiency: Secondary | ICD-10-CM | POA: Diagnosis not present

## 2015-01-27 DIAGNOSIS — R5381 Other malaise: Secondary | ICD-10-CM | POA: Diagnosis present

## 2015-01-27 DIAGNOSIS — I10 Essential (primary) hypertension: Secondary | ICD-10-CM | POA: Diagnosis present

## 2015-01-27 DIAGNOSIS — K766 Portal hypertension: Secondary | ICD-10-CM | POA: Diagnosis present

## 2015-01-27 DIAGNOSIS — Z792 Long term (current) use of antibiotics: Secondary | ICD-10-CM | POA: Diagnosis not present

## 2015-01-27 DIAGNOSIS — R0602 Shortness of breath: Secondary | ICD-10-CM | POA: Diagnosis present

## 2015-01-27 DIAGNOSIS — K7681 Hepatopulmonary syndrome: Secondary | ICD-10-CM | POA: Diagnosis present

## 2015-01-27 DIAGNOSIS — Z823 Family history of stroke: Secondary | ICD-10-CM | POA: Diagnosis not present

## 2015-01-27 DIAGNOSIS — D61818 Other pancytopenia: Secondary | ICD-10-CM | POA: Diagnosis not present

## 2015-01-27 DIAGNOSIS — F17210 Nicotine dependence, cigarettes, uncomplicated: Secondary | ICD-10-CM | POA: Diagnosis present

## 2015-01-27 DIAGNOSIS — R0600 Dyspnea, unspecified: Secondary | ICD-10-CM | POA: Diagnosis not present

## 2015-01-27 DIAGNOSIS — Z7982 Long term (current) use of aspirin: Secondary | ICD-10-CM | POA: Diagnosis not present

## 2015-01-27 DIAGNOSIS — E43 Unspecified severe protein-calorie malnutrition: Secondary | ICD-10-CM | POA: Diagnosis present

## 2015-01-27 DIAGNOSIS — E785 Hyperlipidemia, unspecified: Secondary | ICD-10-CM | POA: Diagnosis present

## 2015-01-27 DIAGNOSIS — K7460 Unspecified cirrhosis of liver: Secondary | ICD-10-CM | POA: Diagnosis present

## 2015-01-27 DIAGNOSIS — E222 Syndrome of inappropriate secretion of antidiuretic hormone: Secondary | ICD-10-CM | POA: Diagnosis present

## 2015-01-27 DIAGNOSIS — K7290 Hepatic failure, unspecified without coma: Secondary | ICD-10-CM | POA: Diagnosis present

## 2015-01-27 DIAGNOSIS — Z79899 Other long term (current) drug therapy: Secondary | ICD-10-CM | POA: Diagnosis not present

## 2015-01-27 DIAGNOSIS — J948 Other specified pleural conditions: Secondary | ICD-10-CM | POA: Diagnosis not present

## 2015-01-27 DIAGNOSIS — I370 Nonrheumatic pulmonary valve stenosis: Secondary | ICD-10-CM | POA: Diagnosis not present

## 2015-01-27 LAB — BASIC METABOLIC PANEL
ANION GAP: 5 (ref 5–15)
BUN: 12 mg/dL (ref 6–20)
CALCIUM: 8.2 mg/dL — AB (ref 8.9–10.3)
CO2: 23 mmol/L (ref 22–32)
Chloride: 104 mmol/L (ref 101–111)
Creatinine, Ser: 1.05 mg/dL (ref 0.61–1.24)
GFR calc non Af Amer: >60 mL/min (ref >60)
GLUCOSE: 157 mg/dL — AB (ref 65–99)
Potassium: 3.9 mmol/L (ref 3.5–5.1)
Sodium: 132 mmol/L — ABNORMAL LOW (ref 135–145)

## 2015-01-27 LAB — GLUCOSE, CAPILLARY
GLUCOSE-CAPILLARY: 229 mg/dL — AB (ref 65–99)
Glucose-Capillary: 133 mg/dL — ABNORMAL HIGH (ref 65–99)
Glucose-Capillary: 164 mg/dL — ABNORMAL HIGH (ref 65–99)
Glucose-Capillary: 240 mg/dL — ABNORMAL HIGH (ref 65–99)

## 2015-01-27 MED ORDER — FUROSEMIDE 40 MG PO TABS
40.0000 mg | ORAL_TABLET | Freq: Two times a day (BID) | ORAL | Status: DC
  Administered 2015-01-27: 40 mg via ORAL
  Filled 2015-01-27 (×3): qty 1

## 2015-01-27 MED ORDER — SPIRONOLACTONE 100 MG PO TABS
200.0000 mg | ORAL_TABLET | Freq: Every day | ORAL | Status: DC
  Administered 2015-01-27 – 2015-01-29 (×3): 200 mg via ORAL
  Filled 2015-01-27 (×4): qty 2

## 2015-01-27 MED ORDER — FUROSEMIDE 10 MG/ML IJ SOLN
20.0000 mg | Freq: Two times a day (BID) | INTRAMUSCULAR | Status: DC
Start: 2015-01-27 — End: 2015-01-28
  Administered 2015-01-27: 20 mg via INTRAVENOUS
  Filled 2015-01-27 (×3): qty 2

## 2015-01-27 NOTE — Progress Notes (Signed)
[facility] PROGRESS NOTE Assessment/Plan: Dyspnea/ Pleural effusion, left - BNP 56 Echo showed an EF of 60%. Likely from liver cirrhosis. Cont to increase aldactone and lasix. - Thoracocentesis scheduled for this a.m. Labs for lites criteria pending. He will be transudate as in the past. - Troponins have remained negative. - consult PMT.  Protein-calorie malnutrition, severe - Ensure TID    Normocytic Anemia - unclear will have to follow up with PCP as an outpatient.  Controlled Diabetes mellitus without complication: - Improved control.  NASH/Cirrhosis of liver/thrombocytopenia/hypoalbumenemia: - increase aldactone and lasix. - Lytes stable, continue to monitor lytes.  Hyperlipidemia  Code Status: full Family Communication: none  Disposition Plan: inpatient home in 2-3 days  Consultants:  none  Procedures:  CT angio  Antibiotics:  None  HPI/Subjective: SOB unchanged.  Objective: Filed Vitals:   01/26/15 2100 01/27/15 0259 01/27/15 0300 01/27/15 0457  BP: 92/70 91/57 85/58  93/59  Pulse:  73  76  Temp:  97.6 F (36.4 C)  97.6 F (36.4 C)  TempSrc:  Oral  Oral  Resp:    16  Height:      Weight:      SpO2:  97%  97%    Intake/Output Summary (Last 24 hours) at 01/27/15 0802 Last data filed at 01/27/15 0300  Gross per 24 hour  Intake    660 ml  Output   1400 ml  Net   -740 ml   Filed Weights   01/24/15 1347 01/24/15 2127  Weight: 67.586 kg (149 lb) 66 kg (145 lb 8.1 oz)    Exam:  General: Alert, awake, oriented x3, in no acute distress.  HEENT: No bruits, no goiter.  Heart: Regular rate and rhythm. Lungs: Good air movement, clear Abdomen: Soft, nontender, nondistended, positive bowel sounds.  Neuro: Grossly intact, nonfocal.   Data Reviewed: Basic Metabolic Panel:  Recent Labs Lab 01/24/15 1352 01/25/15 0446 01/26/15 1050 01/27/15 0312  NA 132* 132* 131* 132*  K 3.9 3.5 4.0 3.9  CL 102 102 102 104  CO2 18* 24 23 23     GLUCOSE 140* 117* 258* 157*  BUN 24* 22* 15 12  CREATININE 1.24 1.15 0.96 1.05  CALCIUM 8.9 8.4* 8.6* 8.2*   Liver Function Tests:  Recent Labs Lab 01/24/15 1352  AST 46*  ALT 26  ALKPHOS 88  BILITOT 2.4*  PROT 7.5  ALBUMIN 2.5*   No results for input(s): LIPASE, AMYLASE in the last 168 hours. No results for input(s): AMMONIA in the last 168 hours. CBC:  Recent Labs Lab 01/24/15 1352 01/25/15 0446  WBC 4.6 3.3*  HGB 10.0* 9.1*  HCT 28.8* 25.9*  MCV 90.9 90.9  PLT 69* 48*   Cardiac Enzymes:  Recent Labs Lab 01/24/15 1148 01/24/15 1352 01/24/15 1620 01/25/15 0446 01/25/15 1105  TROPONINI <0.03 <0.03 <0.03 <0.03 <0.03   BNP (last 3 results)  Recent Labs  10/04/14 1125 11/19/14 1310 01/24/15 1352  BNP 65.8 77.4 56.2    ProBNP (last 3 results) No results for input(s): PROBNP in the last 8760 hours.  CBG:  Recent Labs Lab 01/26/15 0623 01/26/15 1124 01/26/15 1627 01/26/15 2135 01/27/15 0631  GLUCAP 134* 266* 186* 208* 133*    Recent Results (from the past 240 hour(s))  Urine culture     Status: None   Collection Time: 01/24/15 11:26 PM  Result Value Ref Range Status   Specimen Description URINE, CLEAN CATCH  Final   Special Requests NONE  Final   Culture  4,000 COLONIES/mL INSIGNIFICANT GROWTH  Final   Report Status 01/26/2015 FINAL  Final  Culture, body fluid-bottle     Status: None (Preliminary result)   Collection Time: 01/25/15 10:23 AM  Result Value Ref Range Status   Specimen Description FLUID LEFT PLEURAL  Final   Special Requests NONE  Final   Culture NO GROWTH 1 DAY  Final   Report Status PENDING  Incomplete  Gram stain     Status: None   Collection Time: 01/25/15 10:23 AM  Result Value Ref Range Status   Specimen Description FLUID LEFT PLEURAL  Final   Special Requests NONE  Final   Gram Stain   Final    ABUNDANT WBC PRESENT, PREDOMINANTLY MONONUCLEAR NO ORGANISMS SEEN    Report Status 01/25/2015 FINAL  Final      Studies: Dg Chest Port 1 View  01/25/2015   CLINICAL DATA:  Status post left thoracentesis for pleural effusion.  EXAM: PORTABLE CHEST - 1 VIEW  COMPARISON:  CT chest and PA and lateral chest 01/24/2015.  FINDINGS: No pneumothorax is identified after thoracentesis. No pleural effusion is seen. Prominent pulmonary interstitium is unchanged. Heart size is normal.  IMPRESSION: Negative for pneumothorax after thoracentesis.  No new abnormality.   Electronically Signed   By: Inge Rise M.D.   On: 01/25/2015 11:24   US Thoracentesis Asp Pleural Space W/img Guide  01/25/2015   INDICATION: Symptomatic L sided pleural effusion; small effusion  EXAM: US THORACENTESIS ASP PLEURAL SPACE W/IMG GUIDE  COMPARISON:  None.  MEDICATIONS: 5 cc 1% lidocaine  COMPLICATIONS: None immediate  TECHNIQUE: Informed written consent was obtained from the patient after a discussion of the risks, benefits and alternatives to treatment. A timeout was performed prior to the initiation of the procedure.  Initial ultrasound scanning demonstrates a small left pleural effusion. The lower chest was prepped and draped in the usual sterile fashion. 1% lidocaine was used for local anesthesia.  Under direct ultrasound guidance, a 19 gauge, 7-cm, Yueh catheter was introduced. An ultrasound image was saved for documentation purposes. The thoracentesis was performed. The catheter was removed and a dressing was applied. The patient tolerated the procedure well without immediate post procedural complication. The patient was escorted to have an upright chest radiograph.  FINDINGS: A total of approximately 180 cc of bloody fluid was removed. Requested samples were sent to the laboratory.  IMPRESSION: Successful ultrasound-guided L sided thoracentesis yielding 180 cc of pleural fluid.  Read by:  Lavonia Drafts [facility]   Electronically Signed   By: Marybelle Killings M.D.   On: 01/25/2015 10:26    Scheduled Meds: . atorvastatin  20 mg Oral Daily  . feeding  supplement (ENSURE ENLIVE)  237 mL Oral TID BM  . furosemide  40 mg Oral Daily  . insulin aspart  0-5 Units Subcutaneous QHS  . insulin aspart  0-9 Units Subcutaneous TID WC  . senna  1 tablet Oral Daily  . sodium chloride  3 mL Intravenous Q12H  . spironolactone  100 mg Oral Daily   Continuous Infusions:   Time Spent: 25 min   Charlynne Cousins  [facility] Pager [phone]. If 7PM-7AM, please contact night-coverage at [email or web address], password [email or web address] 01/27/2015, 8:02 AM

## 2015-01-27 NOTE — Progress Notes (Signed)
RN assisted patient to the bedside commode, RN noted patients saO2 dropped to 82% on 2 L of oxygen. Patient was very short of breath, spoke in 2-3 words and short sentences, accessory muscle was not used. RN bumped his oxygen level to 4 L and patient saO2 went up to 84%. RN safely assisted patient back to bed in high fowlers position and reminded patient to take deep breaths, saO2 has gone back up to 94% on 2 L of oxygen at rest. Call bell within reach, patient states he feels better. RN will continue to monitor.  Sharene Skeans, RN

## 2015-01-28 ENCOUNTER — Inpatient Hospital Stay (HOSPITAL_COMMUNITY): Payer: Medicare Other

## 2015-01-28 DIAGNOSIS — R0602 Shortness of breath: Secondary | ICD-10-CM

## 2015-01-28 DIAGNOSIS — J90 Pleural effusion, not elsewhere classified: Principal | ICD-10-CM

## 2015-01-28 DIAGNOSIS — Z515 Encounter for palliative care: Secondary | ICD-10-CM | POA: Insufficient documentation

## 2015-01-28 DIAGNOSIS — K7681 Hepatopulmonary syndrome: Secondary | ICD-10-CM | POA: Insufficient documentation

## 2015-01-28 DIAGNOSIS — R0902 Hypoxemia: Secondary | ICD-10-CM | POA: Insufficient documentation

## 2015-01-28 LAB — BASIC METABOLIC PANEL
Anion gap: 6 (ref 5–15)
BUN: 12 mg/dL (ref 6–20)
CALCIUM: 8.4 mg/dL — AB (ref 8.9–10.3)
CO2: 25 mmol/L (ref 22–32)
CREATININE: 0.94 mg/dL (ref 0.61–1.24)
Chloride: 103 mmol/L (ref 101–111)
GFR calc Af Amer: >60 mL/min (ref >60)
Glucose, Bld: 144 mg/dL — ABNORMAL HIGH (ref 65–99)
POTASSIUM: 5.1 mmol/L (ref 3.5–5.1)
Sodium: 134 mmol/L — ABNORMAL LOW (ref 135–145)

## 2015-01-28 LAB — GLUCOSE, CAPILLARY
GLUCOSE-CAPILLARY: 158 mg/dL — AB (ref 65–99)
Glucose-Capillary: 135 mg/dL — ABNORMAL HIGH (ref 65–99)
Glucose-Capillary: 136 mg/dL — ABNORMAL HIGH (ref 65–99)
Glucose-Capillary: 297 mg/dL — ABNORMAL HIGH (ref 65–99)

## 2015-01-28 MED ORDER — FUROSEMIDE 10 MG/ML IJ SOLN
40.0000 mg | Freq: Two times a day (BID) | INTRAMUSCULAR | Status: DC
Start: 2015-01-28 — End: 2015-01-29
  Administered 2015-01-28 – 2015-01-29 (×3): 40 mg via INTRAVENOUS
  Filled 2015-01-28 (×2): qty 4

## 2015-01-28 MED ORDER — FUROSEMIDE 10 MG/ML IJ SOLN
INTRAMUSCULAR | Status: AC
  Filled 2015-01-28: qty 4

## 2015-01-28 MED ORDER — HEPARIN SODIUM (PORCINE) 5000 UNIT/ML IJ SOLN
5000.0000 [IU] | Freq: Three times a day (TID) | INTRAMUSCULAR | Status: DC
  Administered 2015-01-28 – 2015-01-29 (×3): 5000 [IU] via SUBCUTANEOUS
  Filled 2015-01-28 (×6): qty 1

## 2015-01-28 NOTE — Progress Notes (Signed)
Patient ID: Garrett Turner, male   DOB: Dec 25, 1950, 64 y.o.   MRN: [id number]   [name] requested for 7/11  L thora was performed 01/25/15 yielding 180 cc fluid Pleural effusion was drained with that amount.  Limited Chest [place] today over both L and R lungs/pleura Shows very tiny sliver of fluid at Left  Not amenable to thoracentesis  Dr Aileen Fass aware Agreeable to NOT perform thora today  will send pt to room

## 2015-01-28 NOTE — Consult Note (Signed)
ASked to see Garrett Turner by Dr. Olevia Turner for palliative consult for goals of care.  Impression: 1. NASH 2. 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 Deconditioning  Recommendations: 1. Pulmonary work-up as outlined in Dr. Pura Turner note 2. DNR/DNI, no artificial hydration or nutrition as heroic measures 3. Completion of MOST - will do in AM  HPI Patient is a 64 year old male with hypertension, hyperlipidemia, anxiety, type 2 diabetes mellitus, liver cirrhosis due to NASH presented to ED with shortness of breath, worsening over the last 10 days. History was obtained from the patient who reported that he has chronic shortness of breath however in the last 10 days it has been getting progressively worse. He states that he gets short of breath from walking from his bedroom to the bathroom or getting the food from the kitchen. He lives at home alone and uses cane or walker when ambulating. Patient says he has off and on cough with clear sputum. He gets chest tightness when he gets very short of breath on ambulation. He denies any fevers or chills. He has been sleeping on his couch for the last 10 days as it is closer to the bathroom and the kitchen. Patient reports that he is in process of getting home O2 setup [facility]. He denies any fevers or chills. He denies any abdominal bloating or weight gain, reports actually he has lost weight, no change in his appetite. In ED, patient's O2 sats were 98% on room air however when he was stood up to walk, O2 sats dropped to 85% on room air and hospitalist service was requested for admission. ED workup showed troponins 2 negative, hemoglobin 10, platelets 69, albumen 2.5, BMET unremarkable Chest x-ray negative for any pneumonia. CT angiographic the chest was negative for any pulmonary emboli, small to moderate-sized left pleural effusion, changes of cirrhosis of liver with portal venous hypertension, varices and ascites.  Chart  reviewed: followed at indigent clinic. He was seen in ED June 19th, '16 for toe fracture.He was hospitalized in May '16 for HCAP/pleural effusion with SOB. He was hospitalized in March '17 for PNA. He was hospitalized in Jan '16 for possible CVA with negative stroke evaluation and did have SNF/rehab stay at [facility].   Last OV December 25, 2014: post hospital visit after admission for SOB. He was worked up for pleural effusion - transudate thought 2/2 cirrhosis. His symptoms improved after thoracentesis. MRI abdomen reveals advanced cirrhosis with portal hypertension, no lesions to suggest Garrett Turner. At outpatient visit HTN controlled; cirrhosis stable with GI consult to follow; lipids controlled.  During hospitalization he has continued to have SOB/DOE with desaturation with activity. He is for thoracentesis today for left pleural effusion.  Per EMR record in May '16 he changed status from full code to DNR.  Scheduled Meds:  atorvastatin  20 mg Oral Daily   feeding supplement (ENSURE ENLIVE)  237 mL Oral TID BM   furosemide  40 mg Intravenous BID   heparin subcutaneous  5,000 Units Subcutaneous 3 times per day   insulin aspart  0-5 Units Subcutaneous QHS   insulin aspart  0-9 Units Subcutaneous TID WC   senna  1 tablet Oral Daily   sodium chloride  3 mL Intravenous Q12H   spironolactone  200 mg Oral Daily   Continuous Infusions:  PRN Meds:.acetaminophen **OR** acetaminophen, alum & mag hydroxide-simeth, HYDROcodone-acetaminophen, HYDROmorphone (DILAUDID) injection, ondansetron **OR** ondansetron (ZOFRAN) IV, traMADol  Past Medical History  Diagnosis Date   Hypertension  High cholesterol    Anxiety disorder    Pancytopenia    UTI (lower urinary tract infection)    Depression    Anxiety    Leg cramps    Type II diabetes mellitus    Liver cirrhosis secondary to NASH    Infectious hepatitis 1971   Pneumonia ~ 07/2014; 09/2014    Past Surgical History  Procedure  Laterality Date   Tonsillectomy     Esophagogastroduodenoscopy  11/04/2011    Procedure: ESOPHAGOGASTRODUODENOSCOPY (EGD);  Surgeon: Garrett Silence, MD;  Location: [facility];  Service: Endoscopy;  Laterality: N/A;   Colonoscopy  11/04/2011    Procedure: COLONOSCOPY;  Surgeon: Garrett Silence, MD;  Location: [facility];  Service: Endoscopy;  Laterality: N/A;   Vasectomy  1985   Fixation kyphoplasty lumbar spine      "L5"   Cardiac catheterization  "years ago"   Inguinal hernia repair Bilateral 06/2013    History   Social History   Marital Status: Widowed    Spouse Name: N/A   Number of Children: 3 daughters, 5 grandchildren   Years of Education: HS[employer]   Social History Main Topics   Smoking status: Passive Smoke Exposure - Never Smoker    Types: Cigarettes   Smokeless tobacco: Former Systems developer    Types: Chew     Comment: "used to chew tobacco when I played baseball"   Alcohol Use: 0.6 oz/week    1 Standard drinks or equivalent per week     Comment: 10/04/2014 "haven't drank anything at all for 6 months because of the cirrhosis; before that a couple drinks/month"   Drug Use: No   Sexual Activity: Not Currently   Other Topics Concern   None   Social History Narrative  [name] was married for 30 years and divorced. He has 3 daughters from that marriage and 5 grandchildren. He remarried but was widowed after 11 years. 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 He is currently retired, lives alone and gets by. He has a very close friend/companion who was his second wife's good friend and continues to look after him.  Palliative Care Status: DNR/DNI, no tube feeds  Palliative Prophylaxis: no pain issues; on senekot and no constipation problems  Palliative Review of Systems: No anxiety, no pain, no GI problems Cor - no chest pain or cardiac c/o Pul - chronic SOB with DOE GI - no active  issues   PE:  Filed Vitals:   01/28/15 1051  BP: 99/58   Pulse: 60  Temp: 98.1  Resp: 18   General: older man looking older than his stated age. Thin. No distress HEENT: C&S clear, PERRLA Cor: 2+ radial pulse, quiet precordium, RRR Pul: no active SOB at rest, speaking whole sentences. No rales or wheezes. Good BS Abd: soft, no guarding or rebound Neuro: A&O x 3, no focal findings  CBC Latest Ref Rng 01/25/2015 01/24/2015 11/22/2014  WBC 4.0 - 10.5 K/uL 3.3(L) 4.6 2.8(L)  Hemoglobin 13.0 - 17.0 g/dL 9.1(L) 10.0(L) 8.8(L)  Hematocrit 39.0 - 52.0 % 25.9(L) 28.8(L) 25.0(L)  Platelets 150 - 400 K/uL 48(L) 69(L) 55(L)    CMP Latest Ref Rng 01/28/2015 01/27/2015 01/26/2015  Glucose 65 - 99 mg/dL 144(H) 157(H) 258(H)  BUN 6 - 20 mg/dL 12 12 15   Creatinine 0.61 - 1.24 mg/dL 0.94 1.05 0.96  Sodium 135 - 145 mmol/L 134(L) 132(L) 131(L)  Potassium 3.5 - 5.1 mmol/L 5.1 3.9 4.0  Chloride 101 - 111 mmol/L 103 104 102  CO2 22 - 32 mmol/L 25 23 23   Calcium 8.9 - 10.3 mg/dL 8.4(L) 8.2(L) 8.6(L)  Total Protein 6.5 - 8.1 g/dL - - -  Total Bilirubin 0.3 - 1.2 mg/dL - - -  Alkaline Phos 38 - 126 U/L - - -  AST 15 - 41 U/L - - -  ALT 17 - 63 U/L - - -   Last A1C 7.6% 01/24/15   Imaging CXR 01/28/15: IMPRESSION: Areas of interstitial fibrosis bilaterally, stable. No edema or consolidation. Question of minimal left pleural effusion. No change in cardiac silhouette.  U/S chest 01/28/15: IMPRESSION: Minimal left effusion and left base atelectasis.  A/P 1. DM - adequate control 2. SOB - working dx hepato-pulmonary syndrome. Based on age and comorbidities unlikely candidate for liver transplant  Plan diagnositic w/u per pulmonary   Oxygen satruration testing for home oxygen  3. Social - may need long term care vs home with assistance  4. Palliative care - long discussion about ACP and goals of care: he is clear about DNR/DNI status and desire for a good death when the time comes.   Plan Will  complete MOST 7/12  Time spent with patient - 70 min Greater than 50% of encounter spent on education and counseling  Thank you for the consult.   Adella Hare, MD, [place] Team [phone]

## 2015-01-28 NOTE — Progress Notes (Signed)
[facility] PROGRESS NOTE Assessment/Plan: Dyspnea/ Pleural effusion, left - BNP 56 Echo showed an EF of 60%. Likely from liver cirrhosis. Cont aldactone and lasix. K trending up increase lasix - CT of the chest showed no PE or no pneumonia does show left-sided pleural effusion. An initial thoracentesis unsuccessful as his blood pressure was less than 100. His blood pressures are reasonably less than 100. - Repeated Thoracocentesis scheduled for 7.11.2016. - consulted PMT poor prognosis.  Protein-calorie malnutrition, severe - Ensure TID    Normocytic Anemia - unclear will have to follow up with PCP as an outpatient.  Controlled Diabetes mellitus without complication: - Improved control.  NASH/Cirrhosis of liver/thrombocytopenia/hypoalbumenemia: - Cont aldactone and lasix. - monitor lytes.  Hyperlipidemia  Code Status: full Family Communication: none  Disposition Plan: inpatient home in 2-3 days  Consultants:  none  Procedures:  CT angio  Antibiotics:  None  HPI/Subjective: SOB unchanged.  Objective: Filed Vitals:   01/27/15 1443 01/27/15 2100 01/27/15 2130 01/28/15 0400  BP: 106/62 90/52 101/56 93/58  Pulse: 81   75  Temp: 97.5 F (36.4 C)   98.1 F (36.7 C)  TempSrc: Oral   Oral  Resp: 17   18  Height:      Weight:      SpO2: 98%   98%    Intake/Output Summary (Last 24 hours) at 01/28/15 0824 Last data filed at 01/28/15 0400  Gross per 24 hour  Intake    577 ml  Output   1050 ml  Net   -473 ml   Filed Weights   01/24/15 1347 01/24/15 2127  Weight: 67.586 kg (149 lb) 66 kg (145 lb 8.1 oz)    Exam:  General: Alert, awake, oriented x3, in no acute distress.  HEENT: No bruits, no goiter.  Heart: Regular rate and rhythm. Lungs: Good air movement, clear Abdomen: Soft, nontender, nondistended, positive bowel sounds.  Neuro: Grossly intact, nonfocal.   Data Reviewed: Basic Metabolic Panel:  Recent Labs Lab 01/24/15 1352  01/25/15 0446 01/26/15 1050 01/27/15 0312 01/28/15 0409  NA 132* 132* 131* 132* 134*  K 3.9 3.5 4.0 3.9 5.1  CL 102 102 102 104 103  CO2 18* 24 23 23 25   GLUCOSE 140* 117* 258* 157* 144*  BUN 24* 22* 15 12 12   CREATININE 1.24 1.15 0.96 1.05 0.94  CALCIUM 8.9 8.4* 8.6* 8.2* 8.4*   Liver Function Tests:  Recent Labs Lab 01/24/15 1352  AST 46*  ALT 26  ALKPHOS 88  BILITOT 2.4*  PROT 7.5  ALBUMIN 2.5*   No results for input(s): LIPASE, AMYLASE in the last 168 hours. No results for input(s): AMMONIA in the last 168 hours. CBC:  Recent Labs Lab 01/24/15 1352 01/25/15 0446  WBC 4.6 3.3*  HGB 10.0* 9.1*  HCT 28.8* 25.9*  MCV 90.9 90.9  PLT 69* 48*   Cardiac Enzymes:  Recent Labs Lab 01/24/15 1148 01/24/15 1352 01/24/15 1620 01/25/15 0446 01/25/15 1105  TROPONINI <0.03 <0.03 <0.03 <0.03 <0.03   BNP (last 3 results)  Recent Labs  10/04/14 1125 11/19/14 1310 01/24/15 1352  BNP 65.8 77.4 56.2    ProBNP (last 3 results) No results for input(s): PROBNP in the last 8760 hours.  CBG:  Recent Labs Lab 01/27/15 0631 01/27/15 1117 01/27/15 1612 01/27/15 2130 01/28/15 0631  GLUCAP 133* 164* 240* 229* 135*    Recent Results (from the past 240 hour(s))  Urine culture     Status: None   Collection Time:  01/24/15 11:26 PM  Result Value Ref Range Status   Specimen Description URINE, CLEAN CATCH  Final   Special Requests NONE  Final   Culture 4,000 COLONIES/mL INSIGNIFICANT GROWTH  Final   Report Status 01/26/2015 FINAL  Final  Culture, body fluid-bottle     Status: None (Preliminary result)   Collection Time: 01/25/15 10:23 AM  Result Value Ref Range Status   Specimen Description FLUID LEFT PLEURAL  Final   Special Requests NONE  Final   Culture NO GROWTH 2 DAYS  Final   Report Status PENDING  Incomplete  Gram stain     Status: None   Collection Time: 01/25/15 10:23 AM  Result Value Ref Range Status   Specimen Description FLUID LEFT PLEURAL  Final     Special Requests NONE  Final   Gram Stain   Final    ABUNDANT WBC PRESENT, PREDOMINANTLY MONONUCLEAR NO ORGANISMS SEEN    Report Status 01/25/2015 FINAL  Final     Studies: No results found.  Scheduled Meds: . atorvastatin  20 mg Oral Daily  . feeding supplement (ENSURE ENLIVE)  237 mL Oral TID BM  . furosemide      . furosemide  40 mg Intravenous BID  . insulin aspart  0-5 Units Subcutaneous QHS  . insulin aspart  0-9 Units Subcutaneous TID WC  . senna  1 tablet Oral Daily  . sodium chloride  3 mL Intravenous Q12H  . spironolactone  200 mg Oral Daily   Continuous Infusions:   Time Spent: 25 min   Charlynne Cousins  [facility] Pager [phone]. If 7PM-7AM, please contact night-coverage at [email or web address], password [facility] 01/28/2015, 8:24 AM  LOS: 1 day

## 2015-01-28 NOTE — Consult Note (Signed)
Name: Garrett Turner MRN: [id number] DOB: [birth year]    ADMISSION DATE:  01/24/2015 CONSULTATION DATE: 01/28/2015  REFERRING MD : Tammi Klippel  CHIEF COMPLAINT:  Dyspnea  BRIEF PATIENT DESCRIPTION: 64 yo male with history of NASH cirrhosis admitted 7/7 with increased SOB over baseline. Primary team requested consult due to negative workup to date for explaination of symptomatology.   SIGNIFICANT EVENTS  7/7: Admitted to [facility] for SOB 7/8 Left thoracentesis performed by IR 149ml out 7/9: STUDIES:  Echo 7/8: Left ventricle: The cavity size was normal. Wall thickness wasnormal. Systolic function was normal. The estimated ejection fraction was in the range of 60% to 65%. Wall motion was normal; there were no regional wall motion abnormalities. Left ventricular diastolic function parameters were normal.   HISTORY OF PRESENT ILLNESS:  Garrett Turner is a 64yo male with a PMH of HTN, hyperlipidemia, anxiety, T2DM, liver cirrhosis due to NASH who was admitted to [facility] on 7/7 after a 3 week history of worsening SOB. Chart notes reveal his O2 sats decrease into the low 80's when ambulating on 2L [place]. He does not wear home O2 at baseline. His workup since his admission has revealed a normal ejection fraction of 60% (7/8), unremarkable BNP, and CT negative for pulmonary embolus. He had a small left pleural effusion drained by IR. IR revaluated 7/11 for possible repeat thoracentesis but US revealed a small amount of fluid which could not safely be drained and this is unlikely the cause of his presenting symptoms. Further he has been successfully diuresed and is currently 2liters negative. This is concerning for hepatopulmonary syndrome and would require further diagnostics including a repeat echo with bubble study for diagnosis.   PAST MEDICAL HISTORY :   has a past medical history of Hypertension; High cholesterol; Anxiety disorder; Pancytopenia; UTI (lower urinary tract infection); Depression;  Anxiety; Leg cramps; Type II diabetes mellitus; Liver cirrhosis secondary to NASH; Infectious hepatitis (1971); and Pneumonia (~ 07/2014; 09/2014).  has past surgical history that includes Tonsillectomy; Esophagogastroduodenoscopy (11/04/2011); Colonoscopy (11/04/2011); Vasectomy (1985); Fixation kyphoplasty lumbar spine; Cardiac catheterization ("years ago"); and Inguinal hernia repair (Bilateral, 06/2013). Prior to Admission medications   Medication Sig Start Date End Date Taking? Authorizing Provider  atorvastatin (LIPITOR) 20 MG tablet Take 1 tablet (20 mg total) by mouth daily. 12/25/14  Yes Lorayne Marek, MD  diphenhydrAMINE-zinc acetate (BENADRYL) cream Apply topically 3 (three) times daily as needed for itching. 08/24/14  Yes Kinnie Feil, MD  furosemide (LASIX) 40 MG tablet Take 1.5 tablets (60 mg total) by mouth daily. 12/25/14  Yes Lorayne Marek, MD  metFORMIN (GLUCOPHAGE) 1000 MG tablet Take 1 tablet (1,000 mg total) by mouth 2 (two) times daily with a meal. 12/25/14  Yes Deepak Advani, MD  propranolol (INDERAL) 10 MG tablet Take 1 tablet (10 mg total) by mouth 2 (two) times daily. 12/25/14  Yes Lorayne Marek, MD  senna (SENOKOT) 8.6 MG tablet Take 1 tablet by mouth daily.   Yes Historical Provider, MD  spironolactone (ALDACTONE) 100 MG tablet Take 1 tablet (100 mg total) by mouth 2 (two) times daily. 12/25/14  Yes Lorayne Marek, MD  traMADol (ULTRAM) 50 MG tablet Take 1 tablet (50 mg total) by mouth every 6 (six) hours as needed for moderate pain. 01/07/15  Yes Merryl Hacker, MD  aspirin EC 81 MG EC tablet Take 1 tablet (81 mg total) by mouth daily. 08/24/14   Kinnie Feil, MD  cephALEXin (KEFLEX) 500 MG capsule Take 1 capsule (500  mg total) by mouth 4 (four) times daily. 01/07/15   Merryl Hacker, MD  escitalopram (LEXAPRO) 20 MG tablet Take 1 tablet (20 mg total) by mouth daily. 08/24/14   Kinnie Feil, MD  feeding supplement, ENSURE COMPLETE, (ENSURE COMPLETE) LIQD Take 237 mLs by mouth 2  (two) times daily between meals. 10/09/14   Silver Huguenin Elgergawy, MD  hydrocortisone (ANUSOL-HC) 2.5 % rectal cream Apply topically 2 (two) times daily. 08/24/14   Kinnie Feil, MD  lactulose (CHRONULAC) 10 GM/15ML solution Take 30 mLs (20 g total) by mouth daily. 10/09/14   Silver Huguenin Elgergawy, MD  mirtazapine (REMERON SOL-TAB) 15 MG disintegrating tablet Take 1 tablet (15 mg total) by mouth at bedtime. 08/24/14   Kinnie Feil, MD   No Known Allergies  FAMILY HISTORY:  family history includes Coronary artery disease in an other family member; Heart disease in his father and mother; Heart failure in his father and mother; Hypertension in his father, mother, and another family member; Obesity in his daughter; Stroke in his paternal grandmother. SOCIAL HISTORY:  reports that he has been passively smoking Cigarettes.  He has quit using smokeless tobacco. His smokeless tobacco use included Chew. He reports that he drinks about 0.6 oz of alcohol per week. He reports that he does not use illicit drugs.  REVIEW OF SYSTEMS:   Constitutional: Negative for fever, chills, weight loss, malaise/fatigue and diaphoresis.  HENT: Negative for hearing loss, ear pain, nosebleeds, congestion, sore throat, neck pain, tinnitus and ear discharge.   Eyes: Negative for blurred vision, double vision, photophobia, pain, discharge and redness.  Respiratory: Negative for cough, hemoptysis, sputum production, shortness of breath, wheezing and stridor.   Cardiovascular: Negative for chest pain, palpitations, orthopnea, claudication, leg swelling and PND.  Gastrointestinal: Negative for heartburn, nausea, vomiting, abdominal pain, diarrhea, constipation, blood in stool and melena.  Genitourinary: Negative for dysuria, urgency, frequency, hematuria and flank pain.  Musculoskeletal: Negative for myalgias, back pain, joint pain and falls.  Skin: Negative for itching and rash.  Neurological: Negative for dizziness, tingling,  tremors, sensory change, speech change, focal weakness, seizures, loss of consciousness, weakness and headaches.  Endo/Heme/Allergies: Negative for environmental allergies and polydipsia. Does not bruise/bleed easily.  SUBJECTIVE: SOB which has increased from baseline over the past 3 weeks.  VITAL SIGNS: Temp:  [98.1 F (36.7 C)] 98.1 F (36.7 C) (07/11 0400) Pulse Rate:  [60-75] 60 (07/11 1051) Resp:  [18] 18 (07/11 0400) BP: (90-101)/(52-60) 100/60 mmHg (07/11 0730) SpO2:  [98 %-100 %] 100 % (07/11 1051)  PHYSICAL EXAMINATION: General:  Chronically ill appearing gentleman with wasting sitting up in bed in NAD Neuro:  Alert and oriented x3, CNII-XII grossly intact HEENT: no scleral icterus, PERRL 42mm Brisk Cardiovascular: S1&S2 audible, no rub, gallops, or murmurs Lungs: fine rales in right base, rales in left base, otherwise clear Abdomen:  Soft, nontender, nondistened Musculoskeletal: grossly intact Skin:  Pale nailbeds without clubbing, Slight jaundice   Recent Labs Lab 01/26/15 1050 01/27/15 0312 01/28/15 0409  NA 131* 132* 134*  K 4.0 3.9 5.1  CL 102 104 103  CO2 23 23 25   BUN 15 12 12   CREATININE 0.96 1.05 0.94  GLUCOSE 258* 157* 144*    Recent Labs Lab 01/24/15 1352 01/25/15 0446  HGB 10.0* 9.1*  HCT 28.8* 25.9*  WBC 4.6 3.3*  PLT 69* 48*   [place] Chest  01/28/2015   CLINICAL DATA:  Left pleural effusion  EXAM: CHEST ULTRASOUND  COMPARISON:  Chest radiograph March 28, 2015  FINDINGS: The left chest was interrogated by ultrasound. There is a minimal left pleural effusion. There is left base atelectasis.  IMPRESSION: Minimal left effusion and left base atelectasis.   Electronically Signed   By: Lowella Grip III M.D.   On: 01/28/2015 14:28   Dg Chest Port 1 View  01/28/2015   CLINICAL DATA:  Shortness of Breath  EXAM: PORTABLE CHEST - 1 VIEW  COMPARISON:  January 25, 2015 pain August 14, 2014  FINDINGS: There is underlying interstitial fibrosis, stable. There  is no frank edema or consolidation. There is a questionable minimal left pleural effusion. Heart is upper normal in size with pulmonary vascularity within normal limits. No pneumothorax. No adenopathy. There is degenerative change in each shoulder.  IMPRESSION: Areas of interstitial fibrosis bilaterally, stable. No edema or consolidation. Question of minimal left pleural effusion. No change in cardiac silhouette.   Electronically Signed   By: Lowella Grip III M.D.   On: 01/28/2015 14:59    Assessment:  SOB/hypoxemia: unclear etiology r/o hepatopulmonary syndrome -Supplemental O2 -Home O2 tank in room for patient -maintain sats  88-92% -2D echo with bubble study-presence of left atrial bubbles would be positive screening test -would consider speaking with GI to determine transplant eligiblity  Small Left Pleural effusion-not significant enough for symptomatology  -continue current diuretic regimen    Pulmonary and [place] Pager: [phone]  01/28/2015, 3:19 PM  Attending Note:  64 year old male with history of NASH and associated cirrhosis and liver failure who presents to the hospital with the CC of SOB. CTA was performed that I reviewed myself that showed a small left sided pleural effusion with peripheral chronic scarring. Echo cardiogram was negative for CHF, EF of 65% and PAP of 26 (which is interesting in a man his age with some TR). Pro-BNP is negative. [name] was limited in the amount drawn but transudative by protein and exudative by LDH but again inconclusive. 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 Diagnostically, will need a repeat echo with a bubble study but given the fact that there is no treatment for that I would argue the  merit of ordering the study in the first place. Discussed with primary MD and PCCM-NP.  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 - Supplemental O2. - Will need an ambulatory desaturation study to qualify for home O2.  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 - Will order a 2D echo with a bubble study for diagnostic purposes. - Eventually liver transplant is the only viable treatment here. - Supplemental O2. - Would consider testing quantity of nitric oxide in expired air but that test is not available here.  Hypoxemia: - Supplemental O2. - Ambulatory desaturation study ordered to qualify for home O2. - Titrate O2 for sat of 88-92%.  Physical deconditioning: likely a contributory factor in SOB. - Physical therapy as ordered. - Ambulate. - Would benefit from cardiopulmonary rehab as outpatient. - Check NIF and vital capacity.  Pleural effusion: due to cirrhosis and associated ascites. [name] done. - Fluid analysis does not appear to be infected, d/c abx. - Maintain at dry weight.  Liver failure: driver of this entire case. - Would recommend evaluation in a transplant center. -  Avoid further liver insult.  Palliative: Patient is clearly end stage, while not actively dying right now would benefit from a goals of care discussion and symptom management as outpatient if not a transplant candidate. Palliative already seen patient will await input.  Patient seen and examined, agree with above note. I dictated the care and orders written for this patient under my direction.  Rush Farmer, MD [phone]

## 2015-01-28 NOTE — Progress Notes (Signed)
Inpatient Diabetes Program Recommendations  AACE/ADA: New Consensus Statement on Inpatient Glycemic Control (2013)  Target Ranges:  Prepandial:   less than 140 mg/dL      Peak postprandial:   less than 180 mg/dL (1-2 hours)      Critically ill patients:  140 - 180 mg/dL   Results for NAI, DASCH (MRN [id number]) as of 01/28/2015 09:26  Ref. Range 01/27/2015 06:31 01/27/2015 11:17 01/27/2015 16:12 01/27/2015 21:30 01/28/2015 06:31  Glucose-Capillary Latest Ref Range: 65-99 mg/dL 133 (H) 164 (H) 240 (H) 229 (H) 135 (H)   CBG's slightly greater than goal.  Consider increasing Novolog to moderate tid with meals and HS.   Thanks, Adah Perl, RN, BC-ADM Inpatient Diabetes Coordinator Pager [phone] (8a-5p)

## 2015-01-28 NOTE — Progress Notes (Addendum)
Occupational Therapy Treatment Patient Details Name: Garrett Turner MRN: [id number] DOB: March 09, 1951 Today's Date: 01/28/2015    History of present illness Patient is a 64 year old male with hypertension, hyperlipidemia, anxiety, type 2 diabetes mellitus, liver cirrhosis due to NASH presented to ED with shortness of breath, worsening over the last 10 days. History was obtained from the patient who reported that he has chronic shortness of breath however in the last 10 days it has been getting progressively worse. He states that he gets short of breath from walking from his bedroom to the bathroom or getting the food from the kitchen. He lives at home alone and uses cane or walker when ambulating. Patient says he has off and on cough with clear sputum. He gets chest tightness when he gets very short of breath on ambulation. He denies any fevers or chills. He has been sleeping on his couch for the last 10 days as it is closer to the bathroom and the kitchen.  Pt found to have pulmonary effusion and underwent thoracentesis 01/25/15   OT comments  Pt limited due to drop in O2 sats. See vital section below. Continue to recommend SNF for rehab.  Follow Up Recommendations  SNF;Supervision/Assistance - 24 hour    Equipment Recommendations  3 in 1 bedside comode    Recommendations for Other Services      Precautions / Restrictions Precautions Precautions: Fall Precaution Comments: DOE; decreased O2 sats with mobility/little activity Restrictions Weight Bearing Restrictions: No       Mobility Bed Mobility Overal bed mobility: Needs Assistance Bed Mobility: Supine to Sit;Sit to Supine     Supine to sit: Supervision;HOB elevated Sit to supine: Supervision                       Balance    No LOB sitting EOB during functional tasks.                               ADL Overall ADL's : Needs assistance/impaired     Grooming: Wash/dry face;Applying deodorant;Sitting;Set  up;Supervision/safety   Upper Body Bathing: Set up;Supervision/ safety;Sitting       Upper Body Dressing : Set up;Supervision/safety;Sitting                     General ADL Comments: Pt limited due to decreased in O2 sats. Educated on energy conservation techniques. Explained benefit of therapy.      Vision                     Perception     Praxis      Cognition  Awake/Alert Behavior During Therapy: WFL for tasks assessed/performed Overall Cognitive Status: Within Functional Limits for tasks assessed                       Extremity/Trunk Assessment               Exercises     Shoulder Instructions       General Comments      Pertinent Vitals/ Pain       Pain Assessment: 0-10 Pain Score:  (5-6) Pain Location: left side/back Pain Descriptors / Indicators: Throbbing Pain Intervention(s): Repositioned;Monitored during session   Pt on 2L of O2 at beginning of session. O2 in 90s but then dropped to 70s-80s. Bumped pt up to 3L and then bumped up to 4L  of O2 and O2 remaining in 80s. Returned to supine and O2 at 94% towards end of session. Left pt on 4L of O2.  Home Living                                          Prior Functioning/Environment              Frequency Min 2X/week     Progress Toward Goals  OT Goals(current goals can now be found in the care plan section)  Progress towards OT goals: Progressing toward goals  Acute Rehab OT Goals Patient Stated Goal: not stated OT Goal Formulation: With patient Time For Goal Achievement: 02/08/15 Potential to Achieve Goals: Good ADL Goals Pt Will Perform Grooming: with supervision;standing Pt Will Perform Upper Body Bathing: with set-up;sitting Pt Will Perform Lower Body Bathing: with supervision;sit to/from stand Pt Will Perform Upper Body Dressing: with set-up;sitting Pt Will Perform Lower Body Dressing: with supervision;sit to/from stand Pt Will Transfer  to Toilet: with supervision;ambulating;regular height toilet;bedside commode;grab bars Pt Will Perform Toileting - Clothing Manipulation and hygiene: with supervision;sit to/from stand Pt Will Perform Tub/Shower Transfer: Shower transfer;with supervision;ambulating;shower seat;grab bars Additional ADL Goal #1: Pt will be independent with energy conservation techniques   Plan Discharge plan remains appropriate    Co-evaluation                 End of Session Equipment Utilized During Treatment: Oxygen   Activity Tolerance Patient limited by fatigue;Other (comment) (drop in O2 sats)   Patient Left in bed;with call bell/phone within reach;with nursing/sitter in room   Nurse Communication Mobility status;Other (comment) (O2 sats)        Time: 7989-2119 OT Time Calculation (min): 21 min  Charges: OT General Charges $OT Visit: 1 Procedure OT Treatments $Self Care/Home Management : 8-22 mins  Benito Mccreedy OTR/L [phone] 01/28/2015, 10:53 AM

## 2015-01-28 NOTE — Clinical Social Work Note (Addendum)
Clinical Social Work Assessment  Patient Details  Name: Garrett Turner MRN: [id number] Date of Birth: [birth year]  Date of referral:  01/27/15               Reason for consult:  Facility Placement                Permission sought to share information with:  Other, Family Supports, Chartered certified accountant granted to share information::  Yes, Verbal Permission Granted  Name::     [facility], Daughter Garrett Turner  Agency::     Relationship::     Contact Information:     Housing/Transportation Living arrangements for the past 2 months:  [place] of Information:  Patient Patient Interpreter Needed:  None Criminal Activity/Legal Involvement Pertinent to Current Situation/Hospitalization:  No - Comment as needed Significant Relationships:  Adult Children, Friend (Pt states he is completely extranged from 1 of 3 daughters; rarely has contact with another daughter) Lives with:  Self Do you feel safe going back to the place where you live?  No Need for family participation in patient care:  Yes (Comment) (Patient gives permission to contact daughter)  Care giving concerns:  Patient reports that he lives alone and has very limited family support; he has 3 daughters but only 1 is trying to assist him when she is able as she had 3 children. Of the other 2 daughters- he is completely estranged from one and rarely speaks to the other.  He states the he has become weaker and is struggling with the thought of continuing to try to live alone. He does not feel he can manage at home at this time. Patient agrees to let his daughter Garrett Turner know what's going on but he does not want to "burden her further" with his issues.   Social Worker assessment / plan:  CSW met with this 64 year old male this afternoon due to PT's recommendation for SNF placement.  Patient is currently Observation status and has not had a qualifying 3 day hospital stay in the past 30 days.  Patient  was noted to be lying in bed; appeared to be sleeping when CSW entered the room but was actually awake. Discussed PT's recommendation for SNF and he stated that he was at [facility] in February of this year for a 20 day stay.  He returned home after this for continued home care and has [place] at home.  His family support is very limited.  Patient would like to return to [facility] is possible but is unable to afford to pay privately; he verbalizes feeling of despondency and would not make any eye contact with CSW during visit.  Employment status:  Retired Forensic scientist:  Medicare PT Recommendations:   SNF Information / Referral to community resources:  [place]  Patient/Family's Response to care: Patient wants SNF placement but is aware that at present this is not an option for him.  He verbalizes that he is "tired and would be better off if he was to just die."  Patient clarified strongly that he has no desire to harm himself; no SI or HI; only that he feels his health issues will only continue to increase and he doesn't know what he is going to do. He feels that his one daughter is concerned about him but she cannot do anymore to help him than what she is doing now.  Patient/Family's Understanding of and Emotional Response to Diagnosis, Current  Treatment, and Prognosis:  Patient stated "today I'm really feeling depressed- I don't know what I'm going to do."  He states that he does not feel well and would not make eye contact with CSW. He has a good understanding of his current medical condition and prognosis. He would like to seek long term care if possible at [facility]. Discussed bed search process and possibilities. He verbalized understanding but really wants to return to [facility]. Will discuss with weekday CSW to determine what possible options might be available for patient as he will not have a new Medicare benefit period and does not have any secondary  insurance.    Emotional Assessment Appearance:  Appears older than stated age Attitude/Demeanor/Rapport:   (Lethargic, no eye contact, very sad demeanor) Affect (typically observed):  Sad, Quiet, Flat, Depressed, Hopeless Orientation:  Oriented to Self, Oriented to Place, Oriented to  Time, Oriented to Situation Alcohol / Substance use:    No longer smokes, occasional alcohol consumption Psych involvement (Current and /or in the community):  No (Comment)  Discharge Needs  Concerns to be addressed:  Discharge Planning Concerns Readmission within the last 30 days:  No Current discharge risk:  None Barriers to Discharge:  Other (Currently Observation status. No 3 day appropriate hospitalization)   Williemae Area, LCSW [phone]

## 2015-01-28 NOTE — Progress Notes (Signed)
PT Cancellation Note  Patient Details Name: Garrett Turner MRN: [id number] DOB: Nov 25, 1950   Cancelled Treatment:    Reason Eval/Treat Not Completed: Patient at procedure or test/unavailable   Attempted twice to see pt this afternoon: 1) with Palliative Care physician, 2) eating dinner. We discussed briefly role of PT and continuing to work on activity tolerance and O2 requirements. He will be happy to work with PT 01/29/15.   Andrina Locken 01/28/2015, 5:15 PM  Pager [phone]

## 2015-01-29 ENCOUNTER — Ambulatory Visit (HOSPITAL_COMMUNITY): Payer: Medicare Other

## 2015-01-29 DIAGNOSIS — I370 Nonrheumatic pulmonary valve stenosis: Secondary | ICD-10-CM

## 2015-01-29 DIAGNOSIS — I361 Nonrheumatic tricuspid (valve) insufficiency: Secondary | ICD-10-CM

## 2015-01-29 LAB — CBC
HEMATOCRIT: 27.3 % — AB (ref 39.0–52.0)
HEMOGLOBIN: 9.4 g/dL — AB (ref 13.0–17.0)
MCH: 31 pg (ref 26.0–34.0)
MCHC: 34.4 g/dL (ref 30.0–36.0)
MCV: 90.1 fL (ref 78.0–100.0)
Platelets: 50 10*3/uL — ABNORMAL LOW (ref 150–400)
RBC: 3.03 MIL/uL — ABNORMAL LOW (ref 4.22–5.81)
RDW: 15.9 % — AB (ref 11.5–15.5)
WBC: 3.5 10*3/uL — AB (ref 4.0–10.5)

## 2015-01-29 LAB — GLUCOSE, CAPILLARY
GLUCOSE-CAPILLARY: 153 mg/dL — AB (ref 65–99)
Glucose-Capillary: 137 mg/dL — ABNORMAL HIGH (ref 65–99)
Glucose-Capillary: 186 mg/dL — ABNORMAL HIGH (ref 65–99)
Glucose-Capillary: 276 mg/dL — ABNORMAL HIGH (ref 65–99)

## 2015-01-29 LAB — BASIC METABOLIC PANEL
Anion gap: 6 (ref 5–15)
BUN: 16 mg/dL (ref 6–20)
CALCIUM: 8 mg/dL — AB (ref 8.9–10.3)
CO2: 24 mmol/L (ref 22–32)
CREATININE: 0.97 mg/dL (ref 0.61–1.24)
Chloride: 97 mmol/L — ABNORMAL LOW (ref 101–111)
GFR calc non Af Amer: >60 mL/min (ref >60)
GLUCOSE: 204 mg/dL — AB (ref 65–99)
Potassium: 4.7 mmol/L (ref 3.5–5.1)
Sodium: 127 mmol/L — ABNORMAL LOW (ref 135–145)

## 2015-01-29 MED ORDER — SPIRONOLACTONE 100 MG PO TABS
100.0000 mg | ORAL_TABLET | Freq: Every day | ORAL | Status: DC
  Administered 2015-01-30 – 2015-01-31 (×2): 100 mg via ORAL
  Filled 2015-01-29 (×2): qty 1

## 2015-01-29 MED ORDER — FUROSEMIDE 40 MG PO TABS
40.0000 mg | ORAL_TABLET | Freq: Every day | ORAL | Status: DC
  Administered 2015-01-30 – 2015-01-31 (×2): 40 mg via ORAL
  Filled 2015-01-29 (×2): qty 1

## 2015-01-29 NOTE — Progress Notes (Signed)
PT Cancellation Note  Patient Details Name: Garrett Turner MRN: [id number] DOB: [birth year]   Cancelled Treatment:    Reason Eval/Treat Not Completed: Patient at procedure or test/unavailable. Speaking with physician and asked PT to return later   Artisha Capri 01/29/2015, 9:32 AM Pager [phone]

## 2015-01-29 NOTE — Progress Notes (Signed)
Physical Therapy Treatment Patient Details Name: Garrett Turner MRN: [id number] DOB: July 30, 1950 Today's Date: 01/29/2015    History of Present Illness Patient is a 64 year old male with hypertension, hyperlipidemia, anxiety, type 2 diabetes mellitus, liver cirrhosis due to NASH presented to ED with shortness of breath, worsening over the last 10 days PTA. He states that he gets short of breath from walking from his bedroom to the bathroom or getting the food from the kitchen. He lives at home alone and uses cane or walker when ambulating. Pt found to have pulmonary effusion and underwent thoracentesis 01/25/15 with only 180 ml removed. CCM consulted as pt markedly desaturates whenever he sits upright and improves in supine. 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     PT Comments    Patient continues to be limited in mobility due to symptomatic hypoxia when upright (feels extremely weak, short of breath, and occasional chest tightness). Even with titration up to 6L O2, pt drops to 80% when static sitting. Long discussion re: discharge plans (pt recognizes he currently cannot care for himself at home--even with Hospice care). Informed pt that I would follow-up with CCM team and discuss if there are any limits to how much O2 we can use when attempting to mobilize and maintain O2 sats.   NOTE-Discussed after session with Garrett Housekeeper, PA-CCM. No restriction in using face mask and maximizing O2 to try to maintain O2 sats. Will attempt face mask (?venturi vs NRB) next visit.   Follow Up Recommendations  SNF;Supervision/Assistance - 24 hour     Equipment Recommendations  None recommended by PT    Recommendations for Other Services       Precautions / Restrictions Precautions Precautions: Fall Precaution Comments: DOE; decreased O2 sats with mobility Restrictions Weight Bearing Restrictions: No    Mobility  Bed Mobility Overal bed mobility: Modified Independent Bed Mobility: Sit to  Supine       Sit to supine: Modified independent (Device/Increase time)   General bed mobility comments: Encouraged to return to supine when unable to elevate O2 saturation while sitting EOB x 3 minutes  Transfers Overall transfer level: Needs assistance Equipment used: None Transfers: Stand Pivot Transfers   Stand pivot transfers: Min guard       General transfer comment: Returning from procedure via wheelchair and assisted pt wheelchair to EOB. Steady, good awareness of lines, foot rests, etc  Ambulation/Gait             General Gait Details: Unable to incr SaO2 on 6L; in supine returned to 95% on 4L   Stairs            Wheelchair Mobility    Modified Rankin (Stroke Patients Only)       Balance   Sitting-balance support: Feet supported;No upper extremity supported Sitting balance-Leahy Scale: Good     Standing balance support: No upper extremity supported Standing balance-Leahy Scale: Fair                      Cognition Arousal/Alertness: Awake/alert Behavior During Therapy: WFL for tasks assessed/performed Overall Cognitive Status: Within Functional Limits for tasks assessed                      Exercises      General Comments General comments (skin integrity, edema, etc.): On return to supine, pt required 4 minutes to incr from 80% SaO2 to 90% and then quickly incr to 94%. Garrett Turner, present and discussed discharge plans and  potential needs. Pt recognizes he currently cannot go home alone with Hospice as he cannot tolerate ANY upright activity (+symptomatic when desats). He is hesitant to go to unknown SNF and discussed asking friends re: local facilities vs having friends visit facilities and to inquire re: amount of therapy provided. (Garrett Turner stated she may go visit a few). Ultimately unclear how much O2 can be provided to try to keep his O2 saturation >88% and plan to follow-up with CCM for guidelines      Pertinent  Vitals/Pain Pain Assessment: No/denies pain    Home Living                      Prior Function            PT Goals (current goals can now be found in the care plan section) Acute Rehab PT Goals Patient Stated Goal: to get better  Time For Goal Achievement: 02/02/15 Progress towards PT goals: Not progressing toward goals - comment (continues to desaturate with min activity)    Frequency  Min 3X/week    PT Plan Current plan remains appropriate    Co-evaluation             End of Session Equipment Utilized During Treatment: Oxygen Activity Tolerance: Patient limited by fatigue;Treatment limited secondary to medical complications (Comment) (Decreased O2 sats) Patient left: in bed;with call bell/phone within reach;with family/visitor present     Time: 3709-6438 PT Time Calculation (min) (ACUTE ONLY): 26 min  Charges:  $Therapeutic Activity: 8-22 mins $Self Care/Home Management: 8-22                    G Codes:      Ritaj Dullea 2015/02/14, 5:12 PM Pager [phone]

## 2015-01-29 NOTE — Progress Notes (Signed)
Daily Progress Note   Patient Name: Garrett Turner       Date: 01/29/2015 DOB: 06-Jun-1951  Age: 64 y.o. MRN#: [id number] Attending Physician: Charlynne Cousins, MD Primary Care Physician: Lorayne Marek, MD Admit Date: 01/24/2015  Reason for Consultation/Follow-up: Establishing goals of care  Subjective: Feels pretty good but did have some left chest pain last night requiring dose of meds. Breathing is better on 4 l [place] O2 Interval Events: No new events of problems Length of Stay: 2 days  Current Medications: Scheduled Meds:   atorvastatin  20 mg Oral Daily   feeding supplement (ENSURE ENLIVE)  237 mL Oral TID BM   furosemide  40 mg Intravenous BID   insulin aspart  0-5 Units Subcutaneous QHS   insulin aspart  0-9 Units Subcutaneous TID WC   senna  1 tablet Oral Daily   sodium chloride  3 mL Intravenous Q12H   spironolactone  200 mg Oral Daily    Continuous Infusions:    PRN Meds: acetaminophen **OR** acetaminophen, alum & mag hydroxide-simeth, HYDROcodone-acetaminophen, HYDROmorphone (DILAUDID) injection, ondansetron **OR** ondansetron (ZOFRAN) IV, traMADol  Palliative Performance Scale: 60%     Vital Signs: BP 98/60 mmHg   Pulse 66   Temp(Src) 98.2 F (36.8 C) (Oral)   Resp 16   Ht 6\' 1"  (1.854 m)   Wt 66 kg (145 lb 8.1 oz)   BMI 19.20 kg/m2   SpO2 99% SpO2: SpO2: 99 % O2 Device: O2 Device: Nasal Cannula O2 Flow Rate: O2 Flow Rate (L/min): 4 L/min  Intake/output summary:  Intake/Output Summary (Last 24 hours) at 01/29/15 1021 Last data filed at 01/29/15 0502  Gross per 24 hour  Intake      3 ml  Output    650 ml  Net   -647 ml   LBM:   Baseline Weight: Weight: 67.586 kg (149 lb) Most recent weight: Weight: 66 kg (145 lb 8.1 oz)  Physical Exam: Gen'l - thin white man looking older than his stated age Cor- RRR Pulm - no increased WOB, no rales Neuro - awake and alert.              Additional Data Reviewed: Recent Labs     01/27/15  0312   01/28/15  0409  01/29/15  0346  WBC   --    --   3.5*  HGB   --    --   9.4*  PLT   --    --   50*  NA  132*  134*   --   BUN  12  12   --   CREATININE  1.05  0.94   --      Problem List:  Patient Active Problem List   Diagnosis Date Noted   Hepatopulmonary syndrome    Hypoxemia    Shortness of breath    Palliative care encounter    Pleural effusion 01/24/2015   Pleural effusion, left 11/19/2014   Acute respiratory failure with hypoxia 11/19/2014   Dyspnea 11/10/2014   Protein-calorie malnutrition, severe 10/06/2014   Sepsis 10/04/2014   HCAP (healthcare-associated pneumonia) 10/04/2014   Severe sepsis 10/04/2014   Adjustment disorder with mixed anxiety and depressed mood 08/21/2014   Insomnia 08/19/2014   CAP (community acquired pneumonia) 08/18/2014   Sinusitis, chronic 08/16/2014   Acute CVA (cerebrovascular accident) 08/15/2014   Edema    Acute pulmonary edema    Right sided weakness    Anemia 02/14/2014   Diabetes mellitus 04/24/2013  Left leg cellulitis 04/13/2012   Lactic acid acidosis 04/13/2012   Sepsis(995.91) 04/13/2012   GERD (gastroesophageal reflux disease) 04/13/2012   Cirrhosis of liver 04/13/2012   Thrombocytopenia 04/13/2012   Depression with anxiety 04/13/2012   Bilateral inguinal hernia-containing fat and ascites 09/11/2011   UTI (lower urinary tract infection) 06/16/2011   Intractable pain 06/14/2011   Fall 06/14/2011   DM (diabetes mellitus) 06/14/2011   HTN (hypertension) 06/14/2011   Hyperlipidemia 06/14/2011   Cirrhosis 06/14/2011     Palliative Care Assessment & Plan    Code Status:  DNR  Goals of Care:  Comfort care with limited additional interventions as needed. Disposition to be determined  Desire for further Chaplaincy support:no  3. Symptom Management:  No active issues  4. Palliative Prophylaxis:  Stool Softner: in place  5. Prognosis: > 6 - 12 months  5. Discharge  Planning: Home with Hospice   Care plan was discussed with Patient: he understands that hepato-pulmonary syndrome has no treatment and is progressive. He has had a marked decline over the past 6 months with accelerated decline in regard to respiratory function over the past month. The rate of decline is strongly suggestive of the anticipated rate of continued decline, thus a 6-12 month trajectory is highly probable. He will need increasing assistance over time but he wants to remain in his own home as long as possible. He does have a close friend who does check on him and assist him. Home with hospice seems to be a very workable solution with residential hospice should he dramatically decline.  Reviewed and completed MOST: DNR, for Limited Additional INterventions; antibiotics on case by case basis; no tube feeds, IVF for defined trial period. Copy of MOST for EMR done.  Thank you for allowing the Palliative Medicine Team to assist in the care of this patient.   Time In: 1021 Time Out: 1147 Total Time 30 min Prolonged Time Billed  no     Greater than 50%  of this time was spent counseling and coordinating care related to the above assessment and plan.   Adella Hare, MD, [place] Team  01/29/2015, 10:21 AM  Please contact Palliative Medicine Team phone at [phone] for questions and concerns.

## 2015-01-29 NOTE — Progress Notes (Signed)
Name: Garrett Turner MRN: [id number] DOB: 08-03-1950    ADMISSION DATE:  01/24/2015 CONSULTATION DATE: 01/28/2015  REFERRING MD : Tammi Klippel  CHIEF COMPLAINT:  Dyspnea  BRIEF PATIENT DESCRIPTION: 64 yo male with history of NASH cirrhosis admitted 7/7 with increased SOB over baseline. Primary team requested consult due to negative workup to date for explaination of symptomatology.   SIGNIFICANT EVENTS  7/7: Admitted to [facility] for SOB 7/8 Left thoracentesis performed by IR 135ml out  STUDIES:  7/7 CTA chest: No PE, small/mod L effusion, chornic peripheral interstitial lung disease. Cirrhotic changes including portal vein HTN, varices, ascites.  Echo 7/8: Left ventricle: The cavity size was normal. Wall thickness wasnormal. Systolic function was normal. The estimated ejection fraction was in the range of 60% to 65%. Wall motion was normal; there were no regional wall motion abnormalities. Left ventricular diastolic function parameters were normal. Bubble study 7/12 >>>  SUBJECTIVE: Breathing "stable" today. Complains of L chest pleuritic pain improved with PO medication.   VITAL SIGNS: Temp:  [98.2 F (36.8 C)-98.3 F (36.8 C)] 98.2 F (36.8 C) (07/12 0501) Pulse Rate:  [60-80] 66 (07/12 0501) Resp:  [16-18] 16 (07/12 0501) BP: (81-99)/(46-60) 98/60 mmHg (07/12 0509) SpO2:  [98 %-100 %] 99 % (07/12 0501)  PHYSICAL EXAMINATION: General:  Chronically ill appearing gentleman with wasting sitting up in bed in NAD Neuro:  Alert and oriented x3, CNII-XII grossly intact HEENT: no scleral icterus, PERRL 59mm Brisk Cardiovascular: S1&S2 audible, no rub, gallops, or murmurs Lungs: fine rales in right base, diminished in left base, otherwise clear Abdomen:  Soft, nontender, nondistened Musculoskeletal: grossly intact Skin:  Pale nailbeds without clubbing, Slight jaundice   Recent Labs Lab 01/26/15 1050 01/27/15 0312 01/28/15 0409  NA 131* 132* 134*  K 4.0 3.9 5.1  CL 102 104  103  CO2 23 23 25   BUN 15 12 12   CREATININE 0.96 1.05 0.94  GLUCOSE 258* 157* 144*    Recent Labs Lab 01/24/15 1352 01/25/15 0446 01/29/15 0346  HGB 10.0* 9.1* 9.4*  HCT 28.8* 25.9* 27.3*  WBC 4.6 3.3* 3.5*  PLT 69* 48* 50*   [place] Chest  01/28/2015   CLINICAL DATA:  Left pleural effusion  EXAM: CHEST ULTRASOUND  COMPARISON:  Chest radiograph March 28, 2015  FINDINGS: The left chest was interrogated by ultrasound. There is a minimal left pleural effusion. There is left base atelectasis.  IMPRESSION: Minimal left effusion and left base atelectasis.   Electronically Signed   By: Lowella Grip III M.D.   On: 01/28/2015 14:28   Dg Chest Port 1 View  01/28/2015   CLINICAL DATA:  Shortness of Breath  EXAM: PORTABLE CHEST - 1 VIEW  COMPARISON:  January 25, 2015 pain August 14, 2014  FINDINGS: There is underlying interstitial fibrosis, stable. There is no frank edema or consolidation. There is a questionable minimal left pleural effusion. Heart is upper normal in size with pulmonary vascularity within normal limits. No pneumothorax. No adenopathy. There is degenerative change in each shoulder.  IMPRESSION: Areas of interstitial fibrosis bilaterally, stable. No edema or consolidation. Question of minimal left pleural effusion. No change in cardiac silhouette.   Electronically Signed   By: Lowella Grip III M.D.   On: 01/28/2015 14:59    Assessment:  SOB: likely due to hepatopulmonary syndrome and deconditioning. Hypoxemia: - Supplemental O2.  - Target SpO2 88-92% - Failed exertional O2 sat assessment. Will need home O2 at discharge  Hepatopulmonary syndrome most likely. - Echo  with bubble study today 7/12 - Eventually liver transplant is the only viable treatment here. - Would consider testing quantity of nitric oxide in expired air, but that test is not available here.  - TIPS procedure unlikely to help  Physical  deconditioning: likely a contributory factor in SOB. - Physical therapy as ordered. - Ambulate. - Would benefit from cardiopulmonary rehab as outpatient. - Check NIF and vital capacity.  Pleural effusion: due to cirrhosis and associated ascites. - Thora done 7/8 in IR, unable to thora 7/11 due to no amenable windows on [place].  - Continue furosemide, spironolactone to maintain dry weight.   Liver failure: driver of this entire case. - Would recommend evaluation in a transplant center. - Avoid further liver insult.  Palliative: Currently DNR/DNI. Palliative following.   Will follow for results of bubble study.   Georgann Housekeeper, AGACNP-BC [facility] Pulmonology/Critical Care Pager [phone] or [phone]  01/29/2015 10:39 AM  Baltazar Apo, MD, PhD 01/29/2015, 11:56 AM [place] [facility] [phone] or if no answer [phone]

## 2015-01-29 NOTE — Progress Notes (Signed)
  Echocardiogram 2D Echocardiogram limited with bubbles has been performed.  Diamond Nickel 01/29/2015, 4:05 PM

## 2015-01-29 NOTE — Progress Notes (Signed)
CSW provided patient with bed offers.  Several facilities stated they were able to work with pt through financial concerns and help patient with Medicaid application.  CSW encouraged pt and friend Cenda to fill out application here in the hospital to get the process started- Cenda stated she would bring in lab top and work to fill out the Museum/gallery curator.  CSW will continue to follow for SNF vs home with home hospice placement.  Domenica Reamer, [place] Social Worker [phone]

## 2015-01-29 NOTE — Progress Notes (Signed)
[facility] PROGRESS NOTE Assessment/Plan: Dyspnea/ Pleural effusion, left - BNP 56 Echo showed an EF of 60%.  - Left pleural effusion Likely from liver cirrhosis. - CT of the chest showed no PE or no pneumonia does show left-sided pleural effusion. Thoracocentesis revealed minimal pleural fluid. - consulted PMT poor prognosis. - Consulted pulmonary critical care they recommended a bubble study.  - Pulmonary is concerned about hepatopulmonary syndrome, appreciate pulmonary's assistance.  Protein-calorie malnutrition, severe - Ensure TID    Normocytic Anemia - unclear will have to follow up with PCP as an outpatient.  Controlled Diabetes mellitus without complication: - Improved control.  NASH/Cirrhosis of liver/thrombocytopenia/hypoalbumenemia: - Cont aldactone and lasix. - monitor lytes.  Hyperlipidemia  Code Status: DNR/DNI Family Communication: none  Disposition Plan: inpatient home in 2-3 days  Consultants:  none  Procedures:  CT angio  Antibiotics:  None  HPI/Subjective: SOB unchanged.  Objective: Filed Vitals:   01/28/15 1531 01/28/15 1941 01/29/15 0501 01/29/15 0509  BP: 99/58 92/57 81/46  98/60  Pulse: 65 80 66   Temp:  98.3 F (36.8 C) 98.2 F (36.8 C)   TempSrc:  Oral Oral   Resp: 18 16 16    Height:      Weight:      SpO2: 98% 99% 99%     Intake/Output Summary (Last 24 hours) at 01/29/15 0921 Last data filed at 01/29/15 0502  Gross per 24 hour  Intake      3 ml  Output    650 ml  Net   -647 ml   Filed Weights   01/24/15 1347 01/24/15 2127  Weight: 67.586 kg (149 lb) 66 kg (145 lb 8.1 oz)    Exam:  General: Alert, awake, oriented x3, in no acute distress.  HEENT: No bruits, no goiter.  Heart: Regular rate and rhythm. Lungs: Good air movement, clear Abdomen: Soft, nontender, nondistended, positive bowel sounds.  Neuro: Grossly intact, nonfocal.   Data Reviewed: Basic Metabolic Panel:  Recent Labs Lab  01/24/15 1352 01/25/15 0446 01/26/15 1050 01/27/15 0312 01/28/15 0409  NA 132* 132* 131* 132* 134*  K 3.9 3.5 4.0 3.9 5.1  CL 102 102 102 104 103  CO2 18* 24 23 23 25   GLUCOSE 140* 117* 258* 157* 144*  BUN 24* 22* 15 12 12   CREATININE 1.24 1.15 0.96 1.05 0.94  CALCIUM 8.9 8.4* 8.6* 8.2* 8.4*   Liver Function Tests:  Recent Labs Lab 01/24/15 1352  AST 46*  ALT 26  ALKPHOS 88  BILITOT 2.4*  PROT 7.5  ALBUMIN 2.5*   No results for input(s): LIPASE, AMYLASE in the last 168 hours. No results for input(s): AMMONIA in the last 168 hours. CBC:  Recent Labs Lab 01/24/15 1352 01/25/15 0446 01/29/15 0346  WBC 4.6 3.3* 3.5*  HGB 10.0* 9.1* 9.4*  HCT 28.8* 25.9* 27.3*  MCV 90.9 90.9 90.1  PLT 69* 48* 50*   Cardiac Enzymes:  Recent Labs Lab 01/24/15 1148 01/24/15 1352 01/24/15 1620 01/25/15 0446 01/25/15 1105  TROPONINI <0.03 <0.03 <0.03 <0.03 <0.03   BNP (last 3 results)  Recent Labs  10/04/14 1125 11/19/14 1310 01/24/15 1352  BNP 65.8 77.4 56.2    ProBNP (last 3 results) No results for input(s): PROBNP in the last 8760 hours.  CBG:  Recent Labs Lab 01/28/15 0631 01/28/15 1112 01/28/15 1640 01/28/15 2053 01/29/15 0625  GLUCAP 135* 158* 136* 297* 137*    Recent Results (from the past 240 hour(s))  Urine culture  Status: None   Collection Time: 01/24/15 11:26 PM  Result Value Ref Range Status   Specimen Description URINE, CLEAN CATCH  Final   Special Requests NONE  Final   Culture 4,000 COLONIES/mL INSIGNIFICANT GROWTH  Final   Report Status 01/26/2015 FINAL  Final  Culture, body fluid-bottle     Status: None (Preliminary result)   Collection Time: 01/25/15 10:23 AM  Result Value Ref Range Status   Specimen Description FLUID LEFT PLEURAL  Final   Special Requests NONE  Final   Culture NO GROWTH 3 DAYS  Final   Report Status PENDING  Incomplete  Gram stain     Status: None   Collection Time: 01/25/15 10:23 AM  Result Value Ref Range  Status   Specimen Description FLUID LEFT PLEURAL  Final   Special Requests NONE  Final   Gram Stain   Final    ABUNDANT WBC PRESENT, PREDOMINANTLY MONONUCLEAR NO ORGANISMS SEEN    Report Status 01/25/2015 FINAL  Final     Studies: [place] Chest  01/28/2015   CLINICAL DATA:  Left pleural effusion  EXAM: CHEST ULTRASOUND  COMPARISON:  Chest radiograph March 28, 2015  FINDINGS: The left chest was interrogated by ultrasound. There is a minimal left pleural effusion. There is left base atelectasis.  IMPRESSION: Minimal left effusion and left base atelectasis.   Electronically Signed   By: Lowella Grip III M.D.   On: 01/28/2015 14:28   Dg Chest Port 1 View  01/28/2015   CLINICAL DATA:  Shortness of Breath  EXAM: PORTABLE CHEST - 1 VIEW  COMPARISON:  January 25, 2015 pain August 14, 2014  FINDINGS: There is underlying interstitial fibrosis, stable. There is no frank edema or consolidation. There is a questionable minimal left pleural effusion. Heart is upper normal in size with pulmonary vascularity within normal limits. No pneumothorax. No adenopathy. There is degenerative change in each shoulder.  IMPRESSION: Areas of interstitial fibrosis bilaterally, stable. No edema or consolidation. Question of minimal left pleural effusion. No change in cardiac silhouette.   Electronically Signed   By: Lowella Grip III M.D.   On: 01/28/2015 14:59    Scheduled Meds: . atorvastatin  20 mg Oral Daily  . feeding supplement (ENSURE ENLIVE)  237 mL Oral TID BM  . furosemide  40 mg Intravenous BID  . heparin subcutaneous  5,000 Units Subcutaneous 3 times per day  . insulin aspart  0-5 Units Subcutaneous QHS  . insulin aspart  0-9 Units Subcutaneous TID WC  . senna  1 tablet Oral Daily  . sodium chloride  3 mL Intravenous Q12H  . spironolactone  200 mg Oral Daily   Continuous Infusions:   Time Spent: 25 min   Garrett Turner  [facility] Pager [phone]. If 7PM-7AM, please contact  night-coverage at [email or web address], password [facility] 01/29/2015, 9:21 AM  LOS: 2 days

## 2015-01-29 NOTE — Care Management Important Message (Signed)
Important Message  Patient Details  Name: Garrett Turner MRN: [id number] Date of Birth: 1951/07/17   Medicare Important Message Given:  [facility] notification given    Nathen May 01/29/2015, 12:36 [place] Message  Patient Details  Name: Garrett Turner MRN: [id number] Date of Birth: [birth year]   Medicare Important Message Given:  Yes-second notification given    Nathen May 01/29/2015, 12:36 PM

## 2015-01-30 DIAGNOSIS — D61818 Other pancytopenia: Secondary | ICD-10-CM | POA: Insufficient documentation

## 2015-01-30 DIAGNOSIS — K7460 Unspecified cirrhosis of liver: Secondary | ICD-10-CM

## 2015-01-30 DIAGNOSIS — R0902 Hypoxemia: Secondary | ICD-10-CM

## 2015-01-30 DIAGNOSIS — K7681 Hepatopulmonary syndrome: Secondary | ICD-10-CM

## 2015-01-30 LAB — GLUCOSE, CAPILLARY
GLUCOSE-CAPILLARY: 240 mg/dL — AB (ref 65–99)
GLUCOSE-CAPILLARY: 273 mg/dL — AB (ref 65–99)
Glucose-Capillary: 147 mg/dL — ABNORMAL HIGH (ref 65–99)
Glucose-Capillary: 183 mg/dL — ABNORMAL HIGH (ref 65–99)

## 2015-01-30 LAB — BASIC METABOLIC PANEL
Anion gap: 4 — ABNORMAL LOW (ref 5–15)
BUN: 17 mg/dL (ref 6–20)
CHLORIDE: 100 mmol/L — AB (ref 101–111)
CO2: 28 mmol/L (ref 22–32)
Calcium: 8.2 mg/dL — ABNORMAL LOW (ref 8.9–10.3)
Creatinine, Ser: 0.98 mg/dL (ref 0.61–1.24)
GFR calc Af Amer: >60 mL/min (ref >60)
GLUCOSE: 166 mg/dL — AB (ref 65–99)
Potassium: 4.6 mmol/L (ref 3.5–5.1)
Sodium: 132 mmol/L — ABNORMAL LOW (ref 135–145)

## 2015-01-30 LAB — CULTURE, BODY FLUID W GRAM STAIN -BOTTLE

## 2015-01-30 LAB — CULTURE, BODY FLUID-BOTTLE: Culture: NO GROWTH

## 2015-01-30 NOTE — Progress Notes (Signed)
Chaplain Note:   Chaplain paged to visit with pt concerning HCPOA.   Pt was under the impression he couldn't fill it out until Chaplain visited. Chaplain clarified the only portion of the document not to be signed was the final page with the notary line.   Pt and friend presented as very conversational and pleasant.   Pt and friend will fill out the form together and Chaplain will visit between 8:30 and 9:00am to finish the document.   Sherrilee Gilles 01/30/2015 4:47 PM

## 2015-01-30 NOTE — Progress Notes (Signed)
[facility] PROGRESS NOTE  PCP: Dr. Lorayne Marek  Interim summary 64 year old male with history of HTN, HLD, anxiety, DM 2, liver cirrhosis due to [facility], admitted to [facility] on 01/24/15 with progressive dyspnea of 10 days duration. He has chronic dyspnea which worsened to dyspnea with minimal exertion. He lives alone and ambulates with a cane or a walker. In the ED, oxygen saturation 98% on room air but dropped to 85% with activity. Chest x-ray negative for pneumonia. CTA chest negative for PE, small to moderate-sized left pleural effusion, changes of liver cirrhosis with portal hypertension, varices and ascites.  Assessment/Plan: Dyspnea/hypoxia/ Pleural effusion, left - BNP 56. Echo showed an EF of 60%.  - Left pleural effusion likely from liver cirrhosis. - CT of the chest showed no PE or no pneumonia does show left-sided pleural effusion. Thoracocentesis revealed minimal pleural fluid (180 mL of bloody fluid). Unable to perform thoracentesis on 7/11 due to no amenable windows on ultrasound - consulted PMT poor prognosis. - Consulted pulmonary critical care who recommended a bubble study that was positive. - Dyspnea/hypoxia secondary to hepatopulmonary syndrome and deconditioning - Pulmonology follow-up appreciated: Supplemental oxygen, target oxygen saturation: 88-92 percent, will need home oxygen at discharge, we will likely have to accept a low saturation with activity as long as work of breathing is acceptable to the patient. - Liver transplant is the only viable treatment but he does not want to pursue and likely not a candidate. Tipps procedure unlikely to help. - Would benefit from cardiopulmonary rehabilitation as outpatient. - Patient will go to SNF and may have to transition to comfort/hospice care at some point  Protein-calorie malnutrition, severe - Ensure TID    Pancytopenia - Possibly related to cirrhosis. Stable  Controlled Diabetes mellitus without complication: -  Improved control.  NASH/Cirrhosis of liver/thrombocytopenia/hypoalbumenemia: - Cont aldactone and lasix. - monitor lytes.  Hyperlipidemia  Deconditioning  Hyponatremia - Related to chronic liver disease? SIADH  Failure to thrive   DVT prophylaxis: SCDs Code Status: DNR/DNI Family Communication: Discussed with patient's friend Ms. Senda at bedside on 7/13 Disposition Plan: Possible DC to SNF on 7/14  Consultants:  Pulmonology  Palliative care medicine  Procedures:  Echo bubble study 01/29/15: Study Conclusions  - Left ventricle: The cavity size was normal. Wall thickness was normal. Systolic function was normal. The estimated ejection fraction was in the range of 60% to 65%. - Aortic valve: Calcified left coronary cusp no stenosis - Impressions: Marked right to left shunting after 6-7 cardiac beats suggesting pulmonary shunting.  Impressions:  - Marked right to left shunting after 6-7 cardiac beats suggesting pulmonary shunting.  Thoracentesis by IR on 01/25/15: Yielded 180 mL of bloody fluid.  Antibiotics:  None  HPI/Subjective: Still with dyspnea with exertion. No chest pain or cough.  Objective: Filed Vitals:   01/29/15 2046 01/30/15 0628 01/30/15 0646 01/30/15 1500  BP: 103/57 84/40 98/62  87/50  Pulse: 83 69  80  Temp: 99.1 F (37.3 C) 98.3 F (36.8 C)  98.7 F (37.1 C)  TempSrc: Oral Oral  Oral  Resp: 17 18  18   Height:      Weight:      SpO2: 98% 99%  98%    Intake/Output Summary (Last 24 hours) at 01/30/15 1818 Last data filed at 01/30/15 1700  Gross per 24 hour  Intake    360 ml  Output    900 ml  Net   -540 ml   Filed Weights   01/24/15 1347 01/24/15  2127  Weight: 67.586 kg (149 lb) 66 kg (145 lb 8.1 oz)    Exam:  General: Pleasant middle-aged frail patient lying comfortably propped up in bed Heart: S1 and S2 heard, RRR. No JVD, murmurs or pedal edema. Telemetry: Sinus rhythm. Lungs: Reduced breath sounds bilaterally  without wheezing, rhonchi or crackles. No increased work of breathing. Abdomen: Soft, nontender, nondistended, positive bowel sounds.  Neuro: Grossly intact, nonfocal. Alert and oriented 3.   Data Reviewed: Basic Metabolic Panel:  Recent Labs Lab 01/26/15 1050 01/27/15 0312 01/28/15 0409 01/29/15 1015 01/30/15 0351  NA 131* 132* 134* 127* 132*  K 4.0 3.9 5.1 4.7 4.6  CL 102 104 103 97* 100*  CO2 23 23 25 24 28   GLUCOSE 258* 157* 144* 204* 166*  BUN 15 12 12 16 17   CREATININE 0.96 1.05 0.94 0.97 0.98  CALCIUM 8.6* 8.2* 8.4* 8.0* 8.2*   Liver Function Tests:  Recent Labs Lab 01/24/15 1352  AST 46*  ALT 26  ALKPHOS 88  BILITOT 2.4*  PROT 7.5  ALBUMIN 2.5*   No results for input(s): LIPASE, AMYLASE in the last 168 hours. No results for input(s): AMMONIA in the last 168 hours. CBC:  Recent Labs Lab 01/24/15 1352 01/25/15 0446 01/29/15 0346  WBC 4.6 3.3* 3.5*  HGB 10.0* 9.1* 9.4*  HCT 28.8* 25.9* 27.3*  MCV 90.9 90.9 90.1  PLT 69* 48* 50*   Cardiac Enzymes:  Recent Labs Lab 01/24/15 1148 01/24/15 1352 01/24/15 1620 01/25/15 0446 01/25/15 1105  TROPONINI <0.03 <0.03 <0.03 <0.03 <0.03   BNP (last 3 results)  Recent Labs  10/04/14 1125 11/19/14 1310 01/24/15 1352  BNP 65.8 77.4 56.2    ProBNP (last 3 results) No results for input(s): PROBNP in the last 8760 hours.  CBG:  Recent Labs Lab 01/29/15 1642 01/29/15 2100 01/30/15 0625 01/30/15 1122 01/30/15 1651  GLUCAP 153* 276* 147* 240* 183*    Recent Results (from the past 240 hour(s))  Urine culture     Status: None   Collection Time: 01/24/15 11:26 PM  Result Value Ref Range Status   Specimen Description URINE, CLEAN CATCH  Final   Special Requests NONE  Final   Culture 4,000 COLONIES/mL INSIGNIFICANT GROWTH  Final   Report Status 01/26/2015 FINAL  Final  Culture, body fluid-bottle     Status: None   Collection Time: 01/25/15 10:23 AM  Result Value Ref Range Status   Specimen  Description FLUID LEFT PLEURAL  Final   Special Requests NONE  Final   Culture NO GROWTH 5 DAYS  Final   Report Status 01/30/2015 FINAL  Final  Gram stain     Status: None   Collection Time: 01/25/15 10:23 AM  Result Value Ref Range Status   Specimen Description FLUID LEFT PLEURAL  Final   Special Requests NONE  Final   Gram Stain   Final    ABUNDANT WBC PRESENT, PREDOMINANTLY MONONUCLEAR NO ORGANISMS SEEN    Report Status 01/25/2015 FINAL  Final     Studies: No results found.  Scheduled Meds: . atorvastatin  20 mg Oral Daily  . feeding supplement (ENSURE ENLIVE)  237 mL Oral TID BM  . furosemide  40 mg Oral Daily  . insulin aspart  0-5 Units Subcutaneous QHS  . insulin aspart  0-9 Units Subcutaneous TID WC  . senna  1 tablet Oral Daily  . sodium chloride  3 mL Intravenous Q12H  . spironolactone  100 mg Oral Daily   Continuous Infusions:  Time Spent: 84 min    Garrett Sciulli, MD, FACP, FHM. [facility] Pager [phone]  If 7PM-7AM, please contact night-coverage [email or web address] Password [email or web address] 01/30/2015, 6:18 PM   LOS: 3 days

## 2015-01-30 NOTE — Progress Notes (Signed)
Pt chooses [facility]- CSW informed facility of pt choice- have not confirmed bed availability.  CSW will continue to follow- anticipate DC tomorrow.  Domenica Reamer, [place] Social Worker [phone]

## 2015-01-30 NOTE — Progress Notes (Signed)
Name: Garrett Turner MRN: [id number] DOB: 07/04/1951    ADMISSION DATE:  01/24/2015 CONSULTATION DATE: 01/28/2015  REFERRING MD : Tammi Klippel  CHIEF COMPLAINT:  Dyspnea  BRIEF PATIENT DESCRIPTION: 64 yo male with history of NASH cirrhosis admitted 7/7 with increased SOB over baseline. Primary team requested consult due to negative workup to date for explaination of symptomatology.   SIGNIFICANT EVENTS  7/07  Admitted to [facility] for SOB 7/08  Left thoracentesis performed by IR 130ml out  STUDIES:  7/07  CTA chest >> No PE, small/mod L effusion, chornic peripheral interstitial lung disease. Cirrhotic changes including portal vein HTN, varices, ascites.  7/08  Echo >> Left ventricle: The cavity size was normal. Wall thickness wasnormal. Systolic function was normal. The estimated ejection fraction was in the range of 60% to 65%. Wall motion was normal; there were no regional wall motion abnormalities. Left ventricular diastolic function parameters were normal. 7/12  Bubble study >> marked R to L shunting after 6-7 cardiac beats suggesting pulmonary shunting    SUBJECTIVE:  Pt reports dyspnea on exertion and drop in sats.  Denies pain/SOB at rest.  Expresses concerns regarding rehab placement (wants to go back to [facility]).  Also, concerned about end of life issues - worries about being SOB or in pain.    VITAL SIGNS: Temp:  [98.2 F (36.8 C)-99.1 F (37.3 C)] 98.3 F (36.8 C) (07/13 0628) Pulse Rate:  [69-83] 69 (07/13 0628) Resp:  [17-20] 18 (07/13 0628) BP: (84-103)/(40-62) 98/62 mmHg (07/13 0646) SpO2:  [98 %-99 %] 99 % (07/13 0628)  PHYSICAL EXAMINATION: General:  Chronically ill appearing gentleman, cachetic sitting up in bed in NAD Neuro:  Alert and oriented x3, CNII-XII grossly intact HEENT: no scleral icterus, PERRL 68mm Brisk Cardiovascular: S1&S2 audible, no rub, gallops, or murmurs Lungs: fine rales in right base, diminished in left base, otherwise clear Abdomen:   Soft, nontender, nondistened Musculoskeletal: grossly intact Skin:  Pale nailbeds without clubbing, Slight jaundice   Recent Labs Lab 01/28/15 0409 01/29/15 1015 01/30/15 0351  NA 134* 127* 132*  K 5.1 4.7 4.6  CL 103 97* 100*  CO2 25 24 28   BUN 12 16 17   CREATININE 0.94 0.97 0.98  GLUCOSE 144* 204* 166*    Recent Labs Lab 01/24/15 1352 01/25/15 0446 01/29/15 0346  HGB 10.0* 9.1* 9.4*  HCT 28.8* 25.9* 27.3*  WBC 4.6 3.3* 3.5*  PLT 69* 48* 50*   [place] Chest  01/28/2015   CLINICAL DATA:  Left pleural effusion  EXAM: CHEST ULTRASOUND  COMPARISON:  Chest radiograph March 28, 2015  FINDINGS: The left chest was interrogated by ultrasound. There is a minimal left pleural effusion. There is left base atelectasis.  IMPRESSION: Minimal left effusion and left base atelectasis.   Electronically Signed   By: Lowella Grip III M.D.   On: 01/28/2015 14:28   Dg Chest Port 1 View  01/28/2015   CLINICAL DATA:  Shortness of Breath  EXAM: PORTABLE CHEST - 1 VIEW  COMPARISON:  January 25, 2015 pain August 14, 2014  FINDINGS: There is underlying interstitial fibrosis, stable. There is no frank edema or consolidation. There is a questionable minimal left pleural effusion. Heart is upper normal in size with pulmonary vascularity within normal limits. No pneumothorax. No adenopathy. There is degenerative change in each shoulder.  IMPRESSION: Areas of interstitial fibrosis bilaterally, stable. No edema or consolidation. Question of minimal left pleural effusion. No change in cardiac silhouette.   Electronically Signed  By: Lowella Grip III M.D.   On: 01/28/2015 14:59    Assessment:  SOB: secondary to hepatopulmonary syndrome and deconditioning. Hypoxemia: - Supplemental O2.  - Target SpO2 88-92%.  Consider oxymizer prior to discharge ?  - Will likely have to accept a lower saturation with activity as long as work of breathing is acceptable to the patient - Failed  exertional O2 sat assessment. Will need home O2 at discharge  Hepatopulmonary syndrome. - Echo with bubble study 7/12 confirms marked R to L shunt suggestive of pulmonary shunting - Liver transplant is the only viable treatment here but he does not want to pursue process and is not a likely candidate  - Could consider testing quantity of nitric oxide in expired air, but that test is not available here.  - TIPS procedure unlikely to help  Physical deconditioning: likely a contributory factor in SOB. - Physical therapy as ordered. - Ambulate. - Would benefit from cardiopulmonary rehab as outpatient.  Pleural effusion: due to cirrhosis and associated ascites. - Thora done 7/8 in IR, unable to thora 7/11 due to no amenable windows on [place].  - Continue furosemide, spironolactone to maintain dry weight.   Liver failure: driver of this entire case. - Would recommend evaluation in a transplant center but patient does not want to pursue at this time - Avoid further liver insult.  Palliative: Currently DNR/DNI. Palliative following.    GLOBAL:  Anticipate SNF discharge with O2.  He will need hospice involvement at some point for symptom management.    Noe Gens, NP-[facility] Pgr: [phone] or if no answer [phone] 01/30/2015, 9:37 AM  Attending Note:  I have examined patient, reviewed labs, studies and notes. I have discussed the case with B Ollis, and I agree with the data and plans as amended above. Positive shunt on bubble study noted. Unfortunately he will remain difficult to oxygenate, even on high fio2. Will need to transition to comfort care. Please call if we can assist you.   Baltazar Apo, MD, PhD 01/30/2015, 1:20 PM [facility] [phone] or if no answer [phone]

## 2015-01-31 ENCOUNTER — Ambulatory Visit: Payer: Self-pay | Admitting: Internal Medicine

## 2015-01-31 LAB — GLUCOSE, CAPILLARY
GLUCOSE-CAPILLARY: 136 mg/dL — AB (ref 65–99)
GLUCOSE-CAPILLARY: 166 mg/dL — AB (ref 65–99)

## 2015-01-31 MED ORDER — FUROSEMIDE 40 MG PO TABS: 40.0000 mg | ORAL_TABLET | Freq: Every day | ORAL | Status: AC

## 2015-01-31 MED ORDER — TRAMADOL HCL 50 MG PO TABS: 50.0000 mg | ORAL_TABLET | Freq: Four times a day (QID) | ORAL | Status: AC | PRN

## 2015-01-31 MED ORDER — ENSURE ENLIVE PO LIQD: 237.0000 mL | Freq: Three times a day (TID) | ORAL | Status: AC

## 2015-01-31 MED ORDER — SPIRONOLACTONE 100 MG PO TABS: 100.0000 mg | ORAL_TABLET | Freq: Every day | ORAL | Status: AC

## 2015-01-31 NOTE — Care Management Note (Addendum)
Case Management Note  Patient Details  Name: Garrett Turner MRN: [id number] Date of Birth: 1951-02-21  Subjective/Objective:   Pt admitted with dyspnea                 Action/Plan:  Pt is already active with [facility] for HHPT and RN.  Pt is from home with wife.  CM will monitor pt for disposition plan   Expected Discharge Date:                  Expected Discharge Plan:  [place]  In-House Referral:     Discharge planning Services  CM Consult  Post Acute Care Choice:    Choice offered to:  Patient  DME Arranged:   DME Agency:    HH Arranged:   [place] Agency:    Status of Service:  Complete, will sign off  Medicare Important Message Given:  Yes-second notification given Date Medicare IM Given:    Medicare IM give by:    Date Additional Medicare IM Given:    Additional Medicare Important Message give by:     If discussed at [place] of Stay Meetings, dates discussed:  01/31/15  Additional Comments: 01/31/15 Elenor Quinones, RN, BSN  [phone] [facility] made aware that pt will discharge to SNF, agency will retrieve O2 tank from room.  [facility] contacted and informed of pending discharge to SNF.  Pt will discharge to SNF today.  No other CM needs  01/30/15 Elenor Quinones, RN, BSN [phone] Diagnosis has been confirmed.  CM spoke with pt; pt stated he is afraid to go home in current condition, pt will discharge to SNF.  01/29/15 Elenor Quinones, RN, BSN [phone] CM sat and talked with pt regarding possible hospice at home.  Pt remains very deconditioned and stated he wanted to defer decision of hospice at home agency until pending diagnosis is confirmed .  01/25/15 Elenor Quinones, RN, BSN [phone] CM confirmed with [facility] that oxygen will be supplied for pt prior and post discharge.  CM assessed pt; pt experiencing SOB during conversation.  Pt stated he has been waiting on oxygen for home for at least a month, CM unable to  find previous home 02 order.  Pt experienced desat 85% in an attempt to ambulate in ED.  MD contacted, home oxygen order written.  [facility] contacted, referral accepted.  Bedside nurse contacted, CM requested pulmonary ambulation documentation.  CM requested resumption order for [facility] to resume HHRN and PT, [facility] made aware of admit.  Maryclare Labrador, RN 01/31/2015, 12:01 PM

## 2015-01-31 NOTE — Progress Notes (Signed)
Patient will discharge to [facility] Anticipated discharge date:01/31/15 Family notified:Cenda (friend) at bedside Transportation by [employer]- called at [place] signing off.  Domenica Reamer, [place] Social Worker [phone]

## 2015-01-31 NOTE — Clinical Social Work Placement (Signed)
   CLINICAL SOCIAL WORK PLACEMENT  NOTE  Date:  01/31/2015  Patient Details  Name: Garrett Turner MRN: [id number] Date of Birth: Aug 03, 1950  Clinical Social Work is seeking post-discharge placement for this patient at the [place] level of care (*CSW will initial, date and re-position this form in  chart as items are completed):  Yes   Patient/family provided with [place] Work Department's list of facilities offering this level of care within the geographic area requested by the patient (or if unable, by the patient's family).  Yes   Patient/family informed of their freedom to choose among providers that offer the needed level of care, that participate in Medicare, Medicaid or managed care program needed by the patient, have an available bed and are willing to accept the patient.  Yes   Patient/family informed of [place]'s ownership interest in [facility] and [facility], as well as of the fact that they are under no obligation to receive care at these facilities.  PASRR submitted to EDS on       PASRR number received on       Existing PASRR number confirmed on 01/29/15     FL2 transmitted to all facilities in geographic area requested by pt/family on 01/29/15     FL2 transmitted to all facilities within larger geographic area on       Patient informed that his/her managed care company has contracts with or will negotiate with certain facilities, including the following:        Yes   Patient/family informed of bed offers received.  Patient chooses bed at [facility]     Physician recommends and patient chooses bed at      Patient to be transferred to [facility] on 01/31/15.  Patient to be transferred to facility by ptar     Patient family notified on 01/31/15 of transfer.  Name of family member notified:  Cenda     PHYSICIAN       Additional Comment:     _______________________________________________ [place] Mon, LCSW 01/31/2015, 1:16 PM

## 2015-01-31 NOTE — Discharge Summary (Signed)
Physician Discharge Summary  Reford Olliff OIB:[id number] DOB: 1951/03/08 DOA: 01/24/2015  PCP: Lorayne Marek, MD  Admit date: 01/24/2015 Discharge date: 01/31/2015  Time spent: Greater than 30 minutes  Recommendations for Outpatient Follow-up:  1. MD at SNF in 2-3 days from hospital discharge. Repeat labs (CBC & CMP) in 1 week. 2. Recommend Palliative Care follow up at SNF. He will eventually need hospice care. 3. Oxygen via nasal cannula at 4 L/min with oxymizer. Target SpO2 88-92%. 4. Dr. Lorayne Marek, PCP upon DC from SNF. 5. Recommend Cardiopulmonary rehab at SNF.  Discharge Diagnoses:  Principal Problem:   Dyspnea Active Problems:   Hyperlipidemia   Cirrhosis of liver   Diabetes mellitus   Anemia   Protein-calorie malnutrition, severe   Pleural effusion, left   Pleural effusion   Hepatopulmonary syndrome   Hypoxemia   Shortness of breath   Palliative care encounter   Other pancytopenia   Discharge Condition: Improved & Stable  Diet recommendation: Heart Healthy & Diabetic diet.  Filed Weights   01/24/15 1347 01/24/15 2127  Weight: 67.586 kg (149 lb) 66 kg (145 lb 8.1 oz)    History of present illness:  64 year old male with history of HTN, HLD, anxiety, DM 2, liver cirrhosis due to [facility], admitted to [facility] on 01/24/15 with progressive dyspnea of 10 days duration. He has chronic dyspnea which worsened to dyspnea with minimal exertion. He lives alone and ambulates with a cane or a walker. In the ED, oxygen saturation 98% on room air but dropped to 85% with activity. Chest x-ray negative for pneumonia. CTA chest negative for PE, small to moderate-sized left pleural effusion, changes of liver cirrhosis with portal hypertension, varices and ascites.  Hospital Course:   Dyspnea & hypoxia secondary to Hepatorenal syndrome & deconditioning/ Pleural effusion, left - BNP 56. Echo showed an EF of 60%.  - Left pleural effusion likely from liver cirrhosis. - CT of the chest  showed no PE or pneumonia but did show left-sided pleural effusion. Thoracocentesis revealed minimal pleural fluid (180 mL of bloody fluid). Unable to perform repeat thoracentesis on 7/11 due to no amenable windows on ultrasound - consulted PMT due to poor prognosis. - Consulted pulmonary critical care who recommended a bubble study that was positive for marked L>R shunt. - Pulmonology follow-up appreciated: Supplemental oxygen, target oxygen saturation: 88-92 percent, will need home oxygen at discharge, will likely have to accept a low saturation with activity as long as work of breathing is acceptable to the patient. Unfortunately he will remain difficult to oxygenate, even on high fio2. Will need to transition to comfort care. - Liver transplant is the only viable treatment but he does not want to pursue and likely not a candidate. TIPS procedure unlikely to help. - Would benefit from cardiopulmonary rehabilitation as outpatient. - Patient will go to SNF and may have to transition to comfort/hospice care at some point  Protein-calorie malnutrition, severe - Ensure TID   Pancytopenia - Possibly related to cirrhosis. Stable  Controlled Diabetes mellitus without complication: - Fluctuating and mildly controlled. Continue Metformin at DC- may consider switching to SSI at SNF Vs diet alone. - A1C 6.3  NASH/Cirrhosis of liver/thrombocytopenia/hypoalbumenemia: - Cont aldactone and lasix. - monitor lytes periodically. - Patient was on Propranolol prior to admission (likely for Portal HTN) which was held due to soft BP's. Follow up at SNF and consider resuming when BP improves  Hyperlipidemia  Deconditioning - Therapies at SNF  Hyponatremia - Related to chronic liver disease? SIADH  Failure to thrive - mostly from Cirrhosis and dyspnea  DO NOT RESSUSITATE     Consultants:  Pulmonology  Palliative care medicine  Procedures:  Echo bubble study 01/29/15: Study Conclusions  -  Left ventricle: The cavity size was normal. Wall thickness was normal. Systolic function was normal. The estimated ejection fraction was in the range of 60% to 65%. - Aortic valve: Calcified left coronary cusp no stenosis - Impressions: Marked right to left shunting after 6-7 cardiac beats suggesting pulmonary shunting.  Impressions:  - Marked right to left shunting after 6-7 cardiac beats suggesting pulmonary shunting.  Thoracentesis by IR on 01/25/15: Yielded 180 mL of bloody fluid.  Discharge Exam:  Complaints: Intermittent dyspnea. At times can walk hall without distress and at other times, same activity causes SOB.  Filed Vitals:   01/30/15 0646 01/30/15 1500 01/30/15 2158 01/31/15 0623  BP: 98/62 87/50 104/61 105/63  Pulse:  80 79 75  Temp:  98.7 F (37.1 C) 98.6 F (37 C) 97.9 F (36.6 C)  TempSrc:  Oral Oral Oral  Resp:  18 18 16   Height:      Weight:      SpO2:  98% 99% 98%    General: Pleasant middle-aged frail patient lying comfortably propped up in bed. Heart: S1 and S2 heard, RRR. No JVD, murmurs or pedal edema. Telemetry: Sinus rhythm. Lungs: Reduced breath sounds bilaterally without wheezing, rhonchi or crackles. No increased work of breathing. Able to talk in full sentences. Abdomen: Soft, nontender, nondistended, positive bowel sounds.  Neuro: Grossly intact, nonfocal. Alert and oriented 3.  Discharge Instructions      Discharge Instructions    Call MD for:  difficulty breathing, headache or visual disturbances    Complete by:  As directed      Call MD for:  extreme fatigue    Complete by:  As directed      Call MD for:  persistant dizziness or light-headedness    Complete by:  As directed      Call MD for:  persistant nausea and vomiting    Complete by:  As directed      Call MD for:  severe uncontrolled pain    Complete by:  As directed      Call MD for:  temperature >100.4    Complete by:  As directed      Diet - low sodium heart  healthy    Complete by:  As directed      Diet Carb Modified    Complete by:  As directed      Discharge instructions    Complete by:  As directed   1) Oxygen via nasal cannula at 4 L/min continuously with oxymizer. Target SpO2 88-92%.     Increase activity slowly    Complete by:  As directed             Medication List    STOP taking these medications        aspirin 81 MG EC tablet     cephALEXin 500 MG capsule  Commonly known as:  KEFLEX     escitalopram 20 MG tablet  Commonly known as:  LEXAPRO     hydrocortisone 2.5 % rectal cream  Commonly known as:  ANUSOL-HC     lactulose 10 GM/15ML solution  Commonly known as:  CHRONULAC     mirtazapine 15 MG disintegrating tablet  Commonly known as:  REMERON SOL-TAB     propranolol 10 MG tablet  Commonly known as:  INDERAL      TAKE these medications        atorvastatin 20 MG tablet  Commonly known as:  LIPITOR  Take 1 tablet (20 mg total) by mouth daily.     diphenhydrAMINE-zinc acetate cream  Commonly known as:  BENADRYL  Apply topically 3 (three) times daily as needed for itching.     feeding supplement (ENSURE ENLIVE) Liqd  Take 237 mLs by mouth 3 (three) times daily between meals.     furosemide 40 MG tablet  Commonly known as:  LASIX  Take 1 tablet (40 mg total) by mouth daily.     metFORMIN 1000 MG tablet  Commonly known as:  GLUCOPHAGE  Take 1 tablet (1,000 mg total) by mouth 2 (two) times daily with a meal.     senna 8.6 MG tablet  Commonly known as:  SENOKOT  Take 1 tablet by mouth daily.     spironolactone 100 MG tablet  Commonly known as:  ALDACTONE  Take 1 tablet (100 mg total) by mouth daily.     traMADol 50 MG tablet  Commonly known as:  ULTRAM  Take 1 tablet (50 mg total) by mouth every 6 (six) hours as needed for moderate pain.          The results of significant diagnostics from this hospitalization (including imaging, microbiology, ancillary and laboratory) are listed below for  reference.    Significant Diagnostic Studies: Dg Chest 2 View  01/24/2015   CLINICAL DATA:  Short of breath  EXAM: CHEST  2 VIEW  COMPARISON:  11/20/2014  FINDINGS: Prominent interstitial lung markings peripherally bilaterally most consistent with chronic lung disease and scarring. Decreased lung volume as noted previously.  Negative for superimposed pneumonia.  No edema or effusion.  IMPRESSION: Chronic lung disease with scarring. No superimposed acute abnormality.   Electronically Signed   By: Franchot Gallo M.D.   On: 01/24/2015 15:34   Ct Angio Chest Pe W/cm &/or Wo Cm  01/24/2015   CLINICAL DATA:  Shortness of breath.  Weakness.  EXAM: CT ANGIOGRAPHY CHEST WITH CONTRAST  TECHNIQUE: Multidetector CT imaging of the chest was performed using the standard protocol during bolus administration of intravenous contrast. Multiplanar CT image reconstructions and MIPs were obtained to evaluate the vascular anatomy.  CONTRAST:  132mL OMNIPAQUE IOHEXOL 350 MG/ML SOLN  COMPARISON:  Chest radiographs obtained earlier today. Chest CTA dated 11/19/2014.  FINDINGS: Normally opacified pulmonary arteries with no pulmonary arterial filling defects seen. No significant change in prominence of the interstitial markings in the periphery of both lungs. Small to moderate-sized left pleural effusion with an interval decrease in size.  Moderate amount of free peritoneal fluid in the upper abdomen. The liver contours are mildly lobulated with a small right lobe and enlarged lateral segment left lobe and caudate lobe. The spleen is also diffusely enlarged. Upper abdominal varices are also demonstrated. Thoracic spine degenerative changes.  Review of the MIP images confirms the above findings.  IMPRESSION: 1. No pulmonary emboli a or acute abnormality. 2. Small to moderate-sized left pleural effusion, decreased. 3. Chronic peripheral interstitial lung disease. 4. Changes of cirrhosis of the liver with portal venous hypertension, varices  and ascites.   Electronically Signed   By: Claudie Revering M.D.   On: 01/24/2015 17:40   [place] Chest  01/28/2015   CLINICAL DATA:  Left pleural effusion  EXAM: CHEST ULTRASOUND  COMPARISON:  Chest radiograph March 28, 2015  FINDINGS: The left chest  was interrogated by ultrasound. There is a minimal left pleural effusion. There is left base atelectasis.  IMPRESSION: Minimal left effusion and left base atelectasis.   Electronically Signed   By: Lowella Grip III M.D.   On: 01/28/2015 14:28   Dg Chest Port 1 View  01/28/2015   CLINICAL DATA:  Shortness of Breath  EXAM: PORTABLE CHEST - 1 VIEW  COMPARISON:  January 25, 2015 pain August 14, 2014  FINDINGS: There is underlying interstitial fibrosis, stable. There is no frank edema or consolidation. There is a questionable minimal left pleural effusion. Heart is upper normal in size with pulmonary vascularity within normal limits. No pneumothorax. No adenopathy. There is degenerative change in each shoulder.  IMPRESSION: Areas of interstitial fibrosis bilaterally, stable. No edema or consolidation. Question of minimal left pleural effusion. No change in cardiac silhouette.   Electronically Signed   By: Lowella Grip III M.D.   On: 01/28/2015 14:59   Dg Chest Port 1 View  01/25/2015   CLINICAL DATA:  Status post left thoracentesis for pleural effusion.  EXAM: PORTABLE CHEST - 1 VIEW  COMPARISON:  CT chest and PA and lateral chest 01/24/2015.  FINDINGS: No pneumothorax is identified after thoracentesis. No pleural effusion is seen. Prominent pulmonary interstitium is unchanged. Heart size is normal.  IMPRESSION: Negative for pneumothorax after thoracentesis.  No new abnormality.   Electronically Signed   By: Inge Rise M.D.   On: 01/25/2015 11:24   Dg Toe Great Right  01/07/2015   CLINICAL DATA:  Patient with laceration to the ventral aspect of the right great toe.  EXAM: RIGHT GREAT TOE  COMPARISON:  None.  FINDINGS: There is mild irregularity about the  distal lateral aspect of proximal phalanx of the great toe. Soft tissue swelling along the ventral aspect of the great toe. No evidence for radiopaque foreign body.  IMPRESSION: Mild irregularity along distal lateral aspect of the proximal phalanx of the great toe potentially secondary to degenerative changes at this location. Nondisplaced fracture is not excluded. Recommend correlation with point tenderness.  No definite evidence for radiopaque foreign body.   Electronically Signed   By: Lovey Newcomer M.D.   On: 01/07/2015 00:36   US Thoracentesis Asp Pleural Space W/img Guide  01/25/2015   INDICATION: Symptomatic L sided pleural effusion; small effusion  EXAM: US THORACENTESIS ASP PLEURAL SPACE W/IMG GUIDE  COMPARISON:  None.  MEDICATIONS: 5 cc 1% lidocaine  COMPLICATIONS: None immediate  TECHNIQUE: Informed written consent was obtained from the patient after a discussion of the risks, benefits and alternatives to treatment. A timeout was performed prior to the initiation of the procedure.  Initial ultrasound scanning demonstrates a small left pleural effusion. The lower chest was prepped and draped in the usual sterile fashion. 1% lidocaine was used for local anesthesia.  Under direct ultrasound guidance, a 19 gauge, 7-cm, Yueh catheter was introduced. An ultrasound image was saved for documentation purposes. The thoracentesis was performed. The catheter was removed and a dressing was applied. The patient tolerated the procedure well without immediate post procedural complication. The patient was escorted to have an upright chest radiograph.  FINDINGS: A total of approximately 180 cc of bloody fluid was removed. Requested samples were sent to the laboratory.  IMPRESSION: Successful ultrasound-guided L sided thoracentesis yielding 180 cc of pleural fluid.  Read by:  Lavonia Drafts [facility]   Electronically Signed   By: Marybelle Killings M.D.   On: 01/25/2015 10:26    Microbiology: Recent  Results (from the past 240  hour(s))  Urine culture     Status: None   Collection Time: 01/24/15 11:26 PM  Result Value Ref Range Status   Specimen Description URINE, CLEAN CATCH  Final   Special Requests NONE  Final   Culture 4,000 COLONIES/mL INSIGNIFICANT GROWTH  Final   Report Status 01/26/2015 FINAL  Final  Culture, body fluid-bottle     Status: None   Collection Time: 01/25/15 10:23 AM  Result Value Ref Range Status   Specimen Description FLUID LEFT PLEURAL  Final   Special Requests NONE  Final   Culture NO GROWTH 5 DAYS  Final   Report Status 01/30/2015 FINAL  Final  Gram stain     Status: None   Collection Time: 01/25/15 10:23 AM  Result Value Ref Range Status   Specimen Description FLUID LEFT PLEURAL  Final   Special Requests NONE  Final   Gram Stain   Final    ABUNDANT WBC PRESENT, PREDOMINANTLY MONONUCLEAR NO ORGANISMS SEEN    Report Status 01/25/2015 FINAL  Final     Labs: Basic Metabolic Panel:  Recent Labs Lab 01/26/15 1050 01/27/15 0312 01/28/15 0409 01/29/15 1015 01/30/15 0351  NA 131* 132* 134* 127* 132*  K 4.0 3.9 5.1 4.7 4.6  CL 102 104 103 97* 100*  CO2 23 23 25 24 28   GLUCOSE 258* 157* 144* 204* 166*  BUN 15 12 12 16 17   CREATININE 0.96 1.05 0.94 0.97 0.98  CALCIUM 8.6* 8.2* 8.4* 8.0* 8.2*   Liver Function Tests:  Recent Labs Lab 01/24/15 1352  AST 46*  ALT 26  ALKPHOS 88  BILITOT 2.4*  PROT 7.5  ALBUMIN 2.5*   No results for input(s): LIPASE, AMYLASE in the last 168 hours. No results for input(s): AMMONIA in the last 168 hours. CBC:  Recent Labs Lab 01/24/15 1352 01/25/15 0446 01/29/15 0346  WBC 4.6 3.3* 3.5*  HGB 10.0* 9.1* 9.4*  HCT 28.8* 25.9* 27.3*  MCV 90.9 90.9 90.1  PLT 69* 48* 50*   Cardiac Enzymes:  Recent Labs Lab 01/24/15 1148 01/24/15 1352 01/24/15 1620 01/25/15 0446 01/25/15 1105  TROPONINI <0.03 <0.03 <0.03 <0.03 <0.03   BNP: BNP (last 3 results)  Recent Labs  10/04/14 1125 11/19/14 1310 01/24/15 1352  BNP 65.8 77.4  56.2    ProBNP (last 3 results) No results for input(s): PROBNP in the last 8760 hours.  CBG:  Recent Labs Lab 01/30/15 0625 01/30/15 1122 01/30/15 1651 01/30/15 2154 01/31/15 0628  GLUCAP 147* 240* 183* 273* 136*     Additional labs: 1. Cytology 01/25/2015: Diagnosis PLEURAL FLUID, LEFT (SPECIMEN 1 OF 1 COLLECTED 01/25/2015) NO MALIGNANT CELLS IDENTIFIED. REACTIVE MESOTHELIAL CELLS PRESENT. CHRONIC INFLAMMATION.   Signed:  Vernell Leep, MD, FACP, FHM. [facility] Pager [phone]  If 7PM-7AM, please contact night-coverage [email or web address] Password [email or web address] 01/31/2015, 11:20 AM

## 2015-01-31 NOTE — Progress Notes (Signed)
Chaplain Note:   Chaplain returned to finalize HCPOA with Garrett Turner.   HCPOA finalized and copies given to pt and secretary to be placed in chart.   Pt also had body donation information placed in his medical chart.   Delford Field, Chaplain 01/31/2015 10:02 AM

## 2015-01-31 NOTE — Progress Notes (Signed)
01/31/2015 1350 Report called to [place].  Pt. Transported to facility per [facility].   [place]

## 2015-07-21 DEATH — deceased

## 2016-08-11 IMAGING — DX DG CHEST 2V
2 series · 2 of 2 positions shown · non-contrast
Comparison: 08/14/2014

CLINICAL DATA: Shortness of breath, weakness, hypertension,
diabetes, cirrhosis

EXAM:
CHEST  2 VIEW

[chest pa]
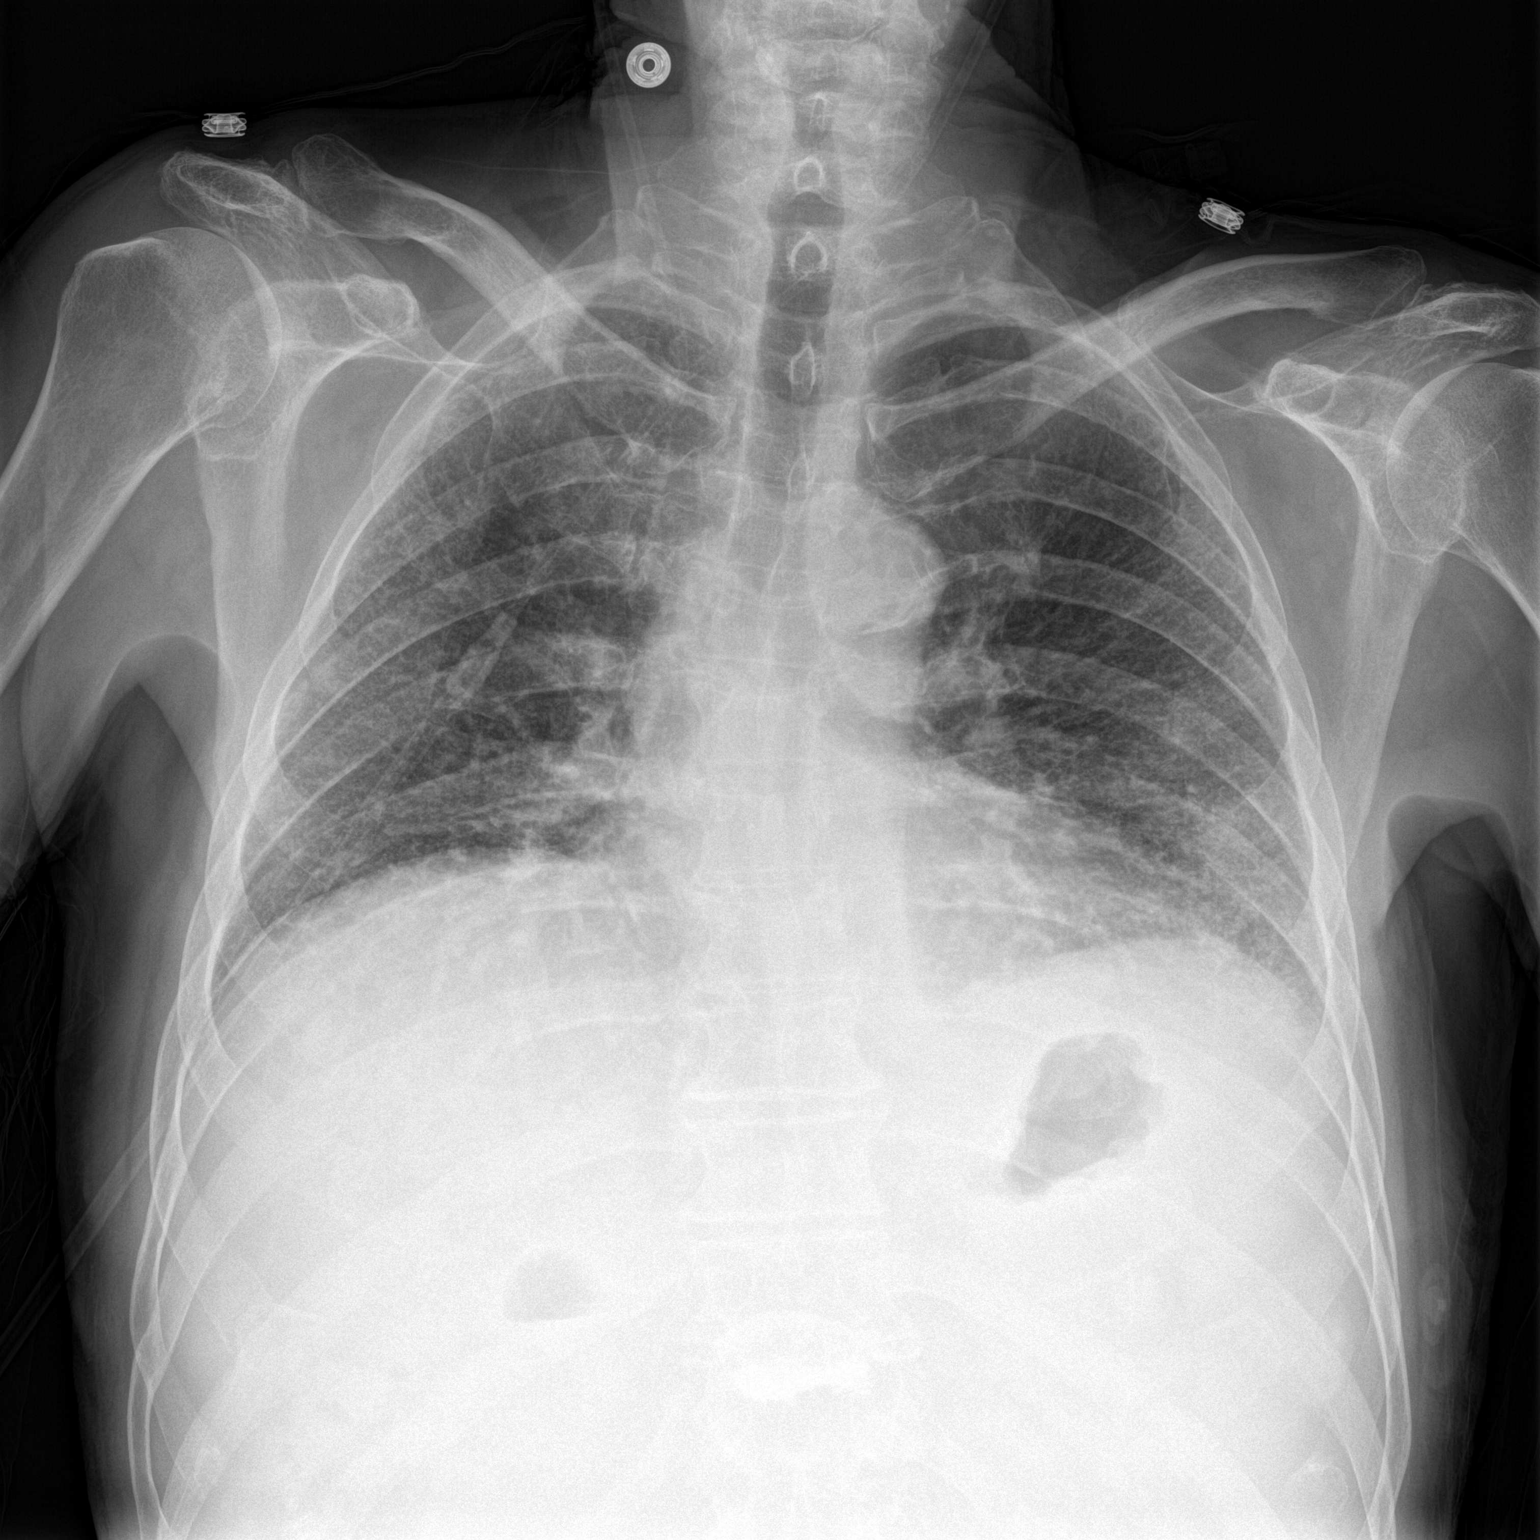

[chest lat]
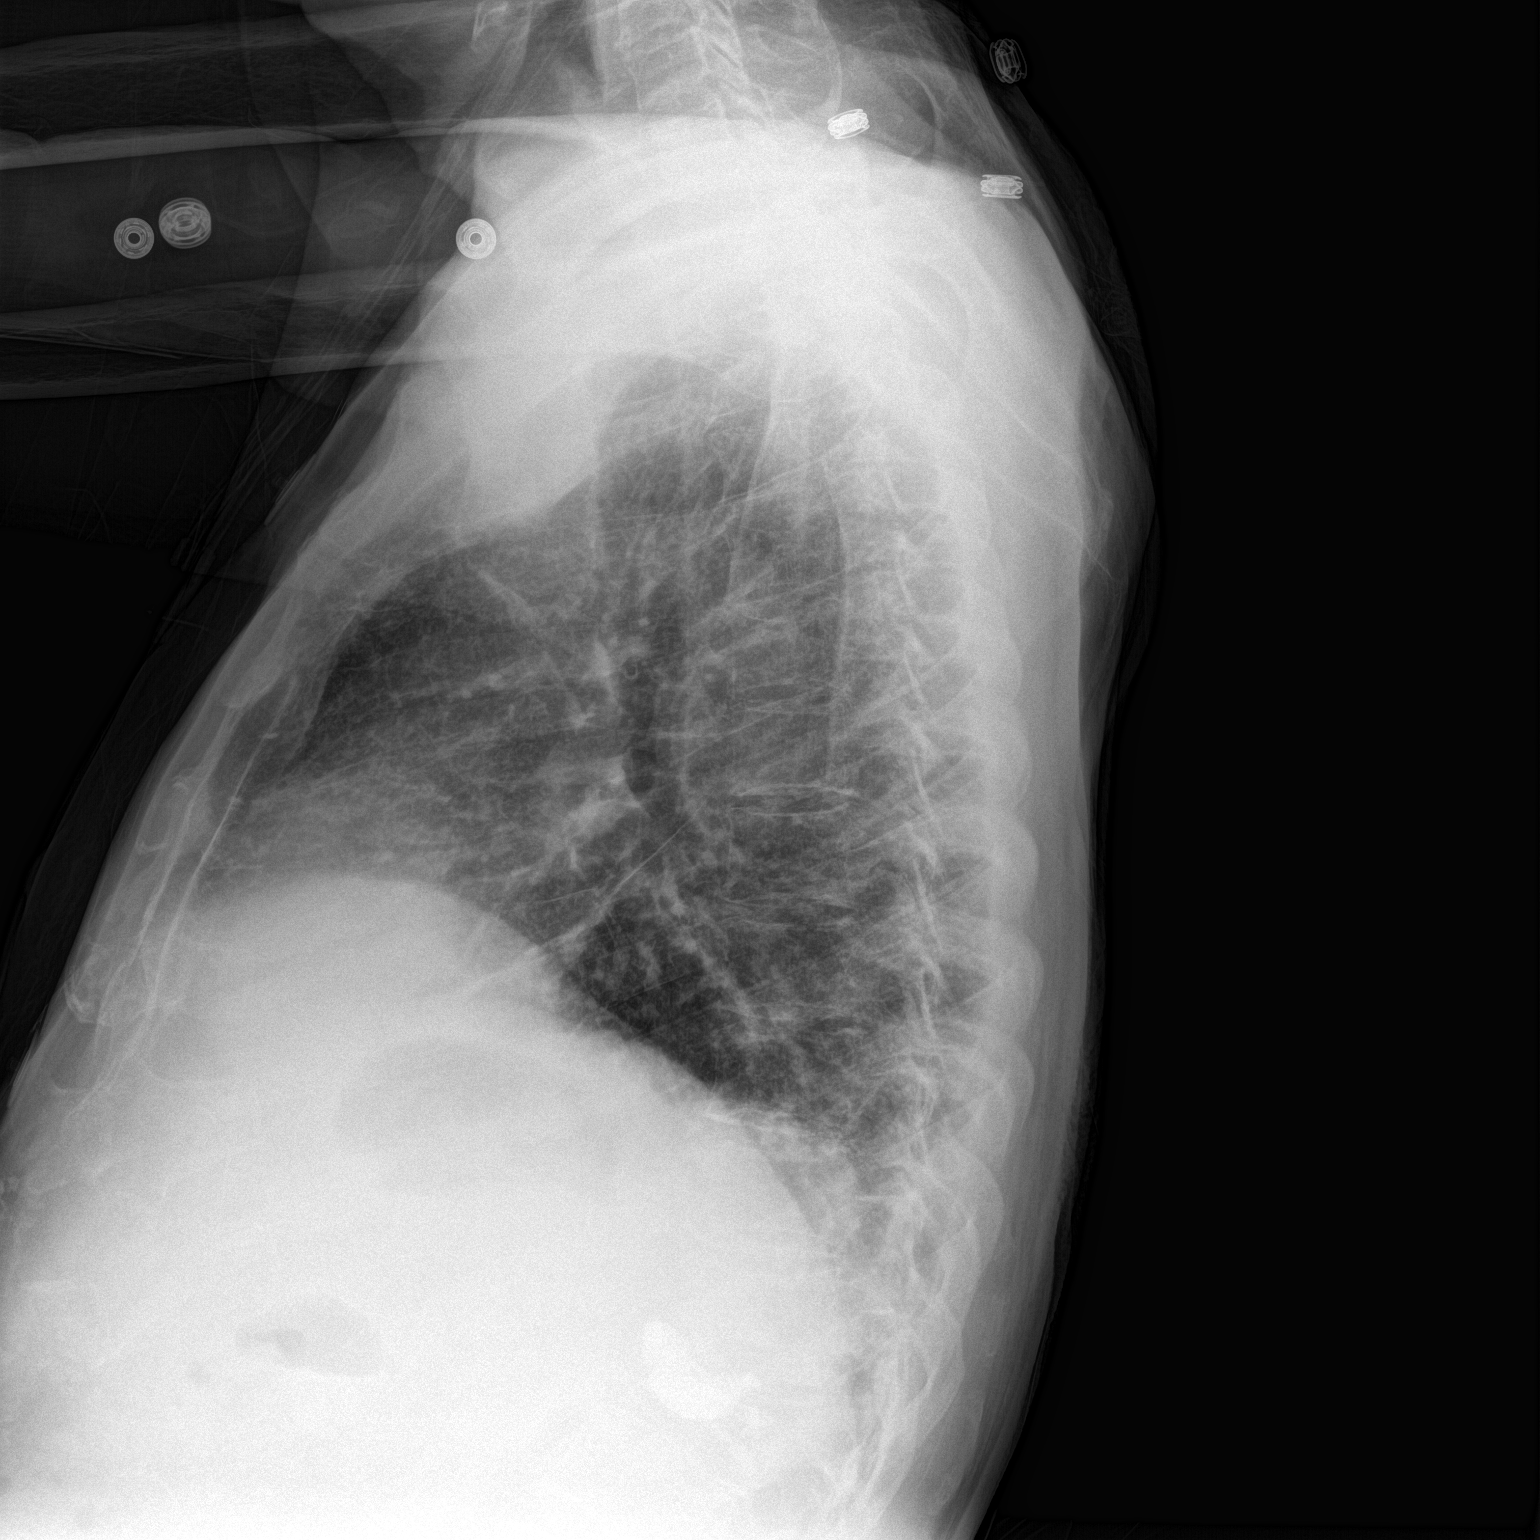

[2 of 2 positions shown; findings below may reference images not displayed]

FINDINGS: Normal heart size, mediastinal contours and pulmonary vascularity.

Atherosclerotic calcification aorta.

Decreased lung volumes with interstitial infiltrates in the mid to
lower lungs bilaterally, peripheral predominance, question infection
or edema.

No pleural effusion or pneumothorax.

Bones unremarkable.
IMPRESSION: Interstitial infiltrates bilaterally, peripheral predominance,
question infection or edema.

## 2016-08-13 IMAGING — CR DG CHEST 1V PORT
1 series · 1 of 1 positions shown · non-contrast
Comparison: CT thorax 10/04/2014

CLINICAL DATA: Pleurisy, hypertension, diabetes and pneumonia

EXAM:
PORTABLE CHEST - 1 VIEW

[AP]
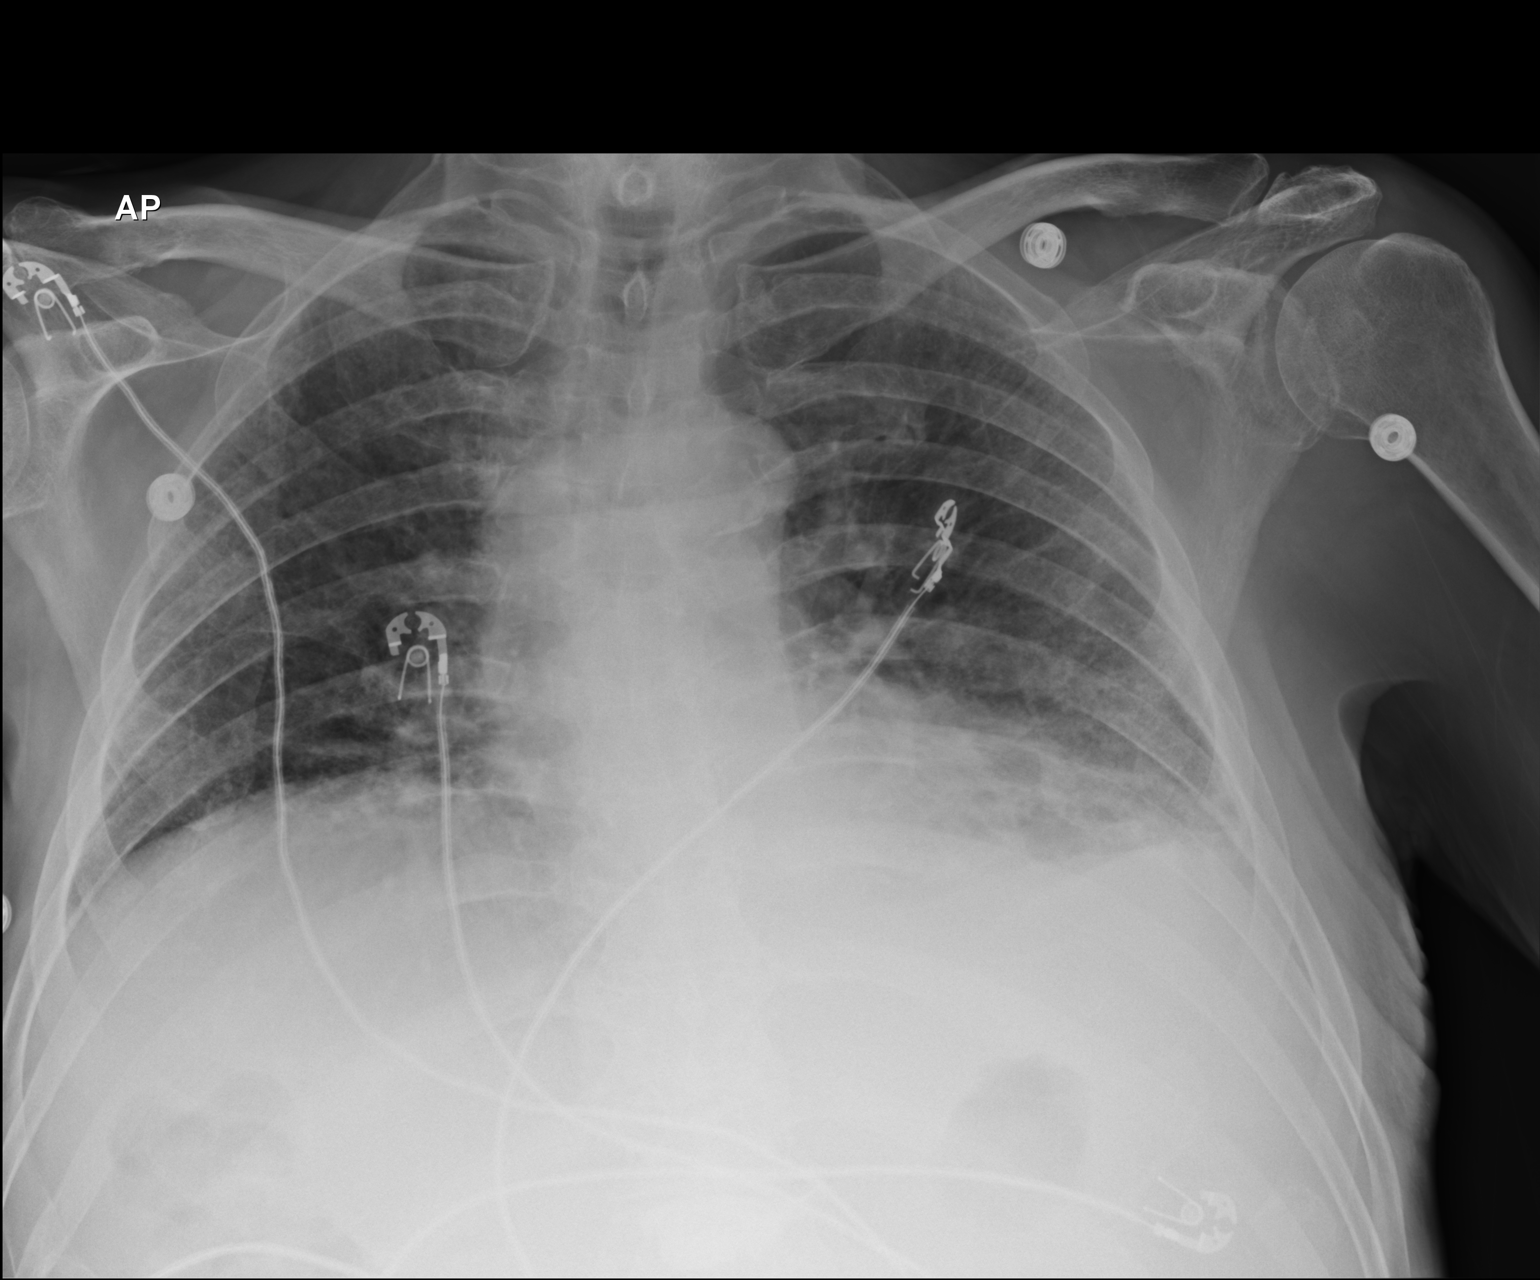

[1 of 1 positions shown; findings below may reference images not displayed]

FINDINGS: Normal cardiac silhouette. There are low lung volumes and bibasilar
atelectasis. There is a small bilateral pleural effusions. Not
changed from prior.
IMPRESSION: Low lung volumes, bibasilar atelectasis and small effusions, no
change from comparison CT.
